# Patient Record
Sex: Female | Born: 1955 | Race: White | Hispanic: No | Smoking: Never smoker
Health system: Southern US, Community
[De-identification: ages and names within clinical notes are randomized; demographics above are authoritative.]

## PROBLEM LIST (undated history)

## (undated) DIAGNOSIS — I10 Essential (primary) hypertension: Secondary | ICD-10-CM

## (undated) DIAGNOSIS — L409 Psoriasis, unspecified: Secondary | ICD-10-CM

## (undated) HISTORY — DX: Essential (primary) hypertension: I10

## (undated) HISTORY — DX: Psoriasis, unspecified: L40.9

---

## 2007-03-28 ENCOUNTER — Ambulatory Visit: Payer: Self-pay | Admitting: Family Medicine

## 2007-03-28 DIAGNOSIS — L408 Other psoriasis: Secondary | ICD-10-CM | POA: Insufficient documentation

## 2007-03-28 DIAGNOSIS — I10 Essential (primary) hypertension: Secondary | ICD-10-CM

## 2007-03-28 HISTORY — DX: Other psoriasis: L40.8

## 2007-03-29 ENCOUNTER — Ambulatory Visit: Payer: Self-pay | Admitting: Family Medicine

## 2007-04-09 LAB — CONVERTED CEMR LAB
ALT: 45 units/L — ABNORMAL HIGH (ref 0–35)
AST: 38 units/L — ABNORMAL HIGH (ref 0–37)
Albumin: 3.9 g/dL (ref 3.5–5.2)
Bilirubin, Direct: 0.2 mg/dL (ref 0.0–0.3)
Calcium: 9.4 mg/dL (ref 8.4–10.5)
Chloride: 106 meq/L (ref 96–112)
Eosinophils Absolute: 0.3 10*3/uL (ref 0.0–0.6)
Eosinophils Relative: 5.2 % — ABNORMAL HIGH (ref 0.0–5.0)
GFR calc non Af Amer: 80 mL/min
Glucose, Bld: 99 mg/dL (ref 70–99)
MCV: 75.9 fL — ABNORMAL LOW (ref 78.0–100.0)
Platelets: 214 10*3/uL (ref 150–400)
RBC: 5.1 M/uL (ref 3.87–5.11)
Total CHOL/HDL Ratio: 4.6
Triglycerides: 83 mg/dL (ref 0–149)
VLDL: 17 mg/dL (ref 0–40)
WBC: 5.3 10*3/uL (ref 4.5–10.5)

## 2007-04-10 ENCOUNTER — Encounter: Payer: Self-pay | Admitting: Family Medicine

## 2007-04-10 ENCOUNTER — Ambulatory Visit: Payer: Self-pay

## 2007-04-13 ENCOUNTER — Ambulatory Visit: Payer: Self-pay | Admitting: Family Medicine

## 2007-09-24 ENCOUNTER — Ambulatory Visit: Payer: Self-pay | Admitting: Family Medicine

## 2007-10-03 LAB — CONVERTED CEMR LAB
Albumin: 3.7 g/dL (ref 3.5–5.2)
Bilirubin, Direct: 0.1 mg/dL (ref 0.0–0.3)
Calcium: 9 mg/dL (ref 8.4–10.5)
GFR calc Af Amer: 85 mL/min
GFR calc non Af Amer: 70 mL/min
GGT: 59 units/L — ABNORMAL HIGH (ref 7–51)
HDL: 23.7 mg/dL — ABNORMAL LOW (ref >39.0)
LDL Cholesterol: 85 mg/dL (ref 0–99)
Sodium: 137 meq/L (ref 135–145)
Total Bilirubin: 0.8 mg/dL (ref 0.3–1.2)
Total CHOL/HDL Ratio: 5.7
Total Protein: 7.6 g/dL (ref 6.0–8.3)
VLDL: 25 mg/dL (ref 0–40)

## 2007-10-08 ENCOUNTER — Encounter (INDEPENDENT_AMBULATORY_CARE_PROVIDER_SITE_OTHER): Payer: Self-pay | Admitting: *Deleted

## 2007-10-11 ENCOUNTER — Ambulatory Visit: Payer: Self-pay | Admitting: Family Medicine

## 2007-10-11 LAB — CONVERTED CEMR LAB
BUN: 16 mg/dL (ref 6–23)
Calcium: 8.8 mg/dL (ref 8.4–10.5)
Chloride: 105 meq/L (ref 96–112)
Creatinine, Ser: 0.9 mg/dL (ref 0.4–1.2)
GFR calc Af Amer: 85 mL/min
GFR calc non Af Amer: 70 mL/min

## 2007-12-24 ENCOUNTER — Telehealth (INDEPENDENT_AMBULATORY_CARE_PROVIDER_SITE_OTHER): Payer: Self-pay | Admitting: *Deleted

## 2008-01-10 ENCOUNTER — Encounter (INDEPENDENT_AMBULATORY_CARE_PROVIDER_SITE_OTHER): Payer: Self-pay | Admitting: *Deleted

## 2008-01-10 ENCOUNTER — Ambulatory Visit: Payer: Self-pay | Admitting: Family Medicine

## 2008-01-10 LAB — CONVERTED CEMR LAB
CO2: 27 meq/L (ref 19–32)
Calcium: 9.2 mg/dL (ref 8.4–10.5)
Creatinine, Ser: 0.9 mg/dL (ref 0.4–1.2)
GFR calc Af Amer: 85 mL/min

## 2008-07-10 ENCOUNTER — Ambulatory Visit: Payer: Self-pay | Admitting: Family Medicine

## 2008-07-22 ENCOUNTER — Encounter (INDEPENDENT_AMBULATORY_CARE_PROVIDER_SITE_OTHER): Payer: Self-pay | Admitting: *Deleted

## 2008-07-22 LAB — CONVERTED CEMR LAB
ALT: 26 units/L (ref 0–35)
Albumin: 3.8 g/dL (ref 3.5–5.2)
BUN: 15 mg/dL (ref 6–23)
Basophils Relative: 0.6 % (ref 0.0–3.0)
CO2: 28 meq/L (ref 19–32)
Chloride: 105 meq/L (ref 96–112)
Cholesterol: 140 mg/dL (ref 0–200)
Eosinophils Relative: 6 % — ABNORMAL HIGH (ref 0.0–5.0)
HCT: 37.4 % (ref 36.0–46.0)
LDL Cholesterol: 95 mg/dL (ref 0–99)
Lymphs Abs: 1.5 10*3/uL (ref 0.7–4.0)
MCV: 77.9 fL — ABNORMAL LOW (ref 78.0–100.0)
Monocytes Absolute: 0.3 10*3/uL (ref 0.1–1.0)
Potassium: 3.9 meq/L (ref 3.5–5.1)
RBC: 4.81 M/uL (ref 3.87–5.11)
TSH: 1.99 microintl units/mL (ref 0.35–5.50)
Total Protein: 7.6 g/dL (ref 6.0–8.3)
WBC: 5.2 10*3/uL (ref 4.5–10.5)

## 2008-11-03 ENCOUNTER — Ambulatory Visit: Payer: Self-pay | Admitting: Family Medicine

## 2008-11-03 ENCOUNTER — Telehealth: Payer: Self-pay | Admitting: Internal Medicine

## 2008-11-03 DIAGNOSIS — K5289 Other specified noninfective gastroenteritis and colitis: Secondary | ICD-10-CM | POA: Insufficient documentation

## 2008-11-10 ENCOUNTER — Telehealth: Payer: Self-pay | Admitting: Family Medicine

## 2008-11-10 LAB — CONVERTED CEMR LAB
ALT: 31 units/L (ref 0–35)
AST: 36 units/L (ref 0–37)
Bilirubin, Direct: 0.3 mg/dL (ref 0.0–0.3)
Eosinophils Relative: 1 % (ref 0.0–5.0)
Monocytes Absolute: 0.2 10*3/uL (ref 0.1–1.0)
Monocytes Relative: 9 % (ref 3.0–12.0)
Neutrophils Relative %: 72 % (ref 43.0–77.0)
Platelets: 125 10*3/uL — ABNORMAL LOW (ref 150.0–400.0)
Total Bilirubin: 1.8 mg/dL — ABNORMAL HIGH (ref 0.3–1.2)
WBC: 4.5 10*3/uL (ref 4.5–10.5)

## 2009-01-09 ENCOUNTER — Ambulatory Visit: Payer: Self-pay | Admitting: Family Medicine

## 2009-01-09 DIAGNOSIS — L509 Urticaria, unspecified: Secondary | ICD-10-CM

## 2009-01-16 LAB — CONVERTED CEMR LAB
ALT: 38 units/L — ABNORMAL HIGH (ref 0–35)
Bilirubin, Direct: 0.1 mg/dL (ref 0.0–0.3)
Chloride: 103 meq/L (ref 96–112)
Creatinine, Ser: 0.9 mg/dL (ref 0.4–1.2)
GFR calc non Af Amer: 69.39 mL/min (ref >60)
LDL Cholesterol: 106 mg/dL — ABNORMAL HIGH (ref 0–99)
Total Bilirubin: 1.1 mg/dL (ref 0.3–1.2)
Total CHOL/HDL Ratio: 5
Triglycerides: 123 mg/dL (ref 0.0–149.0)

## 2010-03-01 ENCOUNTER — Telehealth (INDEPENDENT_AMBULATORY_CARE_PROVIDER_SITE_OTHER): Payer: Self-pay | Admitting: *Deleted

## 2010-03-25 NOTE — Progress Notes (Signed)
Summary: RX  Phone Note Refill Request Call back at Home Phone [phone] Message from:  Patient  Refills Requested: Medication #1:  LISINOPRIL-HYDROCHLOROTHIAZIDE 10-12.5 MG  TABS 1 by mouth once daily   Dosage confirmed as above?Dosage Confirmed   Supply Requested: 3 months [facility] [address]  Initial call taken by: Freddy Jaksch,  March 01, 2010 3:02 PM    New/Updated Medications: LISINOPRIL-HYDROCHLOROTHIAZIDE 10-12.5 MG  TABS (LISINOPRIL-HYDROCHLOROTHIAZIDE) 1 by mouth once daily** Office Visit due Now_no nore meds until seen** Prescriptions: LISINOPRIL-HYDROCHLOROTHIAZIDE 10-12.5 MG  TABS (LISINOPRIL-HYDROCHLOROTHIAZIDE) 1 by mouth once daily** Office Visit due Now_no nore meds until seen**  #30 x 0   Entered by:   Almeta Monas CMA (AAMA)   Authorized by:   Loreen Freud DO   Signed by:   Almeta Monas CMA (AAMA) on 03/01/2010   Method used:   Faxed to ...       [facility] # [id number]* (retail)       [address]       [place], [place]  [address]       Ph: [phone]       Fax: [phone]   RxID:   [id number]

## 2010-04-02 ENCOUNTER — Encounter: Payer: Self-pay | Admitting: Family Medicine

## 2010-04-02 ENCOUNTER — Ambulatory Visit (INDEPENDENT_AMBULATORY_CARE_PROVIDER_SITE_OTHER): Payer: PRIVATE HEALTH INSURANCE | Admitting: Family Medicine

## 2010-04-02 DIAGNOSIS — I10 Essential (primary) hypertension: Secondary | ICD-10-CM

## 2010-04-05 LAB — CONVERTED CEMR LAB
Bilirubin, Direct: 0.1 mg/dL (ref 0.0–0.3)
CO2: 27 meq/L (ref 19–32)
Chloride: 101 meq/L (ref 96–112)
Glucose, Bld: 83 mg/dL (ref 70–99)
LDL Cholesterol: 85 mg/dL (ref 0–99)
Potassium: 4 meq/L (ref 3.5–5.3)
Sodium: 135 meq/L (ref 135–145)
Total Bilirubin: 0.5 mg/dL (ref 0.3–1.2)
Total CHOL/HDL Ratio: 4.9
VLDL: 28 mg/dL (ref 0–40)

## 2010-04-08 NOTE — Assessment & Plan Note (Signed)
Summary: med refill   Vital Signs:  Patient profile:   55 year old female Height:      67.75 inches Weight:      269 pounds BMI:     41.35 Pulse rate:   78 / minute Pulse rhythm:   regular BP sitting:   126 / 82  (left arm) Cuff size:   large  Vitals Entered By: Army Fossa CMA (April 02, 2010 4:03 PM) CC: Med refill on Lisionpril-HCTZ. [facility]   History of Present Illness:  Hypertension follow-up      This is a 55 year old woman who presents for Hypertension follow-up.  The patient denies lightheadedness, urinary frequency, headaches, edema, impotence, rash, and fatigue.  The patient denies the following associated symptoms: chest pain, chest pressure, exercise intolerance, dyspnea, palpitations, syncope, leg edema, and pedal edema.  Compliance with medications (by patient report) has been near 100%.  The patient reports that dietary compliance has been fair.  The patient reports no exercise.  Adjunctive measures currently used by the patient include salt restriction.    Current Medications (verified): 1)  Lisinopril-Hydrochlorothiazide 10-12.5 Mg  Tabs (Lisinopril-Hydrochlorothiazide) .Marland Kitchen.. 1 By Mouth Once Daily  Allergies (verified): No Known Drug Allergies  Past History:  Past Medical History: Last updated: 03/28/2007 psoriasis Hypertension  Past Surgical History: Last updated: 03/28/2007 Denies surgical history  Family History: Last updated: 03/28/2007 Family History Diabetes 1st degree relative Family History of Colon CA 1st degree relative <60 Family History Lung cancer Family History of Stroke F 1st degree relative in 37s---  mother F- bladder CA Family History Hypertension MGF-- MI at 74 Family History Thyroid disease-- hypothyroid   Social History: Last updated: 03/28/2007 Occupation:  lab tech-- sterile med- repairing endoscopes Married-- widowed Never Smoked Alcohol use-no Drug use-no Regular exercise-no  Risk Factors: Caffeine  Use: 2 (03/28/2007) Exercise: no (03/28/2007)  Risk Factors: Smoking Status: never (03/28/2007) Passive Smoke Exposure: no (03/28/2007)  Family History: Reviewed history from 03/28/2007 and no changes required. Family History Diabetes 1st degree relative Family History of Colon CA 1st degree relative <60 Family History Lung cancer Family History of Stroke F 1st degree relative in 78s---  mother F- bladder CA Family History Hypertension MGF-- MI at 15 Family History Thyroid disease-- hypothyroid   Social History: Reviewed history from 03/28/2007 and no changes required. Occupation:  Designer, industrial/product-- sterile med- repairing endoscopes Married-- widowed Never Smoked Alcohol use-no Drug use-no Regular exercise-no  Review of Systems      See HPI  Physical Exam  General:  Well-developed,well-nourished,in no acute distress; alert,appropriate and cooperative throughout examination Lungs:  Normal respiratory effort, chest expands symmetrically. Lungs are clear to auscultation, no crackles or wheezes. Heart:  normal rate and no murmur.   Extremities:  No clubbing, cyanosis, edema, or deformity noted with normal full range of motion of all joints.   Psych:  Cognition and judgment appear intact. Alert and cooperative with normal attention span and concentration. No apparent delusions, illusions, hallucinations   Impression & Recommendations:  Problem # 1:  HYPERTENSION (ICD-401.9)  Her updated medication list for this problem includes:    Lisinopril-hydrochlorothiazide 10-12.5 Mg Tabs (Lisinopril-hydrochlorothiazide) .Marland Kitchen... 1 by mouth once daily  Orders: Venipuncture ([address]) TLB-Lipid Panel (80061-LIPID) TLB-BMP (Basic Metabolic Panel-BMET) (80048-METABOL) TLB-Hepatic/Liver Function Pnl (80076-HEPATIC)  BP today: 126/82 Prior BP: 118/80 (01/09/2009)  Labs Reviewed: K+: 4.0 (01/09/2009) Creat: : 0.9 (01/09/2009)   Chol: 161 (01/09/2009)   HDL: 30.70 (01/09/2009)   LDL: 106  (01/09/2009)   TG:  123.0 (01/09/2009)  Complete Medication List: 1)  Lisinopril-hydrochlorothiazide 10-12.5 Mg Tabs (Lisinopril-hydrochlorothiazide) .Marland Kitchen.. 1 by mouth once daily  Patient Instructions: 1)  rto cpe  Prescriptions: LISINOPRIL-HYDROCHLOROTHIAZIDE 10-12.5 MG  TABS (LISINOPRIL-HYDROCHLOROTHIAZIDE) 1 by mouth once daily  #90 x 3   Entered and Authorized by:   Loreen Freud DO   Signed by:   Loreen Freud DO on 04/02/2010   Method used:   Electronically to        [id number] (retail)       [address]       [place], [place]  [address]       Ph: [phone]       Fax: [phone]   RxID:   [id number]    Orders Added: 1)  Venipuncture [[address]] 2)  TLB-Lipid Panel [80061-LIPID] 3)  TLB-BMP (Basic Metabolic Panel-BMET) [80048-METABOL] 4)  TLB-Hepatic/Liver Function Pnl [80076-HEPATIC] 5)  Est. Patient Level III [[address]]  Appended Document: med refill

## 2011-04-01 ENCOUNTER — Other Ambulatory Visit: Payer: Self-pay

## 2011-04-01 MED ORDER — LISINOPRIL-HYDROCHLOROTHIAZIDE 10-12.5 MG PO TABS: 1.0000 | ORAL_TABLET | Freq: Every day | ORAL | Status: DC

## 2011-04-01 NOTE — Telephone Encounter (Signed)
Apt scheduled    KP 

## 2011-05-11 ENCOUNTER — Ambulatory Visit (INDEPENDENT_AMBULATORY_CARE_PROVIDER_SITE_OTHER): Payer: PRIVATE HEALTH INSURANCE | Admitting: Family Medicine

## 2011-05-11 ENCOUNTER — Encounter: Payer: Self-pay | Admitting: Family Medicine

## 2011-05-11 VITALS — BP 120/86 | HR 79 | Temp 97.7°F | Ht 66.25 in | Wt 275.0 lb

## 2011-05-11 DIAGNOSIS — M1990 Unspecified osteoarthritis, unspecified site: Secondary | ICD-10-CM

## 2011-05-11 DIAGNOSIS — I10 Essential (primary) hypertension: Secondary | ICD-10-CM

## 2011-05-11 DIAGNOSIS — Z0000 Encounter for general adult medical examination without abnormal findings: Secondary | ICD-10-CM

## 2011-05-11 DIAGNOSIS — R319 Hematuria, unspecified: Secondary | ICD-10-CM

## 2011-05-11 LAB — BASIC METABOLIC PANEL
BUN: 19 mg/dL (ref 6–23)
CO2: 24 mEq/L (ref 19–32)
Calcium: 8.8 mg/dL (ref 8.4–10.5)
Chloride: 101 mEq/L (ref 96–112)
Creatinine, Ser: 0.8 mg/dL (ref 0.4–1.2)
Glucose, Bld: 123 mg/dL — ABNORMAL HIGH (ref 70–99)

## 2011-05-11 LAB — CBC WITH DIFFERENTIAL/PLATELET
Basophils Absolute: 0 10*3/uL (ref 0.0–0.1)
Basophils Relative: 0.5 % (ref 0.0–3.0)
Eosinophils Absolute: 0.4 10*3/uL (ref 0.0–0.7)
Lymphocytes Relative: 21.3 % (ref 12.0–46.0)
MCHC: 32.7 g/dL (ref 30.0–36.0)
MCV: 73.1 fl — ABNORMAL LOW (ref 78.0–100.0)
Monocytes Absolute: 0.3 10*3/uL (ref 0.1–1.0)
Neutrophils Relative %: 61.8 % (ref 43.0–77.0)
Platelets: 197 10*3/uL (ref 150.0–400.0)
RDW: 19.2 % — ABNORMAL HIGH (ref 11.5–14.6)

## 2011-05-11 LAB — POCT URINALYSIS DIPSTICK
Bilirubin, UA: NEGATIVE
Nitrite, UA: NEGATIVE
Protein, UA: NEGATIVE
pH, UA: 6

## 2011-05-11 LAB — LIPID PANEL
HDL: 37.8 mg/dL — ABNORMAL LOW (ref >39.00)
Total CHOL/HDL Ratio: 4
Triglycerides: 72 mg/dL (ref 0.0–149.0)
VLDL: 14.4 mg/dL (ref 0.0–40.0)

## 2011-05-11 LAB — HEPATIC FUNCTION PANEL
Alkaline Phosphatase: 102 U/L (ref 39–117)
Bilirubin, Direct: 0.1 mg/dL (ref 0.0–0.3)
Total Bilirubin: 0.5 mg/dL (ref 0.3–1.2)
Total Protein: 8 g/dL (ref 6.0–8.3)

## 2011-05-11 MED ORDER — DICLOFENAC SODIUM 1.5 % TD SOLN: 40.0000 [drp] | Freq: Four times a day (QID) | TRANSDERMAL | Status: DC | PRN

## 2011-05-11 MED ORDER — LISINOPRIL-HYDROCHLOROTHIAZIDE 10-12.5 MG PO TABS
1.0000 | ORAL_TABLET | Freq: Every day | ORAL | Status: DC
Start: 2011-05-11 — End: 2012-05-13

## 2011-05-11 NOTE — Progress Notes (Signed)
Addended by: Silvio Pate D on: 05/11/2011 12:03 PM   Modules accepted: Orders

## 2011-05-11 NOTE — Patient Instructions (Addendum)
Preventive Care for Adults, Female A healthy lifestyle and preventive care can promote health and wellness. Preventive health guidelines for women include the following key practices.  A routine yearly physical is a good way to check with your caregiver about your health and preventive screening. It is a chance to share any concerns and updates on your health, and to receive a thorough exam.   Visit your dentist for a routine exam and preventive care every 6 months. Brush your teeth twice a day and floss once a day. Good oral hygiene prevents tooth decay and gum disease.   The frequency of eye exams is based on your age, health, family medical history, use of contact lenses, and other factors. Follow your caregiver's recommendations for frequency of eye exams.   Eat a healthy diet. Foods like vegetables, fruits, whole grains, low-fat dairy products, and lean protein foods contain the nutrients you need without too many calories. Decrease your intake of foods high in solid fats, added sugars, and salt. Eat the right amount of calories for you.Get information about a proper diet from your caregiver, if necessary.   Regular physical exercise is one of the most important things you can do for your health. Most adults should get at least 150 minutes of moderate-intensity exercise (any activity that increases your heart rate and causes you to sweat) each week. In addition, most adults need muscle-strengthening exercises on 2 or more days a week.   Maintain a healthy weight. The body mass index (BMI) is a screening tool to identify possible weight problems. It provides an estimate of body fat based on height and weight. Your caregiver can help determine your BMI, and can help you achieve or maintain a healthy weight.For adults 20 years and older:   A BMI below 18.5 is considered underweight.   A BMI of 18.5 to 24.9 is normal.   A BMI of 25 to 29.9 is considered overweight.   A BMI of 30 and above is  considered obese.   Maintain normal blood lipids and cholesterol levels by exercising and minimizing your intake of saturated fat. Eat a balanced diet with plenty of fruit and vegetables. Blood tests for lipids and cholesterol should begin at age 20 and be repeated every 5 years. If your lipid or cholesterol levels are high, you are over 50, or you are at high risk for heart disease, you may need your cholesterol levels checked more frequently.Ongoing high lipid and cholesterol levels should be treated with medicines if diet and exercise are not effective.   If you smoke, find out from your caregiver how to quit. If you do not use tobacco, do not start.   If you are pregnant, do not drink alcohol. If you are breastfeeding, be very cautious about drinking alcohol. If you are not pregnant and choose to drink alcohol, do not exceed 1 drink per day. One drink is considered to be 12 ounces (355 mL) of beer, 5 ounces (148 mL) of wine, or 1.5 ounces (44 mL) of liquor.   Avoid use of street drugs. Do not share needles with anyone. Ask for help if you need support or instructions about stopping the use of drugs.   High blood pressure causes heart disease and increases the risk of stroke. Your blood pressure should be checked at least every 1 to 2 years. Ongoing high blood pressure should be treated with medicines if weight loss and exercise are not effective.   If you are 55 to 56   years old, ask your caregiver if you should take aspirin to prevent strokes.   Diabetes screening involves taking a blood sample to check your fasting blood sugar level. This should be done once every 3 years, after age 45, if you are within normal weight and without risk factors for diabetes. Testing should be considered at a younger age or be carried out more frequently if you are overweight and have at least 1 risk factor for diabetes.   Breast cancer screening is essential preventive care for women. You should practice "breast  self-awareness." This means understanding the normal appearance and feel of your breasts and may include breast self-examination. Any changes detected, no matter how small, should be reported to a caregiver. Women in their 20s and 30s should have a clinical breast exam (CBE) by a caregiver as part of a regular health exam every 1 to 3 years. After age 40, women should have a CBE every year. Starting at age 40, women should consider having a mammography (breast X-ray test) every year. Women who have a family history of breast cancer should talk to their caregiver about genetic screening. Women at a high risk of breast cancer should talk to their caregivers about having magnetic resonance imaging (MRI) and a mammography every year.   The Pap test is a screening test for cervical cancer. A Pap test can show cell changes on the cervix that might become cervical cancer if left untreated. A Pap test is a procedure in which cells are obtained and examined from the lower end of the uterus (cervix).   Women should have a Pap test starting at age 21.   Between ages 21 and 29, Pap tests should be repeated every 2 years.   Beginning at age 30, you should have a Pap test every 3 years as long as the past 3 Pap tests have been normal.   Some women have medical problems that increase the chance of getting cervical cancer. Talk to your caregiver about these problems. It is especially important to talk to your caregiver if a new problem develops soon after your last Pap test. In these cases, your caregiver may recommend more frequent screening and Pap tests.   The above recommendations are the same for women who have or have not gotten the vaccine for human papillomavirus (HPV).   If you had a hysterectomy for a problem that was not cancer or a condition that could lead to cancer, then you no longer need Pap tests. Even if you no longer need a Pap test, a regular exam is a good idea to make sure no other problems are  starting.   If you are between ages 65 and 70, and you have had normal Pap tests going back 10 years, you no longer need Pap tests. Even if you no longer need a Pap test, a regular exam is a good idea to make sure no other problems are starting.   If you have had past treatment for cervical cancer or a condition that could lead to cancer, you need Pap tests and screening for cancer for at least 20 years after your treatment.   If Pap tests have been discontinued, risk factors (such as a new sexual partner) need to be reassessed to determine if screening should be resumed.   The HPV test is an additional test that may be used for cervical cancer screening. The HPV test looks for the virus that can cause the cell changes on the cervix.   The cells collected during the Pap test can be tested for HPV. The HPV test could be used to screen women aged 30 years and older, and should be used in women of any age who have unclear Pap test results. After the age of 30, women should have HPV testing at the same frequency as a Pap test.   Colorectal cancer can be detected and often prevented. Most routine colorectal cancer screening begins at the age of 50 and continues through age 75. However, your caregiver may recommend screening at an earlier age if you have risk factors for colon cancer. On a yearly basis, your caregiver may provide home test kits to check for hidden blood in the stool. Use of a small camera at the end of a tube, to directly examine the colon (sigmoidoscopy or colonoscopy), can detect the earliest forms of colorectal cancer. Talk to your caregiver about this at age 50, when routine screening begins. Direct examination of the colon should be repeated every 5 to 10 years through age 75, unless early forms of pre-cancerous polyps or small growths are found.   Hepatitis C blood testing is recommended for all people born from 1945 through 1965 and any individual with known risks for hepatitis C.    Practice safe sex. Use condoms and avoid high-risk sexual practices to reduce the spread of sexually transmitted infections (STIs). STIs include gonorrhea, chlamydia, syphilis, trichomonas, herpes, HPV, and human immunodeficiency virus (HIV). Herpes, HIV, and HPV are viral illnesses that have no cure. They can result in disability, cancer, and death. Sexually active women aged 25 and younger should be checked for chlamydia. Older women with new or multiple partners should also be tested for chlamydia. Testing for other STIs is recommended if you are sexually active and at increased risk.   Osteoporosis is a disease in which the bones lose minerals and strength with aging. This can result in serious bone fractures. The risk of osteoporosis can be identified using a bone density scan. Women ages 65 and over and women at risk for fractures or osteoporosis should discuss screening with their caregivers. Ask your caregiver whether you should take a calcium supplement or vitamin D to reduce the rate of osteoporosis.   Menopause can be associated with physical symptoms and risks. Hormone replacement therapy is available to decrease symptoms and risks. You should talk to your caregiver about whether hormone replacement therapy is right for you.   Use sunscreen with sun protection factor (SPF) of 30 or more. Apply sunscreen liberally and repeatedly throughout the day. You should seek shade when your shadow is shorter than you. Protect yourself by wearing long sleeves, pants, a wide-brimmed hat, and sunglasses year round, whenever you are outdoors.   Once a month, do a whole body skin exam, using a mirror to look at the skin on your back. Notify your caregiver of new moles, moles that have irregular borders, moles that are larger than a pencil eraser, or moles that have changed in shape or color.   Stay current with required immunizations.   Influenza. You need a dose every fall (or winter). The composition of  the flu vaccine changes each year, so being vaccinated once is not enough.   Pneumococcal polysaccharide. You need 1 to 2 doses if you smoke cigarettes or if you have certain chronic medical conditions. You need 1 dose at age 65 (or older) if you have never been vaccinated.   Tetanus, diphtheria, pertussis (Tdap, Td). Get 1 dose of   Tdap vaccine if you are younger than age 65, are over 65 and have contact with an infant, are a healthcare worker, are pregnant, or simply want to be protected from whooping cough. After that, you need a Td booster dose every 10 years. Consult your caregiver if you have not had at least 3 tetanus and diphtheria-containing shots sometime in your life or have a deep or dirty wound.   HPV. You need this vaccine if you are a woman age 26 or younger. The vaccine is given in 3 doses over 6 months.   Measles, mumps, rubella (MMR). You need at least 1 dose of MMR if you were born in 1957 or later. You may also need a second dose.   Meningococcal. If you are age 19 to 21 and a first-year college student living in a residence hall, or have one of several medical conditions, you need to get vaccinated against meningococcal disease. You may also need additional booster doses.   Zoster (shingles). If you are age 60 or older, you should get this vaccine.   Varicella (chickenpox). If you have never had chickenpox or you were vaccinated but received only 1 dose, talk to your caregiver to find out if you need this vaccine.   Hepatitis A. You need this vaccine if you have a specific risk factor for hepatitis A virus infection or you simply wish to be protected from this disease. The vaccine is usually given as 2 doses, 6 to 18 months apart.   Hepatitis B. You need this vaccine if you have a specific risk factor for hepatitis B virus infection or you simply wish to be protected from this disease. The vaccine is given in 3 doses, usually over 6 months.  Preventive Services /  Frequency Ages 19 to 39  Blood pressure check.** / Every 1 to 2 years.   Lipid and cholesterol check.** / Every 5 years beginning at age 20.   Clinical breast exam.** / Every 3 years for women in their 20s and 30s.   Pap test.** / Every 2 years from ages 21 through 29. Every 3 years starting at age 30 through age 65 or 70 with a history of 3 consecutive normal Pap tests.   HPV screening.** / Every 3 years from ages 30 through ages 65 to 70 with a history of 3 consecutive normal Pap tests.   Hepatitis C blood test.** / For any individual with known risks for hepatitis C.   Skin self-exam. / Monthly.   Influenza immunization.** / Every year.   Pneumococcal polysaccharide immunization.** / 1 to 2 doses if you smoke cigarettes or if you have certain chronic medical conditions.   Tetanus, diphtheria, pertussis (Tdap, Td) immunization. / A one-time dose of Tdap vaccine. After that, you need a Td booster dose every 10 years.   HPV immunization. / 3 doses over 6 months, if you are 26 and younger.   Measles, mumps, rubella (MMR) immunization. / You need at least 1 dose of MMR if you were born in 1957 or later. You may also need a second dose.   Meningococcal immunization. / 1 dose if you are age 19 to 21 and a first-year college student living in a residence hall, or have one of several medical conditions, you need to get vaccinated against meningococcal disease. You may also need additional booster doses.   Varicella immunization.** / Consult your caregiver.   Hepatitis A immunization.** / Consult your caregiver. 2 doses, 6 to 18 months   apart.   Hepatitis B immunization.** / Consult your caregiver. 3 doses usually over 6 months.  Ages 40 to 64  Blood pressure check.** / Every 1 to 2 years.   Lipid and cholesterol check.** / Every 5 years beginning at age 20.   Clinical breast exam.** / Every year after age 40.   Mammogram.** / Every year beginning at age 40 and continuing for as  long as you are in good health. Consult with your caregiver.   Pap test.** / Every 3 years starting at age 30 through age 65 or 70 with a history of 3 consecutive normal Pap tests.   HPV screening.** / Every 3 years from ages 30 through ages 65 to 70 with a history of 3 consecutive normal Pap tests.   Fecal occult blood test (FOBT) of stool. / Every year beginning at age 50 and continuing until age 75. You may not need to do this test if you get a colonoscopy every 10 years.   Flexible sigmoidoscopy or colonoscopy.** / Every 5 years for a flexible sigmoidoscopy or every 10 years for a colonoscopy beginning at age 50 and continuing until age 75.   Hepatitis C blood test.** / For all people born from 1945 through 1965 and any individual with known risks for hepatitis C.   Skin self-exam. / Monthly.   Influenza immunization.** / Every year.   Pneumococcal polysaccharide immunization.** / 1 to 2 doses if you smoke cigarettes or if you have certain chronic medical conditions.   Tetanus, diphtheria, pertussis (Tdap, Td) immunization.** / A one-time dose of Tdap vaccine. After that, you need a Td booster dose every 10 years.   Measles, mumps, rubella (MMR) immunization. / You need at least 1 dose of MMR if you were born in 1957 or later. You may also need a second dose.   Varicella immunization.** / Consult your caregiver.   Meningococcal immunization.** / Consult your caregiver.   Hepatitis A immunization.** / Consult your caregiver. 2 doses, 6 to 18 months apart.   Hepatitis B immunization.** / Consult your caregiver. 3 doses, usually over 6 months.  Ages 65 and over  Blood pressure check.** / Every 1 to 2 years.   Lipid and cholesterol check.** / Every 5 years beginning at age 20.   Clinical breast exam.** / Every year after age 40.   Mammogram.** / Every year beginning at age 40 and continuing for as long as you are in good health. Consult with your caregiver.   Pap test.** /  Every 3 years starting at age 30 through age 65 or 70 with a 3 consecutive normal Pap tests. Testing can be stopped between 65 and 70 with 3 consecutive normal Pap tests and no abnormal Pap or HPV tests in the past 10 years.   HPV screening.** / Every 3 years from ages 30 through ages 65 or 70 with a history of 3 consecutive normal Pap tests. Testing can be stopped between 65 and 70 with 3 consecutive normal Pap tests and no abnormal Pap or HPV tests in the past 10 years.   Fecal occult blood test (FOBT) of stool. / Every year beginning at age 50 and continuing until age 75. You may not need to do this test if you get a colonoscopy every 10 years.   Flexible sigmoidoscopy or colonoscopy.** / Every 5 years for a flexible sigmoidoscopy or every 10 years for a colonoscopy beginning at age 50 and continuing until age 75.   Hepatitis   C blood test.** / For all people born from [birth year] through 1965 and any individual with known risks for hepatitis C.   Osteoporosis screening.** / A one-time screening for women ages 88 and over and women at risk for fractures or osteoporosis.   Skin self-exam. / Monthly.   Influenza immunization.** / Every year.   Pneumococcal polysaccharide immunization.** / 1 dose at age 45 (or older) if you have never been vaccinated.   Tetanus, diphtheria, pertussis (Tdap, Td) immunization. / A one-time dose of Tdap vaccine if you are over 65 and have contact with an infant, are a Research scientist (physical sciences), or simply want to be protected from whooping cough. After that, you need a Td booster dose every 10 years.   Varicella immunization.** / Consult your caregiver.   Meningococcal immunization.** / Consult your caregiver.   Hepatitis A immunization.** / Consult your caregiver. 2 doses, 6 to 18 months apart.   Hepatitis B immunization.** / Check with your caregiver. 3 doses, usually over 6 months.  ** Family history and personal history of risk and conditions may change your caregiver's  recommendations. Document Released: 04/05/2001 Document Revised: 01/27/2011 Document Reviewed: 07/05/2010 [facility] Patient Information 2012 [place], [place].Preventive Care for Adults, Female A healthy lifestyle and preventive care can promote health and wellness. Preventive health guidelines for women include the following key practices.  A routine yearly physical is a good way to check with your caregiver about your health and preventive screening. It is a chance to share any concerns and updates on your health, and to receive a thorough exam.   Visit your dentist for a routine exam and preventive care every 6 months. Brush your teeth twice a day and floss once a day. Good oral hygiene prevents tooth decay and gum disease.   The frequency of eye exams is based on your age, health, family medical history, use of contact lenses, and other factors. Follow your caregiver's recommendations for frequency of eye exams.   Eat a healthy diet. Foods like vegetables, fruits, whole grains, low-fat dairy products, and lean protein foods contain the nutrients you need without too many calories. Decrease your intake of foods high in solid fats, added sugars, and salt. Eat the right amount of calories for you.Get information about a proper diet from your caregiver, if necessary.   Regular physical exercise is one of the most important things you can do for your health. Most adults should get at least 150 minutes of moderate-intensity exercise (any activity that increases your heart rate and causes you to sweat) each week. In addition, most adults need muscle-strengthening exercises on 2 or more days a week.   Maintain a healthy weight. The body mass index (BMI) is a screening tool to identify possible weight problems. It provides an estimate of body fat based on height and weight. Your caregiver can help determine your BMI, and can help you achieve or maintain a healthy weight.For adults 20 years and older:   A  BMI below 18.5 is considered underweight.   A BMI of 18.5 to 24.9 is normal.   A BMI of 25 to 29.9 is considered overweight.   A BMI of 30 and above is considered obese.   Maintain normal blood lipids and cholesterol levels by exercising and minimizing your intake of saturated fat. Eat a balanced diet with plenty of fruit and vegetables. Blood tests for lipids and cholesterol should begin at age 58 and be repeated every 5 years. If your lipid or cholesterol levels are  high, you are over 50, or you are at high risk for heart disease, you may need your cholesterol levels checked more frequently.Ongoing high lipid and cholesterol levels should be treated with medicines if diet and exercise are not effective.   If you smoke, find out from your caregiver how to quit. If you do not use tobacco, do not start.   If you are pregnant, do not drink alcohol. If you are breastfeeding, be very cautious about drinking alcohol. If you are not pregnant and choose to drink alcohol, do not exceed 1 drink per day. One drink is considered to be 12 ounces (355 mL) of beer, 5 ounces (148 mL) of wine, or 1.5 ounces (44 mL) of liquor.   Avoid use of street drugs. Do not share needles with anyone. Ask for help if you need support or instructions about stopping the use of drugs.   High blood pressure causes heart disease and increases the risk of stroke. Your blood pressure should be checked at least every 1 to 2 years. Ongoing high blood pressure should be treated with medicines if weight loss and exercise are not effective.   If you are 35 to 56 years old, ask your caregiver if you should take aspirin to prevent strokes.   Diabetes screening involves taking a blood sample to check your fasting blood sugar level. This should be done once every 3 years, after age 43, if you are within normal weight and without risk factors for diabetes. Testing should be considered at a younger age or be carried out more frequently if you  are overweight and have at least 1 risk factor for diabetes.   Breast cancer screening is essential preventive care for women. You should practice "breast self-awareness." This means understanding the normal appearance and feel of your breasts and may include breast self-examination. Any changes detected, no matter how small, should be reported to a caregiver. Women in their 49s and 30s should have a clinical breast exam (CBE) by a caregiver as part of a regular health exam every 1 to 3 years. After age 3, women should have a CBE every year. Starting at age 12, women should consider having a mammography (breast X-ray test) every year. Women who have a family history of breast cancer should talk to their caregiver about genetic screening. Women at a high risk of breast cancer should talk to their caregivers about having magnetic resonance imaging (MRI) and a mammography every year.   The Pap test is a screening test for cervical cancer. A Pap test can show cell changes on the cervix that might become cervical cancer if left untreated. A Pap test is a procedure in which cells are obtained and examined from the lower end of the uterus (cervix).   Women should have a Pap test starting at age 60.   Between ages 86 and 61, Pap tests should be repeated every 2 years.   Beginning at age 36, you should have a Pap test every 3 years as long as the past 3 Pap tests have been normal.   Some women have medical problems that increase the chance of getting cervical cancer. Talk to your caregiver about these problems. It is especially important to talk to your caregiver if a new problem develops soon after your last Pap test. In these cases, your caregiver may recommend more frequent screening and Pap tests.   The above recommendations are the same for women who have or have not gotten the vaccine  for human papillomavirus (HPV).   If you had a hysterectomy for a problem that was not cancer or a condition that could  lead to cancer, then you no longer need Pap tests. Even if you no longer need a Pap test, a regular exam is a good idea to make sure no other problems are starting.   If you are between ages 29 and 48, and you have had normal Pap tests going back 10 years, you no longer need Pap tests. Even if you no longer need a Pap test, a regular exam is a good idea to make sure no other problems are starting.   If you have had past treatment for cervical cancer or a condition that could lead to cancer, you need Pap tests and screening for cancer for at least 20 years after your treatment.   If Pap tests have been discontinued, risk factors (such as a new sexual partner) need to be reassessed to determine if screening should be resumed.   The HPV test is an additional test that may be used for cervical cancer screening. The HPV test looks for the virus that can cause the cell changes on the cervix. The cells collected during the Pap test can be tested for HPV. The HPV test could be used to screen women aged 68 years and older, and should be used in women of any age who have unclear Pap test results. After the age of 32, women should have HPV testing at the same frequency as a Pap test.   Colorectal cancer can be detected and often prevented. Most routine colorectal cancer screening begins at the age of 52 and continues through age 4. However, your caregiver may recommend screening at an earlier age if you have risk factors for colon cancer. On a yearly basis, your caregiver may provide home test kits to check for hidden blood in the stool. Use of a small camera at the end of a tube, to directly examine the colon (sigmoidoscopy or colonoscopy), can detect the earliest forms of colorectal cancer. Talk to your caregiver about this at age 53, when routine screening begins. Direct examination of the colon should be repeated every 5 to 10 years through age 52, unless early forms of pre-cancerous polyps or small growths are  found.   Hepatitis C blood testing is recommended for all people born from [birth year] through 1965 and any individual with known risks for hepatitis C.   Practice safe sex. Use condoms and avoid high-risk sexual practices to reduce the spread of sexually transmitted infections (STIs). STIs include gonorrhea, chlamydia, syphilis, trichomonas, herpes, HPV, and human immunodeficiency virus (HIV). Herpes, HIV, and HPV are viral illnesses that have no cure. They can result in disability, cancer, and death. Sexually active women aged 45 and younger should be checked for chlamydia. Older women with new or multiple partners should also be tested for chlamydia. Testing for other STIs is recommended if you are sexually active and at increased risk.   Osteoporosis is a disease in which the bones lose minerals and strength with aging. This can result in serious bone fractures. The risk of osteoporosis can be identified using a bone density scan. Women ages 50 and over and women at risk for fractures or osteoporosis should discuss screening with their caregivers. Ask your caregiver whether you should take a calcium supplement or vitamin D to reduce the rate of osteoporosis.   Menopause can be associated with physical symptoms and risks. Hormone replacement therapy is  available to decrease symptoms and risks. You should talk to your caregiver about whether hormone replacement therapy is right for you.   Use sunscreen with sun protection factor (SPF) of 30 or more. Apply sunscreen liberally and repeatedly throughout the day. You should seek shade when your shadow is shorter than you. Protect yourself by wearing long sleeves, pants, a wide-brimmed hat, and sunglasses year round, whenever you are outdoors.   Once a month, do a whole body skin exam, using a mirror to look at the skin on your back. Notify your caregiver of new moles, moles that have irregular borders, moles that are larger than a pencil eraser, or moles that  have changed in shape or color.   Stay current with required immunizations.   Influenza. You need a dose every fall (or winter). The composition of the flu vaccine changes each year, so being vaccinated once is not enough.   Pneumococcal polysaccharide. You need 1 to 2 doses if you smoke cigarettes or if you have certain chronic medical conditions. You need 1 dose at age 35 (or older) if you have never been vaccinated.   Tetanus, diphtheria, pertussis (Tdap, Td). Get 1 dose of Tdap vaccine if you are younger than age 6, are over 67 and have contact with an infant, are a Research scientist (physical sciences), are pregnant, or simply want to be protected from whooping cough. After that, you need a Td booster dose every 10 years. Consult your caregiver if you have not had at least 3 tetanus and diphtheria-containing shots sometime in your life or have a deep or dirty wound.   HPV. You need this vaccine if you are a woman age 4 or younger. The vaccine is given in 3 doses over 6 months.   Measles, mumps, rubella (MMR). You need at least 1 dose of MMR if you were born in 1957 or later. You may also need a second dose.   Meningococcal. If you are age 51 to 24 and a first-year college student living in a residence hall, or have one of several medical conditions, you need to get vaccinated against meningococcal disease. You may also need additional booster doses.   Zoster (shingles). If you are age 54 or older, you should get this vaccine.   Varicella (chickenpox). If you have never had chickenpox or you were vaccinated but received only 1 dose, talk to your caregiver to find out if you need this vaccine.   Hepatitis A. You need this vaccine if you have a specific risk factor for hepatitis A virus infection or you simply wish to be protected from this disease. The vaccine is usually given as 2 doses, 6 to 18 months apart.   Hepatitis B. You need this vaccine if you have a specific risk factor for hepatitis B virus  infection or you simply wish to be protected from this disease. The vaccine is given in 3 doses, usually over 6 months.  Preventive Services / Frequency Ages 71 to 18  Blood pressure check.** / Every 1 to 2 years.   Lipid and cholesterol check.** / Every 5 years beginning at age 21.   Clinical breast exam.** / Every 3 years for women in their 45s and 30s.   Pap test.** / Every 2 years from ages 87 through 64. Every 3 years starting at age 46 through age 31 or 107 with a history of 3 consecutive normal Pap tests.   HPV screening.** / Every 3 years from ages 79 through ages 31  to 96 with a history of 3 consecutive normal Pap tests.   Hepatitis C blood test.** / For any individual with known risks for hepatitis C.   Skin self-exam. / Monthly.   Influenza immunization.** / Every year.   Pneumococcal polysaccharide immunization.** / 1 to 2 doses if you smoke cigarettes or if you have certain chronic medical conditions.   Tetanus, diphtheria, pertussis (Tdap, Td) immunization. / A one-time dose of Tdap vaccine. After that, you need a Td booster dose every 10 years.   HPV immunization. / 3 doses over 6 months, if you are 71 and younger.   Measles, mumps, rubella (MMR) immunization. / You need at least 1 dose of MMR if you were born in 1957 or later. You may also need a second dose.   Meningococcal immunization. / 1 dose if you are age 98 to 85 and a first-year college student living in a residence hall, or have one of several medical conditions, you need to get vaccinated against meningococcal disease. You may also need additional booster doses.   Varicella immunization.** / Consult your caregiver.   Hepatitis A immunization.** / Consult your caregiver. 2 doses, 6 to 18 months apart.   Hepatitis B immunization.** / Consult your caregiver. 3 doses usually over 6 months.  Ages 11 to 67  Blood pressure check.** / Every 1 to 2 years.   Lipid and cholesterol check.** / Every 5 years  beginning at age 52.   Clinical breast exam.** / Every year after age 65.   Mammogram.** / Every year beginning at age 57 and continuing for as long as you are in good health. Consult with your caregiver.   Pap test.** / Every 3 years starting at age 33 through age 54 or 44 with a history of 3 consecutive normal Pap tests.   HPV screening.** / Every 3 years from ages 58 through ages 43 to 33 with a history of 3 consecutive normal Pap tests.   Fecal occult blood test (FOBT) of stool. / Every year beginning at age 60 and continuing until age 39. You may not need to do this test if you get a colonoscopy every 10 years.   Flexible sigmoidoscopy or colonoscopy.** / Every 5 years for a flexible sigmoidoscopy or every 10 years for a colonoscopy beginning at age 83 and continuing until age 58.   Hepatitis C blood test.** / For all people born from [birth year] through 1965 and any individual with known risks for hepatitis C.   Skin self-exam. / Monthly.   Influenza immunization.** / Every year.   Pneumococcal polysaccharide immunization.** / 1 to 2 doses if you smoke cigarettes or if you have certain chronic medical conditions.   Tetanus, diphtheria, pertussis (Tdap, Td) immunization.** / A one-time dose of Tdap vaccine. After that, you need a Td booster dose every 10 years.   Measles, mumps, rubella (MMR) immunization. / You need at least 1 dose of MMR if you were born in 1957 or later. You may also need a second dose.   Varicella immunization.** / Consult your caregiver.   Meningococcal immunization.** / Consult your caregiver.   Hepatitis A immunization.** / Consult your caregiver. 2 doses, 6 to 18 months apart.   Hepatitis B immunization.** / Consult your caregiver. 3 doses, usually over 6 months.  Ages 65 and over  Blood pressure check.** / Every 1 to 2 years.   Lipid and cholesterol check.** / Every 5 years beginning at age 20.   Clinical breast  exam.** / Every year after age 63.    Mammogram.** / Every year beginning at age 37 and continuing for as long as you are in good health. Consult with your caregiver.   Pap test.** / Every 3 years starting at age 22 through age 30 or 54 with a 3 consecutive normal Pap tests. Testing can be stopped between 65 and 70 with 3 consecutive normal Pap tests and no abnormal Pap or HPV tests in the past 10 years.   HPV screening.** / Every 3 years from ages 39 through ages 89 or 7 with a history of 3 consecutive normal Pap tests. Testing can be stopped between 65 and 70 with 3 consecutive normal Pap tests and no abnormal Pap or HPV tests in the past 10 years.   Fecal occult blood test (FOBT) of stool. / Every year beginning at age 62 and continuing until age 40. You may not need to do this test if you get a colonoscopy every 10 years.   Flexible sigmoidoscopy or colonoscopy.** / Every 5 years for a flexible sigmoidoscopy or every 10 years for a colonoscopy beginning at age 77 and continuing until age 19.   Hepatitis C blood test.** / For all people born from [birth year] through 1965 and any individual with known risks for hepatitis C.   Osteoporosis screening.** / A one-time screening for women ages 34 and over and women at risk for fractures or osteoporosis.   Skin self-exam. / Monthly.   Influenza immunization.** / Every year.   Pneumococcal polysaccharide immunization.** / 1 dose at age 32 (or older) if you have never been vaccinated.   Tetanus, diphtheria, pertussis (Tdap, Td) immunization. / A one-time dose of Tdap vaccine if you are over 65 and have contact with an infant, are a Research scientist (physical sciences), or simply want to be protected from whooping cough. After that, you need a Td booster dose every 10 years.   Varicella immunization.** / Consult your caregiver.   Meningococcal immunization.** / Consult your caregiver.   Hepatitis A immunization.** / Consult your caregiver. 2 doses, 6 to 18 months apart.   Hepatitis B immunization.** /  Check with your caregiver. 3 doses, usually over 6 months.  ** Family history and personal history of risk and conditions may change your caregiver's recommendations. Document Released: 04/05/2001 Document Revised: 01/27/2011 Document Reviewed: 07/05/2010 [facility], [place] Patient Information 2012 [place], [place].

## 2011-05-11 NOTE — Progress Notes (Signed)
Subjective:     Beth Sanchez is a 56 y.o. female and is here for a comprehensive physical exam. The patient reports problems - knee and feet pain from standing so long.  History   Social History  . Marital Status: Widowed    Spouse Name: N/A    Number of Children: N/A  . Years of Education: N/A   Occupational History  . Not on file.   Social History Main Topics  . Smoking status: Never Smoker   . Smokeless tobacco: Never Used  . Alcohol Use: No  . Drug Use: No  . Sexually Active: Not on file   Other Topics Concern  . Not on file   Social History Narrative  . No narrative on file   Health Maintenance  Topic Date Due  . Influenza Vaccine  11/22/2011  . Mammogram  05/10/2013  . Pap Smear  05/11/2014  . Tetanus/tdap  03/27/2017  . Colonoscopy  05/10/2021    The following portions of the patient's history were reviewed and updated as appropriate: allergies, current medications, past family history, past medical history, past social history, past surgical history and problem list.  Review of Systems Review of Systems  Constitutional: Negative for activity change, appetite change and fatigue.  HENT: Negative for hearing loss, congestion, tinnitus and ear discharge.  dentist q48m Eyes: Negative for visual disturbance (see optho q1y -- vision corrected to 20/20 with glasses).  Respiratory: Negative for cough, chest tightness and shortness of breath.   Cardiovascular: Negative for chest pain, palpitations and leg swelling.  Gastrointestinal: Negative for abdominal pain, diarrhea, constipation and abdominal distention.  Genitourinary: Negative for urgency, frequency, decreased urine volume and difficulty urinating.  Musculoskeletal: Negative for back pain,  + feet and knee pain Skin: Negative for color change, pallor and rash.  Neurological: Negative for dizziness, light-headedness, numbness and headaches.  Hematological: Negative for adenopathy. Does not bruise/bleed easily.    Psychiatric/Behavioral: Negative for suicidal ideas, confusion, sleep disturbance, self-injury, dysphoric mood, decreased concentration and agitation.       Objective:    BP 120/86  Pulse 79  Temp(Src) 97.7 F (36.5 C) (Oral)  Ht 5' 6.25" (1.683 m)  Wt 275 lb (124.739 kg)  BMI 44.05 kg/m2  SpO2 96% General appearance: alert, cooperative, appears stated age and no distress Head: Normocephalic, without obvious abnormality, atraumatic Eyes: conjunctivae/corneas clear. PERRL, EOM's intact. Fundi benign. Ears: normal TM's and external ear canals both ears Nose: Nares normal. Septum midline. Mucosa normal. No drainage or sinus tenderness. Throat: lips, mucosa, and tongue normal; teeth and gums normal Neck: no adenopathy, no carotid bruit, no JVD, supple, symmetrical, trachea midline and thyroid not enlarged, symmetric, no tenderness/mass/nodules Back: symmetric, no curvature. ROM normal. No CVA tenderness. Lungs: clear to auscultation bilaterally Breasts: normal appearance, no masses or tenderness Heart: regular rate and rhythm, S1, S2 normal, no murmur, click, rub or gallop Abdomen: soft, non-tender; bowel sounds normal; no masses,  no organomegaly Pelvic: pt refused Extremities: extremities normal, atraumatic, no cyanosis or edema Pulses: 2+ and symmetric Skin: Skin color, texture, turgor normal. No rashes or lesions Lymph nodes: Cervical, supraclavicular, and axillary nodes normal. Neurologic: Alert and oriented X 3, normal strength and tone. Normal symmetric reflexes. Normal coordination and gait no anxiety / depression    Assessment:    Healthy female exam.      Plan:  ghm utd---for what pt will agree to.  Pt refuses pap, colon, mammogram----- she "just doesn't want to do it" ---she understands risks  check labs See After Visit Summary for Counseling Recommendations

## 2011-05-11 NOTE — Assessment & Plan Note (Signed)
con't meds stable 

## 2011-05-12 ENCOUNTER — Encounter: Payer: Self-pay | Admitting: *Deleted

## 2011-05-14 LAB — URINE CULTURE

## 2012-05-13 ENCOUNTER — Other Ambulatory Visit: Payer: Self-pay | Admitting: Family Medicine

## 2012-06-13 ENCOUNTER — Other Ambulatory Visit: Payer: Self-pay | Admitting: Family Medicine

## 2012-06-21 ENCOUNTER — Ambulatory Visit (INDEPENDENT_AMBULATORY_CARE_PROVIDER_SITE_OTHER): Payer: PRIVATE HEALTH INSURANCE | Admitting: Family Medicine

## 2012-06-21 ENCOUNTER — Encounter: Payer: Self-pay | Admitting: Family Medicine

## 2012-06-21 VITALS — BP 126/82 | HR 102 | Temp 99.0°F | Wt 281.2 lb

## 2012-06-21 DIAGNOSIS — I10 Essential (primary) hypertension: Secondary | ICD-10-CM

## 2012-06-21 MED ORDER — LISINOPRIL-HYDROCHLOROTHIAZIDE 10-12.5 MG PO TABS: ORAL_TABLET | ORAL | Status: DC

## 2012-06-21 MED ORDER — DICLOFENAC SODIUM 1 % TD GEL: 2.0000 g | Freq: Four times a day (QID) | TRANSDERMAL | Status: DC

## 2012-06-21 NOTE — Patient Instructions (Addendum)

## 2012-06-21 NOTE — Progress Notes (Signed)
  Subjective:    Patient here for follow-up of elevated blood pressure.  She is not exercising and is adherent to a low-salt diet.  Blood pressure is well controlled at home. Cardiac symptoms: none. Patient denies: chest pain, chest pressure/discomfort, claudication, dyspnea, exertional chest pressure/discomfort, fatigue, irregular heart beat, lower extremity edema, near-syncope, orthopnea, palpitations, paroxysmal nocturnal dyspnea, syncope and tachypnea. Cardiovascular risk factors: hypertension, obesity (BMI >= 30 kg/m2) and sedentary lifestyle. Use of agents associated with hypertension: none. History of target organ damage: none.  The following portions of the patient's history were reviewed and updated as appropriate: allergies, current medications, past family history, past medical history, past social history, past surgical history and problem list.  Review of Systems Pertinent items are noted in HPI.     Objective:    BP 126/82  Pulse 102  Temp(Src) 99 F (37.2 C) (Oral)  Wt 281 lb 3.2 oz (127.551 kg)  BMI 45.03 kg/m2  SpO2 96% General appearance: alert, cooperative, appears stated age and no distress Lungs: clear to auscultation bilaterally Heart: S1, S2 normal Extremities: extremities normal, atraumatic, no cyanosis or edema    Assessment:    Hypertension, normal blood pressure . Evidence of target organ damage: none.    Plan:    Medication: no change. Dietary sodium restriction. Regular aerobic exercise. Check blood pressures 2-3 times weekly and record. Follow up: 6 months and as needed.

## 2013-09-23 ENCOUNTER — Other Ambulatory Visit: Payer: Self-pay | Admitting: Family Medicine

## 2014-07-08 ENCOUNTER — Ambulatory Visit (INDEPENDENT_AMBULATORY_CARE_PROVIDER_SITE_OTHER): Payer: Self-pay | Admitting: Family Medicine

## 2014-07-08 ENCOUNTER — Encounter: Payer: Self-pay | Admitting: Family Medicine

## 2014-07-08 VITALS — BP 134/86 | HR 77 | Temp 98.3°F | Wt 281.2 lb

## 2014-07-08 DIAGNOSIS — M170 Bilateral primary osteoarthritis of knee: Secondary | ICD-10-CM

## 2014-07-08 DIAGNOSIS — I10 Essential (primary) hypertension: Secondary | ICD-10-CM

## 2014-07-08 DIAGNOSIS — R8290 Unspecified abnormal findings in urine: Secondary | ICD-10-CM

## 2014-07-08 LAB — CBC WITH DIFFERENTIAL/PLATELET
BASOS ABS: 0 10*3/uL (ref 0.0–0.1)
Basophils Relative: 0.4 % (ref 0.0–3.0)
EOS ABS: 0.1 10*3/uL (ref 0.0–0.7)
Eosinophils Relative: 2.5 % (ref 0.0–5.0)
HCT: 41.6 % (ref 36.0–46.0)
Hemoglobin: 14.1 g/dL (ref 12.0–15.0)
Lymphocytes Relative: 21.6 % (ref 12.0–46.0)
Lymphs Abs: 1.1 10*3/uL (ref 0.7–4.0)
MCHC: 33.8 g/dL (ref 30.0–36.0)
MCV: 80.5 fl (ref 78.0–100.0)
MONOS PCT: 6.1 % (ref 3.0–12.0)
Monocytes Absolute: 0.3 10*3/uL (ref 0.1–1.0)
Neutro Abs: 3.6 10*3/uL (ref 1.4–7.7)
Neutrophils Relative %: 69.4 % (ref 43.0–77.0)
PLATELETS: 198 10*3/uL (ref 150.0–400.0)
RBC: 5.17 Mil/uL — ABNORMAL HIGH (ref 3.87–5.11)
RDW: 15.5 % (ref 11.5–15.5)
WBC: 5.2 10*3/uL (ref 4.0–10.5)

## 2014-07-08 LAB — POCT URINALYSIS DIPSTICK
BILIRUBIN UA: NEGATIVE
Glucose, UA: NEGATIVE
KETONES UA: NEGATIVE
LEUKOCYTES UA: NEGATIVE
Nitrite, UA: NEGATIVE
PH UA: 6
Protein, UA: NEGATIVE
Spec Grav, UA: 1.02
Urobilinogen, UA: NEGATIVE

## 2014-07-08 LAB — HEPATIC FUNCTION PANEL
ALBUMIN: 4 g/dL (ref 3.5–5.2)
ALK PHOS: 134 U/L — AB (ref 39–117)
ALT: 27 U/L (ref 0–35)
AST: 24 U/L (ref 0–37)
BILIRUBIN TOTAL: 1 mg/dL (ref 0.2–1.2)
Bilirubin, Direct: 0.3 mg/dL (ref 0.0–0.3)
TOTAL PROTEIN: 8.2 g/dL (ref 6.0–8.3)

## 2014-07-08 LAB — LIPID PANEL
Cholesterol: 157 mg/dL (ref 0–200)
HDL: 33.5 mg/dL — AB (ref >39.00)
LDL CALC: 105 mg/dL — AB (ref 0–99)
NonHDL: 123.5
Total CHOL/HDL Ratio: 5
Triglycerides: 92 mg/dL (ref 0.0–149.0)
VLDL: 18.4 mg/dL (ref 0.0–40.0)

## 2014-07-08 LAB — BASIC METABOLIC PANEL
BUN: 15 mg/dL (ref 6–23)
CALCIUM: 9.7 mg/dL (ref 8.4–10.5)
CO2: 28 meq/L (ref 19–32)
CREATININE: 0.86 mg/dL (ref 0.40–1.20)
Chloride: 100 mEq/L (ref 96–112)
GFR: 71.7 mL/min (ref >60.00)
GLUCOSE: 179 mg/dL — AB (ref 70–99)
Potassium: 4.1 mEq/L (ref 3.5–5.1)
Sodium: 133 mEq/L — ABNORMAL LOW (ref 135–145)

## 2014-07-08 LAB — TSH: TSH: 3.42 u[IU]/mL (ref 0.35–4.50)

## 2014-07-08 LAB — MICROALBUMIN / CREATININE URINE RATIO
Creatinine,U: 45.9 mg/dL
Microalb Creat Ratio: 2.4 mg/g (ref 0.0–30.0)
Microalb, Ur: 1.1 mg/dL (ref 0.0–1.9)

## 2014-07-08 MED ORDER — LISINOPRIL-HYDROCHLOROTHIAZIDE 10-12.5 MG PO TABS: ORAL_TABLET | ORAL | Status: DC

## 2014-07-08 MED ORDER — DICLOFENAC SODIUM 1 % TD GEL: 2.0000 g | Freq: Four times a day (QID) | TRANSDERMAL | Status: DC

## 2014-07-08 NOTE — Progress Notes (Signed)
Pre visit review using our clinic review tool, if applicable. No additional management support is needed unless otherwise documented below in the visit note. 

## 2014-07-08 NOTE — Assessment & Plan Note (Signed)
Pt to restart meds and take regularly rto cpe Check labs

## 2014-07-08 NOTE — Patient Instructions (Signed)

## 2014-07-08 NOTE — Assessment & Plan Note (Signed)
   Refill voltaren gel  rto prn

## 2014-07-08 NOTE — Progress Notes (Signed)
Subjective:    Patient ID: Alpha Gula, female    DOB: 04/08/1955, 59 y.o.   MRN: [id number]  HPI  Patient here for bp f/u.  Pt has not been here in 2 years secondary to having no insurance.     Past Medical History  Diagnosis Date  . Hypertension   . Psoriasis     Review of Systems  Constitutional: Negative for diaphoresis, appetite change, fatigue and unexpected weight change.  Eyes: Negative for pain, redness and visual disturbance.  Respiratory: Negative for cough, chest tightness, shortness of breath and wheezing.   Cardiovascular: Negative for chest pain, palpitations and leg swelling.  Endocrine: Negative for cold intolerance, heat intolerance, polydipsia, polyphagia and polyuria.  Genitourinary: Negative for dysuria, frequency and difficulty urinating.  Neurological: Negative for dizziness, light-headedness, numbness and headaches.  Psychiatric/Behavioral: Negative for decreased concentration. The patient is not nervous/anxious.     No current outpatient prescriptions on file prior to visit.   No current facility-administered medications on file prior to visit.       Objective:    Physical Exam  Constitutional: She is oriented to person, place, and time. She appears well-developed and well-nourished.  HENT:  Head: Normocephalic and atraumatic.  Eyes: Conjunctivae and EOM are normal.  Neck: Normal range of motion. Neck supple. No JVD present. Carotid bruit is not present. No thyromegaly present.  Cardiovascular: Normal rate, regular rhythm and normal heart sounds.   No murmur heard. Pulmonary/Chest: Effort normal and breath sounds normal. No respiratory distress. She has no wheezes. She has no rales. She exhibits no tenderness.  Musculoskeletal: She exhibits no edema.  Neurological: She is alert and oriented to person, place, and time.  Psychiatric: She has a normal mood and affect.    BP 134/86 mmHg  Pulse 77  Temp(Src) 98.3 F (36.8 C) (Oral)  Wt 281  lb 3.2 oz (127.551 kg)  SpO2 98% Wt Readings from Last 3 Encounters:  07/08/14 281 lb 3.2 oz (127.551 kg)  06/21/12 281 lb 3.2 oz (127.551 kg)  05/11/11 275 lb (124.739 kg)     Lab Results  Component Value Date   WBC 4.2* 05/11/2011   HGB 12.1 05/11/2011   HCT 37.1 05/11/2011   PLT 197.0 05/11/2011   GLUCOSE 123* 05/11/2011   CHOL 152 05/11/2011   TRIG 72.0 05/11/2011   HDL 37.80* 05/11/2011   LDLCALC 100* 05/11/2011   ALT 24 05/11/2011   AST 23 05/11/2011   NA 133* 05/11/2011   K 4.0 05/11/2011   CL 101 05/11/2011   CREATININE 0.8 05/11/2011   BUN 19 05/11/2011   CO2 24 05/11/2011   TSH 1.58 05/11/2011   HGBA1C 5.9 01/09/2009       Assessment & Plan:   Problem List Items Addressed This Visit    Osteoarthritis of both knees     Refill voltaren gel  rto prn      Relevant Medications   diclofenac sodium (VOLTAREN) 1 % GEL   Essential hypertension - Primary    Pt to restart meds and take regularly rto cpe Check labs      Relevant Medications   lisinopril-hydrochlorothiazide (PRINZIDE,ZESTORETIC) 10-12.5 MG per tablet   Other Relevant Orders   CBC with Differential/Platelet   Hepatic function panel   Lipid panel   POCT urinalysis dipstick   Microalbumin / creatinine urine ratio   Basic metabolic panel   TSH      I have changed Ms. Fasnacht's lisinopril-hydrochlorothiazide. I am also having her  maintain her diclofenac sodium.  Meds ordered this encounter  Medications  . lisinopril-hydrochlorothiazide (PRINZIDE,ZESTORETIC) 10-12.5 MG per tablet    Sig: 1 tab by mouth once daily--    Dispense:  90 tablet    Refill:  1  . diclofenac sodium (VOLTAREN) 1 % GEL    Sig: Apply 2 g topically 4 (four) times daily.    Dispense:  100 g    Refill:  [place], DO

## 2014-07-08 NOTE — Addendum Note (Signed)
Addended by: Peggyann Shoals on: 07/08/2014 10:45 AM   Modules accepted: Orders

## 2014-07-10 LAB — URINE CULTURE: Colony Count: 25000

## 2014-07-15 DIAGNOSIS — R7309 Other abnormal glucose: Secondary | ICD-10-CM

## 2014-07-23 ENCOUNTER — Other Ambulatory Visit: Payer: Self-pay

## 2014-12-10 ENCOUNTER — Telehealth: Payer: Self-pay | Admitting: Family Medicine

## 2014-12-10 NOTE — Telephone Encounter (Signed)
Caller name: Shirell Struthers  Relationship to patient: Self   Can be reached:[phone]    Reason for call: pt called in to find out the prices for the flu shot and pneumonia vaccination, pt says that she doesn't have insurance and would be paying out of pocket.

## 2014-12-10 NOTE — Telephone Encounter (Signed)
Message routed to Beth Sanchez, TEFL teacher.  Please advise.

## 2014-12-26 ENCOUNTER — Other Ambulatory Visit: Payer: Self-pay

## 2014-12-26 ENCOUNTER — Ambulatory Visit (INDEPENDENT_AMBULATORY_CARE_PROVIDER_SITE_OTHER): Payer: Self-pay

## 2014-12-26 DIAGNOSIS — Z23 Encounter for immunization: Secondary | ICD-10-CM

## 2014-12-26 DIAGNOSIS — I10 Essential (primary) hypertension: Secondary | ICD-10-CM

## 2014-12-26 MED ORDER — LISINOPRIL-HYDROCHLOROTHIAZIDE 10-12.5 MG PO TABS: ORAL_TABLET | ORAL | Status: DC

## 2015-02-25 ENCOUNTER — Telehealth: Payer: Self-pay | Admitting: Family Medicine

## 2015-02-25 DIAGNOSIS — I10 Essential (primary) hypertension: Secondary | ICD-10-CM

## 2015-02-25 MED ORDER — LISINOPRIL-HYDROCHLOROTHIAZIDE 10-12.5 MG PO TABS: ORAL_TABLET | ORAL | Status: DC

## 2015-02-25 NOTE — Telephone Encounter (Signed)
CPE scheduled for May. Rx faxed    KP

## 2015-02-25 NOTE — Telephone Encounter (Signed)
Caller name:Patricia Relationship to patient:self Can be reached:[phone] Pharmacy:[facility] [address]   Reason for call:lisinopril 10-12.5mg  tabs   She had asked for a 90 day when she was in to see Dr Etter Sjogren in December  She only got a 30 day  She needs a refill for 90

## 2015-02-25 NOTE — Telephone Encounter (Signed)
Called pt and left message to inform that she needs to call in and schedule a physical.  Did not refill RX.

## 2015-02-25 NOTE — Addendum Note (Signed)
Addended by: Ewing Schlein on: 02/25/2015 11:55 AM   Modules accepted: Orders

## 2015-02-25 NOTE — Telephone Encounter (Signed)
Patient called back and stated she cannot come in until the end of Feb. She stated she told this to the provider and her nurse want to know what else can she do because she is out of her medication still. Please advise

## 2015-03-06 ENCOUNTER — Telehealth: Payer: Self-pay | Admitting: Behavioral Health

## 2015-03-06 NOTE — Telephone Encounter (Signed)
The following gaps in care were assessed: Mammogram Pap Colonoscopy  Patient declined all of the above. CPE is scheduled with PCP for 06/22/15.

## 2015-06-22 ENCOUNTER — Ambulatory Visit (INDEPENDENT_AMBULATORY_CARE_PROVIDER_SITE_OTHER): Payer: Self-pay | Admitting: Family Medicine

## 2015-06-22 ENCOUNTER — Encounter: Payer: Self-pay | Admitting: Family Medicine

## 2015-06-22 VITALS — BP 116/84 | HR 77 | Temp 97.8°F | Ht 66.0 in | Wt 266.8 lb

## 2015-06-22 DIAGNOSIS — Z114 Encounter for screening for human immunodeficiency virus [HIV]: Secondary | ICD-10-CM

## 2015-06-22 DIAGNOSIS — Z0000 Encounter for general adult medical examination without abnormal findings: Secondary | ICD-10-CM

## 2015-06-22 DIAGNOSIS — I10 Essential (primary) hypertension: Secondary | ICD-10-CM

## 2015-06-22 DIAGNOSIS — Z1159 Encounter for screening for other viral diseases: Secondary | ICD-10-CM

## 2015-06-22 LAB — HEPATITIS C ANTIBODY: HCV AB: NEGATIVE

## 2015-06-22 LAB — COMPREHENSIVE METABOLIC PANEL
ALBUMIN: 4 g/dL (ref 3.5–5.2)
ALT: 24 U/L (ref 0–35)
AST: 21 U/L (ref 0–37)
Alkaline Phosphatase: 111 U/L (ref 39–117)
BUN: 14 mg/dL (ref 6–23)
CHLORIDE: 98 meq/L (ref 96–112)
CO2: 25 mEq/L (ref 19–32)
Calcium: 9.6 mg/dL (ref 8.4–10.5)
Creatinine, Ser: 0.75 mg/dL (ref 0.40–1.20)
GFR: 83.7 mL/min (ref >60.00)
Glucose, Bld: 315 mg/dL — ABNORMAL HIGH (ref 70–99)
POTASSIUM: 4.1 meq/L (ref 3.5–5.1)
SODIUM: 131 meq/L — AB (ref 135–145)
Total Bilirubin: 1.2 mg/dL (ref 0.2–1.2)
Total Protein: 7.7 g/dL (ref 6.0–8.3)

## 2015-06-22 LAB — CBC WITH DIFFERENTIAL/PLATELET
BASOS PCT: 0.2 % (ref 0.0–3.0)
Basophils Absolute: 0 10*3/uL (ref 0.0–0.1)
EOS PCT: 2.2 % (ref 0.0–5.0)
Eosinophils Absolute: 0.1 10*3/uL (ref 0.0–0.7)
HEMATOCRIT: 43.5 % (ref 36.0–46.0)
HEMOGLOBIN: 14.7 g/dL (ref 12.0–15.0)
Lymphocytes Relative: 24.4 % (ref 12.0–46.0)
Lymphs Abs: 1.2 10*3/uL (ref 0.7–4.0)
MCHC: 33.8 g/dL (ref 30.0–36.0)
MCV: 81.4 fl (ref 78.0–100.0)
MONO ABS: 0.2 10*3/uL (ref 0.1–1.0)
Monocytes Relative: 4.8 % (ref 3.0–12.0)
NEUTROS ABS: 3.5 10*3/uL (ref 1.4–7.7)
Neutrophils Relative %: 68.4 % (ref 43.0–77.0)
PLATELETS: 173 10*3/uL (ref 150.0–400.0)
RBC: 5.35 Mil/uL — ABNORMAL HIGH (ref 3.87–5.11)
RDW: 14.7 % (ref 11.5–15.5)
WBC: 5.1 10*3/uL (ref 4.0–10.5)

## 2015-06-22 LAB — LIPID PANEL
CHOL/HDL RATIO: 5
Cholesterol: 148 mg/dL (ref 0–200)
HDL: 32.1 mg/dL — ABNORMAL LOW (ref >39.00)
LDL Cholesterol: 90 mg/dL (ref 0–99)
NonHDL: 116.08
Triglycerides: 130 mg/dL (ref 0.0–149.0)
VLDL: 26 mg/dL (ref 0.0–40.0)

## 2015-06-22 LAB — POCT URINALYSIS DIPSTICK
BILIRUBIN UA: NEGATIVE
Blood, UA: NEGATIVE
GLUCOSE UA: 1000
Ketones, UA: NEGATIVE
LEUKOCYTES UA: NEGATIVE
Nitrite, UA: NEGATIVE
Protein, UA: NEGATIVE
Spec Grav, UA: 1.025
UROBILINOGEN UA: 1
pH, UA: 6

## 2015-06-22 LAB — HIV ANTIBODY (ROUTINE TESTING W REFLEX): HIV: NONREACTIVE

## 2015-06-22 LAB — TSH: TSH: 1.9 u[IU]/mL (ref 0.35–4.50)

## 2015-06-22 MED ORDER — LISINOPRIL-HYDROCHLOROTHIAZIDE 10-12.5 MG PO TABS: ORAL_TABLET | ORAL | Status: DC

## 2015-06-22 NOTE — Progress Notes (Signed)
Subjective:     Beth Sanchez is a 60 y.o. female and is here for a comprehensive physical exam. The patient reports no problems.  Social History   Social History  . Marital Status: Widowed    Spouse Name: N/A  . Number of Children: N/A  . Years of Education: N/A   Occupational History  . Not on file.   Social History Main Topics  . Smoking status: Never Smoker   . Smokeless tobacco: Never Used  . Alcohol Use: No  . Drug Use: No  . Sexual Activity: Not on file   Other Topics Concern  . Not on file   Social History Narrative   Health Maintenance  Topic Date Due  . Hepatitis C Screening  Mar 10, 1955  . ZOSTAVAX  03/11/2015  . HIV Screening  07/08/2015 (Originally 03/10/1970)  . MAMMOGRAM  03/04/2016 (Originally 05/10/2013)  . PAP SMEAR  03/04/2016 (Originally 05/11/2014)  . INFLUENZA VACCINE  09/22/2015  . TETANUS/TDAP  03/27/2017  . COLONOSCOPY  05/10/2021    The following portions of the patient's history were reviewed and updated as appropriate:  She  has a past medical history of Hypertension and Psoriasis. She  does not have any pertinent problems on file. She  has no past surgical history on file. Her family history includes Heart attack (age of onset: 44) in her maternal grandfather; Stroke in her mother. She  reports that she has never smoked. She has never used smokeless tobacco. She reports that she does not drink alcohol or use illicit drugs. She has a current medication list which includes the following prescription(s): diclofenac sodium and lisinopril-hydrochlorothiazide. Current Outpatient Prescriptions on File Prior to Visit  Medication Sig Dispense Refill  . diclofenac sodium (VOLTAREN) 1 % GEL Apply 2 g topically 4 (four) times daily. 100 g 5  . lisinopril-hydrochlorothiazide (PRINZIDE,ZESTORETIC) 10-12.5 MG tablet 1 tab by mouth once daily 90 tablet 1   No current facility-administered medications on file prior to visit.   She has No Known  Allergies..  Review of Systems Review of Systems  Constitutional: Negative for activity change, appetite change and fatigue.  HENT: Negative for hearing loss, congestion, tinnitus and ear discharge.  dentist q55m Eyes: Negative for visual disturbance (see optho q1y -- vision corrected to 20/20 with glasses).  Respiratory: Negative for cough, chest tightness and shortness of breath.   Cardiovascular: Negative for chest pain, palpitations and leg swelling.  Gastrointestinal: Negative for abdominal pain, diarrhea, constipation and abdominal distention.  Genitourinary: Negative for urgency, frequency, decreased urine volume and difficulty urinating.  Musculoskeletal: Negative for back pain, arthralgias and gait problem.  Skin: Negative for color change, pallor and rash.  Neurological: Negative for dizziness, light-headedness, numbness and headaches.  Hematological: Negative for adenopathy. Does not bruise/bleed easily.  Psychiatric/Behavioral: Negative for suicidal ideas, confusion, sleep disturbance, self-injury, dysphoric mood, decreased concentration and agitation.       Objective:    BP 143/89 mmHg  Pulse 79  Temp(Src) 97.8 F (36.6 C) (Oral)  Ht 5\' 6"  (1.676 m)  Wt 266 lb 12.8 oz (121.02 kg)  BMI 43.08 kg/m2  SpO2 97% General appearance: alert, cooperative, appears stated age and morbidly obese Head: Normocephalic, without obvious abnormality, atraumatic Eyes: negative findings: lids and lashes normal, conjunctivae and sclerae normal and pupils equal, round, reactive to light and accomodation Ears: normal TM's and external ear canals both ears Nose: Nares normal. Septum midline. Mucosa normal. No drainage or sinus tenderness. Throat: lips, mucosa, and tongue normal; teeth and  gums normal Neck: no adenopathy, no carotid bruit, no JVD, supple, symmetrical, trachea midline and thyroid not enlarged, symmetric, no tenderness/mass/nodules Back: symmetric, no curvature. ROM normal. No  CVA tenderness. Lungs: clear to auscultation bilaterally Breasts: Inspection negative, No nipple retraction or dimpling, No nipple discharge or bleeding, No axillary or supraclavicular adenopathy, positive findings: irregular and non-tender nodule located bilaterally lower outer quadrant Heart: regular rate and rhythm, S1, S2 normal, no murmur, click, rub or gallop Abdomen: soft, non-tender; bowel sounds normal; no masses,  no organomegaly Pelvic: deferred Extremities: extremities normal, atraumatic, no cyanosis or edema Pulses: 2+ and symmetric Skin: Skin color, texture, turgor normal. No rashes or lesions Lymph nodes: Cervical, supraclavicular, and axillary nodes normal. Neurologic: Alert and oriented X 3, normal strength and tone. Normal symmetric reflexes. Normal coordination and gait Psych- no depression, no anxiety      Assessment:    Healthy female exam.  Plan:    ghm utd Check labs See After Visit Summary for Counseling Recommendations    1. Essential hypertension  - lisinopril-hydrochlorothiazide (PRINZIDE,ZESTORETIC) 10-12.5 MG tablet; 1 tab by mouth once daily  Dispense: 90 tablet; Refill: 1 - Comprehensive metabolic panel - Lipid panel - HIV antibody - POCT urinalysis dipstick - TSH  2. Preventative health care   - Comprehensive metabolic panel - CBC with Differential/Platelet - Lipid panel - HIV antibody - POCT urinalysis dipstick - TSH  3. Need for hepatitis C screening test   - Hepatitis C antibody  4. Encounter for screening for HIV   - HIV antibody

## 2015-06-22 NOTE — Progress Notes (Signed)
Pre visit review using our clinic review tool, if applicable. No additional management support is needed unless otherwise documented below in the visit note. 

## 2015-06-22 NOTE — Patient Instructions (Signed)
Preventive Care for Adults, Female A healthy lifestyle and preventive care can promote health and wellness. Preventive health guidelines for women include the following key practices.  A routine yearly physical is a good way to check with your health care provider about your health and preventive screening. It is a chance to share any concerns and updates on your health and to receive a thorough exam.  Visit your dentist for a routine exam and preventive care every 6 months. Brush your teeth twice a day and floss once a day. Good oral hygiene prevents tooth decay and gum disease.  The frequency of eye exams is based on your age, health, family medical history, use of contact lenses, and other factors. Follow your health care provider's recommendations for frequency of eye exams.  Eat a healthy diet. Foods like vegetables, fruits, whole grains, low-fat dairy products, and lean protein foods contain the nutrients you need without too many calories. Decrease your intake of foods high in solid fats, added sugars, and salt. Eat the right amount of calories for you.Get information about a proper diet from your health care provider, if necessary.  Regular physical exercise is one of the most important things you can do for your health. Most adults should get at least 150 minutes of moderate-intensity exercise (any activity that increases your heart rate and causes you to sweat) each week. In addition, most adults need muscle-strengthening exercises on 2 or more days a week.  Maintain a healthy weight. The body mass index (BMI) is a screening tool to identify possible weight problems. It provides an estimate of body fat based on height and weight. Your health care provider can find your BMI and can help you achieve or maintain a healthy weight.For adults 20 years and older:  A BMI below 18.5 is considered underweight.  A BMI of 18.5 to 24.9 is normal.  A BMI of 25 to 29.9 is considered overweight.  A  BMI of 30 and above is considered obese.  Maintain normal blood lipids and cholesterol levels by exercising and minimizing your intake of saturated fat. Eat a balanced diet with plenty of fruit and vegetables. Blood tests for lipids and cholesterol should begin at age 45 and be repeated every 5 years. If your lipid or cholesterol levels are high, you are over 50, or you are at high risk for heart disease, you may need your cholesterol levels checked more frequently.Ongoing high lipid and cholesterol levels should be treated with medicines if diet and exercise are not working.  If you smoke, find out from your health care provider how to quit. If you do not use tobacco, do not start.  Lung cancer screening is recommended for adults aged 45-80 years who are at high risk for developing lung cancer because of a history of smoking. A yearly low-dose CT scan of the lungs is recommended for people who have at least a 30-pack-year history of smoking and are a current smoker or have quit within the past 15 years. A pack year of smoking is smoking an average of 1 pack of cigarettes a day for 1 year (for example: 1 pack a day for 30 years or 2 packs a day for 15 years). Yearly screening should continue until the smoker has stopped smoking for at least 15 years. Yearly screening should be stopped for people who develop a health problem that would prevent them from having lung cancer treatment.  If you are pregnant, do not drink alcohol. If you are  breastfeeding, be very cautious about drinking alcohol. If you are not pregnant and choose to drink alcohol, do not have more than 1 drink per day. One drink is considered to be 12 ounces (355 mL) of beer, 5 ounces (148 mL) of wine, or 1.5 ounces (44 mL) of liquor.  Avoid use of street drugs. Do not share needles with anyone. Ask for help if you need support or instructions about stopping the use of drugs.  High blood pressure causes heart disease and increases the risk  of stroke. Your blood pressure should be checked at least every 1 to 2 years. Ongoing high blood pressure should be treated with medicines if weight loss and exercise do not work.  If you are 55-79 years old, ask your health care provider if you should take aspirin to prevent strokes.  Diabetes screening is done by taking a blood sample to check your blood glucose level after you have not eaten for a certain period of time (fasting). If you are not overweight and you do not have risk factors for diabetes, you should be screened once every 3 years starting at age 45. If you are overweight or obese and you are 40-70 years of age, you should be screened for diabetes every year as part of your cardiovascular risk assessment.  Breast cancer screening is essential preventive care for women. You should practice "breast self-awareness." This means understanding the normal appearance and feel of your breasts and may include breast self-examination. Any changes detected, no matter how small, should be reported to a health care provider. Women in their 20s and 30s should have a clinical breast exam (CBE) by a health care provider as part of a regular health exam every 1 to 3 years. After age 40, women should have a CBE every year. Starting at age 40, women should consider having a mammogram (breast X-ray test) every year. Women who have a family history of breast cancer should talk to their health care provider about genetic screening. Women at a high risk of breast cancer should talk to their health care providers about having an MRI and a mammogram every year.  Breast cancer gene (BRCA)-related cancer risk assessment is recommended for women who have family members with BRCA-related cancers. BRCA-related cancers include breast, ovarian, tubal, and peritoneal cancers. Having family members with these cancers may be associated with an increased risk for harmful changes (mutations) in the breast cancer genes BRCA1 and  BRCA2. Results of the assessment will determine the need for genetic counseling and BRCA1 and BRCA2 testing.  Your health care provider may recommend that you be screened regularly for cancer of the pelvic organs (ovaries, uterus, and vagina). This screening involves a pelvic examination, including checking for microscopic changes to the surface of your cervix (Pap test). You may be encouraged to have this screening done every 3 years, beginning at age 21.  For women ages 30-65, health care providers may recommend pelvic exams and Pap testing every 3 years, or they may recommend the Pap and pelvic exam, combined with testing for human papilloma virus (HPV), every 5 years. Some types of HPV increase your risk of cervical cancer. Testing for HPV may also be done on women of any age with unclear Pap test results.  Other health care providers may not recommend any screening for nonpregnant women who are considered low risk for pelvic cancer and who do not have symptoms. Ask your health care provider if a screening pelvic exam is right for   you.  If you have had past treatment for cervical cancer or a condition that could lead to cancer, you need Pap tests and screening for cancer for at least 20 years after your treatment. If Pap tests have been discontinued, your risk factors (such as having a new sexual partner) need to be reassessed to determine if screening should resume. Some women have medical problems that increase the chance of getting cervical cancer. In these cases, your health care provider may recommend more frequent screening and Pap tests.  Colorectal cancer can be detected and often prevented. Most routine colorectal cancer screening begins at the age of 50 years and continues through age 75 years. However, your health care provider may recommend screening at an earlier age if you have risk factors for colon cancer. On a yearly basis, your health care provider may provide home test kits to check  for hidden blood in the stool. Use of a small camera at the end of a tube, to directly examine the colon (sigmoidoscopy or colonoscopy), can detect the earliest forms of colorectal cancer. Talk to your health care provider about this at age 50, when routine screening begins. Direct exam of the colon should be repeated every 5-10 years through age 75 years, unless early forms of precancerous polyps or small growths are found.  People who are at an increased risk for hepatitis B should be screened for this virus. You are considered at high risk for hepatitis B if:  You were born in a country where hepatitis B occurs often. Talk with your health care provider about which countries are considered high risk.  Your parents were born in a high-risk country and you have not received a shot to protect against hepatitis B (hepatitis B vaccine).  You have HIV or AIDS.  You use needles to inject street drugs.  You live with, or have sex with, someone who has hepatitis B.  You get hemodialysis treatment.  You take certain medicines for conditions like cancer, organ transplantation, and autoimmune conditions.  Hepatitis C blood testing is recommended for all people born from 1945 through 1965 and any individual with known risks for hepatitis C.  Practice safe sex. Use condoms and avoid high-risk sexual practices to reduce the spread of sexually transmitted infections (STIs). STIs include gonorrhea, chlamydia, syphilis, trichomonas, herpes, HPV, and human immunodeficiency virus (HIV). Herpes, HIV, and HPV are viral illnesses that have no cure. They can result in disability, cancer, and death.  You should be screened for sexually transmitted illnesses (STIs) including gonorrhea and chlamydia if:  You are sexually active and are younger than 24 years.  You are older than 24 years and your health care provider tells you that you are at risk for this type of infection.  Your sexual activity has changed  since you were last screened and you are at an increased risk for chlamydia or gonorrhea. Ask your health care provider if you are at risk.  If you are at risk of being infected with HIV, it is recommended that you take a prescription medicine daily to prevent HIV infection. This is called preexposure prophylaxis (PrEP). You are considered at risk if:  You are sexually active and do not regularly use condoms or know the HIV status of your partner(s).  You take drugs by injection.  You are sexually active with a partner who has HIV.  Talk with your health care provider about whether you are at high risk of being infected with HIV. If   you choose to begin PrEP, you should first be tested for HIV. You should then be tested every 3 months for as long as you are taking PrEP.  Osteoporosis is a disease in which the bones lose minerals and strength with aging. This can result in serious bone fractures or breaks. The risk of osteoporosis can be identified using a bone density scan. Women ages 67 years and over and women at risk for fractures or osteoporosis should discuss screening with their health care providers. Ask your health care provider whether you should take a calcium supplement or vitamin D to reduce the rate of osteoporosis.  Menopause can be associated with physical symptoms and risks. Hormone replacement therapy is available to decrease symptoms and risks. You should talk to your health care provider about whether hormone replacement therapy is right for you.  Use sunscreen. Apply sunscreen liberally and repeatedly throughout the day. You should seek shade when your shadow is shorter than you. Protect yourself by wearing long sleeves, pants, a wide-brimmed hat, and sunglasses year round, whenever you are outdoors.  Once a month, do a whole body skin exam, using a mirror to look at the skin on your back. Tell your health care provider of new moles, moles that have irregular borders, moles that  are larger than a pencil eraser, or moles that have changed in shape or color.  Stay current with required vaccines (immunizations).  Influenza vaccine. All adults should be immunized every year.  Tetanus, diphtheria, and acellular pertussis (Td, Tdap) vaccine. Pregnant women should receive 1 dose of Tdap vaccine during each pregnancy. The dose should be obtained regardless of the length of time since the last dose. Immunization is preferred during the 27th-36th week of gestation. An adult who has not previously received Tdap or who does not know her vaccine status should receive 1 dose of Tdap. This initial dose should be followed by tetanus and diphtheria toxoids (Td) booster doses every 10 years. Adults with an unknown or incomplete history of completing a 3-dose immunization series with Td-containing vaccines should begin or complete a primary immunization series including a Tdap dose. Adults should receive a Td booster every 10 years.  Varicella vaccine. An adult without evidence of immunity to varicella should receive 2 doses or a second dose if she has previously received 1 dose. Pregnant females who do not have evidence of immunity should receive the first dose after pregnancy. This first dose should be obtained before leaving the health care facility. The second dose should be obtained 4-8 weeks after the first dose.  Human papillomavirus (HPV) vaccine. Females aged 13-26 years who have not received the vaccine previously should obtain the 3-dose series. The vaccine is not recommended for use in pregnant females. However, pregnancy testing is not needed before receiving a dose. If a female is found to be pregnant after receiving a dose, no treatment is needed. In that case, the remaining doses should be delayed until after the pregnancy. Immunization is recommended for any person with an immunocompromised condition through the age of 61 years if she did not get any or all doses earlier. During the  3-dose series, the second dose should be obtained 4-8 weeks after the first dose. The third dose should be obtained 24 weeks after the first dose and 16 weeks after the second dose.  Zoster vaccine. One dose is recommended for adults aged 30 years or older unless certain conditions are present.  Measles, mumps, and rubella (MMR) vaccine. Adults born  before 1957 generally are considered immune to measles and mumps. Adults born in 1957 or later should have 1 or more doses of MMR vaccine unless there is a contraindication to the vaccine or there is laboratory evidence of immunity to each of the three diseases. A routine second dose of MMR vaccine should be obtained at least 28 days after the first dose for students attending postsecondary schools, health care workers, or international travelers. People who received inactivated measles vaccine or an unknown type of measles vaccine during 1963-1967 should receive 2 doses of MMR vaccine. People who received inactivated mumps vaccine or an unknown type of mumps vaccine before 1979 and are at high risk for mumps infection should consider immunization with 2 doses of MMR vaccine. For females of childbearing age, rubella immunity should be determined. If there is no evidence of immunity, females who are not pregnant should be vaccinated. If there is no evidence of immunity, females who are pregnant should delay immunization until after pregnancy. Unvaccinated health care workers born before 1957 who lack laboratory evidence of measles, mumps, or rubella immunity or laboratory confirmation of disease should consider measles and mumps immunization with 2 doses of MMR vaccine or rubella immunization with 1 dose of MMR vaccine.  Pneumococcal 13-valent conjugate (PCV13) vaccine. When indicated, a person who is uncertain of his immunization history and has no record of immunization should receive the PCV13 vaccine. All adults 65 years of age and older should receive this  vaccine. An adult aged 19 years or older who has certain medical conditions and has not been previously immunized should receive 1 dose of PCV13 vaccine. This PCV13 should be followed with a dose of pneumococcal polysaccharide (PPSV23) vaccine. Adults who are at high risk for pneumococcal disease should obtain the PPSV23 vaccine at least 8 weeks after the dose of PCV13 vaccine. Adults older than 60 years of age who have normal immune system function should obtain the PPSV23 vaccine dose at least 1 year after the dose of PCV13 vaccine.  Pneumococcal polysaccharide (PPSV23) vaccine. When PCV13 is also indicated, PCV13 should be obtained first. All adults aged 65 years and older should be immunized. An adult younger than age 65 years who has certain medical conditions should be immunized. Any person who resides in a nursing home or long-term care facility should be immunized. An adult smoker should be immunized. People with an immunocompromised condition and certain other conditions should receive both PCV13 and PPSV23 vaccines. People with human immunodeficiency virus (HIV) infection should be immunized as soon as possible after diagnosis. Immunization during chemotherapy or radiation therapy should be avoided. Routine use of PPSV23 vaccine is not recommended for American Indians, [place] Natives, or people younger than 65 years unless there are medical conditions that require PPSV23 vaccine. When indicated, people who have unknown immunization and have no record of immunization should receive PPSV23 vaccine. One-time revaccination 5 years after the first dose of PPSV23 is recommended for people aged 19-64 years who have chronic kidney failure, nephrotic syndrome, asplenia, or immunocompromised conditions. People who received 1-2 doses of PPSV23 before age 65 years should receive another dose of PPSV23 vaccine at age 65 years or later if at least 5 years have passed since the previous dose. Doses of PPSV23 are not  needed for people immunized with PPSV23 at or after age 65 years.  Meningococcal vaccine. Adults with asplenia or persistent complement component deficiencies should receive 2 doses of quadrivalent meningococcal conjugate (MenACWY-D) vaccine. The doses should be obtained   at least 2 months apart. Microbiologists working with certain meningococcal bacteria, [place] recruits, people at risk during an outbreak, and people who travel to or live in countries with a high rate of meningitis should be immunized. A first-year college student up through age 34 years who is living in a residence hall should receive a dose if she did not receive a dose on or after her 16th birthday. Adults who have certain high-risk conditions should receive one or more doses of vaccine.  Hepatitis A vaccine. Adults who wish to be protected from this disease, have certain high-risk conditions, work with hepatitis A-infected animals, work in hepatitis A research labs, or travel to or work in countries with a high rate of hepatitis A should be immunized. Adults who were previously unvaccinated and who anticipate close contact with an international adoptee during the first 60 days after arrival in the [place] from a country with a high rate of hepatitis A should be immunized.  Hepatitis B vaccine. Adults who wish to be protected from this disease, have certain high-risk conditions, may be exposed to blood or other infectious body fluids, are household contacts or sex partners of hepatitis B positive people, are clients or workers in certain care facilities, or travel to or work in countries with a high rate of hepatitis B should be immunized.  Haemophilus influenzae type b (Hib) vaccine. A previously unvaccinated person with asplenia or sickle cell disease or having a scheduled splenectomy should receive 1 dose of Hib vaccine. Regardless of previous immunization, a recipient of a hematopoietic stem cell transplant should receive a  3-dose series 6-12 months after her successful transplant. Hib vaccine is not recommended for adults with HIV infection. Preventive Services / Frequency Ages 35 to 4 years  Blood pressure check.** / Every 3-5 years.  Lipid and cholesterol check.** / Every 5 years beginning at age 60.  Clinical breast exam.** / Every 3 years for women in their 71s and 10s.  BRCA-related cancer risk assessment.** / For women who have family members with a BRCA-related cancer (breast, ovarian, tubal, or peritoneal cancers).  Pap test.** / Every 2 years from ages 76 through 26. Every 3 years starting at age 61 through age 76 or 93 with a history of 3 consecutive normal Pap tests.  HPV screening.** / Every 3 years from ages 37 through ages 60 to 51 with a history of 3 consecutive normal Pap tests.  Hepatitis C blood test.** / For any individual with known risks for hepatitis C.  Skin self-exam. / Monthly.  Influenza vaccine. / Every year.  Tetanus, diphtheria, and acellular pertussis (Tdap, Td) vaccine.** / Consult your health care provider. Pregnant women should receive 1 dose of Tdap vaccine during each pregnancy. 1 dose of Td every 10 years.  Varicella vaccine.** / Consult your health care provider. Pregnant females who do not have evidence of immunity should receive the first dose after pregnancy.  HPV vaccine. / 3 doses over 6 months, if 93 and younger. The vaccine is not recommended for use in pregnant females. However, pregnancy testing is not needed before receiving a dose.  Measles, mumps, rubella (MMR) vaccine.** / You need at least 1 dose of MMR if you were born in 1957 or later. You may also need a 2nd dose. For females of childbearing age, rubella immunity should be determined. If there is no evidence of immunity, females who are not pregnant should be vaccinated. If there is no evidence of immunity, females who are  pregnant should delay immunization until after pregnancy.  Pneumococcal  13-valent conjugate (PCV13) vaccine.** / Consult your health care provider.  Pneumococcal polysaccharide (PPSV23) vaccine.** / 1 to 2 doses if you smoke cigarettes or if you have certain conditions.  Meningococcal vaccine.** / 1 dose if you are age 68 to 8 years and a Market researcher living in a residence hall, or have one of several medical conditions, you need to get vaccinated against meningococcal disease. You may also need additional booster doses.  Hepatitis A vaccine.** / Consult your health care provider.  Hepatitis B vaccine.** / Consult your health care provider.  Haemophilus influenzae type b (Hib) vaccine.** / Consult your health care provider. Ages 7 to 53 years  Blood pressure check.** / Every year.  Lipid and cholesterol check.** / Every 5 years beginning at age 25 years.  Lung cancer screening. / Every year if you are aged 11-80 years and have a 30-pack-year history of smoking and currently smoke or have quit within the past 15 years. Yearly screening is stopped once you have quit smoking for at least 15 years or develop a health problem that would prevent you from having lung cancer treatment.  Clinical breast exam.** / Every year after age 48 years.  BRCA-related cancer risk assessment.** / For women who have family members with a BRCA-related cancer (breast, ovarian, tubal, or peritoneal cancers).  Mammogram.** / Every year beginning at age 41 years and continuing for as long as you are in good health. Consult with your health care provider.  Pap test.** / Every 3 years starting at age 65 years through age 37 or 70 years with a history of 3 consecutive normal Pap tests.  HPV screening.** / Every 3 years from ages 72 years through ages 60 to 40 years with a history of 3 consecutive normal Pap tests.  Fecal occult blood test (FOBT) of stool. / Every year beginning at age 21 years and continuing until age 5 years. You may not need to do this test if you get  a colonoscopy every 10 years.  Flexible sigmoidoscopy or colonoscopy.** / Every 5 years for a flexible sigmoidoscopy or every 10 years for a colonoscopy beginning at age 35 years and continuing until age 48 years.  Hepatitis C blood test.** / For all people born from [birth year] through 1965 and any individual with known risks for hepatitis C.  Skin self-exam. / Monthly.  Influenza vaccine. / Every year.  Tetanus, diphtheria, and acellular pertussis (Tdap/Td) vaccine.** / Consult your health care provider. Pregnant women should receive 1 dose of Tdap vaccine during each pregnancy. 1 dose of Td every 10 years.  Varicella vaccine.** / Consult your health care provider. Pregnant females who do not have evidence of immunity should receive the first dose after pregnancy.  Zoster vaccine.** / 1 dose for adults aged 30 years or older.  Measles, mumps, rubella (MMR) vaccine.** / You need at least 1 dose of MMR if you were born in 1957 or later. You may also need a second dose. For females of childbearing age, rubella immunity should be determined. If there is no evidence of immunity, females who are not pregnant should be vaccinated. If there is no evidence of immunity, females who are pregnant should delay immunization until after pregnancy.  Pneumococcal 13-valent conjugate (PCV13) vaccine.** / Consult your health care provider.  Pneumococcal polysaccharide (PPSV23) vaccine.** / 1 to 2 doses if you smoke cigarettes or if you have certain conditions.  Meningococcal vaccine.** /  Consult your health care provider.  Hepatitis A vaccine.** / Consult your health care provider.  Hepatitis B vaccine.** / Consult your health care provider.  Haemophilus influenzae type b (Hib) vaccine.** / Consult your health care provider. Ages 64 years and over  Blood pressure check.** / Every year.  Lipid and cholesterol check.** / Every 5 years beginning at age 23 years.  Lung cancer screening. / Every year if you  are aged 16-80 years and have a 30-pack-year history of smoking and currently smoke or have quit within the past 15 years. Yearly screening is stopped once you have quit smoking for at least 15 years or develop a health problem that would prevent you from having lung cancer treatment.  Clinical breast exam.** / Every year after age 74 years.  BRCA-related cancer risk assessment.** / For women who have family members with a BRCA-related cancer (breast, ovarian, tubal, or peritoneal cancers).  Mammogram.** / Every year beginning at age 44 years and continuing for as long as you are in good health. Consult with your health care provider.  Pap test.** / Every 3 years starting at age 58 years through age 22 or 39 years with 3 consecutive normal Pap tests. Testing can be stopped between 65 and 70 years with 3 consecutive normal Pap tests and no abnormal Pap or HPV tests in the past 10 years.  HPV screening.** / Every 3 years from ages 64 years through ages 70 or 61 years with a history of 3 consecutive normal Pap tests. Testing can be stopped between 65 and 70 years with 3 consecutive normal Pap tests and no abnormal Pap or HPV tests in the past 10 years.  Fecal occult blood test (FOBT) of stool. / Every year beginning at age 40 years and continuing until age 27 years. You may not need to do this test if you get a colonoscopy every 10 years.  Flexible sigmoidoscopy or colonoscopy.** / Every 5 years for a flexible sigmoidoscopy or every 10 years for a colonoscopy beginning at age 7 years and continuing until age 32 years.  Hepatitis C blood test.** / For all people born from [birth year] through 1965 and any individual with known risks for hepatitis C.  Osteoporosis screening.** / A one-time screening for women ages 30 years and over and women at risk for fractures or osteoporosis.  Skin self-exam. / Monthly.  Influenza vaccine. / Every year.  Tetanus, diphtheria, and acellular pertussis (Tdap/Td)  vaccine.** / 1 dose of Td every 10 years.  Varicella vaccine.** / Consult your health care provider.  Zoster vaccine.** / 1 dose for adults aged 35 years or older.  Pneumococcal 13-valent conjugate (PCV13) vaccine.** / Consult your health care provider.  Pneumococcal polysaccharide (PPSV23) vaccine.** / 1 dose for all adults aged 46 years and older.  Meningococcal vaccine.** / Consult your health care provider.  Hepatitis A vaccine.** / Consult your health care provider.  Hepatitis B vaccine.** / Consult your health care provider.  Haemophilus influenzae type b (Hib) vaccine.** / Consult your health care provider. ** Family history and personal history of risk and conditions may change your health care provider's recommendations.   This information is not intended to replace advice given to you by your health care provider. Make sure you discuss any questions you have with your health care provider.   Document Released: 04/05/2001 Document Revised: 02/28/2014 Document Reviewed: 07/05/2010 Elsevier Interactive Patient Education [employer].

## 2015-12-29 ENCOUNTER — Telehealth: Payer: Self-pay | Admitting: Family Medicine

## 2015-12-29 NOTE — Telephone Encounter (Signed)
Patient is requesting the self pay price of the shingles and flu shot. Please advise   Patient phone: [phone] (okay to LVM)

## 2015-12-30 NOTE — Telephone Encounter (Signed)
Left message on patient voicemail with requested information about say pay prices for vaccines. LB

## 2016-03-05 ENCOUNTER — Other Ambulatory Visit: Payer: Self-pay | Admitting: Family Medicine

## 2016-03-05 DIAGNOSIS — I10 Essential (primary) hypertension: Secondary | ICD-10-CM

## 2016-03-14 ENCOUNTER — Other Ambulatory Visit: Payer: Self-pay | Admitting: Family Medicine

## 2016-03-14 DIAGNOSIS — I10 Essential (primary) hypertension: Secondary | ICD-10-CM

## 2016-06-21 ENCOUNTER — Other Ambulatory Visit: Payer: Self-pay | Admitting: Family Medicine

## 2016-06-21 DIAGNOSIS — I10 Essential (primary) hypertension: Secondary | ICD-10-CM

## 2016-06-21 MED ORDER — LISINOPRIL-HYDROCHLOROTHIAZIDE 10-12.5 MG PO TABS: 1.0000 | ORAL_TABLET | Freq: Every day | ORAL | 0 refills | Status: DC

## 2016-09-13 ENCOUNTER — Telehealth: Payer: Self-pay | Admitting: Family Medicine

## 2016-09-13 DIAGNOSIS — I10 Essential (primary) hypertension: Secondary | ICD-10-CM

## 2016-09-13 NOTE — Telephone Encounter (Signed)
°  Relation to [facility] Call back [phone]  Reason for call:  Patient requesting a refill lisinopril-hydrochlorothiazide (PRINZIDE,ZESTORETIC) 10-12.5 MG tablet to hold her over until 10/20/16 follow up appointment, please advise

## 2016-09-14 MED ORDER — LISINOPRIL-HYDROCHLOROTHIAZIDE 10-12.5 MG PO TABS: 1.0000 | ORAL_TABLET | Freq: Every day | ORAL | 0 refills | Status: DC

## 2016-09-14 NOTE — Telephone Encounter (Signed)
Left message on machine that rx was sent in and she must keep appt.

## 2016-10-20 ENCOUNTER — Ambulatory Visit (INDEPENDENT_AMBULATORY_CARE_PROVIDER_SITE_OTHER): Payer: Self-pay | Admitting: Family Medicine

## 2016-10-20 ENCOUNTER — Encounter: Payer: Self-pay | Admitting: Family Medicine

## 2016-10-20 DIAGNOSIS — I10 Essential (primary) hypertension: Secondary | ICD-10-CM

## 2016-10-20 LAB — COMPREHENSIVE METABOLIC PANEL
ALT: 23 U/L (ref 0–35)
AST: 19 U/L (ref 0–37)
Albumin: 4.1 g/dL (ref 3.5–5.2)
Alkaline Phosphatase: 139 U/L — ABNORMAL HIGH (ref 39–117)
BILIRUBIN TOTAL: 1.2 mg/dL (ref 0.2–1.2)
BUN: 11 mg/dL (ref 6–23)
CHLORIDE: 100 meq/L (ref 96–112)
CO2: 28 meq/L (ref 19–32)
Calcium: 9.5 mg/dL (ref 8.4–10.5)
Creatinine, Ser: 0.76 mg/dL (ref 0.40–1.20)
GFR: 82.06 mL/min (ref >60.00)
GLUCOSE: 206 mg/dL — AB (ref 70–99)
POTASSIUM: 3.9 meq/L (ref 3.5–5.1)
Sodium: 133 mEq/L — ABNORMAL LOW (ref 135–145)
Total Protein: 7.5 g/dL (ref 6.0–8.3)

## 2016-10-20 LAB — LIPID PANEL
CHOL/HDL RATIO: 5
Cholesterol: 150 mg/dL (ref 0–200)
HDL: 32.2 mg/dL — AB (ref >39.00)
LDL Cholesterol: 97 mg/dL (ref 0–99)
NONHDL: 117.62
Triglycerides: 105 mg/dL (ref 0.0–149.0)
VLDL: 21 mg/dL (ref 0.0–40.0)

## 2016-10-20 MED ORDER — LISINOPRIL-HYDROCHLOROTHIAZIDE 10-12.5 MG PO TABS: 1.0000 | ORAL_TABLET | Freq: Every day | ORAL | 1 refills | Status: DC

## 2016-10-20 NOTE — Patient Instructions (Signed)

## 2016-10-20 NOTE — Progress Notes (Signed)
Patient ID: Beth Sanchez, female    DOB: February 10, 1956  Age: 61 y.o. MRN: [id number]    Subjective:  Subjective  HPI Erline Siddoway presents for f/u bp.  She is under a lot of stress   No cp, no sob.  No other complaints.    Review of Systems  Constitutional: Negative for activity change, appetite change, diaphoresis, fatigue and unexpected weight change.  Eyes: Negative for pain, redness and visual disturbance.  Respiratory: Negative for cough, chest tightness, shortness of breath and wheezing.   Cardiovascular: Negative for chest pain, palpitations and leg swelling.  Endocrine: Negative for cold intolerance, heat intolerance, polydipsia, polyphagia and polyuria.  Genitourinary: Negative for difficulty urinating, dysuria and frequency.  Neurological: Negative for dizziness, light-headedness, numbness and headaches.  Psychiatric/Behavioral: Negative for behavioral problems and dysphoric mood. The patient is not nervous/anxious.     History Past Medical History:  Diagnosis Date  . Hypertension   . Psoriasis     She has no past surgical history on file.   Her family history includes Cancer in her unknown relative; Colon cancer in her unknown relative; Diabetes in her unknown relative; Heart attack (age of onset: 18) in her maternal grandfather; Hypertension in her unknown relative; Lung cancer in her unknown relative; Stroke in her mother; Thyroid disease in her unknown relative.She reports that she has never smoked. She has never used smokeless tobacco. She reports that she does not drink alcohol or use drugs.  No current outpatient prescriptions on file prior to visit.   No current facility-administered medications on file prior to visit.      Objective:  Objective  Physical Exam  Constitutional: She is oriented to person, place, and time. She appears well-developed and well-nourished.  HENT:  Head: Normocephalic and atraumatic.  Eyes: Conjunctivae and EOM are normal.  Neck:  Normal range of motion. Neck supple. No JVD present. Carotid bruit is not present. No thyromegaly present.  Cardiovascular: Normal rate, regular rhythm and normal heart sounds.   No murmur heard. Pulmonary/Chest: Effort normal and breath sounds normal. No respiratory distress. She has no wheezes. She has no rales. She exhibits no tenderness.  Musculoskeletal: She exhibits no edema.  Neurological: She is alert and oriented to person, place, and time.  Psychiatric: She has a normal mood and affect. Her behavior is normal. Judgment and thought content normal.  Nursing note and vitals reviewed.  BP 132/82 (BP Location: Right Arm, Patient Position: Sitting, Cuff Size: Normal)   Pulse 80   Ht 5\' 6"  (1.676 m)   Wt 259 lb 12.8 oz (117.8 kg)   SpO2 97%   BMI 41.93 kg/m  Wt Readings from Last 3 Encounters:  10/20/16 259 lb 12.8 oz (117.8 kg)  06/22/15 266 lb 12.8 oz (121 kg)  07/08/14 281 lb 3.2 oz (127.6 kg)     Lab Results  Component Value Date   WBC 5.1 06/22/2015   HGB 14.7 06/22/2015   HCT 43.5 06/22/2015   PLT 173.0 06/22/2015   GLUCOSE 315 (H) 06/22/2015   CHOL 148 06/22/2015   TRIG 130.0 06/22/2015   HDL 32.10 (L) 06/22/2015   LDLCALC 90 06/22/2015   ALT 24 06/22/2015   AST 21 06/22/2015   NA 131 (L) 06/22/2015   K 4.1 06/22/2015   CL 98 06/22/2015   CREATININE 0.75 06/22/2015   BUN 14 06/22/2015   CO2 25 06/22/2015   TSH 1.90 06/22/2015   HGBA1C 5.9 01/09/2009   MICROALBUR 1.1 07/08/2014    No  results found.   Assessment & Plan:  Plan  I have discontinued Ms. Norberto's diclofenac sodium. I am also having her maintain her lisinopril-hydrochlorothiazide.  Meds ordered this encounter  Medications  . DISCONTD: lisinopril-hydrochlorothiazide (PRINZIDE,ZESTORETIC) 10-12.5 MG tablet    Sig: Take 1 tablet by mouth daily.    Dispense:  90 tablet    Refill:  1  . lisinopril-hydrochlorothiazide (PRINZIDE,ZESTORETIC) 10-12.5 MG tablet    Sig: Take 1 tablet by mouth  daily.    Dispense:  90 tablet    Refill:  1    Problem List Items Addressed This Visit      Unprioritized   Essential hypertension   Relevant Medications   lisinopril-hydrochlorothiazide (PRINZIDE,ZESTORETIC) 10-12.5 MG tablet   Other Relevant Orders   Lipid panel   Comprehensive metabolic panel           Stable Cont meds   Follow-up: Return in about 6 months (around 04/20/2017) for annual exam, fasting.  Ann Held, DO

## 2016-10-23 ENCOUNTER — Encounter: Payer: Self-pay | Admitting: Family Medicine

## 2016-10-23 DIAGNOSIS — E1169 Type 2 diabetes mellitus with other specified complication: Secondary | ICD-10-CM

## 2016-10-23 DIAGNOSIS — E785 Hyperlipidemia, unspecified: Secondary | ICD-10-CM | POA: Insufficient documentation

## 2016-10-23 HISTORY — DX: Type 2 diabetes mellitus with other specified complication: E11.69

## 2016-10-25 ENCOUNTER — Other Ambulatory Visit: Payer: Self-pay | Admitting: Family Medicine

## 2016-10-25 DIAGNOSIS — E1169 Type 2 diabetes mellitus with other specified complication: Secondary | ICD-10-CM

## 2016-10-25 DIAGNOSIS — E785 Hyperlipidemia, unspecified: Principal | ICD-10-CM

## 2016-10-25 DIAGNOSIS — R7309 Other abnormal glucose: Secondary | ICD-10-CM

## 2016-10-28 ENCOUNTER — Ambulatory Visit: Payer: Self-pay

## 2016-11-07 MED ORDER — LISINOPRIL-HYDROCHLOROTHIAZIDE 10-12.5 MG PO TABS: 1.0000 | ORAL_TABLET | Freq: Every day | ORAL | 1 refills | Status: DC

## 2016-11-07 NOTE — Addendum Note (Signed)
Addended by: Marrion Coy on: 11/07/2016 03:45 PM   Modules accepted: Orders

## 2018-01-09 ENCOUNTER — Other Ambulatory Visit: Payer: Self-pay | Admitting: Family Medicine

## 2018-01-09 DIAGNOSIS — I10 Essential (primary) hypertension: Secondary | ICD-10-CM

## 2018-01-09 NOTE — Telephone Encounter (Signed)
Copied from [place] [id number]. Topic: Quick Communication - See Telephone Encounter >> Jan 09, 2018  9:57 AM Conception Chancy, NT wrote: CRM for notification. See Telephone encounter for: 01/09/18.  Patient is calling and requesting a refill on lisinopril-hydrochlorothiazide (PRINZIDE,ZESTORETIC) 10-12.5 MG tablet. She has a medication follow up appointment on 01/16/18 but states she will run out on 01/14/18. Please advise.  [id number] [place], [place] - [address] [place] [address] Phone: [phone] Fax: [phone]

## 2018-01-10 MED ORDER — LISINOPRIL-HYDROCHLOROTHIAZIDE 10-12.5 MG PO TABS: 1.0000 | ORAL_TABLET | Freq: Every day | ORAL | 0 refills | Status: DC

## 2018-01-16 ENCOUNTER — Ambulatory Visit (INDEPENDENT_AMBULATORY_CARE_PROVIDER_SITE_OTHER): Payer: Self-pay | Admitting: Family Medicine

## 2018-01-16 ENCOUNTER — Encounter: Payer: Self-pay | Admitting: Family Medicine

## 2018-01-16 VITALS — BP 124/69 | HR 82 | Temp 97.7°F | Resp 16 | Ht 66.0 in | Wt 250.0 lb

## 2018-01-16 DIAGNOSIS — I10 Essential (primary) hypertension: Secondary | ICD-10-CM

## 2018-01-16 DIAGNOSIS — E1169 Type 2 diabetes mellitus with other specified complication: Secondary | ICD-10-CM

## 2018-01-16 LAB — COMPREHENSIVE METABOLIC PANEL
ALK PHOS: 126 U/L — AB (ref 39–117)
ALT: 19 U/L (ref 0–35)
AST: 16 U/L (ref 0–37)
Albumin: 4.2 g/dL (ref 3.5–5.2)
BUN: 13 mg/dL (ref 6–23)
CHLORIDE: 98 meq/L (ref 96–112)
CO2: 27 meq/L (ref 19–32)
Calcium: 9.4 mg/dL (ref 8.4–10.5)
Creatinine, Ser: 0.75 mg/dL (ref 0.40–1.20)
GFR: 82.99 mL/min (ref >60.00)
GLUCOSE: 263 mg/dL — AB (ref 70–99)
POTASSIUM: 4.2 meq/L (ref 3.5–5.1)
SODIUM: 132 meq/L — AB (ref 135–145)
TOTAL PROTEIN: 7.6 g/dL (ref 6.0–8.3)
Total Bilirubin: 1.2 mg/dL (ref 0.2–1.2)

## 2018-01-16 LAB — LIPID PANEL
CHOL/HDL RATIO: 5
Cholesterol: 153 mg/dL (ref 0–200)
HDL: 32.5 mg/dL — ABNORMAL LOW (ref >39.00)
LDL Cholesterol: 93 mg/dL (ref 0–99)
NONHDL: 120.5
Triglycerides: 137 mg/dL (ref 0.0–149.0)
VLDL: 27.4 mg/dL (ref 0.0–40.0)

## 2018-01-16 LAB — HEMOGLOBIN A1C: HEMOGLOBIN A1C: 10.7 % — AB (ref 4.6–6.5)

## 2018-01-16 MED ORDER — LISINOPRIL-HYDROCHLOROTHIAZIDE 10-12.5 MG PO TABS: 1.0000 | ORAL_TABLET | Freq: Every day | ORAL | 1 refills | Status: DC

## 2018-01-16 NOTE — Progress Notes (Signed)
Patient ID: Beth Sanchez, female    DOB: 07/03/1955  Age: 62 y.o. MRN: [id number]    Subjective:  Subjective  HPI Mahdiya Mossberg presents for bp f/u.  No complaints.  She is active with swimming and a 49 yo cousin moved here from [place].    Review of Systems  Constitutional: Negative for appetite change, diaphoresis, fatigue and unexpected weight change.  Eyes: Negative for pain, redness and visual disturbance.  Respiratory: Negative for cough, chest tightness, shortness of breath and wheezing.   Cardiovascular: Negative for chest pain, palpitations and leg swelling.  Endocrine: Negative for cold intolerance, heat intolerance, polydipsia, polyphagia and polyuria.  Genitourinary: Negative for difficulty urinating, dysuria and frequency.  Neurological: Negative for dizziness, light-headedness, numbness and headaches.    History Past Medical History:  Diagnosis Date  . Hypertension   . Psoriasis     She has no past surgical history on file.   Her family history includes Cancer in her unknown relative; Colon cancer in her unknown relative; Diabetes in her unknown relative; Heart attack (age of onset: 49) in her maternal grandfather; Hypertension in her unknown relative; Lung cancer in her unknown relative; Stroke in her mother; Thyroid disease in her unknown relative.She reports that she has never smoked. She has never used smokeless tobacco. She reports that she does not drink alcohol or use drugs.  No current outpatient medications on file prior to visit.   No current facility-administered medications on file prior to visit.      Objective:  Objective  Physical Exam  Constitutional: She is oriented to person, place, and time. She appears well-developed and well-nourished.  HENT:  Head: Normocephalic and atraumatic.  Eyes: Conjunctivae and EOM are normal.  Neck: Normal range of motion. Neck supple. No JVD present. Carotid bruit is not present. No thyromegaly present.    Cardiovascular: Normal rate, regular rhythm and normal heart sounds.  No murmur heard. Pulmonary/Chest: Effort normal and breath sounds normal. No respiratory distress. She has no wheezes. She has no rales. She exhibits no tenderness.  Musculoskeletal: She exhibits no edema.  Neurological: She is alert and oriented to person, place, and time.  Psychiatric: She has a normal mood and affect.  Nursing note and vitals reviewed.  Diabetic Foot Exam - Simple   Simple Foot Form Diabetic Foot exam was performed with the following findings:  Yes 01/16/2018 11:13 AM  Visual Inspection No deformities, no ulcerations, no other skin breakdown bilaterally:  Yes Sensation Testing Intact to touch and monofilament testing bilaterally:  Yes Pulse Check Posterior Tibialis and Dorsalis pulse intact bilaterally:  Yes Comments     BP 124/69 (BP Location: Right Arm, Patient Position: Sitting, Cuff Size: Large)   Pulse 82   Temp 97.7 F (36.5 C) (Oral)   Resp 16   Ht 5\' 6"  (1.676 m)   Wt 250 lb (113.4 kg)   SpO2 100%   BMI 40.35 kg/m  Wt Readings from Last 3 Encounters:  01/16/18 250 lb (113.4 kg)  10/20/16 259 lb 12.8 oz (117.8 kg)  06/22/15 266 lb 12.8 oz (121 kg)     Lab Results  Component Value Date   WBC 5.1 06/22/2015   HGB 14.7 06/22/2015   HCT 43.5 06/22/2015   PLT 173.0 06/22/2015   GLUCOSE 206 (H) 10/20/2016   CHOL 150 10/20/2016   TRIG 105.0 10/20/2016   HDL 32.20 (L) 10/20/2016   LDLCALC 97 10/20/2016   ALT 23 10/20/2016   AST 19 10/20/2016   NA  133 (L) 10/20/2016   K 3.9 10/20/2016   CL 100 10/20/2016   CREATININE 0.76 10/20/2016   BUN 11 10/20/2016   CO2 28 10/20/2016   TSH 1.90 06/22/2015   HGBA1C 5.9 01/09/2009   MICROALBUR 1.1 07/08/2014    No results found.   Assessment & Plan:  Plan  I am having Paelyn Smick maintain her lisinopril-hydrochlorothiazide.  Meds ordered this encounter  Medications  . lisinopril-hydrochlorothiazide (PRINZIDE,ZESTORETIC)  10-12.5 MG tablet    Sig: Take 1 tablet by mouth daily.    Dispense:  90 tablet    Refill:  1    Problem List Items Addressed This Visit      Unprioritized   Essential hypertension - Primary    Well controlled, no changes to meds. Encouraged heart healthy diet such as the DASH diet and exercise as tolerated.        Relevant Medications   lisinopril-hydrochlorothiazide (PRINZIDE,ZESTORETIC) 10-12.5 MG tablet   Other Relevant Orders   Hemoglobin A1c   Lipid panel   Comprehensive metabolic panel   Type 2 diabetes mellitus with other specified complication (HCC)    FYTW4M to be checked , minimize simple carbs. Increase exercise as tolerated. Continue current meds      Relevant Medications   lisinopril-hydrochlorothiazide (PRINZIDE,ZESTORETIC) 10-12.5 MG tablet   Other Relevant Orders   Hemoglobin A1c   Lipid panel   Comprehensive metabolic panel      Follow-up: Return in about 6 months (around 07/17/2018) for annual exam, fasting.  Ann Held, DO

## 2018-01-16 NOTE — Assessment & Plan Note (Signed)
hgba1c to be checked, minimize simple carbs. Increase exercise as tolerated. Continue current meds  

## 2018-01-16 NOTE — Assessment & Plan Note (Signed)
Well controlled, no changes to meds. Encouraged heart healthy diet such as the DASH diet and exercise as tolerated.  °

## 2018-01-16 NOTE — Patient Instructions (Signed)
DASH Eating Plan DASH stands for "Dietary Approaches to Stop Hypertension." The DASH eating plan is a healthy eating plan that has been shown to reduce high blood pressure (hypertension). It may also reduce your risk for type 2 diabetes, heart disease, and stroke. The DASH eating plan may also help with weight loss. What are tips for following this plan? General guidelines  Avoid eating more than 2,300 mg (milligrams) of salt (sodium) a day. If you have hypertension, you may need to reduce your sodium intake to 1,500 mg a day.  Limit alcohol intake to no more than 1 drink a day for nonpregnant women and 2 drinks a day for men. One drink equals 12 oz of beer, 5 oz of wine, or 1 oz of hard liquor.  Work with your health care provider to maintain a healthy body weight or to lose weight. Ask what an ideal weight is for you.  Get at least 30 minutes of exercise that causes your heart to beat faster (aerobic exercise) most days of the week. Activities may include walking, swimming, or biking.  Work with your health care provider or diet and nutrition specialist (dietitian) to adjust your eating plan to your individual calorie needs. Reading food labels  Check food labels for the amount of sodium per serving. Choose foods with less than 5 percent of the Daily Value of sodium. Generally, foods with less than 300 mg of sodium per serving fit into this eating plan.  To find whole grains, look for the word "whole" as the first word in the ingredient list. Shopping  Buy products labeled as "low-sodium" or "no salt added."  Buy fresh foods. Avoid canned foods and premade or frozen meals. Cooking  Avoid adding salt when cooking. Use salt-free seasonings or herbs instead of table salt or sea salt. Check with your health care provider or pharmacist before using salt substitutes.  Do not fry foods. Cook foods using healthy methods such as baking, boiling, grilling, and broiling instead.  Cook with  heart-healthy oils, such as olive, canola, soybean, or sunflower oil. Meal planning   Eat a balanced diet that includes: ? 5 or more servings of fruits and vegetables each day. At each meal, try to fill half of your plate with fruits and vegetables. ? Up to 6-8 servings of whole grains each day. ? Less than 6 oz of lean meat, poultry, or fish each day. A 3-oz serving of meat is about the same size as a deck of cards. One egg equals 1 oz. ? 2 servings of low-fat dairy each day. ? A serving of nuts, seeds, or beans 5 times each week. ? Heart-healthy fats. Healthy fats called Omega-3 fatty acids are found in foods such as flaxseeds and coldwater fish, like sardines, salmon, and mackerel.  Limit how much you eat of the following: ? Canned or prepackaged foods. ? Food that is high in trans fat, such as fried foods. ? Food that is high in saturated fat, such as fatty meat. ? Sweets, desserts, sugary drinks, and other foods with added sugar. ? Full-fat dairy products.  Do not salt foods before eating.  Try to eat at least 2 vegetarian meals each week.  Eat more home-cooked food and less restaurant, buffet, and fast food.  When eating at a restaurant, ask that your food be prepared with less salt or no salt, if possible. What foods are recommended? The items listed may not be a complete list. Talk with your dietitian about what   dietary choices are best for you. Grains Whole-grain or whole-wheat bread. Whole-grain or whole-wheat pasta. Brown rice. Oatmeal. Quinoa. Bulgur. Whole-grain and low-sodium cereals. Pita bread. Low-fat, low-sodium crackers. Whole-wheat flour tortillas. Vegetables Fresh or frozen vegetables (raw, steamed, roasted, or grilled). Low-sodium or reduced-sodium tomato and vegetable juice. Low-sodium or reduced-sodium tomato sauce and tomato paste. Low-sodium or reduced-sodium canned vegetables. Fruits All fresh, dried, or frozen fruit. Canned fruit in natural juice (without  added sugar). Meat and other protein foods Skinless chicken or turkey. Ground chicken or turkey. Pork with fat trimmed off. Fish and seafood. Egg whites. Dried beans, peas, or lentils. Unsalted nuts, nut butters, and seeds. Unsalted canned beans. Lean cuts of beef with fat trimmed off. Low-sodium, lean deli meat. Dairy Low-fat (1%) or fat-free (skim) milk. Fat-free, low-fat, or reduced-fat cheeses. Nonfat, low-sodium ricotta or cottage cheese. Low-fat or nonfat yogurt. Low-fat, low-sodium cheese. Fats and oils Soft margarine without trans fats. Vegetable oil. Low-fat, reduced-fat, or light mayonnaise and salad dressings (reduced-sodium). Canola, safflower, olive, soybean, and sunflower oils. Avocado. Seasoning and other foods Herbs. Spices. Seasoning mixes without salt. Unsalted popcorn and pretzels. Fat-free sweets. What foods are not recommended? The items listed may not be a complete list. Talk with your dietitian about what dietary choices are best for you. Grains Baked goods made with fat, such as croissants, muffins, or some breads. Dry pasta or rice meal packs. Vegetables Creamed or fried vegetables. Vegetables in a cheese sauce. Regular canned vegetables (not low-sodium or reduced-sodium). Regular canned tomato sauce and paste (not low-sodium or reduced-sodium). Regular tomato and vegetable juice (not low-sodium or reduced-sodium). Pickles. Olives. Fruits Canned fruit in a light or heavy syrup. Fried fruit. Fruit in cream or butter sauce. Meat and other protein foods Fatty cuts of meat. Ribs. Fried meat. Bacon. Sausage. Bologna and other processed lunch meats. Salami. Fatback. Hotdogs. Bratwurst. Salted nuts and seeds. Canned beans with added salt. Canned or smoked fish. Whole eggs or egg yolks. Chicken or turkey with skin. Dairy Whole or 2% milk, cream, and half-and-half. Whole or full-fat cream cheese. Whole-fat or sweetened yogurt. Full-fat cheese. Nondairy creamers. Whipped toppings.  Processed cheese and cheese spreads. Fats and oils Butter. Stick margarine. Lard. Shortening. Ghee. Bacon fat. Tropical oils, such as coconut, palm kernel, or palm oil. Seasoning and other foods Salted popcorn and pretzels. Onion salt, garlic salt, seasoned salt, table salt, and sea salt. Worcestershire sauce. Tartar sauce. Barbecue sauce. Teriyaki sauce. Soy sauce, including reduced-sodium. Steak sauce. Canned and packaged gravies. Fish sauce. Oyster sauce. Cocktail sauce. Horseradish that you find on the shelf. Ketchup. Mustard. Meat flavorings and tenderizers. Bouillon cubes. Hot sauce and Tabasco sauce. Premade or packaged marinades. Premade or packaged taco seasonings. Relishes. Regular salad dressings. Where to find more information:  National Heart, Lung, and Blood Institute: www.nhlbi.nih.gov  American Heart Association: www.heart.org Summary  The DASH eating plan is a healthy eating plan that has been shown to reduce high blood pressure (hypertension). It may also reduce your risk for type 2 diabetes, heart disease, and stroke.  With the DASH eating plan, you should limit salt (sodium) intake to 2,300 mg a day. If you have hypertension, you may need to reduce your sodium intake to 1,500 mg a day.  When on the DASH eating plan, aim to eat more fresh fruits and vegetables, whole grains, lean proteins, low-fat dairy, and heart-healthy fats.  Work with your health care provider or diet and nutrition specialist (dietitian) to adjust your eating plan to your individual   calorie needs. This information is not intended to replace advice given to you by your health care provider. Make sure you discuss any questions you have with your health care provider. Document Released: 01/27/2011 Document Revised: 02/01/2016 Document Reviewed: 02/01/2016 Elsevier Interactive Patient Education  2018 [employer]  

## 2018-02-08 MED ORDER — METFORMIN HCL 500 MG PO TABS: 500.0000 mg | ORAL_TABLET | Freq: Every day | ORAL | 3 refills | Status: DC

## 2018-02-08 NOTE — Addendum Note (Signed)
Addended by: Magdalene Molly A on: 02/08/2018 10:16 AM   Modules accepted: Orders

## 2018-05-30 ENCOUNTER — Other Ambulatory Visit: Payer: Self-pay | Admitting: *Deleted

## 2018-05-30 MED ORDER — METFORMIN HCL 500 MG PO TABS: 500.0000 mg | ORAL_TABLET | Freq: Every day | ORAL | 1 refills | Status: DC

## 2018-08-01 ENCOUNTER — Other Ambulatory Visit: Payer: Self-pay | Admitting: *Deleted

## 2018-08-01 DIAGNOSIS — I10 Essential (primary) hypertension: Secondary | ICD-10-CM

## 2018-08-01 MED ORDER — LISINOPRIL-HYDROCHLOROTHIAZIDE 10-12.5 MG PO TABS: 1.0000 | ORAL_TABLET | Freq: Every day | ORAL | 0 refills | Status: DC

## 2018-10-31 ENCOUNTER — Other Ambulatory Visit: Payer: Self-pay | Admitting: Family Medicine

## 2018-10-31 DIAGNOSIS — I10 Essential (primary) hypertension: Secondary | ICD-10-CM

## 2018-11-20 ENCOUNTER — Other Ambulatory Visit: Payer: Self-pay | Admitting: Family Medicine

## 2018-11-20 DIAGNOSIS — I10 Essential (primary) hypertension: Secondary | ICD-10-CM

## 2018-11-20 MED ORDER — LISINOPRIL-HYDROCHLOROTHIAZIDE 10-12.5 MG PO TABS: 1.0000 | ORAL_TABLET | Freq: Every day | ORAL | 0 refills | Status: DC

## 2018-11-26 ENCOUNTER — Other Ambulatory Visit: Payer: Self-pay

## 2018-11-26 ENCOUNTER — Ambulatory Visit (INDEPENDENT_AMBULATORY_CARE_PROVIDER_SITE_OTHER): Payer: Self-pay | Admitting: Family Medicine

## 2018-11-26 ENCOUNTER — Encounter: Payer: Self-pay | Admitting: Family Medicine

## 2018-11-26 VITALS — BP 140/68 | HR 70 | Temp 97.2°F | Resp 18 | Ht 66.0 in | Wt 234.0 lb

## 2018-11-26 DIAGNOSIS — E1165 Type 2 diabetes mellitus with hyperglycemia: Secondary | ICD-10-CM

## 2018-11-26 DIAGNOSIS — E1169 Type 2 diabetes mellitus with other specified complication: Secondary | ICD-10-CM

## 2018-11-26 DIAGNOSIS — I10 Essential (primary) hypertension: Secondary | ICD-10-CM

## 2018-11-26 LAB — COMPREHENSIVE METABOLIC PANEL
ALT: 23 U/L (ref 0–35)
AST: 18 U/L (ref 0–37)
Albumin: 3.9 g/dL (ref 3.5–5.2)
Alkaline Phosphatase: 107 U/L (ref 39–117)
BUN: 15 mg/dL (ref 6–23)
CO2: 28 mEq/L (ref 19–32)
Calcium: 9.5 mg/dL (ref 8.4–10.5)
Chloride: 102 mEq/L (ref 96–112)
Creatinine, Ser: 0.77 mg/dL (ref 0.40–1.20)
GFR: 75.54 mL/min (ref >60.00)
Glucose, Bld: 144 mg/dL — ABNORMAL HIGH (ref 70–99)
Potassium: 4.2 mEq/L (ref 3.5–5.1)
Sodium: 138 mEq/L (ref 135–145)
Total Bilirubin: 0.9 mg/dL (ref 0.2–1.2)
Total Protein: 7.1 g/dL (ref 6.0–8.3)

## 2018-11-26 LAB — LIPID PANEL
Cholesterol: 164 mg/dL (ref 0–200)
HDL: 37 mg/dL — ABNORMAL LOW (ref >39.00)
LDL Cholesterol: 105 mg/dL — ABNORMAL HIGH (ref 0–99)
NonHDL: 127.02
Total CHOL/HDL Ratio: 4
Triglycerides: 111 mg/dL (ref 0.0–149.0)
VLDL: 22.2 mg/dL (ref 0.0–40.0)

## 2018-11-26 LAB — MICROALBUMIN / CREATININE URINE RATIO
Creatinine,U: 53.9 mg/dL
Microalb Creat Ratio: 2.7 mg/g (ref 0.0–30.0)
Microalb, Ur: 1.5 mg/dL (ref 0.0–1.9)

## 2018-11-26 LAB — HEMOGLOBIN A1C: Hgb A1c MFr Bld: 6.7 % — ABNORMAL HIGH (ref 4.6–6.5)

## 2018-11-26 MED ORDER — LISINOPRIL-HYDROCHLOROTHIAZIDE 10-12.5 MG PO TABS: 2.0000 | ORAL_TABLET | Freq: Every day | ORAL | 1 refills | Status: DC

## 2018-11-26 MED ORDER — LISINOPRIL-HYDROCHLOROTHIAZIDE 10-12.5 MG PO TABS: 1.0000 | ORAL_TABLET | Freq: Every day | ORAL | 1 refills | Status: DC

## 2018-11-26 MED ORDER — METFORMIN HCL 500 MG PO TABS: 500.0000 mg | ORAL_TABLET | Freq: Every day | ORAL | 1 refills | Status: DC

## 2018-11-26 NOTE — Patient Instructions (Signed)

## 2018-11-26 NOTE — Assessment & Plan Note (Signed)
Poorly controlled will alter medications, encouraged DASH diet, minimize caffeine and obtain adequate sleep. Report concerning symptoms and follow up as directed and as needed 

## 2018-11-26 NOTE — Progress Notes (Signed)
Patient ID: Beth Sanchez, female    DOB: 1955/02/25  Age: 63 y.o. MRN: [id number]    Subjective:  Subjective  HPI Beth Sanchez presents for f/u dm and bp.  No complaints   HYPERTENSION   Blood pressure range-not checking   Chest pain- no      Dyspnea- no Lightheadedness- no   Edema- no  Other side effects - no   Medication compliance: good Low salt diet- yes    DIABETES    Blood Sugar ranges-not checking   Polyuria- no New Visual problems- no  Hypoglycemic symptoms- no  Other side effects-no Medication compliance - good Last eye exam- jan/ feb 2020 Foot exam- today        Review of Systems  Constitutional: Negative for appetite change, diaphoresis, fatigue and unexpected weight change.  Eyes: Negative for pain, redness and visual disturbance.  Respiratory: Negative for cough, chest tightness, shortness of breath and wheezing.   Cardiovascular: Negative for chest pain, palpitations and leg swelling.  Endocrine: Negative for cold intolerance, heat intolerance, polydipsia, polyphagia and polyuria.  Genitourinary: Negative for difficulty urinating, dysuria and frequency.  Neurological: Negative for dizziness, light-headedness, numbness and headaches.    History Past Medical History:  Diagnosis Date  . Hypertension   . Psoriasis     She has no past surgical history on file.   Her family history includes Cancer in her unknown relative; Colon cancer in her unknown relative; Diabetes in her unknown relative; Heart attack (age of onset: 80) in her maternal grandfather; Hypertension in her unknown relative; Lung cancer in her unknown relative; Stroke in her mother; Thyroid disease in her unknown relative.She reports that she has never smoked. She has never used smokeless tobacco. She reports that she does not drink alcohol or use drugs.  No current outpatient medications on file prior to visit.   No current facility-administered medications on file prior to visit.       Objective:  Objective  Physical Exam Vitals signs and nursing note reviewed.  Constitutional:      Appearance: She is well-developed.  HENT:     Head: Normocephalic and atraumatic.  Eyes:     Conjunctiva/sclera: Conjunctivae normal.  Neck:     Musculoskeletal: Normal range of motion and neck supple.     Thyroid: No thyromegaly.     Vascular: No carotid bruit or JVD.  Cardiovascular:     Rate and Rhythm: Normal rate and regular rhythm.     Heart sounds: Normal heart sounds. No murmur.  Pulmonary:     Effort: Pulmonary effort is normal. No respiratory distress.     Breath sounds: Normal breath sounds. No wheezing or rales.  Chest:     Chest wall: No tenderness.  Neurological:     Mental Status: She is alert and oriented to person, place, and time.    Diabetic Foot Exam - Simple   Simple Foot Form Diabetic Foot exam was performed with the following findings: Yes 11/26/2018 10:03 AM  Visual Inspection No deformities, no ulcerations, no other skin breakdown bilaterally: Yes Sensation Testing Intact to touch and monofilament testing bilaterally: Yes Pulse Check Posterior Tibialis and Dorsalis pulse intact bilaterally: Yes Comments      BP 140/68 (BP Location: Right Arm, Patient Position: Sitting, Cuff Size: Large)   Pulse 70   Temp (!) 97.2 F (36.2 C) (Temporal)   Resp 18   Ht 5\' 6"  (1.676 m)   Wt 234 lb (106.1 kg)   SpO2 98%  BMI 37.77 kg/m  Wt Readings from Last 3 Encounters:  11/26/18 234 lb (106.1 kg)  01/16/18 250 lb (113.4 kg)  10/20/16 259 lb 12.8 oz (117.8 kg)     Lab Results  Component Value Date   WBC 5.1 06/22/2015   HGB 14.7 06/22/2015   HCT 43.5 06/22/2015   PLT 173.0 06/22/2015   GLUCOSE 263 (H) 01/16/2018   CHOL 153 01/16/2018   TRIG 137.0 01/16/2018   HDL 32.50 (L) 01/16/2018   LDLCALC 93 01/16/2018   ALT 19 01/16/2018   AST 16 01/16/2018   NA 132 (L) 01/16/2018   K 4.2 01/16/2018   CL 98 01/16/2018   CREATININE 0.75 01/16/2018    BUN 13 01/16/2018   CO2 27 01/16/2018   TSH 1.90 06/22/2015   HGBA1C 10.7 (H) 01/16/2018   MICROALBUR 1.1 07/08/2014    No results found.   Assessment & Plan:  Plan  I have discontinued Shewanda Soliday's lisinopril-hydrochlorothiazide. I have also changed her lisinopril-hydrochlorothiazide. Additionally, I am having her maintain her metFORMIN.  Meds ordered this encounter  Medications  . DISCONTD: lisinopril-hydrochlorothiazide (ZESTORETIC) 10-12.5 MG tablet    Sig: Take 1 tablet by mouth daily. Needs ov before any more refills    Dispense:  90 tablet    Refill:  1  . metFORMIN (GLUCOPHAGE) 500 MG tablet    Sig: Take 1 tablet (500 mg total) by mouth daily.    Dispense:  90 tablet    Refill:  1  . lisinopril-hydrochlorothiazide (ZESTORETIC) 10-12.5 MG tablet    Sig: Take 2 tablets by mouth daily. Needs ov before any more refills    Dispense:  180 tablet    Refill:  1    Problem List Items Addressed This Visit      Unprioritized   Essential hypertension    Poorly controlled will alter medications, encouraged DASH diet, minimize caffeine and obtain adequate sleep. Report concerning symptoms and follow up as directed and as needed      Relevant Medications   lisinopril-hydrochlorothiazide (ZESTORETIC) 10-12.5 MG tablet   Other Relevant Orders   Lipid panel   Comprehensive metabolic panel   Microalbumin / creatinine urine ratio   Type 2 diabetes mellitus with other specified complication (HCC)    [id number] to be checked , minimize simple carbs. Increase exercise as tolerated. Continue current meds       Relevant Medications   metFORMIN (GLUCOPHAGE) 500 MG tablet   lisinopril-hydrochlorothiazide (ZESTORETIC) 10-12.5 MG tablet    Other Visit Diagnoses    Uncontrolled type 2 diabetes mellitus with hyperglycemia (HCC)    -  Primary   Relevant Medications   metFORMIN (GLUCOPHAGE) 500 MG tablet   lisinopril-hydrochlorothiazide (ZESTORETIC) 10-12.5 MG tablet   Other Relevant  Orders   Lipid panel   Hemoglobin A1c   Comprehensive metabolic panel   Microalbumin / creatinine urine ratio      Follow-up: Return in about 6 months (around 05/27/2019), or if symptoms worsen or fail to improve, for hypertension, diabetes II.  Beth Held, DO

## 2018-11-26 NOTE — Assessment & Plan Note (Signed)
hgba1c to be checked, minimize simple carbs. Increase exercise as tolerated. Continue current meds  

## 2018-11-28 ENCOUNTER — Other Ambulatory Visit: Payer: Self-pay | Admitting: Family Medicine

## 2018-11-28 DIAGNOSIS — E785 Hyperlipidemia, unspecified: Secondary | ICD-10-CM

## 2018-11-28 DIAGNOSIS — E1165 Type 2 diabetes mellitus with hyperglycemia: Secondary | ICD-10-CM

## 2018-11-28 DIAGNOSIS — E1169 Type 2 diabetes mellitus with other specified complication: Secondary | ICD-10-CM

## 2018-11-28 DIAGNOSIS — I10 Essential (primary) hypertension: Secondary | ICD-10-CM

## 2018-12-04 ENCOUNTER — Other Ambulatory Visit: Payer: Self-pay

## 2018-12-04 MED ORDER — SIMVASTATIN 20 MG PO TABS: 20.0000 mg | ORAL_TABLET | Freq: Every day | ORAL | 2 refills | Status: DC

## 2019-02-28 ENCOUNTER — Encounter: Payer: Self-pay | Admitting: Family Medicine

## 2019-06-07 ENCOUNTER — Other Ambulatory Visit: Payer: Self-pay | Admitting: Family Medicine

## 2019-06-07 DIAGNOSIS — E1165 Type 2 diabetes mellitus with hyperglycemia: Secondary | ICD-10-CM

## 2019-06-07 DIAGNOSIS — I10 Essential (primary) hypertension: Secondary | ICD-10-CM

## 2019-12-13 ENCOUNTER — Other Ambulatory Visit: Payer: Self-pay | Admitting: Family Medicine

## 2019-12-13 DIAGNOSIS — E1165 Type 2 diabetes mellitus with hyperglycemia: Secondary | ICD-10-CM

## 2019-12-13 DIAGNOSIS — I10 Essential (primary) hypertension: Secondary | ICD-10-CM

## 2020-01-11 ENCOUNTER — Other Ambulatory Visit: Payer: Self-pay | Admitting: Family Medicine

## 2020-01-11 DIAGNOSIS — E1165 Type 2 diabetes mellitus with hyperglycemia: Secondary | ICD-10-CM

## 2020-01-11 DIAGNOSIS — I10 Essential (primary) hypertension: Secondary | ICD-10-CM

## 2020-01-22 ENCOUNTER — Other Ambulatory Visit: Payer: Self-pay | Admitting: Family Medicine

## 2020-01-29 ENCOUNTER — Other Ambulatory Visit: Payer: Self-pay | Admitting: Family Medicine

## 2020-01-29 DIAGNOSIS — E1165 Type 2 diabetes mellitus with hyperglycemia: Secondary | ICD-10-CM

## 2020-01-29 DIAGNOSIS — I10 Essential (primary) hypertension: Secondary | ICD-10-CM

## 2020-02-06 ENCOUNTER — Encounter: Payer: Self-pay | Admitting: Family Medicine

## 2020-02-06 ENCOUNTER — Other Ambulatory Visit: Payer: Self-pay

## 2020-02-06 ENCOUNTER — Ambulatory Visit (INDEPENDENT_AMBULATORY_CARE_PROVIDER_SITE_OTHER): Payer: Self-pay | Admitting: Family Medicine

## 2020-02-06 VITALS — BP 118/80 | HR 81 | Temp 98.0°F | Resp 18 | Ht 66.0 in | Wt 234.6 lb

## 2020-02-06 DIAGNOSIS — E1169 Type 2 diabetes mellitus with other specified complication: Secondary | ICD-10-CM

## 2020-02-06 DIAGNOSIS — E1165 Type 2 diabetes mellitus with hyperglycemia: Secondary | ICD-10-CM

## 2020-02-06 DIAGNOSIS — I10 Essential (primary) hypertension: Secondary | ICD-10-CM

## 2020-02-06 DIAGNOSIS — E785 Hyperlipidemia, unspecified: Secondary | ICD-10-CM

## 2020-02-06 LAB — LIPID PANEL
Cholesterol: 140 mg/dL (ref 0–200)
HDL: 42.7 mg/dL (ref >39.00)
LDL Cholesterol: 82 mg/dL (ref 0–99)
NonHDL: 97.17
Total CHOL/HDL Ratio: 3
Triglycerides: 77 mg/dL (ref 0.0–149.0)
VLDL: 15.4 mg/dL (ref 0.0–40.0)

## 2020-02-06 LAB — COMPREHENSIVE METABOLIC PANEL
ALT: 14 U/L (ref 0–35)
AST: 10 U/L (ref 0–37)
Albumin: 3.9 g/dL (ref 3.5–5.2)
Alkaline Phosphatase: 102 U/L (ref 39–117)
BUN: 19 mg/dL (ref 6–23)
CO2: 29 mEq/L (ref 19–32)
Calcium: 9.1 mg/dL (ref 8.4–10.5)
Chloride: 100 mEq/L (ref 96–112)
Creatinine, Ser: 0.84 mg/dL (ref 0.40–1.20)
GFR: 73.19 mL/min (ref >60.00)
Glucose, Bld: 148 mg/dL — ABNORMAL HIGH (ref 70–99)
Potassium: 3.9 mEq/L (ref 3.5–5.1)
Sodium: 134 mEq/L — ABNORMAL LOW (ref 135–145)
Total Bilirubin: 0.9 mg/dL (ref 0.2–1.2)
Total Protein: 7.3 g/dL (ref 6.0–8.3)

## 2020-02-06 LAB — HEMOGLOBIN A1C: Hgb A1c MFr Bld: 7 % — ABNORMAL HIGH (ref 4.6–6.5)

## 2020-02-06 MED ORDER — LISINOPRIL-HYDROCHLOROTHIAZIDE 10-12.5 MG PO TABS: 2.0000 | ORAL_TABLET | Freq: Every day | ORAL | 1 refills | Status: DC

## 2020-02-06 MED ORDER — METFORMIN HCL 500 MG PO TABS: 500.0000 mg | ORAL_TABLET | Freq: Every day | ORAL | 1 refills | Status: DC

## 2020-02-06 MED ORDER — SIMVASTATIN 20 MG PO TABS: 20.0000 mg | ORAL_TABLET | Freq: Every day | ORAL | 2 refills | Status: DC

## 2020-02-06 NOTE — Progress Notes (Signed)
Patient ID: Beth Sanchez, female    DOB: April 29, 1955  Age: 64 y.o. MRN: [id number]    Subjective:  Subjective  HPI Beth Sanchez presents for f/u dm, chol and bp.  HYPERTENSION   Blood pressure range-not checking   Chest pain- no      Dyspnea- no Lightheadedness- no   Edema- no  Other side effects - no   Medication compliance: good Low salt diet- yes     DIABETES    Blood Sugar ranges-not checking   Polyuria- no New Visual problems- no  Hypoglycemic symptoms- no  Other side effects-no Medication compliance - good  Last eye exam- due     HYPERLIPIDEMIA  Medication compliance- good RUQ pain- no  Muscle aches- no Other side effects-no        Review of Systems  Constitutional: Negative for appetite change, diaphoresis, fatigue and unexpected weight change.  Eyes: Negative for pain, redness and visual disturbance.  Respiratory: Negative for cough, chest tightness, shortness of breath and wheezing.   Cardiovascular: Negative for chest pain, palpitations and leg swelling.  Endocrine: Negative for cold intolerance, heat intolerance, polydipsia, polyphagia and polyuria.  Genitourinary: Negative for difficulty urinating, dysuria and frequency.  Neurological: Negative for dizziness, light-headedness, numbness and headaches.    History Past Medical History:  Diagnosis Date  . Hypertension   . Psoriasis     She has no past surgical history on file.   Her family history includes Cancer in an other family member; Colon cancer in an other family member; Diabetes in an other family member; Heart attack (age of onset: 95) in her maternal grandfather; Hypertension in an other family member; Lung cancer in an other family member; Stroke in her mother; Thyroid disease in an other family member.She reports that she has never smoked. She has never used smokeless tobacco. She reports that she does not drink alcohol and does not use drugs.  No current outpatient medications on file  prior to visit.   No current facility-administered medications on file prior to visit.     Objective:  Objective  Physical Exam Vitals and nursing note reviewed.  Constitutional:      Appearance: She is well-developed and well-nourished.  HENT:     Head: Normocephalic and atraumatic.  Eyes:     Extraocular Movements: EOM normal.     Conjunctiva/sclera: Conjunctivae normal.  Neck:     Thyroid: No thyromegaly.     Vascular: No carotid bruit or JVD.  Cardiovascular:     Rate and Rhythm: Normal rate and regular rhythm.     Heart sounds: Normal heart sounds. No murmur heard.   Pulmonary:     Effort: Pulmonary effort is normal. No respiratory distress.     Breath sounds: Normal breath sounds. No wheezing or rales.  Chest:     Chest wall: No tenderness.  Musculoskeletal:        General: No edema.     Cervical back: Normal range of motion and neck supple.  Neurological:     Mental Status: She is alert and oriented to person, place, and time.  Psychiatric:        Mood and Affect: Mood and affect normal.    Diabetic Foot Exam - Simple   Simple Foot Form Diabetic Foot exam was performed with the following findings: Yes 02/06/2020  8:55 AM  Visual Inspection No deformities, no ulcerations, no other skin breakdown bilaterally: Yes Sensation Testing Intact to touch and monofilament testing bilaterally: Yes Pulse Check Posterior Tibialis  and Dorsalis pulse intact bilaterally: Yes Comments     BP 118/80 (BP Location: Left Arm, Patient Position: Sitting, Cuff Size: Large)   Pulse 81   Temp 98 F (36.7 C) (Oral)   Resp 18   Ht 5\' 6"  (1.676 m)   Wt 234 lb 9.6 oz (106.4 kg)   SpO2 98%   BMI 37.87 kg/m  Wt Readings from Last 3 Encounters:  02/06/20 234 lb 9.6 oz (106.4 kg)  11/26/18 234 lb (106.1 kg)  01/16/18 250 lb (113.4 kg)     Lab Results  Component Value Date   WBC 5.1 06/22/2015   HGB 14.7 06/22/2015   HCT 43.5 06/22/2015   PLT 173.0 06/22/2015   GLUCOSE  144 (H) 11/26/2018   CHOL 164 11/26/2018   TRIG 111.0 11/26/2018   HDL 37.00 (L) 11/26/2018   LDLCALC 105 (H) 11/26/2018   ALT 23 11/26/2018   AST 18 11/26/2018   NA 138 11/26/2018   K 4.2 11/26/2018   CL 102 11/26/2018   CREATININE 0.77 11/26/2018   BUN 15 11/26/2018   CO2 28 11/26/2018   TSH 1.90 06/22/2015   HGBA1C 6.7 (H) 11/26/2018   MICROALBUR 1.5 11/26/2018    No results found.   Assessment & Plan:  Plan  I have changed Beth Sanchez's lisinopril-hydrochlorothiazide and metFORMIN. I am also having her maintain her simvastatin.  Meds ordered this encounter  Medications  . lisinopril-hydrochlorothiazide (ZESTORETIC) 10-12.5 MG tablet    Sig: Take 2 tablets by mouth daily.    Dispense:  90 tablet    Refill:  1    DX Code Needed  .  . metFORMIN (GLUCOPHAGE) 500 MG tablet    Sig: Take 1 tablet (500 mg total) by mouth daily.    Dispense:  90 tablet    Refill:  1    DX Code Needed  .  . simvastatin (ZOCOR) 20 MG tablet    Sig: Take 1 tablet (20 mg total) by mouth at bedtime.    Dispense:  30 tablet    Refill:  2    Problem List Items Addressed This Visit      Unprioritized   Essential hypertension - Primary   Relevant Medications   lisinopril-hydrochlorothiazide (ZESTORETIC) 10-12.5 MG tablet   simvastatin (ZOCOR) 20 MG tablet   Other Relevant Orders   Lipid panel   Hemoglobin A1c   Comprehensive metabolic panel   Microalbumin / creatinine urine ratio    Other Visit Diagnoses    Uncontrolled type 2 diabetes mellitus with hyperglycemia (HCC)       Relevant Medications   lisinopril-hydrochlorothiazide (ZESTORETIC) 10-12.5 MG tablet   metFORMIN (GLUCOPHAGE) 500 MG tablet   simvastatin (ZOCOR) 20 MG tablet   Other Relevant Orders   Lipid panel   Hemoglobin A1c   Comprehensive metabolic panel   Microalbumin / creatinine urine ratio   Hyperlipidemia associated with type 2 diabetes mellitus (HCC)       Relevant Medications   lisinopril-hydrochlorothiazide  (ZESTORETIC) 10-12.5 MG tablet   metFORMIN (GLUCOPHAGE) 500 MG tablet   simvastatin (ZOCOR) 20 MG tablet   Other Relevant Orders   Lipid panel   Comprehensive metabolic panel      Follow-up: No follow-ups on file.  Ann Held, DO

## 2020-02-06 NOTE — Patient Instructions (Signed)
Carbohydrate Counting for Diabetes Mellitus, Adult  Carbohydrate counting is a method of keeping track of how many carbohydrates you eat. Eating carbohydrates naturally increases the amount of sugar (glucose) in the blood. Counting how many carbohydrates you eat helps keep your blood glucose within normal limits, which helps you manage your diabetes (diabetes mellitus). It is important to know how many carbohydrates you can safely have in each meal. This is different for every person. A diet and nutrition specialist (registered dietitian) can help you make a meal plan and calculate how many carbohydrates you should have at each meal and snack. Carbohydrates are found in the following foods:  Grains, such as breads and cereals.  Dried beans and soy products.  Starchy vegetables, such as potatoes, peas, and corn.  Fruit and fruit juices.  Milk and yogurt.  Sweets and snack foods, such as cake, cookies, candy, chips, and soft drinks. How do I count carbohydrates? There are two ways to count carbohydrates in food. You can use either of the methods or a combination of both. Reading "Nutrition Facts" on packaged food The "Nutrition Facts" list is included on the labels of almost all packaged foods and beverages in the [place] It includes:  The serving size.  Information about nutrients in each serving, including the grams (g) of carbohydrate per serving. To use the "Nutrition Facts":  Decide how many servings you will have.  Multiply the number of servings by the number of carbohydrates per serving.  The resulting number is the total amount of carbohydrates that you will be having. Learning standard serving sizes of other foods When you eat carbohydrate foods that are not packaged or do not include "Nutrition Facts" on the label, you need to measure the servings in order to count the amount of carbohydrates:  Measure the foods that you will eat with a food scale or measuring cup, if  needed.  Decide how many standard-size servings you will eat.  Multiply the number of servings by 15. Most carbohydrate-rich foods have about 15 g of carbohydrates per serving. ? For example, if you eat 8 oz (170 g) of strawberries, you will have eaten 2 servings and 30 g of carbohydrates (2 servings x 15 g = 30 g).  For foods that have more than one food mixed, such as soups and casseroles, you must count the carbohydrates in each food that is included. The following list contains standard serving sizes of common carbohydrate-rich foods. Each of these servings has about 15 g of carbohydrates:   hamburger bun or  English muffin.   oz (15 mL) syrup.   oz (14 g) jelly.  1 slice of bread.  1 six-inch tortilla.  3 oz (85 g) cooked rice or pasta.  4 oz (113 g) cooked dried beans.  4 oz (113 g) starchy vegetable, such as peas, corn, or potatoes.  4 oz (113 g) hot cereal.  4 oz (113 g) mashed potatoes or  of a large baked potato.  4 oz (113 g) canned or frozen fruit.  4 oz (120 mL) fruit juice.  4-6 crackers.  6 chicken nuggets.  6 oz (170 g) unsweetened dry cereal.  6 oz (170 g) plain fat-free yogurt or yogurt sweetened with artificial sweeteners.  8 oz (240 mL) milk.  8 oz (170 g) fresh fruit or one small piece of fruit.  24 oz (680 g) popped popcorn. Example of carbohydrate counting Sample meal  3 oz (85 g) chicken breast.  6 oz (170 g)   brown rice.  4 oz (113 g) corn.  8 oz (240 mL) milk.  8 oz (170 g) strawberries with sugar-free whipped topping. Carbohydrate calculation 1. Identify the foods that contain carbohydrates: ? Rice. ? Corn. ? Milk. ? Strawberries. 2. Calculate how many servings you have of each food: ? 2 servings rice. ? 1 serving corn. ? 1 serving milk. ? 1 serving strawberries. 3. Multiply each number of servings by 15 g: ? 2 servings rice x 15 g = 30 g. ? 1 serving corn x 15 g = 15 g. ? 1 serving milk x 15 g = 15 g. ? 1  serving strawberries x 15 g = 15 g. 4. Add together all of the amounts to find the total grams of carbohydrates eaten: ? 30 g + 15 g + 15 g + 15 g = 75 g of carbohydrates total. Summary  Carbohydrate counting is a method of keeping track of how many carbohydrates you eat.  Eating carbohydrates naturally increases the amount of sugar (glucose) in the blood.  Counting how many carbohydrates you eat helps keep your blood glucose within normal limits, which helps you manage your diabetes.  A diet and nutrition specialist (registered dietitian) can help you make a meal plan and calculate how many carbohydrates you should have at each meal and snack. This information is not intended to replace advice given to you by your health care provider. Make sure you discuss any questions you have with your health care provider. Document Revised: 09/01/2016 Document Reviewed: 07/22/2015 Elsevier Patient Education  2020 [employer]  

## 2020-02-10 ENCOUNTER — Other Ambulatory Visit: Payer: Self-pay | Admitting: Family Medicine

## 2020-02-10 DIAGNOSIS — E1165 Type 2 diabetes mellitus with hyperglycemia: Secondary | ICD-10-CM

## 2020-02-11 MED ORDER — METFORMIN HCL 500 MG PO TABS
500.0000 mg | ORAL_TABLET | Freq: Two times a day (BID) | ORAL | 2 refills | Status: DC
Start: 2020-02-11 — End: 2020-05-07

## 2020-02-11 NOTE — Addendum Note (Signed)
Addended by: Wynonia Musty A on: 02/11/2020 10:49 AM   Modules accepted: Orders

## 2020-03-25 DIAGNOSIS — E119 Type 2 diabetes mellitus without complications: Secondary | ICD-10-CM | POA: Diagnosis not present

## 2020-03-25 LAB — HM DIABETES EYE EXAM

## 2020-04-28 ENCOUNTER — Other Ambulatory Visit: Payer: Self-pay | Admitting: Family Medicine

## 2020-04-28 DIAGNOSIS — E1165 Type 2 diabetes mellitus with hyperglycemia: Secondary | ICD-10-CM

## 2020-04-28 DIAGNOSIS — I10 Essential (primary) hypertension: Secondary | ICD-10-CM

## 2020-05-06 ENCOUNTER — Other Ambulatory Visit: Payer: Self-pay | Admitting: Family Medicine

## 2020-05-08 ENCOUNTER — Other Ambulatory Visit: Payer: Self-pay | Admitting: Family Medicine

## 2020-05-08 DIAGNOSIS — I10 Essential (primary) hypertension: Secondary | ICD-10-CM

## 2020-05-22 ENCOUNTER — Other Ambulatory Visit: Payer: Self-pay | Admitting: Family Medicine

## 2020-05-22 DIAGNOSIS — I10 Essential (primary) hypertension: Secondary | ICD-10-CM

## 2020-05-22 DIAGNOSIS — E1165 Type 2 diabetes mellitus with hyperglycemia: Secondary | ICD-10-CM

## 2020-06-03 ENCOUNTER — Other Ambulatory Visit: Payer: Self-pay | Admitting: Family Medicine

## 2020-06-03 DIAGNOSIS — I10 Essential (primary) hypertension: Secondary | ICD-10-CM

## 2020-07-05 ENCOUNTER — Other Ambulatory Visit: Payer: Self-pay | Admitting: Family Medicine

## 2020-07-05 DIAGNOSIS — I10 Essential (primary) hypertension: Secondary | ICD-10-CM

## 2020-07-30 ENCOUNTER — Other Ambulatory Visit: Payer: Self-pay | Admitting: Family Medicine

## 2020-07-30 DIAGNOSIS — E1165 Type 2 diabetes mellitus with hyperglycemia: Secondary | ICD-10-CM

## 2020-07-30 DIAGNOSIS — I10 Essential (primary) hypertension: Secondary | ICD-10-CM

## 2020-08-22 ENCOUNTER — Other Ambulatory Visit: Payer: Self-pay | Admitting: Family Medicine

## 2020-08-22 DIAGNOSIS — I10 Essential (primary) hypertension: Secondary | ICD-10-CM

## 2020-08-22 DIAGNOSIS — E1165 Type 2 diabetes mellitus with hyperglycemia: Secondary | ICD-10-CM

## 2020-08-27 ENCOUNTER — Other Ambulatory Visit: Payer: Self-pay

## 2020-08-27 DIAGNOSIS — I10 Essential (primary) hypertension: Secondary | ICD-10-CM

## 2020-08-27 MED ORDER — LISINOPRIL-HYDROCHLOROTHIAZIDE 10-12.5 MG PO TABS: 2.0000 | ORAL_TABLET | Freq: Every day | ORAL | 0 refills | Status: DC

## 2020-09-10 ENCOUNTER — Telehealth: Payer: Self-pay | Admitting: Family Medicine

## 2020-09-10 ENCOUNTER — Encounter: Payer: Self-pay | Admitting: Family Medicine

## 2020-09-10 ENCOUNTER — Ambulatory Visit (INDEPENDENT_AMBULATORY_CARE_PROVIDER_SITE_OTHER): Payer: Medicare Other | Admitting: Family Medicine

## 2020-09-10 ENCOUNTER — Other Ambulatory Visit: Payer: Self-pay

## 2020-09-10 VITALS — BP 81/59 | HR 103 | Temp 98.9°F | Resp 18 | Ht 66.0 in | Wt 201.0 lb

## 2020-09-10 DIAGNOSIS — I10 Essential (primary) hypertension: Secondary | ICD-10-CM | POA: Diagnosis not present

## 2020-09-10 DIAGNOSIS — E785 Hyperlipidemia, unspecified: Secondary | ICD-10-CM

## 2020-09-10 DIAGNOSIS — E1165 Type 2 diabetes mellitus with hyperglycemia: Secondary | ICD-10-CM

## 2020-09-10 DIAGNOSIS — E1169 Type 2 diabetes mellitus with other specified complication: Secondary | ICD-10-CM | POA: Diagnosis not present

## 2020-09-10 DIAGNOSIS — R1013 Epigastric pain: Secondary | ICD-10-CM

## 2020-09-10 DIAGNOSIS — R0602 Shortness of breath: Secondary | ICD-10-CM | POA: Insufficient documentation

## 2020-09-10 HISTORY — DX: Type 2 diabetes mellitus with hyperglycemia: E11.65

## 2020-09-10 LAB — CBC WITH DIFFERENTIAL/PLATELET
Basophils Absolute: 0 10*3/uL (ref 0.0–0.1)
Basophils Relative: 0.6 % (ref 0.0–3.0)
Eosinophils Absolute: 0.1 10*3/uL (ref 0.0–0.7)
Eosinophils Relative: 1.4 % (ref 0.0–5.0)
HCT: 32.4 % — ABNORMAL LOW (ref 36.0–46.0)
Hemoglobin: 10.7 g/dL — ABNORMAL LOW (ref 12.0–15.0)
Lymphocytes Relative: 7.7 % — ABNORMAL LOW (ref 12.0–46.0)
Lymphs Abs: 0.5 10*3/uL — ABNORMAL LOW (ref 0.7–4.0)
MCHC: 33 g/dL (ref 30.0–36.0)
MCV: 80.4 fl (ref 78.0–100.0)
Monocytes Absolute: 0.5 10*3/uL (ref 0.1–1.0)
Monocytes Relative: 7.9 % (ref 3.0–12.0)
Neutro Abs: 5 10*3/uL (ref 1.4–7.7)
Neutrophils Relative %: 82.4 % — ABNORMAL HIGH (ref 43.0–77.0)
Platelets: 308 10*3/uL (ref 150.0–400.0)
RBC: 4.03 Mil/uL (ref 3.87–5.11)
RDW: 16.3 % — ABNORMAL HIGH (ref 11.5–15.5)
WBC: 6 10*3/uL (ref 4.0–10.5)

## 2020-09-10 LAB — LIPID PANEL
Cholesterol: 129 mg/dL (ref 0–200)
HDL: 35.2 mg/dL — ABNORMAL LOW (ref >39.00)
LDL Cholesterol: 79 mg/dL (ref 0–99)
NonHDL: 94.01
Total CHOL/HDL Ratio: 4
Triglycerides: 74 mg/dL (ref 0.0–149.0)
VLDL: 14.8 mg/dL (ref 0.0–40.0)

## 2020-09-10 LAB — COMPREHENSIVE METABOLIC PANEL
ALT: 12 U/L (ref 0–35)
AST: 22 U/L (ref 0–37)
Albumin: 3.4 g/dL — ABNORMAL LOW (ref 3.5–5.2)
Alkaline Phosphatase: 131 U/L — ABNORMAL HIGH (ref 39–117)
BUN: 38 mg/dL — ABNORMAL HIGH (ref 6–23)
CO2: 22 mEq/L (ref 19–32)
Calcium: 9.1 mg/dL (ref 8.4–10.5)
Chloride: 99 mEq/L (ref 96–112)
Creatinine, Ser: 1.32 mg/dL — ABNORMAL HIGH (ref 0.40–1.20)
GFR: 42.37 mL/min — ABNORMAL LOW (ref >60.00)
Glucose, Bld: 96 mg/dL (ref 70–99)
Potassium: 4.9 mEq/L (ref 3.5–5.1)
Sodium: 131 mEq/L — ABNORMAL LOW (ref 135–145)
Total Bilirubin: 0.9 mg/dL (ref 0.2–1.2)
Total Protein: 6.7 g/dL (ref 6.0–8.3)

## 2020-09-10 LAB — TSH: TSH: 3.18 u[IU]/mL (ref 0.35–5.50)

## 2020-09-10 LAB — HEMOGLOBIN A1C: Hgb A1c MFr Bld: 6.3 % (ref 4.6–6.5)

## 2020-09-10 MED ORDER — LISINOPRIL-HYDROCHLOROTHIAZIDE 10-12.5 MG PO TABS: 1.0000 | ORAL_TABLET | Freq: Every day | ORAL | 1 refills | Status: DC

## 2020-09-10 MED ORDER — OMEPRAZOLE 20 MG PO CPDR: 20.0000 mg | DELAYED_RELEASE_CAPSULE | Freq: Every day | ORAL | 1 refills | Status: AC

## 2020-09-10 MED ORDER — METFORMIN HCL 500 MG PO TABS: 500.0000 mg | ORAL_TABLET | Freq: Every day | ORAL | 1 refills | Status: AC

## 2020-09-10 NOTE — Assessment & Plan Note (Addendum)
Tolerating statin, encouraged heart healthy diet, avoid trans fats, minimize simple carbs and saturated fats. Increase exercise as tolerated 

## 2020-09-10 NOTE — Telephone Encounter (Signed)
When checking out patient today, AVS stated Beth Sanchez wanted a CPE in 6 months.  Patient refused appt for CPE therefore no appointment was made.  Patient was not rude about it by any means! Just an [place]

## 2020-09-10 NOTE — Progress Notes (Addendum)
Subjective:   By signing my name below, I, Shehryar Baig, attest that this documentation has been prepared under the direction and in the presence of Dr. Roma Schanz, DO. 09/10/2020   Patient ID: Beth Sanchez, female    DOB: Oct 22, 1955, 65 y.o.   MRN: [id number]  Chief Complaint  Patient presents with   Follow-up    Concerns/ questions: She would like to discuss alone with Dr Etter Sjogren   Hyperlipidemia    Crestor 20 mg   Diabetes    Metformin 500 BID   Hypertension    Lisinopril HCTZ 10-25 mg BID    HPI Patient is in today for an office visit. She complains losing her appetite due to digestion issues and stress and since then lost weight. After eating she feels a burning sensation in her mid-stomach. She has tried taking 20 mg omeprazole daily PO yesterday and found great relief.   Wt Readings from Last 3 Encounters:  09/10/20 201 lb (91.2 kg)  02/06/20 234 lb 9.6 oz (106.4 kg)  11/26/18 234 lb (106.1 kg)   Since losing her appetite, she started taking only 500 mg metformin daily instead of 2x daily and has completely stopped taking 20 mg simvastatin daily PO. She report having no new issues after she stopped taking simvastatin and reducing her metformin. She denies having any chest pain at this time. She mentions having shortness of breath when having episodes burning in her mid-stomach. She is interested in talking to a gastrointestinal specialist. She reports having extra stress in her life due to deaths and other events in her family.  She has 4 Covid-19 vaccines at this time and has brought her Covid-19 vaccination card during this visit.  Past Medical History:  Diagnosis Date   Hypertension    Psoriasis     History reviewed. No pertinent surgical history.  Family History  Problem Relation Age of Onset   Stroke Mother    Heart disease Sister    Heart attack Maternal Grandfather 71   Diabetes Other    Colon cancer Other    Lung cancer Other    Cancer Other         Bladder cancer   Hypertension Other    Thyroid disease Other        Hypothyroid    Social History   Socioeconomic History   Marital status: Widowed    Spouse name: Not on file   Number of children: Not on file   Years of education: Not on file   Highest education level: Not on file  Occupational History   Not on file  Tobacco Use   Smoking status: Never   Smokeless tobacco: Never  Substance and Sexual Activity   Alcohol use: No   Drug use: No   Sexual activity: Not on file  Other Topics Concern   Not on file  Social History Narrative   Not on file   Social Determinants of Health   Financial Resource Strain: Not on file  Food Insecurity: Not on file  Transportation Needs: Not on file  Physical Activity: Not on file  Stress: Not on file  Social Connections: Not on file  Intimate Partner Violence: Not on file    Outpatient Medications Prior to Visit  Medication Sig Dispense Refill   lisinopril-hydrochlorothiazide (ZESTORETIC) 10-12.5 MG tablet Take 2 tablets by mouth daily. 60 tablet 0   metFORMIN (GLUCOPHAGE) 500 MG tablet Take 1 tablet (500 mg total) by mouth 2 (two) times daily with a  meal. 180 tablet 0   simvastatin (ZOCOR) 20 MG tablet TAKE 1 TABLET BY MOUTH EVERYDAY AT BEDTIME 90 tablet 0   No facility-administered medications prior to visit.    No Known Allergies  Review of Systems  Constitutional:        (+)loss of appetite   Respiratory:  Positive for shortness of breath (only with heartburn).   Gastrointestinal:  Positive for heartburn.      Objective:    Physical Exam Constitutional:      General: She is not in acute distress.    Appearance: Normal appearance. She is not ill-appearing.  HENT:     Head: Normocephalic and atraumatic.     Right Ear: External ear normal.     Left Ear: External ear normal.  Eyes:     Extraocular Movements: Extraocular movements intact.     Pupils: Pupils are equal, round, and reactive to light.   Cardiovascular:     Rate and Rhythm: Normal rate and regular rhythm.     Pulses: Normal pulses.     Heart sounds: Normal heart sounds. No murmur heard. Pulmonary:     Effort: Pulmonary effort is normal. No respiratory distress.     Breath sounds: Normal breath sounds. No wheezing or rales.  Abdominal:     General: There is no distension.     Palpations: Abdomen is soft.     Tenderness: There is no abdominal tenderness. There is no guarding or rebound.  Skin:    General: Skin is warm and dry.  Neurological:     Mental Status: She is alert and oriented to person, place, and time.  Psychiatric:        Behavior: Behavior normal.        Judgment: Judgment normal.    BP (!) 81/59 (BP Location: Left Arm, Patient Position: Sitting, Cuff Size: Large)   Pulse (!) 103   Temp 98.9 F (37.2 C) (Oral)   Resp 18   Ht 5\' 6"  (1.676 m)   Wt 201 lb (91.2 kg)   SpO2 96%   BMI 32.44 kg/m  Wt Readings from Last 3 Encounters:  09/10/20 201 lb (91.2 kg)  02/06/20 234 lb 9.6 oz (106.4 kg)  11/26/18 234 lb (106.1 kg)    Diabetic Foot Exam - Simple   No data filed    Lab Results  Component Value Date   WBC 6.0 09/10/2020   HGB 10.7 (L) 09/10/2020   HCT 32.4 (L) 09/10/2020   PLT 308.0 09/10/2020   GLUCOSE 96 09/10/2020   CHOL 129 09/10/2020   TRIG 74.0 09/10/2020   HDL 35.20 (L) 09/10/2020   LDLCALC 79 09/10/2020   ALT 12 09/10/2020   AST 22 09/10/2020   NA 131 (L) 09/10/2020   K 4.9 09/10/2020   CL 99 09/10/2020   CREATININE 1.32 (H) 09/10/2020   BUN 38 (H) 09/10/2020   CO2 22 09/10/2020   TSH 3.18 09/10/2020   HGBA1C 6.3 09/10/2020   MICROALBUR 1.5 11/26/2018    Lab Results  Component Value Date   TSH 3.18 09/10/2020   Lab Results  Component Value Date   WBC 6.0 09/10/2020   HGB 10.7 (L) 09/10/2020   HCT 32.4 (L) 09/10/2020   MCV 80.4 09/10/2020   PLT 308.0 09/10/2020   Lab Results  Component Value Date   NA 131 (L) 09/10/2020   K 4.9 09/10/2020   CO2 22  09/10/2020   GLUCOSE 96 09/10/2020   BUN 38 (H) 09/10/2020  CREATININE 1.32 (H) 09/10/2020   BILITOT 0.9 09/10/2020   ALKPHOS 131 (H) 09/10/2020   AST 22 09/10/2020   ALT 12 09/10/2020   PROT 6.7 09/10/2020   ALBUMIN 3.4 (L) 09/10/2020   CALCIUM 9.1 09/10/2020   GFR 42.37 (L) 09/10/2020   Lab Results  Component Value Date   CHOL 129 09/10/2020   Lab Results  Component Value Date   HDL 35.20 (L) 09/10/2020   Lab Results  Component Value Date   LDLCALC 79 09/10/2020   Lab Results  Component Value Date   TRIG 74.0 09/10/2020   Lab Results  Component Value Date   CHOLHDL 4 09/10/2020   Lab Results  Component Value Date   HGBA1C 6.3 09/10/2020       Assessment & Plan:   Problem List Items Addressed This Visit       Unprioritized   Dyspepsia - Primary    Omeprazole 20 mg qd  Refer to gi  Check labs        Relevant Medications   omeprazole (PRILOSEC) 20 MG capsule   Other Relevant Orders   Lipid panel (Completed)   TSH (Completed)   CBC with Differential/Platelet (Completed)   Comprehensive metabolic panel (Completed)   Ambulatory referral to Gastroenterology   Essential hypertension    Running low Dec lisinopril and f/u 2-3 weeks        Relevant Medications   lisinopril-hydrochlorothiazide (ZESTORETIC) 10-12.5 MG tablet   Other Relevant Orders   Lipid panel (Completed)   TSH (Completed)   CBC with Differential/Platelet (Completed)   Comprehensive metabolic panel (Completed)   Hyperlipidemia associated with type 2 diabetes mellitus ([place])    Tolerating statin, encouraged heart healthy diet, avoid trans fats, minimize simple carbs and saturated fats. Increase exercise as tolerated       Relevant Medications   metFORMIN (GLUCOPHAGE) 500 MG tablet   lisinopril-hydrochlorothiazide (ZESTORETIC) 10-12.5 MG tablet   Type 2 diabetes mellitus with hyperglycemia (H[facility])   Relevant Medications   metFORMIN (GLUCOPHAGE) 500 MG tablet    lisinopril-hydrochlorothiazide (ZESTORETIC) 10-12.5 MG tablet   Other Relevant Orders   Lipid panel (Completed)   TSH (Completed)   CBC with Differential/Platelet (Completed)   Comprehensive metabolic panel (Completed)   Hemoglobin A1c (Completed)      Meds ordered this encounter  Medications   metFORMIN (GLUCOPHAGE) 500 MG tablet    Sig: Take 1 tablet (500 mg total) by mouth daily with breakfast.    Dispense:  90 tablet    Refill:  1    DX Code Needed  .   lisinopril-hydrochlorothiazide (ZESTORETIC) 10-12.5 MG tablet    Sig: Take 1 tablet by mouth daily.    Dispense:  90 tablet    Refill:  1   omeprazole (PRILOSEC) 20 MG capsule    Sig: Take 1 capsule (20 mg total) by mouth daily.    Dispense:  90 capsule    Refill:  1    I, Dr. Roma Schanz, DO., personally preformed the services described in this documentation.  All medical record entries made by the scribe were at my direction and in my presence.  I have reviewed the chart and discharge instructions (if applicable) and agree that the record reflects my personal performance and is accurate and complete. 09/10/2020   I,Shehryar Baig,acting as a Education administrator for [employer], DO.,have documented all relevant documentation on the behalf of Ann Held, DO,as directed by  Ann Held, DO while in the  presence of Ann Held, DO.    Ann Held, DO

## 2020-09-10 NOTE — Assessment & Plan Note (Signed)
Omeprazole 20 mg qd  Refer to gi  Check labs

## 2020-09-10 NOTE — Assessment & Plan Note (Signed)
Running low Dec lisinopril and f/u 2-3 weeks

## 2020-09-10 NOTE — Patient Instructions (Signed)
Food Choices for Gastroesophageal Reflux Disease, Adult °When you have gastroesophageal reflux disease (GERD), the foods you eat and your eating habits are very important. Choosing the right foods can help ease the discomfort of GERD. Consider working with a dietitian to help you make healthy food choices. °What are tips for following this plan? °Reading food labels °Look for foods that are low in saturated fat. Foods that have less than 5% of daily value (DV) of fat and 0 g of trans fats may help with your symptoms. °Cooking °Cook foods using methods other than frying. This may include baking, steaming, grilling, or broiling. These are all methods that do not need a lot of fat for cooking. °To add flavor, try to use herbs that are low in spice and acidity. °Meal planning ° °Choose healthy foods that are low in fat, such as fruits, vegetables, whole grains, low-fat dairy products, lean meats, fish, and poultry. °Eat frequent, small meals instead of three large meals each day. Eat your meals slowly, in a relaxed setting. Avoid bending over or lying down until 2-3 hours after eating. °Limit high-fat foods such as fatty meats or fried foods. °Limit your intake of fatty foods, such as oils, butter, and shortening. °Avoid the following as told by your health care provider: °Foods that cause symptoms. These may be different for different people. Keep a food diary to keep track of foods that cause symptoms. °Alcohol. °Drinking large amounts of liquid with meals. °Eating meals during the 2-3 hours before bed. °Lifestyle °Maintain a healthy weight. Ask your health care provider what weight is healthy for you. If you need to lose weight, work with your health care provider to do so safely. °Exercise for at least 30 minutes on 5 or more days each week, or as told by your health care provider. °Avoid wearing clothes that fit tightly around your waist and chest. °Do not use any products that contain nicotine or tobacco. These  products include cigarettes, chewing tobacco, and vaping devices, such as e-cigarettes. If you need help quitting, ask your health care provider. °Sleep with the head of your bed raised. Use a wedge under the mattress or blocks under the bed frame to raise the head of the bed. °Chew sugar-free gum after mealtimes. °What foods should I eat? °Eat a healthy, well-balanced diet of fruits, vegetables, whole grains, low-fat dairy products, lean meats, fish, and poultry. Each person is different. Foods that may trigger symptoms in one person may not trigger any symptoms in another person. Work with your health care provider to identify foods that are safe for you. °The items listed above may not be a complete list of recommended foods and beverages. Contact a dietitian for more information. °What foods should I avoid? °Limiting some of these foods may help manage the symptoms of GERD. Everyone is different. Consult a dietitian or your health care provider to help you identify the exact foods to avoid, if any. °Fruits °Any fruits prepared with added fat. Any fruits that cause symptoms. For some people this may include citrus fruits, such as oranges, grapefruit, pineapple, and lemons. °Vegetables °Deep-fried vegetables. French fries. Any vegetables prepared with added fat. Any vegetables that cause symptoms. For some people, this may include tomatoes and tomato products, chili peppers, onions and garlic, and horseradish. °Grains °Pastries or quick breads with added fat. °Meats and other proteins °High-fat meats, such as fatty beef or pork, hot dogs, ribs, ham, sausage, salami, and bacon. Fried meat or protein, including fried   fish and fried chicken. Nuts and nut butters, in large amounts. °Dairy °Whole milk and chocolate milk. Sour cream. Cream. Ice cream. Cream cheese. Milkshakes. °Fats and oils °Butter. Margarine. Shortening. Ghee. °Beverages °Coffee and tea, with or without caffeine. Carbonated beverages. Sodas. Energy  drinks. Fruit juice made with acidic fruits, such as orange or grapefruit. Tomato juice. Alcoholic drinks. °Sweets and desserts °Chocolate and cocoa. Donuts. °Seasonings and condiments °Pepper. Peppermint and spearmint. Added salt. Any condiments, herbs, or seasonings that cause symptoms. For some people, this may include curry, hot sauce, or vinegar-based salad dressings. °The items listed above may not be a complete list of foods and beverages to avoid. Contact a dietitian for more information. °Questions to ask your health care provider °Diet and lifestyle changes are usually the first steps that are taken to manage symptoms of GERD. If diet and lifestyle changes do not improve your symptoms, talk with your health care provider about taking medicines. °Where to find more information °[employer]: aboutgerd.org °Summary °When you have gastroesophageal reflux disease (GERD), food and lifestyle choices may be very helpful in easing the discomfort of GERD. °Eat frequent, small meals instead of three large meals each day. Eat your meals slowly, in a relaxed setting. Avoid bending over or lying down until 2-3 hours after eating. °Limit high-fat foods such as fatty meats or fried foods. °This information is not intended to replace advice given to you by your health care provider. Make sure you discuss any questions you have with your health care provider. °Document Revised: 08/19/2019 Document Reviewed: 08/19/2019 °Elsevier Patient Education © 2022 [employer] ° °

## 2020-09-14 ENCOUNTER — Other Ambulatory Visit: Payer: Self-pay | Admitting: Family Medicine

## 2020-09-14 DIAGNOSIS — E785 Hyperlipidemia, unspecified: Secondary | ICD-10-CM

## 2020-09-14 DIAGNOSIS — E1165 Type 2 diabetes mellitus with hyperglycemia: Secondary | ICD-10-CM

## 2020-09-15 ENCOUNTER — Other Ambulatory Visit: Payer: Self-pay

## 2020-09-15 MED ORDER — EMPAGLIFLOZIN 10 MG PO TABS: 10.0000 mg | ORAL_TABLET | Freq: Every day | ORAL | 2 refills | Status: AC

## 2020-09-16 ENCOUNTER — Encounter: Payer: Self-pay | Admitting: Gastroenterology

## 2020-09-16 ENCOUNTER — Telehealth: Payer: Self-pay | Admitting: Family Medicine

## 2020-09-16 NOTE — Telephone Encounter (Signed)
Pt called. LVM to return call 

## 2020-09-16 NOTE — Telephone Encounter (Signed)
Patient would like to speak to Dr. Etter Sjogren or her assistant. Patient refuse to give me any further information. "It is personal information, they have my chart"

## 2020-09-17 NOTE — Telephone Encounter (Signed)
Patient sister called stating patient is abdominal pain and is wondering if she could prescribe pain medication

## 2020-09-18 NOTE — Telephone Encounter (Signed)
PT called in on  04/28. Gave info below. However pt stated that she felt like [facility] didn't have time to see her. She will not go to ER she will just handle things herself.

## 2020-09-19 ENCOUNTER — Other Ambulatory Visit: Payer: Self-pay | Admitting: Family Medicine

## 2020-09-19 DIAGNOSIS — I10 Essential (primary) hypertension: Secondary | ICD-10-CM

## 2020-10-05 ENCOUNTER — Encounter (HOSPITAL_COMMUNITY): Payer: Self-pay

## 2020-10-05 ENCOUNTER — Inpatient Hospital Stay (HOSPITAL_COMMUNITY)
Admission: EM | Admit: 2020-10-05 | Discharge: 2020-11-09 | DRG: 356 | Disposition: A | Discharge status: Skilled Nursing Facility | Payer: Medicare Other | Attending: Internal Medicine | Admitting: Internal Medicine

## 2020-10-05 ENCOUNTER — Other Ambulatory Visit: Payer: Self-pay | Admitting: Radiology

## 2020-10-05 ENCOUNTER — Emergency Department (HOSPITAL_COMMUNITY): Payer: Medicare Other

## 2020-10-05 ENCOUNTER — Inpatient Hospital Stay (HOSPITAL_COMMUNITY): Payer: Medicare Other

## 2020-10-05 DIAGNOSIS — E669 Obesity, unspecified: Secondary | ICD-10-CM | POA: Diagnosis present

## 2020-10-05 DIAGNOSIS — R579 Shock, unspecified: Secondary | ICD-10-CM | POA: Diagnosis not present

## 2020-10-05 DIAGNOSIS — K2970 Gastritis, unspecified, without bleeding: Secondary | ICD-10-CM | POA: Diagnosis present

## 2020-10-05 DIAGNOSIS — E1165 Type 2 diabetes mellitus with hyperglycemia: Secondary | ICD-10-CM | POA: Diagnosis present

## 2020-10-05 DIAGNOSIS — Z7189 Other specified counseling: Secondary | ICD-10-CM | POA: Clinically undetermined

## 2020-10-05 DIAGNOSIS — E785 Hyperlipidemia, unspecified: Secondary | ICD-10-CM | POA: Diagnosis present

## 2020-10-05 DIAGNOSIS — Z8616 Personal history of COVID-19: Secondary | ICD-10-CM | POA: Diagnosis not present

## 2020-10-05 DIAGNOSIS — R112 Nausea with vomiting, unspecified: Secondary | ICD-10-CM | POA: Diagnosis not present

## 2020-10-05 DIAGNOSIS — R0689 Other abnormalities of breathing: Secondary | ICD-10-CM | POA: Diagnosis not present

## 2020-10-05 DIAGNOSIS — R1084 Generalized abdominal pain: Secondary | ICD-10-CM | POA: Diagnosis not present

## 2020-10-05 DIAGNOSIS — M7989 Other specified soft tissue disorders: Secondary | ICD-10-CM | POA: Diagnosis not present

## 2020-10-05 DIAGNOSIS — R571 Hypovolemic shock: Secondary | ICD-10-CM | POA: Diagnosis not present

## 2020-10-05 DIAGNOSIS — I82442 Acute embolism and thrombosis of left tibial vein: Secondary | ICD-10-CM | POA: Diagnosis not present

## 2020-10-05 DIAGNOSIS — R1011 Right upper quadrant pain: Secondary | ICD-10-CM

## 2020-10-05 DIAGNOSIS — D509 Iron deficiency anemia, unspecified: Secondary | ICD-10-CM | POA: Diagnosis not present

## 2020-10-05 DIAGNOSIS — Z6827 Body mass index (BMI) 27.0-27.9, adult: Secondary | ICD-10-CM

## 2020-10-05 DIAGNOSIS — I959 Hypotension, unspecified: Secondary | ICD-10-CM | POA: Diagnosis not present

## 2020-10-05 DIAGNOSIS — E875 Hyperkalemia: Secondary | ICD-10-CM | POA: Diagnosis not present

## 2020-10-05 DIAGNOSIS — Z23 Encounter for immunization: Secondary | ICD-10-CM

## 2020-10-05 DIAGNOSIS — C786 Secondary malignant neoplasm of retroperitoneum and peritoneum: Secondary | ICD-10-CM | POA: Diagnosis not present

## 2020-10-05 DIAGNOSIS — R180 Malignant ascites: Secondary | ICD-10-CM | POA: Diagnosis present

## 2020-10-05 DIAGNOSIS — I82432 Acute embolism and thrombosis of left popliteal vein: Secondary | ICD-10-CM | POA: Diagnosis not present

## 2020-10-05 DIAGNOSIS — C569 Malignant neoplasm of unspecified ovary: Secondary | ICD-10-CM

## 2020-10-05 DIAGNOSIS — R7989 Other specified abnormal findings of blood chemistry: Secondary | ICD-10-CM | POA: Diagnosis present

## 2020-10-05 DIAGNOSIS — M549 Dorsalgia, unspecified: Secondary | ICD-10-CM | POA: Diagnosis not present

## 2020-10-05 DIAGNOSIS — D61810 Antineoplastic chemotherapy induced pancytopenia: Secondary | ICD-10-CM | POA: Diagnosis not present

## 2020-10-05 DIAGNOSIS — K567 Ileus, unspecified: Secondary | ICD-10-CM | POA: Diagnosis not present

## 2020-10-05 DIAGNOSIS — C561 Malignant neoplasm of right ovary: Secondary | ICD-10-CM | POA: Diagnosis present

## 2020-10-05 DIAGNOSIS — Z7984 Long term (current) use of oral hypoglycemic drugs: Secondary | ICD-10-CM

## 2020-10-05 DIAGNOSIS — R6521 Severe sepsis with septic shock: Secondary | ICD-10-CM | POA: Diagnosis not present

## 2020-10-05 DIAGNOSIS — E8770 Fluid overload, unspecified: Secondary | ICD-10-CM | POA: Diagnosis present

## 2020-10-05 DIAGNOSIS — E11649 Type 2 diabetes mellitus with hypoglycemia without coma: Secondary | ICD-10-CM | POA: Diagnosis not present

## 2020-10-05 DIAGNOSIS — K8000 Calculus of gallbladder with acute cholecystitis without obstruction: Secondary | ICD-10-CM | POA: Diagnosis not present

## 2020-10-05 DIAGNOSIS — I9589 Other hypotension: Secondary | ICD-10-CM | POA: Diagnosis not present

## 2020-10-05 DIAGNOSIS — U071 COVID-19: Secondary | ICD-10-CM | POA: Diagnosis present

## 2020-10-05 DIAGNOSIS — E872 Acidosis: Secondary | ICD-10-CM | POA: Diagnosis not present

## 2020-10-05 DIAGNOSIS — A419 Sepsis, unspecified organism: Secondary | ICD-10-CM | POA: Diagnosis not present

## 2020-10-05 DIAGNOSIS — N1330 Unspecified hydronephrosis: Secondary | ICD-10-CM | POA: Diagnosis not present

## 2020-10-05 DIAGNOSIS — R197 Diarrhea, unspecified: Secondary | ICD-10-CM | POA: Diagnosis not present

## 2020-10-05 DIAGNOSIS — R109 Unspecified abdominal pain: Secondary | ICD-10-CM | POA: Diagnosis not present

## 2020-10-05 DIAGNOSIS — C482 Malignant neoplasm of peritoneum, unspecified: Secondary | ICD-10-CM | POA: Diagnosis present

## 2020-10-05 DIAGNOSIS — Z66 Do not resuscitate: Secondary | ICD-10-CM | POA: Diagnosis present

## 2020-10-05 DIAGNOSIS — I82452 Acute embolism and thrombosis of left peroneal vein: Secondary | ICD-10-CM | POA: Diagnosis not present

## 2020-10-05 DIAGNOSIS — E1169 Type 2 diabetes mellitus with other specified complication: Secondary | ICD-10-CM | POA: Diagnosis present

## 2020-10-05 DIAGNOSIS — R609 Edema, unspecified: Secondary | ICD-10-CM | POA: Diagnosis not present

## 2020-10-05 DIAGNOSIS — E440 Moderate protein-calorie malnutrition: Secondary | ICD-10-CM | POA: Diagnosis present

## 2020-10-05 DIAGNOSIS — G893 Neoplasm related pain (acute) (chronic): Secondary | ICD-10-CM | POA: Diagnosis present

## 2020-10-05 DIAGNOSIS — I452 Bifascicular block: Secondary | ICD-10-CM | POA: Diagnosis present

## 2020-10-05 DIAGNOSIS — D649 Anemia, unspecified: Secondary | ICD-10-CM | POA: Diagnosis not present

## 2020-10-05 DIAGNOSIS — N179 Acute kidney failure, unspecified: Secondary | ICD-10-CM | POA: Diagnosis not present

## 2020-10-05 DIAGNOSIS — N858 Other specified noninflammatory disorders of uterus: Secondary | ICD-10-CM | POA: Diagnosis not present

## 2020-10-05 DIAGNOSIS — Z79899 Other long term (current) drug therapy: Secondary | ICD-10-CM

## 2020-10-05 DIAGNOSIS — R110 Nausea: Secondary | ICD-10-CM | POA: Diagnosis not present

## 2020-10-05 DIAGNOSIS — K449 Diaphragmatic hernia without obstruction or gangrene: Secondary | ICD-10-CM | POA: Diagnosis not present

## 2020-10-05 DIAGNOSIS — J440 Chronic obstructive pulmonary disease with acute lower respiratory infection: Secondary | ICD-10-CM | POA: Diagnosis not present

## 2020-10-05 DIAGNOSIS — R918 Other nonspecific abnormal finding of lung field: Secondary | ICD-10-CM | POA: Diagnosis not present

## 2020-10-05 DIAGNOSIS — K2100 Gastro-esophageal reflux disease with esophagitis, without bleeding: Secondary | ICD-10-CM | POA: Diagnosis present

## 2020-10-05 DIAGNOSIS — I10 Essential (primary) hypertension: Secondary | ICD-10-CM | POA: Diagnosis present

## 2020-10-05 DIAGNOSIS — K7460 Unspecified cirrhosis of liver: Secondary | ICD-10-CM | POA: Diagnosis not present

## 2020-10-05 DIAGNOSIS — R140 Abdominal distension (gaseous): Secondary | ICD-10-CM | POA: Diagnosis not present

## 2020-10-05 DIAGNOSIS — C801 Malignant (primary) neoplasm, unspecified: Secondary | ICD-10-CM | POA: Diagnosis not present

## 2020-10-05 DIAGNOSIS — R1110 Vomiting, unspecified: Secondary | ICD-10-CM | POA: Diagnosis not present

## 2020-10-05 DIAGNOSIS — J9601 Acute respiratory failure with hypoxia: Secondary | ICD-10-CM | POA: Diagnosis not present

## 2020-10-05 DIAGNOSIS — N19 Unspecified kidney failure: Secondary | ICD-10-CM

## 2020-10-05 DIAGNOSIS — E871 Hypo-osmolality and hyponatremia: Secondary | ICD-10-CM | POA: Diagnosis not present

## 2020-10-05 DIAGNOSIS — I361 Nonrheumatic tricuspid (valve) insufficiency: Secondary | ICD-10-CM | POA: Diagnosis not present

## 2020-10-05 DIAGNOSIS — R188 Other ascites: Secondary | ICD-10-CM | POA: Diagnosis not present

## 2020-10-05 DIAGNOSIS — Z538 Procedure and treatment not carried out for other reasons: Secondary | ICD-10-CM | POA: Diagnosis not present

## 2020-10-05 DIAGNOSIS — Z452 Encounter for adjustment and management of vascular access device: Secondary | ICD-10-CM | POA: Diagnosis not present

## 2020-10-05 DIAGNOSIS — L8990 Pressure ulcer of unspecified site, unspecified stage: Secondary | ICD-10-CM | POA: Insufficient documentation

## 2020-10-05 DIAGNOSIS — Z20822 Contact with and (suspected) exposure to covid-19: Secondary | ICD-10-CM | POA: Diagnosis present

## 2020-10-05 DIAGNOSIS — Z515 Encounter for palliative care: Secondary | ICD-10-CM

## 2020-10-05 DIAGNOSIS — K5900 Constipation, unspecified: Secondary | ICD-10-CM | POA: Diagnosis not present

## 2020-10-05 DIAGNOSIS — J189 Pneumonia, unspecified organism: Secondary | ICD-10-CM | POA: Diagnosis not present

## 2020-10-05 DIAGNOSIS — K8020 Calculus of gallbladder without cholecystitis without obstruction: Secondary | ICD-10-CM | POA: Diagnosis not present

## 2020-10-05 DIAGNOSIS — I951 Orthostatic hypotension: Secondary | ICD-10-CM

## 2020-10-05 DIAGNOSIS — Z800 Family history of malignant neoplasm of digestive organs: Secondary | ICD-10-CM

## 2020-10-05 DIAGNOSIS — J1282 Pneumonia due to coronavirus disease 2019: Secondary | ICD-10-CM | POA: Diagnosis present

## 2020-10-05 DIAGNOSIS — T451X5A Adverse effect of antineoplastic and immunosuppressive drugs, initial encounter: Secondary | ICD-10-CM | POA: Diagnosis present

## 2020-10-05 DIAGNOSIS — Z4659 Encounter for fitting and adjustment of other gastrointestinal appliance and device: Secondary | ICD-10-CM

## 2020-10-05 DIAGNOSIS — L409 Psoriasis, unspecified: Secondary | ICD-10-CM | POA: Diagnosis present

## 2020-10-05 DIAGNOSIS — K5730 Diverticulosis of large intestine without perforation or abscess without bleeding: Secondary | ICD-10-CM | POA: Diagnosis not present

## 2020-10-05 DIAGNOSIS — Z833 Family history of diabetes mellitus: Secondary | ICD-10-CM

## 2020-10-05 DIAGNOSIS — E119 Type 2 diabetes mellitus without complications: Secondary | ICD-10-CM | POA: Diagnosis not present

## 2020-10-05 DIAGNOSIS — E860 Dehydration: Secondary | ICD-10-CM | POA: Diagnosis present

## 2020-10-05 DIAGNOSIS — D630 Anemia in neoplastic disease: Secondary | ICD-10-CM | POA: Diagnosis present

## 2020-10-05 DIAGNOSIS — Z8249 Family history of ischemic heart disease and other diseases of the circulatory system: Secondary | ICD-10-CM

## 2020-10-05 DIAGNOSIS — N83209 Unspecified ovarian cyst, unspecified side: Secondary | ICD-10-CM | POA: Diagnosis not present

## 2020-10-05 DIAGNOSIS — R627 Adult failure to thrive: Secondary | ICD-10-CM | POA: Diagnosis present

## 2020-10-05 HISTORY — DX: Moderate protein-calorie malnutrition: E44.0

## 2020-10-05 HISTORY — DX: Nausea with vomiting, unspecified: R11.2

## 2020-10-05 HISTORY — DX: COVID-19: U07.1

## 2020-10-05 HISTORY — DX: Unspecified cirrhosis of liver: K74.60

## 2020-10-05 HISTORY — DX: Neoplasm related pain (acute) (chronic): G89.3

## 2020-10-05 HISTORY — DX: Iron deficiency anemia, unspecified: D50.9

## 2020-10-05 HISTORY — DX: Adult failure to thrive: R62.7

## 2020-10-05 HISTORY — DX: Malignant neoplasm of right ovary: C56.1

## 2020-10-05 HISTORY — DX: Malignant ascites: R18.0

## 2020-10-05 HISTORY — DX: Acute kidney failure, unspecified: N17.9

## 2020-10-05 LAB — CBC WITH DIFFERENTIAL/PLATELET
Abs Immature Granulocytes: 0.04 10*3/uL (ref 0.00–0.07)
Basophils Absolute: 0 10*3/uL (ref 0.0–0.1)
Basophils Relative: 0 %
Eosinophils Absolute: 0 10*3/uL (ref 0.0–0.5)
Eosinophils Relative: 0 %
HCT: 36.4 % (ref 36.0–46.0)
Hemoglobin: 11.6 g/dL — ABNORMAL LOW (ref 12.0–15.0)
Immature Granulocytes: 1 %
Lymphocytes Relative: 6 %
Lymphs Abs: 0.4 10*3/uL — ABNORMAL LOW (ref 0.7–4.0)
MCH: 26.4 pg (ref 26.0–34.0)
MCHC: 31.9 g/dL (ref 30.0–36.0)
MCV: 82.9 fL (ref 80.0–100.0)
Monocytes Absolute: 0.3 10*3/uL (ref 0.1–1.0)
Monocytes Relative: 5 %
Neutro Abs: 5.9 10*3/uL (ref 1.7–7.7)
Neutrophils Relative %: 88 %
Platelets: 375 10*3/uL (ref 150–400)
RBC: 4.39 MIL/uL (ref 3.87–5.11)
RDW: 16.2 % — ABNORMAL HIGH (ref 11.5–15.5)
WBC: 6.6 10*3/uL (ref 4.0–10.5)
nRBC: 0 % (ref 0.0–0.2)

## 2020-10-05 LAB — D-DIMER, QUANTITATIVE: D-Dimer, Quant: 3.04 ug/mL-FEU — ABNORMAL HIGH (ref 0.00–0.50)

## 2020-10-05 LAB — PROTEIN, PLEURAL OR PERITONEAL FLUID
Total protein, fluid: 5.3 g/dL
Total protein, fluid: 5.2 g/dL

## 2020-10-05 LAB — COMPREHENSIVE METABOLIC PANEL
ALT: 12 U/L (ref 0–44)
AST: 16 U/L (ref 15–41)
Albumin: 3 g/dL — ABNORMAL LOW (ref 3.5–5.0)
Alkaline Phosphatase: 122 U/L (ref 38–126)
Anion gap: 12 (ref 5–15)
BUN: 93 mg/dL — ABNORMAL HIGH (ref 8–23)
CO2: 18 mmol/L — ABNORMAL LOW (ref 22–32)
Calcium: 9 mg/dL (ref 8.9–10.3)
Chloride: 101 mmol/L (ref 98–111)
Creatinine, Ser: 3.34 mg/dL — ABNORMAL HIGH (ref 0.44–1.00)
GFR, Estimated: 15 mL/min — ABNORMAL LOW (ref >60)
Glucose, Bld: 96 mg/dL (ref 70–99)
Potassium: 5.2 mmol/L — ABNORMAL HIGH (ref 3.5–5.1)
Sodium: 131 mmol/L — ABNORMAL LOW (ref 135–145)
Total Bilirubin: 0.7 mg/dL (ref 0.3–1.2)
Total Protein: 7.4 g/dL (ref 6.5–8.1)

## 2020-10-05 LAB — LACTATE DEHYDROGENASE, PLEURAL OR PERITONEAL FLUID
LD, Fluid: 487 U/L — ABNORMAL HIGH (ref 3–23)
LD, Fluid: 497 U/L — ABNORMAL HIGH (ref 3–23)

## 2020-10-05 LAB — URINALYSIS, ROUTINE W REFLEX MICROSCOPIC
Bilirubin Urine: NEGATIVE
Glucose, UA: NEGATIVE mg/dL
Ketones, ur: 5 mg/dL — AB
Nitrite: NEGATIVE
Protein, ur: NEGATIVE mg/dL
Specific Gravity, Urine: 1.016 (ref 1.005–1.030)
pH: 5 (ref 5.0–8.0)

## 2020-10-05 LAB — BODY FLUID CELL COUNT WITH DIFFERENTIAL
Eos, Fluid: 0 %
Lymphs, Fluid: 56 %
Monocyte-Macrophage-Serous Fluid: 25 % — ABNORMAL LOW (ref 50–90)
Neutrophil Count, Fluid: 19 % (ref 0–25)
Total Nucleated Cell Count, Fluid: 351 cu mm (ref 0–1000)

## 2020-10-05 LAB — CREATININE, URINE, RANDOM: Creatinine, Urine: 153.91 mg/dL

## 2020-10-05 LAB — TROPONIN I (HIGH SENSITIVITY)
Troponin I (High Sensitivity): 11 ng/L (ref <18)
Troponin I (High Sensitivity): 11 ng/L (ref <18)

## 2020-10-05 LAB — PROTIME-INR
INR: 1.1 (ref 0.8–1.2)
Prothrombin Time: 13.9 seconds (ref 11.4–15.2)

## 2020-10-05 LAB — FERRITIN: Ferritin: 607 ng/mL — ABNORMAL HIGH (ref 11–307)

## 2020-10-05 LAB — AMMONIA: Ammonia: 19 umol/L (ref 9–35)

## 2020-10-05 LAB — GLUCOSE, PLEURAL OR PERITONEAL FLUID: Glucose, Fluid: 50 mg/dL

## 2020-10-05 LAB — RESP PANEL BY RT-PCR (FLU A&B, COVID) ARPGX2
Influenza A by PCR: NEGATIVE
Influenza B by PCR: NEGATIVE
SARS Coronavirus 2 by RT PCR: POSITIVE — AB

## 2020-10-05 LAB — ALBUMIN, PLEURAL OR PERITONEAL FLUID
Albumin, Fluid: 2.6 g/dL
Albumin, Fluid: 2.6 g/dL

## 2020-10-05 LAB — PROCALCITONIN: Procalcitonin: 0.35 ng/mL

## 2020-10-05 LAB — FIBRINOGEN: Fibrinogen: 674 mg/dL — ABNORMAL HIGH (ref 210–475)

## 2020-10-05 LAB — SODIUM, URINE, RANDOM: Sodium, Ur: 63 mmol/L

## 2020-10-05 LAB — LACTIC ACID, PLASMA
Lactic Acid, Venous: 1.4 mmol/L (ref 0.5–1.9)
Lactic Acid, Venous: 1.2 mmol/L (ref 0.5–1.9)

## 2020-10-05 LAB — APTT: aPTT: 30 seconds (ref 24–36)

## 2020-10-05 LAB — CBG MONITORING, ED: Glucose-Capillary: 70 mg/dL (ref 70–99)

## 2020-10-05 MED ORDER — SIMVASTATIN 20 MG PO TABS
20.0000 mg | ORAL_TABLET | Freq: Every day | ORAL | Status: DC
  Administered 2020-10-05 – 2020-10-12 (×8): 20 mg via ORAL
  Filled 2020-10-05 (×8): qty 1

## 2020-10-05 MED ORDER — ONDANSETRON HCL 4 MG/2ML IJ SOLN
4.0000 mg | Freq: Four times a day (QID) | INTRAMUSCULAR | Status: DC | PRN
  Administered 2020-10-09 – 2020-10-23 (×33): 4 mg via INTRAVENOUS
  Filled 2020-10-05 (×36): qty 2

## 2020-10-05 MED ORDER — FENTANYL CITRATE (PF) 100 MCG/2ML IJ SOLN
50.0000 ug | Freq: Once | INTRAMUSCULAR | Status: AC
  Administered 2020-10-05: 50 ug via INTRAVENOUS
  Filled 2020-10-05: qty 2

## 2020-10-05 MED ORDER — SODIUM CHLORIDE 0.9 % IV SOLN
2.0000 g | INTRAVENOUS | Status: DC
  Administered 2020-10-06 – 2020-10-08 (×3): 2 g via INTRAVENOUS
  Filled 2020-10-05 (×2): qty 20
  Filled 2020-10-05: qty 2

## 2020-10-05 MED ORDER — METRONIDAZOLE 500 MG/100ML IV SOLN
500.0000 mg | Freq: Once | INTRAVENOUS | Status: AC
  Administered 2020-10-05: 500 mg via INTRAVENOUS
  Filled 2020-10-05: qty 100

## 2020-10-05 MED ORDER — SODIUM ZIRCONIUM CYCLOSILICATE 10 G PO PACK
10.0000 g | PACK | Freq: Once | ORAL | Status: AC
  Administered 2020-10-05: 10 g via ORAL
  Filled 2020-10-05: qty 1

## 2020-10-05 MED ORDER — ONDANSETRON HCL 4 MG PO TABS
4.0000 mg | ORAL_TABLET | Freq: Four times a day (QID) | ORAL | Status: DC | PRN
  Administered 2020-10-07 – 2020-10-15 (×2): 4 mg via ORAL
  Filled 2020-10-05 (×2): qty 1

## 2020-10-05 MED ORDER — LACTATED RINGERS IV SOLN: INTRAVENOUS | Status: DC

## 2020-10-05 MED ORDER — LACTATED RINGERS IV BOLUS (SEPSIS)
500.0000 mL | Freq: Once | INTRAVENOUS | Status: AC
  Administered 2020-10-05: 500 mL via INTRAVENOUS

## 2020-10-05 MED ORDER — LIDOCAINE HCL 1 % IJ SOLN
INTRAMUSCULAR | Status: AC
  Filled 2020-10-05: qty 20

## 2020-10-05 MED ORDER — ALBUMIN HUMAN 25 % IV SOLN
25.0000 g | Freq: Four times a day (QID) | INTRAVENOUS | Status: AC
  Administered 2020-10-05 – 2020-10-07 (×8): 25 g via INTRAVENOUS
  Filled 2020-10-05 (×8): qty 100

## 2020-10-05 MED ORDER — INSULIN ASPART 100 UNIT/ML IJ SOLN
0.0000 [IU] | Freq: Three times a day (TID) | INTRAMUSCULAR | Status: DC
  Filled 2020-10-05: qty 0.09

## 2020-10-05 MED ORDER — ONDANSETRON HCL 4 MG/2ML IJ SOLN
4.0000 mg | Freq: Once | INTRAMUSCULAR | Status: AC
  Administered 2020-10-05: 4 mg via INTRAVENOUS
  Filled 2020-10-05: qty 2

## 2020-10-05 MED ORDER — NOREPINEPHRINE 4 MG/250ML-% IV SOLN: 0.0000 ug/min | INTRAVENOUS | Status: DC

## 2020-10-05 MED ORDER — OXYCODONE HCL 5 MG PO TABS
5.0000 mg | ORAL_TABLET | ORAL | Status: DC | PRN
  Administered 2020-10-07 – 2020-10-13 (×14): 5 mg via ORAL
  Filled 2020-10-05 (×14): qty 1

## 2020-10-05 MED ORDER — PANTOPRAZOLE SODIUM 40 MG PO TBEC
40.0000 mg | DELAYED_RELEASE_TABLET | Freq: Every day | ORAL | Status: DC
Start: 2020-10-05 — End: 2020-10-08
  Administered 2020-10-05 – 2020-10-07 (×3): 40 mg via ORAL
  Filled 2020-10-05 (×3): qty 1

## 2020-10-05 MED ORDER — ACETAMINOPHEN 650 MG RE SUPP: 650.0000 mg | Freq: Four times a day (QID) | RECTAL | Status: DC | PRN

## 2020-10-05 MED ORDER — SODIUM CHLORIDE 0.9 % IV SOLN
2.0000 g | Freq: Once | INTRAVENOUS | Status: AC
  Administered 2020-10-05: 2 g via INTRAVENOUS
  Filled 2020-10-05: qty 20

## 2020-10-05 MED ORDER — MORPHINE SULFATE (PF) 4 MG/ML IV SOLN
6.0000 mg | Freq: Once | INTRAVENOUS | Status: DC
  Filled 2020-10-05: qty 2

## 2020-10-05 MED ORDER — FENTANYL CITRATE (PF) 100 MCG/2ML IJ SOLN: 100.0000 ug | Freq: Once | INTRAMUSCULAR | Status: DC

## 2020-10-05 MED ORDER — HEPARIN SODIUM (PORCINE) 5000 UNIT/ML IJ SOLN
5000.0000 [IU] | Freq: Two times a day (BID) | INTRAMUSCULAR | Status: DC
  Administered 2020-10-05 – 2020-10-21 (×28): 5000 [IU] via SUBCUTANEOUS
  Filled 2020-10-05 (×29): qty 1

## 2020-10-05 MED ORDER — ACETAMINOPHEN 325 MG PO TABS
650.0000 mg | ORAL_TABLET | Freq: Four times a day (QID) | ORAL | Status: DC | PRN
  Administered 2020-10-12: 650 mg via ORAL
  Filled 2020-10-05: qty 2

## 2020-10-05 MED ORDER — LIDOCAINE HCL (PF) 1 % IJ SOLN
5.0000 mL | Freq: Once | INTRAMUSCULAR | Status: AC
  Administered 2020-10-05: 5 mL via INTRADERMAL
  Filled 2020-10-05: qty 30

## 2020-10-05 NOTE — ED Notes (Signed)
Pt to CT at this time.

## 2020-10-05 NOTE — ED Notes (Signed)
Provider back at bedside to re-assess. 

## 2020-10-05 NOTE — ED Provider Notes (Addendum)
[facility] DEPT Provider Note   CSN: [id number] Arrival date & time: 10/05/20  1058     History Chief Complaint  Patient presents with   Abdominal Pain    Vomiting, diarrhea    Marlou Castrellon is a 65 y.o. female with PMH HTN, T2DM, psoriasis who presents to the emergency department for evaluation of distention, abdominal pain, diarrhea and vomiting.  Patient states that she has been feeling poorly for approximately 1 week and presents because she is having difficulty tolerating p.o.  She endorses generalized abdominal pain, back pain then general myalgias.  She arrives hypotensive with systolics in the mid [id number] tachypneic, but is alert and oriented answering all questions appropriately.  Denies chest pain, headache, fever or other systemic symptoms.   Abdominal Pain Associated symptoms: shortness of breath   Associated symptoms: no chest pain, no chills, no cough, no dysuria, no fever, no hematuria, no sore throat and no vomiting       Past Medical History:  Diagnosis Date   Hypertension    Psoriasis     Patient Active Problem List   Diagnosis Date Noted   Dyspepsia 09/10/2020   Type 2 diabetes mellitus with hyperglycemia ([place]) 09/10/2020   SOB (shortness of breath) 09/10/2020   Hyperlipidemia associated with type 2 diabetes mellitus ([place]) 10/23/2016   Osteoarthritis of both knees 07/08/2014   URTICARIA 01/09/2009   GASTROENTERITIS 11/03/2008   Essential hypertension 03/28/2007   PSORIASIS 03/28/2007    History reviewed. No pertinent surgical history.   OB History   No obstetric history on file.     Family History  Problem Relation Age of Onset   Stroke Mother    Heart disease Sister    Heart attack Maternal Grandfather 71   Diabetes Other    Colon cancer Other    Lung cancer Other    Cancer Other        Bladder cancer   Hypertension Other    Thyroid disease Other        Hypothyroid    Social History   Tobacco Use    Smoking status: Never   Smokeless tobacco: Never  Substance Use Topics   Alcohol use: No   Drug use: No    Home Medications Prior to Admission medications   Medication Sig Start Date End Date Taking? Authorizing Provider  simvastatin (ZOCOR) 20 MG tablet Take 20 mg by mouth daily.   Yes [provider]  empagliflozin (JARDIANCE) 10 MG TABS tablet Take 1 tablet (10 mg total) by mouth daily. 09/15/20   Ann Held, DO  lisinopril-hydrochlorothiazide (ZESTORETIC) 10-12.5 MG tablet Take 1 tablet by mouth daily. 09/10/20   Ann Held, DO  metFORMIN (GLUCOPHAGE) 500 MG tablet Take 1 tablet (500 mg total) by mouth daily with breakfast. 09/10/20   Carollee Herter, Alferd Apa, DO  omeprazole (PRILOSEC) 20 MG capsule Take 1 capsule (20 mg total) by mouth daily. 09/10/20   Ann Held, DO    Allergies    Patient has no known allergies.  Review of Systems   Review of Systems  Constitutional:  Negative for chills and fever.  HENT:  Negative for ear pain and sore throat.   Eyes:  Negative for pain and visual disturbance.  Respiratory:  Positive for shortness of breath. Negative for cough.   Cardiovascular:  Positive for leg swelling. Negative for chest pain and palpitations.  Gastrointestinal:  Positive for abdominal pain. Negative for vomiting.  Genitourinary:  Negative for dysuria and hematuria.  Musculoskeletal:  Positive for back pain. Negative for arthralgias.  Skin:  Negative for color change and rash.  Neurological:  Negative for seizures and syncope.  All other systems reviewed and are negative.  Physical Exam Updated Vital Signs BP 94/65   Pulse (!) 110   Temp 97.6 F (36.4 C) (Oral)   Resp (!) 28   SpO2 98%   Physical Exam Vitals and nursing note reviewed.  Constitutional:      General: She is not in acute distress.    Appearance: She is well-developed.  HENT:     Head: Normocephalic and atraumatic.  Eyes:     Conjunctiva/sclera:  Conjunctivae normal.  Cardiovascular:     Rate and Rhythm: Normal rate and regular rhythm.     Heart sounds: No murmur heard. Pulmonary:     Effort: Pulmonary effort is normal. No respiratory distress.     Breath sounds: Normal breath sounds.  Abdominal:     General: There is distension.     Palpations: Abdomen is soft.     Tenderness: There is generalized abdominal tenderness.  Musculoskeletal:     Cervical back: Neck supple.     Right lower leg: Edema present.     Left lower leg: Edema present.  Skin:    General: Skin is warm and dry.  Neurological:     Mental Status: She is alert.    ED Results / Procedures / Treatments   Labs (all labs ordered are listed, but only abnormal results are displayed) Labs Reviewed  RESP PANEL BY RT-PCR (FLU A&B, COVID) ARPGX2 - Abnormal; Notable for the following components:      Result Value   SARS Coronavirus 2 by RT PCR POSITIVE (*)    All other components within normal limits  COMPREHENSIVE METABOLIC PANEL - Abnormal; Notable for the following components:   Sodium 131 (*)    Potassium 5.2 (*)    CO2 18 (*)    BUN 93 (*)    Creatinine, Ser 3.34 (*)    Albumin 3.0 (*)    GFR, Estimated 15 (*)    All other components within normal limits  CBC WITH DIFFERENTIAL/PLATELET - Abnormal; Notable for the following components:   Hemoglobin 11.6 (*)    RDW 16.2 (*)    Lymphs Abs 0.4 (*)    All other components within normal limits  CULTURE, BLOOD (ROUTINE X 2)  CULTURE, BLOOD (ROUTINE X 2)  URINE CULTURE  BODY FLUID CULTURE W GRAM STAIN  LACTIC ACID, PLASMA  PROTIME-INR  APTT  LACTIC ACID, PLASMA  URINALYSIS, ROUTINE W REFLEX MICROSCOPIC  LACTATE DEHYDROGENASE, PLEURAL OR PERITONEAL FLUID  GLUCOSE, PLEURAL OR PERITONEAL FLUID  PROTEIN, PLEURAL OR PERITONEAL FLUID  ALBUMIN, PLEURAL OR PERITONEAL FLUID   AMMONIA    EKG None  Radiology CT ABDOMEN PELVIS WO CONTRAST  Result Date: 10/05/2020 CLINICAL DATA:  Abdominal distension and  pain. Vomiting and diarrhea. Sepsis. EXAM: CT ABDOMEN AND PELVIS WITHOUT CONTRAST TECHNIQUE: Multidetector CT imaging of the abdomen and pelvis was performed following the standard protocol without IV contrast. COMPARISON:  None. FINDINGS: Lower chest: No acute findings. Hepatobiliary: No mass visualized on this unenhanced exam. 2 cm gallstone is noted, however there are no signs of acute cholecystitis or biliary ductal dilatation. Pancreas: No mass or inflammatory process visualized on this unenhanced exam. Spleen:  Within normal limits in size. Adrenals/Urinary tract: No evidence of urolithiasis or hydronephrosis. Unremarkable unopacified urinary bladder. Stomach/Bowel: No evidence of  obstruction, inflammatory process, or abnormal fluid collections. Diverticulosis is seen mainly involving the sigmoid colon, however there is no evidence of diverticulitis. Vascular/Lymphatic: No pathologically enlarged lymph nodes identified. No evidence of abdominal aortic aneurysm. Aortic atherosclerotic calcification noted. Reproductive:  No mass or other significant abnormality. Other: Large amount of ascites is seen throughout the abdomen and pelvis. Musculoskeletal:  No suspicious bone lesions identified. IMPRESSION: Large amount of ascites. Cholelithiasis. No radiographic evidence of acute cholecystitis. Mild sigmoid diverticulosis, without radiographic evidence of diverticulitis. Aortic Atherosclerosis (ICD10-I70.0). Electronically Signed   By: Marlaine Hind M.D.   On: 10/05/2020 13:29   DG Chest Port 1 View  Result Date: 10/05/2020 CLINICAL DATA:  Questionable sepsis EXAM: PORTABLE CHEST 1 VIEW COMPARISON:  None. FINDINGS: The patient is rotated to the right. The cardiomediastinal silhouette is within normal limits, allowing for low lung volumes and AP technique. Lung volumes are low. There is no focal consolidation or pulmonary edema. There is no pleural effusion or pneumothorax. There is no acute osseous abnormality.  IMPRESSION: Low lung volumes. Otherwise, no radiographic evidence of acute cardiopulmonary process. Electronically Signed   By: Valetta Mole M.D.   On: 10/05/2020 12:26   US Abdomen Limited RUQ (LIVER/GB)  Result Date: 10/05/2020 CLINICAL DATA:  Right upper quadrant abdominal pain EXAM: ULTRASOUND ABDOMEN LIMITED RIGHT UPPER QUADRANT COMPARISON:  10/05/2020 CT abdomen/pelvis FINDINGS: Gallbladder: Nondistended gallbladder is completely filled with a densely shadowing 3.6 cm gallstone. No gallbladder wall thickening. No sonographic Murphy's sign. Common bile duct: Diameter: 6 mm on limited views, top-normal Liver: Coarsened liver parenchyma with questionable liver surface irregularity, which may indicate cirrhosis. No liver mass demonstrated on these limited views. Portal vein is patent on color Doppler imaging with normal direction of blood flow towards the liver. Other: Large volume ascites in the upper abdomen. IMPRESSION: 1. Cholelithiasis.  No evidence of acute cholecystitis. 2. Top normal caliber common bile duct, 6 mm diameter. 3. Coarsened liver parenchyma with questionable liver surface irregularity, which may indicate cirrhosis. No liver mass detected on these limited views. Suggest correlation with liver function tests. 4. Large volume upper abdominal ascites. Electronically Signed   By: Ilona Sorrel M.D.   On: 10/05/2020 14:37    Procedures .Paracentesis  Date/Time: 10/05/2020 4:03 PM Performed by: Teressa Lower, MD Authorized by: Teressa Lower, MD   Consent:    Consent obtained:  Written   Consent given by:  Patient   Risks, benefits, and alternatives were discussed: yes     Risks discussed:  Bleeding, bowel perforation, infection and pain Pre-procedure details:    Procedure purpose:  Diagnostic Anesthesia:    Anesthesia method:  Local infiltration   Local anesthetic:  Lidocaine 1% w/o epi Procedure details:    Needle gauge:  18   Ultrasound guidance: yes     Puncture site:   R lower quadrant   Fluid removed amount:  50 cc   Fluid appearance:  Amber   Dressing:  Adhesive bandage Post-procedure details:    Procedure completion:  Tolerated well, no immediate complications   Medications Ordered in ED Medications  albumin human 25 % solution 25 g (has no administration in time range)  morphine 4 MG/ML injection 6 mg (has no administration in time range)  lidocaine (XYLOCAINE) 1 % (with pres) injection (has no administration in time range)  lactated ringers bolus 500 mL (500 mLs Intravenous New Bag/Given 10/05/20 1152)  cefTRIAXone (ROCEPHIN) 2 g in sodium chloride 0.9 % 100 mL IVPB (2 g Intravenous New Bag/Given  10/05/20 1154)  metroNIDAZOLE (FLAGYL) IVPB 500 mg (500 mg Intravenous New Bag/Given 10/05/20 1239)  fentaNYL (SUBLIMAZE) injection 50 mcg (50 mcg Intravenous Given 10/05/20 1325)  ondansetron (ZOFRAN) injection 4 mg (4 mg Intravenous Given 10/05/20 1325)  lidocaine (PF) (XYLOCAINE) 1 % injection 5 mL (5 mLs Intradermal Given 10/05/20 1505)    ED Course  I have reviewed the triage vital signs and the nursing notes.  Pertinent labs & imaging results that were available during my care of the patient were reviewed by me and considered in my medical decision making (see chart for details).    MDM Rules/Calculators/A&P                           For evaluation of abdominal pain, shortness of breath and hypotension.  Physical exam reveals abdominal distention with generalized abdominal tenderness, lower extremity bilateral 2+ pitting edema.  Cardiac exam reveals regular tachycardia.  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  Initial laboratory evaluation with mild hyponatremia 131, potassium 5.2, significant creatinine elevation to 3.34 and a BUN of 93 which is an elevation for this patient.  COVID test positive, CBC unremarkable, lactate 1.4, INR unremarkable.  A sepsis alert was called during my  initial examination due to hypotension and tachycardia but the patient was only given 500 cc of fluid and not 30 cc/kg as the patient is clinically fluid overloaded and has a normal lactate.  Pressures improved with mild IVF.  CT abdomen pelvis with significant ascites and cholelithiasis but no cholecystitis.  Right upper quadrant ultrasound with evidence of cirrhosis and confirmed CT findings.  Chest x-ray unremarkable.  Patient empirically treated with ceftriaxone and Flagyl.  A diagnostic paracentesis was performed here in the emergency department will require admission for management of AKI, ascites of unknown origin and COVID-19.  CRITICAL CARE Performed by: Debe Coder Hondo Nanda  ?  Total critical care time: 90 minutes  Critical care time was exclusive of separately billable procedures and treating other patients.  Critical care was necessary to treat or prevent imminent or life-threatening deterioration.  Critical care was time spent personally by me on the following activities: development of treatment plan with patient and/or surrogate as well as nursing, discussions with consultants, evaluation of patient's response to treatment, examination of patient, obtaining history from patient or surrogate, ordering and performing treatments and interventions, ordering and review of laboratory studies, ordering and review of radiographic studies, pulse oximetry and re-evaluation of patient's condition.  Final Clinical Impression(s) / ED Diagnoses Final diagnoses:  RUQ pain    Rx / DC Orders ED Discharge Orders     None        Arleigh Odowd, MD 10/05/20 1600    Teressa Lower, MD 10/05/20 [phone]

## 2020-10-05 NOTE — ED Notes (Signed)
Abd paracentesis, performed by ED provider, complete at this time.

## 2020-10-05 NOTE — ED Notes (Signed)
ED provider at the bedside for abd paracentesis.

## 2020-10-05 NOTE — ED Notes (Signed)
Pt back from CT at this time 

## 2020-10-05 NOTE — ED Notes (Signed)
Hospitalist at the bedside to re-assess.

## 2020-10-05 NOTE — ED Triage Notes (Signed)
Pt presents from home for abd distention and pain with accompanying sx of vomiting and diarrhea which began one week ago, per EMS. Pt denies a hx of small bowel obstruction. Pt has not received her BP medications in one week.

## 2020-10-05 NOTE — Procedures (Signed)
Ultrasound-guided diagnostic and therapeutic paracentesis performed yielding 3.5 liters of hazy, amber/blood-tinged fluid. No immediate complications. A portion of the fluid was sent to the lab for preordered studies. EBL none.

## 2020-10-05 NOTE — ED Notes (Signed)
Provider declines repeat lactic at this time.

## 2020-10-05 NOTE — ED Notes (Signed)
Hospitalist messaged regarding pt's Albumin dosage. Awaiting further orders at this time.

## 2020-10-05 NOTE — ED Notes (Signed)
Hospitalist at the bedside 

## 2020-10-05 NOTE — ED Notes (Signed)
IR at the bedside for paracentesis

## 2020-10-05 NOTE — ED Notes (Signed)
Lonia Blood (pt's sister)  [phone]

## 2020-10-05 NOTE — ED Notes (Signed)
Provider at the bedside.  

## 2020-10-05 NOTE — ED Notes (Signed)
ED provider back at the bedside to re-assess.

## 2020-10-05 NOTE — ED Notes (Signed)
ED TO INPATIENT HANDOFF REPORT  Name/Age/Gender Alpha Gula 65 y.o. female  Code Status    Code Status Orders  (From admission, onward)         Start     Ordered   10/05/20 1703  Full code  Continuous        10/05/20 1703        Code Status History    This patient has a current code status but no historical code status.      Home/SNF/Other Home  Chief Complaint Cirrhosis ([place]) [K74.60]  Level of Care/Admitting Diagnosis ED Disposition    ED Disposition  Admit   Condition  --   Comment  Hospital Area: [place] [100102]  Level of Care: Telemetry [5]  Admit to tele based on following criteria: Other see comments  Comments: AKI  May admit patient to [facility] or [facility] if equivalent level of care is available:: Yes  Covid Evaluation: Confirmed COVID Positive  Diagnosis: Cirrhosis [facility]) [id number]  Admitting Physician: Lequita Halt [id number]  Attending Physician: Lequita Halt [id number]  Estimated length of stay: past midnight tomorrow  Certification:: I certify this patient will need inpatient services for at least 2 midnights         Medical History Past Medical History:  Diagnosis Date  . Hypertension   . Psoriasis     Allergies No Known Allergies  IV Location/Drains/Wounds Patient Lines/Drains/Airways Status    Active Line/Drains/Airways    Name Placement date Placement time Site Days   Peripheral IV 10/05/20 18 G Left Antecubital 10/05/20  1116  Antecubital  less than 1   Peripheral IV 10/05/20 18 G Right Antecubital 10/05/20  1145  Antecubital  less than 1          Labs/Imaging Results for orders placed or performed during the hospital encounter of 10/05/20 (from the past 48 hour(s))  Lactic acid, plasma     Status: None   Collection Time: 10/05/20 11:36 AM  Result Value Ref Range   Lactic Acid, Venous 1.4 0.5 - 1.9 mmol/L    Comment: Performed at [facility], [place] [address]  [place], [place] [address]  Comprehensive metabolic panel     Status: Abnormal   Collection Time: 10/05/20 11:36 AM  Result Value Ref Range   Sodium 131 (L) 135 - 145 mmol/L   Potassium 5.2 (H) 3.5 - 5.1 mmol/L   Chloride 101 98 - 111 mmol/L   CO2 18 (L) 22 - 32 mmol/L   Glucose, Bld 96 70 - 99 mg/dL    Comment: Glucose reference range applies only to samples taken after fasting for at least 8 hours.   BUN 93 (H) 8 - 23 mg/dL   Creatinine, Ser 3.34 (H) 0.44 - 1.00 mg/dL   Calcium 9.0 8.9 - 10.3 mg/dL   Total Protein 7.4 6.5 - 8.1 g/dL   Albumin 3.0 (L) 3.5 - 5.0 g/dL   AST 16 15 - 41 U/L   ALT 12 0 - 44 U/L   Alkaline Phosphatase 122 38 - 126 U/L   Total Bilirubin 0.7 0.3 - 1.2 mg/dL   GFR, Estimated 15 (L) >60 mL/min    Comment: (NOTE) Calculated using the CKD-EPI Creatinine Equation (2021)    Anion gap 12 5 - 15    Comment: Performed at [facility], [place] [address], [place], [place] [address]  CBC WITH DIFFERENTIAL     Status: Abnormal   Collection Time:  10/05/20 11:36 AM  Result Value Ref Range   WBC 6.6 4.0 - 10.5 K/uL   RBC 4.39 3.87 - 5.11 MIL/uL   Hemoglobin 11.6 (L) 12.0 - 15.0 g/dL   HCT 36.4 36.0 - 46.0 %   MCV 82.9 80.0 - 100.0 fL   MCH 26.4 26.0 - 34.0 pg   MCHC 31.9 30.0 - 36.0 g/dL   RDW 16.2 (H) 11.5 - 15.5 %   Platelets 375 150 - 400 K/uL   nRBC 0.0 0.0 - 0.2 %   Neutrophils Relative % 88 %   Neutro Abs 5.9 1.7 - 7.7 K/uL   Lymphocytes Relative 6 %   Lymphs Abs 0.4 (L) 0.7 - 4.0 K/uL   Monocytes Relative 5 %   Monocytes Absolute 0.3 0.1 - 1.0 K/uL   Eosinophils Relative 0 %   Eosinophils Absolute 0.0 0.0 - 0.5 K/uL   Basophils Relative 0 %   Basophils Absolute 0.0 0.0 - 0.1 K/uL   Immature Granulocytes 1 %   Abs Immature Granulocytes 0.04 0.00 - 0.07 K/uL    Comment: Performed at [facility], [place] [address], [place], [place] [address]  Protime-INR     Status: None   Collection Time: 10/05/20 11:36 AM  Result Value  Ref Range   Prothrombin Time 13.9 11.4 - 15.2 seconds   INR 1.1 0.8 - 1.2    Comment: (NOTE) INR goal varies based on device and disease states. Performed at [facility], [place] [address], [place], [place] [address]   APTT     Status: None   Collection Time: 10/05/20 11:36 AM  Result Value Ref Range   aPTT 30 24 - 36 seconds    Comment: Performed at [facility], [place] [address], [place], [place] [address]  Resp Panel by RT-PCR (Flu A&B, Covid) Nasopharyngeal Swab     Status: Abnormal   Collection Time: 10/05/20 12:00 PM   Specimen: Nasopharyngeal Swab; Nasopharyngeal(NP) swabs in vial transport medium  Result Value Ref Range   SARS Coronavirus 2 by RT PCR POSITIVE (A) NEGATIVE    Comment: RESULT CALLED TO, READ BACK BY AND VERIFIED WITH: Halina Maidens, RN 10/05/20 1446 KDS (NOTE) SARS-CoV-2 target nucleic acids are DETECTED.  The SARS-CoV-2 RNA is generally detectable in upper respiratory specimens during the acute phase of infection. Positive results are indicative of the presence of the identified virus, but do not rule out bacterial infection or co-infection with other pathogens not detected by the test. Clinical correlation with patient history and other diagnostic information is necessary to determine patient infection status. The expected result is Negative.  Fact Sheet for Patients: EntrepreneurPulse.com.au  Fact Sheet for Healthcare Providers: IncredibleEmployment.be  This test is not yet approved or cleared by the [place] FDA and  has been authorized for detection and/or diagnosis of SARS-CoV-2 by FDA under an Emergency Use Authorization (EUA).  This EUA will remain in effect (meaning this test can be u sed) for the duration of  the COVID-19 declaration under Section 564(b)(1) of the Act, 21 U.S.C. section 360bbb-3(b)(1), unless the authorization is terminated or revoked sooner.      Influenza A by PCR NEGATIVE NEGATIVE   Influenza B by PCR NEGATIVE NEGATIVE    Comment: (NOTE) The Xpert Xpress SARS-CoV-2/FLU/RSV plus assay is intended as an aid in the diagnosis of influenza from Nasopharyngeal swab specimens and should not be used as a sole basis for treatment. Nasal washings and aspirates are unacceptable for  Xpert Xpress SARS-CoV-2/FLU/RSV testing.  Fact Sheet for Patients: EntrepreneurPulse.com.au  Fact Sheet for Healthcare Providers: IncredibleEmployment.be  This test is not yet approved or cleared by the [place] FDA and has been authorized for detection and/or diagnosis of SARS-CoV-2 by FDA under an Emergency Use Authorization (EUA). This EUA will remain in effect (meaning this test can be used) for the duration of the COVID-19 declaration under Section 564(b)(1) of the Act, 21 U.S.C. section 360bbb-3(b)(1), unless the authorization is terminated or revoked.  Performed at [facility], [place] [address] [place], [place] [address]   Lactate dehydrogenase (pleural or peritoneal fluid)     Status: Abnormal   Collection Time: 10/05/20  3:07 PM  Result Value Ref Range   LD, Fluid 487 (H) 3 - 23 U/L    Comment: (NOTE) Results should be evaluated in conjunction with serum values    Fluid Type-FLDH PERITONEAL CAVITY     Comment: Performed at [facility], [place] [address] [place], [place] [address]  Glucose, pleural or peritoneal fluid     Status: None   Collection Time: 10/05/20  3:07 PM  Result Value Ref Range   Glucose, Fluid 50 mg/dL    Comment: (NOTE) No normal range established for this test Results should be evaluated in conjunction with serum values    Fluid Type-FGLU PERITONEAL CAVITY     Comment: Performed at [facility], [place] [address], [place], [place] [address]  Protein, pleural or peritoneal fluid     Status: None   Collection Time: 10/05/20   3:07 PM  Result Value Ref Range   Total protein, fluid 5.3 g/dL    Comment: (NOTE) No normal range established for this test Results should be evaluated in conjunction with serum values    Fluid Type-FTP PERITONEAL CAVITY     Comment: Performed at [facility], [place] [address], [place],  [address]  Albumin, pleural or peritoneal fluid     Status: None   Collection Time: 10/05/20  3:07 PM  Result Value Ref Range   Albumin, Fluid 2.6 g/dL    Comment: (NOTE) No normal range established for this test Results should be evaluated in conjunction with serum values    Fluid Type-FALB PERITONEAL CAVITY     Comment: Performed at [facility], [place] [address] [place],  [address]  Lactate dehydrogenase (pleural or peritoneal fluid)     Status: Abnormal   Collection Time: 10/05/20  5:02 PM  Result Value Ref Range   LD, Fluid 497 (H) 3 - 23 U/L    Comment: (NOTE) Results should be evaluated in conjunction with serum values    Fluid Type-FLDH PERITONEAL     Comment: RIGHT Performed at [place] [address], [place],  [address] CORRECTED ON 08/15 AT 1744: PREVIOUSLY REPORTED AS CYTO PERI   Body fluid cell count with differential     Status: Abnormal   Collection Time: 10/05/20  5:02 PM  Result Value Ref Range   Fluid Type-FCT PERITONEAL     Comment: RIGHT CORRECTED ON 08/15 AT 1744: PREVIOUSLY REPORTED AS CYTO PERI    Color, Fluid AMBER (A) YELLOW   Appearance, Fluid CLOUDY (A) CLEAR   Total Nucleated Cell Count, Fluid 351 0 - 1,000 cu mm   Neutrophil Count, Fluid 19 0 - 25 %   Lymphs, Fluid 56 %   Monocyte-Macrophage-Serous Fluid 25 (L) 50 - 90 %   Eos, Fluid 0 %  Comment: Performed at [facility], [place] [address] [place], [place] [address]  Albumin, pleural or peritoneal fluid     Status: None   Collection Time: 10/05/20  5:02 PM  Result Value Ref Range   Albumin, Fluid 2.6  g/dL    Comment: (NOTE) No normal range established for this test Results should be evaluated in conjunction with serum values    Fluid Type-FALB PERITONEAL     Comment: RIGHT Performed at [place] [address] [place], [place] [address] CORRECTED ON 08/15 AT 1744: PREVIOUSLY REPORTED AS CYTO PERI   Protein, pleural or peritoneal fluid     Status: None   Collection Time: 10/05/20  5:02 PM  Result Value Ref Range   Total protein, fluid 5.2 g/dL    Comment: (NOTE) No normal range established for this test Results should be evaluated in conjunction with serum values    Fluid Type-FTP PERITONEAL     Comment: RIGHT Performed at [place] [address] [place], [place] [address] CORRECTED ON 08/15 AT 1744: PREVIOUSLY REPORTED AS CYTO PERI   Lactic acid, plasma     Status: None   Collection Time: 10/05/20  6:34 PM  Result Value Ref Range   Lactic Acid, Venous 1.2 0.5 - 1.9 mmol/L    Comment: Performed at [facility], [place] [address], [place], [place] [address]  Ammonia     Status: None   Collection Time: 10/05/20  6:34 PM  Result Value Ref Range   Ammonia 19 9 - 35 umol/L    Comment: Performed at [facility], [place] [address] [place], [place] [address]  D-dimer, quantitative     Status: Abnormal   Collection Time: 10/05/20  6:34 PM  Result Value Ref Range   D-Dimer, Quant 3.04 (H) 0.00 - 0.50 ug/mL-FEU    Comment: (NOTE) At the manufacturer cut-off value of 0.5 g/mL FEU, this assay has a negative predictive value of 95-100%.This assay is intended for use in conjunction with a clinical pretest probability (PTP) assessment model to exclude pulmonary embolism (PE) and deep venous thrombosis (DVT) in outpatients suspected of PE or DVT. Results should be correlated with clinical presentation. Performed at [facility], [place] [address] [place], [place] [address]   Ferritin      Status: Abnormal   Collection Time: 10/05/20  6:34 PM  Result Value Ref Range   Ferritin 607 (H) 11 - 307 ng/mL    Comment: Performed at [facility], [place] [address], [place], [place] [address]  Fibrinogen     Status: Abnormal   Collection Time: 10/05/20  6:34 PM  Result Value Ref Range   Fibrinogen 674 (H) 210 - 475 mg/dL    Comment: (NOTE) Fibrinogen results may be underestimated in patients receiving thrombolytic therapy. Performed at [facility], [place] [address], [place],  [address]   Procalcitonin     Status: None   Collection Time: 10/05/20  6:34 PM  Result Value Ref Range   Procalcitonin 0.35 ng/mL    Comment:        Interpretation: PCT (Procalcitonin) <= 0.5 ng/mL: Systemic infection (sepsis) is not likely. Local bacterial infection is possible. (NOTE)       Sepsis PCT Algorithm           Lower Respiratory Tract  Infection PCT Algorithm    ----------------------------     ----------------------------         PCT < 0.25 ng/mL                PCT < 0.10 ng/mL          Strongly encourage             Strongly discourage   discontinuation of antibiotics    initiation of antibiotics    ----------------------------     -----------------------------       PCT 0.25 - 0.50 ng/mL            PCT 0.10 - 0.25 ng/mL               OR       >80% decrease in PCT            Discourage initiation of                                            antibiotics      Encourage discontinuation           of antibiotics    ----------------------------     -----------------------------         PCT >= 0.50 ng/mL              PCT 0.26 - 0.50 ng/mL               AND        <80% decrease in PCT             Encourage initiation of                                             antibiotics       Encourage continuation           of antibiotics    ----------------------------     -----------------------------        PCT >=  0.50 ng/mL                  PCT > 0.50 ng/mL               AND         increase in PCT                  Strongly encourage                                      initiation of antibiotics    Strongly encourage escalation           of antibiotics                                     -----------------------------                                           PCT <= 0.25 ng/mL  OR                                        > 80% decrease in PCT                                      Discontinue / Do not initiate                                             antibiotics  Performed at  [address] [place], [place] [address]   Troponin I (High Sensitivity)     Status: None   Collection Time: 10/05/20  6:34 PM  Result Value Ref Range   Troponin I (High Sensitivity) 11 <18 ng/L    Comment: (NOTE) Elevated high sensitivity troponin I (hsTnI) values and significant  changes across serial measurements may suggest ACS but many other  chronic and acute conditions are known to elevate hsTnI results.  Refer to the "Links" section for chest pain algorithms and additional  guidance. Performed at [facility], [place] [address], [place], [place] [address]   Urinalysis, Routine w reflex microscopic Urine, Clean Catch     Status: Abnormal   Collection Time: 10/05/20  9:19 PM  Result Value Ref Range   Color, Urine YELLOW YELLOW   APPearance CLEAR CLEAR   Specific Gravity, Urine 1.016 1.005 - 1.030   pH 5.0 5.0 - 8.0   Glucose, UA NEGATIVE NEGATIVE mg/dL   Hgb urine dipstick SMALL (A) NEGATIVE   Bilirubin Urine NEGATIVE NEGATIVE   Ketones, ur 5 (A) NEGATIVE mg/dL   Protein, ur NEGATIVE NEGATIVE mg/dL   Nitrite NEGATIVE NEGATIVE   Leukocytes,Ua TRACE (A) NEGATIVE   RBC / HPF 0-5 0 - 5 RBC/hpf   WBC, UA 0-5 0 - 5 WBC/hpf   Bacteria, UA RARE (A) NONE SEEN   Squamous Epithelial / LPF 0-5 0 - 5    Comment:  Performed at [facility], [place] [address] [place], [place] [address]  Sodium, urine, random     Status: None   Collection Time: 10/05/20  9:19 PM  Result Value Ref Range   Sodium, Ur 63 mmol/L    Comment: Performed at [facility], [place] [address], [place], [place] [address]  Creatinine, urine, random     Status: None   Collection Time: 10/05/20  9:19 PM  Result Value Ref Range   Creatinine, Urine 153.91 mg/dL    Comment: Performed at [facility], [place] [address] [place], [place] [address]  Troponin I (High Sensitivity)     Status: None   Collection Time: 10/05/20  9:19 PM  Result Value Ref Range   Troponin I (High Sensitivity) 11 <18 ng/L    Comment: (NOTE) Elevated high sensitivity troponin I (hsTnI) values and significant  changes across serial measurements may suggest ACS but many other  chronic and acute conditions are known to elevate hsTnI results.  Refer to the "Links" section for chest pain algorithms and additional  guidance. Performed at [facility], [place] [address], [place], [place] [address]   CBG monitoring, ED     Status: None  Collection Time: 10/05/20  9:43 PM  Result Value Ref Range   Glucose-Capillary 70 70 - 99 mg/dL    Comment: Glucose reference range applies only to samples taken after fasting for at least 8 hours.   CT ABDOMEN PELVIS WO CONTRAST  Result Date: 10/05/2020 CLINICAL DATA:  Abdominal distension and pain. Vomiting and diarrhea. Sepsis. EXAM: CT ABDOMEN AND PELVIS WITHOUT CONTRAST TECHNIQUE: Multidetector CT imaging of the abdomen and pelvis was performed following the standard protocol without IV contrast. COMPARISON:  None. FINDINGS: Lower chest: No acute findings. Hepatobiliary: No mass visualized on this unenhanced exam. 2 cm gallstone is noted, however there are no signs of acute cholecystitis or biliary ductal dilatation. Pancreas: No mass or inflammatory process  visualized on this unenhanced exam. Spleen:  Within normal limits in size. Adrenals/Urinary tract: No evidence of urolithiasis or hydronephrosis. Unremarkable unopacified urinary bladder. Stomach/Bowel: No evidence of obstruction, inflammatory process, or abnormal fluid collections. Diverticulosis is seen mainly involving the sigmoid colon, however there is no evidence of diverticulitis. Vascular/Lymphatic: No pathologically enlarged lymph nodes identified. No evidence of abdominal aortic aneurysm. Aortic atherosclerotic calcification noted. Reproductive:  No mass or other significant abnormality. Other: Large amount of ascites is seen throughout the abdomen and pelvis. Musculoskeletal:  No suspicious bone lesions identified. IMPRESSION: Large amount of ascites. Cholelithiasis. No radiographic evidence of acute cholecystitis. Mild sigmoid diverticulosis, without radiographic evidence of diverticulitis. Aortic Atherosclerosis (ICD10-I70.0). Electronically Signed   By: Marlaine Hind M.D.   On: 10/05/2020 13:29   US RENAL  Result Date: 10/05/2020 CLINICAL DATA:  Acute renal injury EXAM: RENAL / URINARY TRACT ULTRASOUND COMPLETE COMPARISON:  CT from earlier in the same day. FINDINGS: Right Kidney: Renal measurements: 9.4 x 4.3 x 6.5 cm. = volume: 135 mL. Echogenicity within normal limits. No mass or hydronephrosis visualized. Left Kidney: Renal measurements: 10.5 x 4.6 x 5.3 cm. = volume: 136 mL. Echogenicity within normal limits. No mass or hydronephrosis visualized. Bladder: Appears normal for degree of bladder distention. Other: Considerable ascites is noted despite recent paracentesis. IMPRESSION: Normal-appearing kidneys. Large volume ascites despite recent paracentesis. Electronically Signed   By: Inez Catalina M.D.   On: 10/05/2020 19:06   DG Chest Port 1 View  Result Date: 10/05/2020 CLINICAL DATA:  Questionable sepsis EXAM: PORTABLE CHEST 1 VIEW COMPARISON:  None. FINDINGS: The patient is rotated to the  right. The cardiomediastinal silhouette is within normal limits, allowing for low lung volumes and AP technique. Lung volumes are low. There is no focal consolidation or pulmonary edema. There is no pleural effusion or pneumothorax. There is no acute osseous abnormality. IMPRESSION: Low lung volumes. Otherwise, no radiographic evidence of acute cardiopulmonary process. Electronically Signed   By: Valetta Mole M.D.   On: 10/05/2020 12:26   US Abdomen Limited RUQ (LIVER/GB)  Result Date: 10/05/2020 CLINICAL DATA:  Right upper quadrant abdominal pain EXAM: ULTRASOUND ABDOMEN LIMITED RIGHT UPPER QUADRANT COMPARISON:  10/05/2020 CT abdomen/pelvis FINDINGS: Gallbladder: Nondistended gallbladder is completely filled with a densely shadowing 3.6 cm gallstone. No gallbladder wall thickening. No sonographic Murphy's sign. Common bile duct: Diameter: 6 mm on limited views, top-normal Liver: Coarsened liver parenchyma with questionable liver surface irregularity, which may indicate cirrhosis. No liver mass demonstrated on these limited views. Portal vein is patent on color Doppler imaging with normal direction of blood flow towards the liver. Other: Large volume ascites in the upper abdomen. IMPRESSION: 1. Cholelithiasis.  No evidence of acute cholecystitis. 2. Top normal caliber common bile duct, 6 mm diameter.  3. Coarsened liver parenchyma with questionable liver surface irregularity, which may indicate cirrhosis. No liver mass detected on these limited views. Suggest correlation with liver function tests. 4. Large volume upper abdominal ascites. Electronically Signed   By: Ilona Sorrel M.D.   On: 10/05/2020 14:37    Pending Labs Unresulted Labs (From admission, onward)    Start     Ordered   10/06/20 0500  CBC  Tomorrow morning,   R        10/05/20 1703   10/06/20 0500  Comprehensive metabolic panel  Tomorrow morning,   R        10/05/20 1703   10/06/20 0500  AFP tumor marker  Tomorrow morning,   R         10/05/20 1716   10/06/20 0500  C-reactive protein  Daily,   R      10/05/20 1724   10/06/20 0500  D-dimer, quantitative  Daily,   R      10/05/20 1724   10/06/20 0500  Ferritin  Daily,   R      10/05/20 1724   10/06/20 0500  Magnesium  Daily,   R      10/05/20 1724   10/06/20 0500  Phosphorus  Daily,   R      10/05/20 1724   10/05/20 1730  Osmolality, urine  Once,   STAT        10/05/20 1729   10/05/20 1730  Osmolality  Once,   STAT        10/05/20 1729   10/05/20 1716  Hepatitis B surface antigen  Add-on,   AD        10/05/20 1715   10/05/20 1716  Hepatitis C antibody  Add-on,   AD        10/05/20 1715   10/05/20 1716  Hepatitis B e antigen  Add-on,   AD        10/05/20 1715   10/05/20 1716  Hepatitis B core antibody, total  Add-on,   AD        10/05/20 1715   10/05/20 1705  PH, Body Fluid  RELEASE UPON ORDERING,   STAT        10/05/20 1705   10/05/20 1702  Pathologist smear review  Once,   R        10/05/20 1702   10/05/20 1702  Culture, body fluid w Gram Stain-bottle  Once,   R        10/05/20 1702   10/05/20 1702  Gram stain  Once,   R        10/05/20 1702   10/05/20 1701  HIV Antibody (routine testing w rflx)  (HIV Antibody (Routine testing w reflex) panel)  Once,   STAT        10/05/20 1703   10/05/20 1319  Body fluid culture w Gram Stain  (Peritoneal fluid analysis panel (pnl))  ONCE - STAT,   STAT       Question:  Are there also cytology or pathology orders on this specimen?  Answer:  No   10/05/20 1318   10/05/20 1139  Blood Culture (routine x 2)  (Septic presentation on arrival (screening labs, nursing and treatment orders for obvious sepsis))  BLOOD CULTURE X 2,   STAT      10/05/20 1140   10/05/20 1139  Urine Culture  (Septic presentation on arrival (screening labs, nursing and treatment orders for obvious sepsis))  ONCE - STAT,  STAT       Question:  Indication  Answer:  Sepsis   10/05/20 1140          Vitals/Pain Today's Vitals   10/05/20 1930 10/05/20  1945 10/05/20 2118 10/05/20 2120  BP: 106/71 106/74  118/82  Pulse: (!) 102 (!) 103  (!) 111  Resp: 20 17  (!) 25  Temp:      TempSrc:      SpO2: 99% 98%  98%  PainSc:   4      Isolation Precautions Airborne and Contact precautions  Medications Medications  albumin human 25 % solution 25 g (25 g Intravenous New Bag/Given 10/05/20 1731)  lidocaine (XYLOCAINE) 1 % (with pres) injection (  Not Given 10/05/20 1706)  fentaNYL (SUBLIMAZE) injection 100 mcg (0 mcg Intravenous Hold 10/05/20 1705)  norepinephrine (LEVOPHED) '4mg'$  in 2106m premix infusion (0 mcg/min Intravenous Hold 10/05/20 1707)  simvastatin (ZOCOR) tablet 20 mg (20 mg Oral Given 10/05/20 2123)  pantoprazole (PROTONIX) EC tablet 40 mg (40 mg Oral Given 10/05/20 2123)  insulin aspart (novoLOG) injection 0-9 Units (has no administration in time range)  heparin injection 5,000 Units (5,000 Units Subcutaneous Given 10/05/20 2123)  acetaminophen (TYLENOL) tablet 650 mg (has no administration in time range)    Or  acetaminophen (TYLENOL) suppository 650 mg (has no administration in time range)  oxyCODONE (Oxy IR/ROXICODONE) immediate release tablet 5 mg (has no administration in time range)  ondansetron (ZOFRAN) tablet 4 mg (has no administration in time range)    Or  ondansetron (ZOFRAN) injection 4 mg (has no administration in time range)  cefTRIAXone (ROCEPHIN) 2 g in sodium chloride 0.9 % 100 mL IVPB (has no administration in time range)  lactated ringers bolus 500 mL (0 mLs Intravenous Stopped 10/05/20 1252)  cefTRIAXone (ROCEPHIN) 2 g in sodium chloride 0.9 % 100 mL IVPB (0 g Intravenous Stopped 10/05/20 1224)  metroNIDAZOLE (FLAGYL) IVPB 500 mg (0 mg Intravenous Stopped 10/05/20 1339)  fentaNYL (SUBLIMAZE) injection 50 mcg (50 mcg Intravenous Given 10/05/20 1325)  ondansetron (ZOFRAN) injection 4 mg (4 mg Intravenous Given 10/05/20 1325)  lidocaine (PF) (XYLOCAINE) 1 % injection 5 mL (5 mLs Intradermal Given 10/05/20 1505)  sodium  zirconium cyclosilicate (LOKELMA) packet 10 g (10 g Oral Given 10/05/20 2123)    Mobility walks

## 2020-10-05 NOTE — ED Provider Notes (Signed)
   3:12 PM Patient signed out to me by previous ED physician.   Patient is a 65 yo female with no previous liver pathology dx presenting for sob, abdominal distension, and hypotension. Patient hypotensive at 83/52, given small cc fluid bolus, with diagnostic peritoneal tap by previous provider, awaiting therapeutic tap by IR.     4:34 PM I spoke with IR who has started therapeutic abdominal paracentesis.   Patient admitted.    Campbell Stall P, DO [id number] [phone]

## 2020-10-05 NOTE — H&P (Signed)
History and Physical    [name] [id number] DOB: 1955-11-22 DOA: 10/05/2020  PCP: Pcp, No (Confirm with patient/family/NH records and if not entered, this has to be entered at [facility] point of entry) Patient coming from: Home  I have personally briefly reviewed patient's old medical records in [place]  Chief Complaint: Belly hurts and distended  HPI: Beth Sanchez is a 65 y.o. female with medical history significant of HTN, IIDM, presented with worsening of abdominal pain and distention.  Patient started with develop abdominal distention and leg swelling about 1 week ago, gradually getting worse.  Before that, about 1 month ago, patient went to see her PCP, who cut down her blood pressure medication ACE inhibitor to one half regular doses due to " kidney function elevation".  Over the weekend, she also developed " soreness like abdominal pain" for her abdomen has so distended, she also feels lower back pain and fatigue.  She denied any fever chills, and yesterday she vomited 2 times of stomach content and had loose bowel movement x1.  She used to drink alcohol but quit 30 years ago.  ED Course: Blood pressure on the lower side, however denied any lightheadedness or chest pains or shortness of breath.  Abdomen distended, CT abdomen showed large amount of ascites, no signs of bowel obstruction.  RUQ ultrasound showed large amount of ascites and changes compatible with cirrhosis.  Blood work, WBC 15.2, creatinine 0.8, potassium 4.4, sodium 131.  Review of Systems: As per HPI otherwise 14 point review of systems negative.    Past Medical History:  Diagnosis Date   Hypertension    Psoriasis     History reviewed. No pertinent surgical history.   reports that she has never smoked. She has never used smokeless tobacco. She reports that she does not drink alcohol and does not use drugs.  No Known Allergies  Family History  Problem Relation Age of Onset   Stroke Mother     Heart disease Sister    Heart attack Maternal Grandfather 71   Diabetes Other    Colon cancer Other    Lung cancer Other    Cancer Other        Bladder cancer   Hypertension Other    Thyroid disease Other        Hypothyroid     Prior to Admission medications   Medication Sig Start Date End Date Taking? Authorizing Provider  simvastatin (ZOCOR) 20 MG tablet Take 20 mg by mouth daily.   Yes [provider]  empagliflozin (JARDIANCE) 10 MG TABS tablet Take 1 tablet (10 mg total) by mouth daily. 09/15/20   Ann Held, DO  lisinopril-hydrochlorothiazide (ZESTORETIC) 10-12.5 MG tablet Take 1 tablet by mouth daily. 09/10/20   Ann Held, DO  metFORMIN (GLUCOPHAGE) 500 MG tablet Take 1 tablet (500 mg total) by mouth daily with breakfast. 09/10/20   Carollee Herter, Alferd Apa, DO  omeprazole (PRILOSEC) 20 MG capsule Take 1 capsule (20 mg total) by mouth daily. 09/10/20   Ann Held, DO    Physical Exam: Vitals:   10/05/20 1500 10/05/20 1510 10/05/20 1520 10/05/20 1600  BP: 1'02/82 93/67 94/65 '$ 92/67  Pulse: (!) 108 (!) 109 (!) 110 (!) 111  Resp: 13 (!) 23 (!) 28 (!) 34  Temp:      TempSrc:      SpO2: 99% 100% 98% 97%    Constitutional: NAD, calm, comfortable Vitals:   10/05/20 1500 10/05/20 1510  10/05/20 1520 10/05/20 1600  BP: 1'02/82 93/67 94/65 '$ 92/67  Pulse: (!) 108 (!) 109 (!) 110 (!) 111  Resp: 13 (!) 23 (!) 28 (!) 34  Temp:      TempSrc:      SpO2: 99% 100% 98% 97%   Eyes: PERRL, lids and conjunctivae normal ENMT: Mucous membranes are moist. Posterior pharynx clear of any exudate or lesions.Normal dentition.  Neck: normal, supple, no masses, no thyromegaly.JVD to the 7 cm above clavicle Respiratory: clear to auscultation bilaterally, no wheezing, no crackles. Normal respiratory effort. No accessory muscle use.  Cardiovascular: Regular rate and rhythm, no murmurs / rubs / gallops.  2+ extremity edema. 2+ pedal pulses. No carotid bruits.   Abdomen: Distended, large amount of ascites, mild tenderness on periumbilical area no rebound no guarding, no masses palpated. No hepatosplenomegaly. Bowel sounds positive.  Musculoskeletal: no clubbing / cyanosis. No joint deformity upper and lower extremities. Good ROM, no contractures. Normal muscle tone.  Skin: no rashes, lesions, ulcers. No induration Neurologic: CN 2-12 grossly intact. Sensation intact, DTR normal. Strength 5/5 in all 4.  Psychiatric: Normal judgment and insight. Alert and oriented x 3. Normal mood.    Labs on Admission: I have personally reviewed following labs and imaging studies  CBC: Recent Labs  Lab 10/05/20 1136  WBC 6.6  NEUTROABS 5.9  HGB 11.6*  HCT 36.4  MCV 82.9  PLT 123456   Basic Metabolic Panel: Recent Labs  Lab 10/05/20 1136  NA 131*  K 5.2*  CL 101  CO2 18*  GLUCOSE 96  BUN 93*  CREATININE 3.34*  CALCIUM 9.0   GFR: CrCl cannot be calculated (Unknown ideal weight.). Liver Function Tests: Recent Labs  Lab 10/05/20 1136  AST 16  ALT 12  ALKPHOS 122  BILITOT 0.7  PROT 7.4  ALBUMIN 3.0*   No results for input(s): LIPASE, AMYLASE in the last 168 hours. No results for input(s): AMMONIA in the last 168 hours. Coagulation Profile: Recent Labs  Lab 10/05/20 1136  INR 1.1   Cardiac Enzymes: No results for input(s): CKTOTAL, CKMB, CKMBINDEX, TROPONINI in the last 168 hours. BNP (last 3 results) No results for input(s): PROBNP in the last 8760 hours. HbA1C: No results for input(s): HGBA1C in the last 72 hours. CBG: No results for input(s): GLUCAP in the last 168 hours. Lipid Profile: No results for input(s): CHOL, HDL, LDLCALC, TRIG, CHOLHDL, LDLDIRECT in the last 72 hours. Thyroid Function Tests: No results for input(s): TSH, T4TOTAL, FREET4, T3FREE, THYROIDAB in the last 72 hours. Anemia Panel: No results for input(s): VITAMINB12, FOLATE, FERRITIN, TIBC, IRON, RETICCTPCT in the last 72 hours. Urine analysis:    Component  Value Date/Time   BILIRUBINUR neg 06/22/2015 1147   PROTEINUR neg 06/22/2015 1147   UROBILINOGEN 1.0 06/22/2015 1147   NITRITE neg 06/22/2015 1147   LEUKOCYTESUR Negative 06/22/2015 1147    Radiological Exams on Admission: CT ABDOMEN PELVIS WO CONTRAST  Result Date: 10/05/2020 CLINICAL DATA:  Abdominal distension and pain. Vomiting and diarrhea. Sepsis. EXAM: CT ABDOMEN AND PELVIS WITHOUT CONTRAST TECHNIQUE: Multidetector CT imaging of the abdomen and pelvis was performed following the standard protocol without IV contrast. COMPARISON:  None. FINDINGS: Lower chest: No acute findings. Hepatobiliary: No mass visualized on this unenhanced exam. 2 cm gallstone is noted, however there are no signs of acute cholecystitis or biliary ductal dilatation. Pancreas: No mass or inflammatory process visualized on this unenhanced exam. Spleen:  Within normal limits in size. Adrenals/Urinary tract: No evidence of  urolithiasis or hydronephrosis. Unremarkable unopacified urinary bladder. Stomach/Bowel: No evidence of obstruction, inflammatory process, or abnormal fluid collections. Diverticulosis is seen mainly involving the sigmoid colon, however there is no evidence of diverticulitis. Vascular/Lymphatic: No pathologically enlarged lymph nodes identified. No evidence of abdominal aortic aneurysm. Aortic atherosclerotic calcification noted. Reproductive:  No mass or other significant abnormality. Other: Large amount of ascites is seen throughout the abdomen and pelvis. Musculoskeletal:  No suspicious bone lesions identified. IMPRESSION: Large amount of ascites. Cholelithiasis. No radiographic evidence of acute cholecystitis. Mild sigmoid diverticulosis, without radiographic evidence of diverticulitis. Aortic Atherosclerosis (ICD10-I70.0). Electronically Signed   By: Marlaine Hind M.D.   On: 10/05/2020 13:29   DG Chest Port 1 View  Result Date: 10/05/2020 CLINICAL DATA:  Questionable sepsis EXAM: PORTABLE CHEST 1 VIEW  COMPARISON:  None. FINDINGS: The patient is rotated to the right. The cardiomediastinal silhouette is within normal limits, allowing for low lung volumes and AP technique. Lung volumes are low. There is no focal consolidation or pulmonary edema. There is no pleural effusion or pneumothorax. There is no acute osseous abnormality. IMPRESSION: Low lung volumes. Otherwise, no radiographic evidence of acute cardiopulmonary process. Electronically Signed   By: Valetta Mole M.D.   On: 10/05/2020 12:26   US Abdomen Limited RUQ (LIVER/GB)  Result Date: 10/05/2020 CLINICAL DATA:  Right upper quadrant abdominal pain EXAM: ULTRASOUND ABDOMEN LIMITED RIGHT UPPER QUADRANT COMPARISON:  10/05/2020 CT abdomen/pelvis FINDINGS: Gallbladder: Nondistended gallbladder is completely filled with a densely shadowing 3.6 cm gallstone. No gallbladder wall thickening. No sonographic Murphy's sign. Common bile duct: Diameter: 6 mm on limited views, top-normal Liver: Coarsened liver parenchyma with questionable liver surface irregularity, which may indicate cirrhosis. No liver mass demonstrated on these limited views. Portal vein is patent on color Doppler imaging with normal direction of blood flow towards the liver. Other: Large volume ascites in the upper abdomen. IMPRESSION: 1. Cholelithiasis.  No evidence of acute cholecystitis. 2. Top normal caliber common bile duct, 6 mm diameter. 3. Coarsened liver parenchyma with questionable liver surface irregularity, which may indicate cirrhosis. No liver mass detected on these limited views. Suggest correlation with liver function tests. 4. Large volume upper abdominal ascites. Electronically Signed   By: Ilona Sorrel M.D.   On: 10/05/2020 14:37    EKG: Independently reviewed.  Chronic RBBB and nonspecific ST-T changes on multiple leads  Assessment/Plan Active Problems:   Cirrhosis of liver (HCC)   Cirrhosis ([place])  (please populate well all problems here in Problem List. (For example, if  patient is on BP meds at home and you resume or decide to hold them, it is a problem that needs to be her. Same for CAD, COPD, HLD and so on)  New onset of cirrhosis with large quantity of ascites -With signs of decompensation of cirrhosis -Unknown etiology, likely NASH versus autoimmune, consult GI. -Diagnostic paracentesis done in the ED, ordered IR guided paracentesis to relieve her symptoms this afternoon.  Able to pull out 3.5 L dark brown color cloudy ascites, sent for cytology culture.  Discussed with IR, plan for monitor kidney function and blood pressure, plan to give IV albumin x6 hours tomorrow to boost her blood pressure and optimize volume status, can have another therapeutic paracentesis on Wednesday. -Given the new onset of abdominal pain, will need to rule out SBP, continue ceftriaxone for now, until SBP ruled out by cytology and culture. -Send chronic hepatitis panel and AFP.  AKI -Significant fluid overload with ascites and peripheral edema -Suspect hepatorenal syndrome,  blood pressure too low for diuresis -FeNa and Renal U/S  Hypotension -Secondary to liver cirrhosis, lactic acid within normal limits.  Low suspicious for sepsis. -IV albumin for volume expansion, empiric ceftriaxone for SBP until ruled out. -Hold home BP meds.  Borderline hyperkalemia -Secondary to AKI, hold ACEI, 1 dose of Lokelma given.  Hyponatremia -Volume overloaded/hypervolemic -Send urine sodium level  Incidental COVID infection -Denied any breathing symptoms such as cough shortness of breath fever chills. -Patient was vaccinated x2 and posterior x2, send COVID labs, will monitor off antibiotic treatment.   IIDM -Discontinue Jardiance and metformin due to AKI, start sliding scale.   DVT prophylaxis: Heparin subcu Code Status: Full code Family Communication: None at bedside Disposition Plan: Expect more than 2 midnight hospital stay to treat new onset cirrhosis and large  ascites. Consults called: Paged Dr. Collene Mares Admission status: Tele admit   Lequita Halt MD [facility] Pager [phone]    10/05/2020, 5:08 PM

## 2020-10-05 NOTE — ED Notes (Signed)
Provider notified regarding pt's VS.

## 2020-10-05 NOTE — ED Notes (Addendum)
This nurse spoke with Hospitalist, Dr. Roosevelt Locks, as well as Campbell Stall, DO., taking over for Dr. Matilde Sprang. Paracentesis procedure ended at this time after 3.5L drained from pt's abdomen. Pt maintaining a MAP of 83, with Hospitalist at the bedside witnessing. Pt not complaining of pain at this time. Ordered Levophed held, per hospitalist's verbal order at the bedside. 140mg Fentanyl held at this time as well as the pt is not c/o pain. Will start Albumin as ordered, per Hospitalist's order.

## 2020-10-05 NOTE — Progress Notes (Signed)
Elink following Code Sepsis bundle. ?

## 2020-10-05 NOTE — ED Notes (Signed)
Verified with hospitalist pt's Albumin dosage of '25mg'$  in 152m's.

## 2020-10-06 ENCOUNTER — Other Ambulatory Visit: Payer: Self-pay

## 2020-10-06 ENCOUNTER — Telehealth: Payer: Self-pay

## 2020-10-06 ENCOUNTER — Inpatient Hospital Stay (HOSPITAL_COMMUNITY): Payer: Medicare Other

## 2020-10-06 DIAGNOSIS — R188 Other ascites: Secondary | ICD-10-CM

## 2020-10-06 DIAGNOSIS — E871 Hypo-osmolality and hyponatremia: Secondary | ICD-10-CM

## 2020-10-06 DIAGNOSIS — R7989 Other specified abnormal findings of blood chemistry: Secondary | ICD-10-CM | POA: Diagnosis not present

## 2020-10-06 DIAGNOSIS — K7460 Unspecified cirrhosis of liver: Secondary | ICD-10-CM

## 2020-10-06 DIAGNOSIS — I361 Nonrheumatic tricuspid (valve) insufficiency: Secondary | ICD-10-CM

## 2020-10-06 DIAGNOSIS — E119 Type 2 diabetes mellitus without complications: Secondary | ICD-10-CM

## 2020-10-06 DIAGNOSIS — U071 COVID-19: Secondary | ICD-10-CM | POA: Diagnosis not present

## 2020-10-06 DIAGNOSIS — K8000 Calculus of gallbladder with acute cholecystitis without obstruction: Secondary | ICD-10-CM

## 2020-10-06 DIAGNOSIS — E875 Hyperkalemia: Secondary | ICD-10-CM

## 2020-10-06 DIAGNOSIS — N179 Acute kidney failure, unspecified: Secondary | ICD-10-CM

## 2020-10-06 DIAGNOSIS — E872 Acidosis: Secondary | ICD-10-CM

## 2020-10-06 DIAGNOSIS — I959 Hypotension, unspecified: Secondary | ICD-10-CM

## 2020-10-06 LAB — FERRITIN: Ferritin: 511 ng/mL — ABNORMAL HIGH (ref 11–307)

## 2020-10-06 LAB — HEPATITIS C ANTIBODY: HCV Ab: NONREACTIVE

## 2020-10-06 LAB — PHOSPHORUS: Phosphorus: 4.6 mg/dL (ref 2.5–4.6)

## 2020-10-06 LAB — COMPREHENSIVE METABOLIC PANEL
ALT: 10 U/L (ref 0–44)
AST: 13 U/L — ABNORMAL LOW (ref 15–41)
Albumin: 3.2 g/dL — ABNORMAL LOW (ref 3.5–5.0)
Alkaline Phosphatase: 93 U/L (ref 38–126)
Anion gap: 12 (ref 5–15)
BUN: 77 mg/dL — ABNORMAL HIGH (ref 8–23)
CO2: 18 mmol/L — ABNORMAL LOW (ref 22–32)
Calcium: 9 mg/dL (ref 8.9–10.3)
Chloride: 103 mmol/L (ref 98–111)
Creatinine, Ser: 2.28 mg/dL — ABNORMAL HIGH (ref 0.44–1.00)
GFR, Estimated: 23 mL/min — ABNORMAL LOW (ref >60)
Glucose, Bld: 71 mg/dL (ref 70–99)
Potassium: 5.4 mmol/L — ABNORMAL HIGH (ref 3.5–5.1)
Sodium: 133 mmol/L — ABNORMAL LOW (ref 135–145)
Total Bilirubin: 0.8 mg/dL (ref 0.3–1.2)
Total Protein: 6.5 g/dL (ref 6.5–8.1)

## 2020-10-06 LAB — ECHOCARDIOGRAM COMPLETE
Area-P 1/2: 4.31 cm2
Height: 67 in
S' Lateral: 2.3 cm
Weight: 3093.49 oz

## 2020-10-06 LAB — HIV ANTIBODY (ROUTINE TESTING W REFLEX): HIV Screen 4th Generation wRfx: NONREACTIVE

## 2020-10-06 LAB — PH, BODY FLUID: pH, Body Fluid: 7.3

## 2020-10-06 LAB — CBC
HCT: 32.2 % — ABNORMAL LOW (ref 36.0–46.0)
Hemoglobin: 10.1 g/dL — ABNORMAL LOW (ref 12.0–15.0)
MCH: 26.4 pg (ref 26.0–34.0)
MCHC: 31.4 g/dL (ref 30.0–36.0)
MCV: 84.3 fL (ref 80.0–100.0)
Platelets: 281 10*3/uL (ref 150–400)
RBC: 3.82 MIL/uL — ABNORMAL LOW (ref 3.87–5.11)
RDW: 16.1 % — ABNORMAL HIGH (ref 11.5–15.5)
WBC: 6.6 10*3/uL (ref 4.0–10.5)
nRBC: 0 % (ref 0.0–0.2)

## 2020-10-06 LAB — GLUCOSE, CAPILLARY
Glucose-Capillary: 87 mg/dL (ref 70–99)
Glucose-Capillary: 80 mg/dL (ref 70–99)
Glucose-Capillary: 83 mg/dL (ref 70–99)
Glucose-Capillary: 83 mg/dL (ref 70–99)

## 2020-10-06 LAB — C-REACTIVE PROTEIN: CRP: 12.3 mg/dL — ABNORMAL HIGH (ref <1.0)

## 2020-10-06 LAB — GRAM STAIN

## 2020-10-06 LAB — OSMOLALITY: Osmolality: 311 mOsm/kg — ABNORMAL HIGH (ref 275–295)

## 2020-10-06 LAB — HEPATITIS B SURFACE ANTIGEN: Hepatitis B Surface Ag: NONREACTIVE

## 2020-10-06 LAB — PROTEIN / CREATININE RATIO, URINE
Creatinine, Urine: 111.11 mg/dL
Protein Creatinine Ratio: 0.39 mg/mg{Cre} — ABNORMAL HIGH (ref 0.00–0.15)
Total Protein, Urine: 43 mg/dL

## 2020-10-06 LAB — HEPATITIS B CORE ANTIBODY, TOTAL: Hep B Core Total Ab: NONREACTIVE

## 2020-10-06 LAB — D-DIMER, QUANTITATIVE: D-Dimer, Quant: 2.86 ug/mL-FEU — ABNORMAL HIGH (ref 0.00–0.50)

## 2020-10-06 LAB — LIPASE, BLOOD: Lipase: 31 U/L (ref 11–51)

## 2020-10-06 LAB — MAGNESIUM: Magnesium: 2.1 mg/dL (ref 1.7–2.4)

## 2020-10-06 LAB — OSMOLALITY, URINE: Osmolality, Ur: 512 mOsm/kg (ref 300–900)

## 2020-10-06 MED ORDER — SODIUM ZIRCONIUM CYCLOSILICATE 10 G PO PACK
10.0000 g | PACK | Freq: Once | ORAL | Status: AC
  Administered 2020-10-06: 10 g via ORAL
  Filled 2020-10-06: qty 1

## 2020-10-06 NOTE — Progress Notes (Signed)
  Echocardiogram 2D Echocardiogram has been performed.  Shawana Knoch G Cheryl Stabenow 10/06/2020, 1:29 PM

## 2020-10-06 NOTE — Telephone Encounter (Signed)
Sister calling to schedule an new patient appt.

## 2020-10-06 NOTE — Consult Note (Addendum)
Referring Provider: Dr. Wendee Beavers  Primary Care Physician:  Dr. [facility]  Primary Gastroenterologist:  Althia Forts  Reason for Consultation:  Cirrhosis   HPI: Beth Sanchez is a 65 y.o. female with a past medical history of psoriasis, hypertension and diabetes mellitus type 2.  She developed N/V/D with abdominal distention and generalized abdominal pain with back pain and myalgias which worsened over the past week so she presented to [name] ED to for further evaluation.  She was tachycardic and hypotensive in the ED.  A twelve-lead EKG showed a right bundle branch block and left anterior fascicular block unchanged from previous with new tachycardia.  A chest x-ray was negative for infiltrates or pneumonia.  Labs in the ED showed a sodium level 131.  Potassium 5.2.  Glucose 96.  BUN 93.  Creatinine 3.34  (Cr  1.32 three weeks ago).  Calcium 9.0.  Anion gap 12.  Alk phos 122.  Total bili 0.7.  AST 16.  ALT 12.  Albumin 3.0.  Lactic acid 1.4.  WBC 6.6.  Hemoglobin 11.6.  Hematocrit 32.9.  Platelet 375.  INR 1.1.  SARS coronavirus 2 positive.  Abdominal/pelvic CT without contrast showed a 2 cm gallstone without signs of acute cholecystitis or biliary ductal dilatation and no evidence of cirrhosis or hepatoma.  A large amount of ascites was present with evidence of mild sigmoid diverticulosis without evidence of diverticulitis.  RUQ showed a coarsened liver parenchyma with questionable liver surface irregularity which could indicate cirrhosis without evidence of a liver mass, large volume of ascites and cholelithiasis without evidence of cholecystitis.  No evidence of CBD dilatation. A renal ultrasound showed normal-appearing kidneys with a large volume ascites.  A diagnostic/therapeutic paracentesis was completed on 8/15 and of 3.5L of hazy/amber/blood tinged peritoneal fluid was removed without evidence of SBP.  SAAG level 0.4 which is consistent with  infection/inflammatory process or malignancy and less suggestive of cirrhosis/portal HTN. Peritoneal protein level elevated at 5.2 which suggests possible nephrotic syndrome or heart failure. She will need a repeat paracentesis in a few days with peritoneal fluid labs to include cytology.   She complains of having a decreased appetite with associated 40 pound weight loss since March 2022.  No fever, sweats or chills.  She denies having any nausea or vomiting.  No dysphagia or heartburn.  She developed abdominal distention approximately 1 month ago which has progressively worsened with the onset of generalized abdominal pain and lower back pain for the past few weeks.  She developed swelling to her legs about 1 week ago.  She stated for the past few months she just could not eat, felt full easily.  She started taking Omeprazole 74m QD about 3 weeks ago. She was taking Aleve 1 tab daily 2 to 3 days weekly then switch to extra strength Tylenol 2-3 tabs daily for the past 2 weeks.  No history of alcohol use disorder.  No alcohol for at least the past 30 years.  No history of drug use.  She denies ever having an EGD or screening colonoscopy.  She was seen by Dr. LLawson Radarabout a month ago and her blood pressure was running lower, her creatinine level was slightly elevated at 1.32 and  she reported having looser stools so her Lisinopril-Hydrochlorothiazide and Metformin doses were reduced by half and she was started on Jardiance 11mQD on 09/14/2020.  She denies ever taking any antibiotics within the past 6 months.  No prior  history of kidney disease.  CTAP without contrast 10/05/2020: Large amount of ascites.  Cholelithiasis. No radiographic evidence of acute cholecystitis.  Mild sigmoid diverticulosis, without radiographic evidence of diverticulitis.  Aortic Atherosclerosis   RUQ sonogram.  50 cc today./2022: 1. Cholelithiasis.  No evidence of acute cholecystitis. 2. Top normal caliber common bile duct,  6 mm diameter. 3. Coarsened liver parenchyma with questionable liver surface irregularity, which may indicate cirrhosis. No liver mass detected on these limited views. Suggest correlation with liver function tests. 4. Large volume upper abdominal ascites.  Past Medical History:  Diagnosis Date   Hypertension    Psoriasis     History reviewed. No pertinent surgical history.  Prior to Admission medications   Medication Sig Start Date End Date Taking? Authorizing Provider  acetaminophen (TYLENOL) 500 MG tablet Take 500 mg by mouth every 6 (six) hours as needed for moderate pain.   Yes [provider]  empagliflozin (JARDIANCE) 10 MG TABS tablet Take 1 tablet (10 mg total) by mouth daily. 09/15/20  Yes Roma Schanz R, DO  lisinopril-hydrochlorothiazide (ZESTORETIC) 10-12.5 MG tablet Take 1 tablet by mouth daily. 09/10/20  Yes Roma Schanz R, DO  metFORMIN (GLUCOPHAGE) 500 MG tablet Take 1 tablet (500 mg total) by mouth daily with breakfast. 09/10/20  Yes Carollee Herter, Alferd Apa, DO  omeprazole (PRILOSEC) 20 MG capsule Take 1 capsule (20 mg total) by mouth daily. 09/10/20  Yes Roma Schanz R, DO  simvastatin (ZOCOR) 20 MG tablet Take 20 mg by mouth daily.   Yes [provider]    Current Facility-Administered Medications  Medication Dose Route Frequency Provider Last Rate Last Admin   acetaminophen (TYLENOL) tablet 650 mg  650 mg Oral Q6H PRN Wynetta Fines T, MD       Or   acetaminophen (TYLENOL) suppository 650 mg  650 mg Rectal Q6H PRN Wynetta Fines T, MD       albumin human 25 % solution 25 g  25 g Intravenous Q6H Wynetta Fines T, MD 60 mL/hr at 10/06/20 0621 25 g at 10/06/20 0621   cefTRIAXone (ROCEPHIN) 2 g in sodium chloride 0.9 % 100 mL IVPB  2 g Intravenous Q24H Wynetta Fines T, MD       heparin injection 5,000 Units  5,000 Units Subcutaneous Q12H Wynetta Fines T, MD   5,000 Units at 10/06/20 2993   insulin aspart (novoLOG) injection 0-9 Units  0-9 Units  Subcutaneous TID WC Lequita Halt, MD       ondansetron [facility]) tablet 4 mg  4 mg Oral Q6H PRN Wynetta Fines T, MD       Or   ondansetron [facility]) injection 4 mg  4 mg Intravenous Q6H PRN Wynetta Fines T, MD       oxyCODONE (Oxy IR/ROXICODONE) immediate release tablet 5 mg  5 mg Oral Q4H PRN Wynetta Fines T, MD       pantoprazole (PROTONIX) EC tablet 40 mg  40 mg Oral Daily Wynetta Fines T, MD   40 mg at 10/06/20 7169   simvastatin (ZOCOR) tablet 20 mg  20 mg Oral QHS Wynetta Fines T, MD   20 mg at 10/05/20 2123    Allergies as of 10/05/2020   (No Known Allergies)    Family History  Problem Relation Age of Onset   Stroke Mother    Heart disease Sister    Heart attack Maternal Grandfather 71   Diabetes Other    Colon cancer Other  Lung cancer Other    Cancer Other        Bladder cancer   Hypertension Other    Thyroid disease Other        Hypothyroid    Social History   Socioeconomic History   Marital status: Widowed    Spouse name: Not on file   Number of children: Not on file   Years of education: Not on file   Highest education level: Not on file  Occupational History   Not on file  Tobacco Use   Smoking status: Never   Smokeless tobacco: Never  Substance and Sexual Activity   Alcohol use: No   Drug use: No   Sexual activity: Not on file  Other Topics Concern   Not on file  Social History Narrative   Not on file   Social Determinants of Health   Financial Resource Strain: Not on file  Food Insecurity: Not on file  Transportation Needs: Not on file  Physical Activity: Not on file  Stress: Not on file  Social Connections: Not on file  Intimate Partner Violence: Not on file    Review of Systems: See HPI, all other systems reviewed and are negative  Physical Exam: Vital signs in last 24 hours: Temp:  [97.6 F (36.4 C)-98 F (36.7 C)] 97.7 F (36.5 C) (08/16 0407) Pulse Rate:  [94-111] 98 (08/16 1034) Resp:  [10-45] 18 (08/16 1034) BP: (78-118)/(47-82)  94/71 (08/16 1034) SpO2:  [97 %-100 %] 99 % (08/16 0407) Weight:  [87.7 kg] 87.7 kg (08/16 0000) Last BM Date: 10/05/20 General:  Alert 65 year old female in no acute distress. Head:  Normocephalic and atraumatic. Eyes:  No scleral icterus. Conjunctiva pink. Ears:  Normal auditory acuity. Nose:  No deformity, discharge or lesions. Mouth: Absent dentition.  No ulcers or lesions.  Neck:  Supple. No lymphadenopathy or thyromegaly.  Lungs: Breath sounds diminished in the bases otherwise clear. Heart: Regular rate and rhythm, no murmurs. Abdomen: Upper abdomen distended with ascites, firm but not completely tense.  Nontender.  No hepatosplenomegaly appreciated in distended abdomen with a large amount of ascites.  No palpable mass. Rectal: Deferred. Musculoskeletal:  Symmetrical without gross deformities.  Pulses:  Normal pulses noted. Extremities: Bilateral lower extremities with mild edema. Neurologic:  Alert and  oriented x4. No focal deficits.  Skin:  Intact without significant lesions or rashes.  No jaundice. Psych:  Alert and cooperative. Normal mood and affect.  Intake/Output from previous day: 08/15 0701 - 08/16 0700 In: 913.3 [IV Piggyback:913.3] Out: -  Intake/Output this shift: No intake/output data recorded.  Lab Results: Recent Labs    10/05/20 1136 10/06/20 0339  WBC 6.6 6.6  HGB 11.6* 10.1*  HCT 36.4 32.2*  PLT 375 281   BMET Recent Labs    10/05/20 1136 10/06/20 0339  NA 131* 133*  K 5.2* 5.4*  CL 101 103  CO2 18* 18*  GLUCOSE 96 71  BUN 93* 77*  CREATININE 3.34* 2.28*  CALCIUM 9.0 9.0   LFT Recent Labs    10/06/20 0339  PROT 6.5  ALBUMIN 3.2*  AST 13*  ALT 10  ALKPHOS 93  BILITOT 0.8   PT/INR Recent Labs    10/05/20 1136  LABPROT 13.9  INR 1.1   Hepatitis Panel No results for input(s): HEPBSAG, HCVAB, HEPAIGM, HEPBIGM in the last 72 hours.    Studies/Results: CT ABDOMEN PELVIS WO CONTRAST  Result Date: 10/05/2020 CLINICAL DATA:   Abdominal distension and pain. Vomiting and diarrhea.  Sepsis. EXAM: CT ABDOMEN AND PELVIS WITHOUT CONTRAST TECHNIQUE: Multidetector CT imaging of the abdomen and pelvis was performed following the standard protocol without IV contrast. COMPARISON:  None. FINDINGS: Lower chest: No acute findings. Hepatobiliary: No mass visualized on this unenhanced exam. 2 cm gallstone is noted, however there are no signs of acute cholecystitis or biliary ductal dilatation. Pancreas: No mass or inflammatory process visualized on this unenhanced exam. Spleen:  Within normal limits in size. Adrenals/Urinary tract: No evidence of urolithiasis or hydronephrosis. Unremarkable unopacified urinary bladder. Stomach/Bowel: No evidence of obstruction, inflammatory process, or abnormal fluid collections. Diverticulosis is seen mainly involving the sigmoid colon, however there is no evidence of diverticulitis. Vascular/Lymphatic: No pathologically enlarged lymph nodes identified. No evidence of abdominal aortic aneurysm. Aortic atherosclerotic calcification noted. Reproductive:  No mass or other significant abnormality. Other: Large amount of ascites is seen throughout the abdomen and pelvis. Musculoskeletal:  No suspicious bone lesions identified. IMPRESSION: Large amount of ascites. Cholelithiasis. No radiographic evidence of acute cholecystitis. Mild sigmoid diverticulosis, without radiographic evidence of diverticulitis. Aortic Atherosclerosis (ICD10-I70.0). Electronically Signed   By: Marlaine Hind M.D.   On: 10/05/2020 13:29   US RENAL  Result Date: 10/06/2020 CLINICAL DATA:  Acute renal injury EXAM: RENAL / URINARY TRACT ULTRASOUND COMPLETE COMPARISON:  CT from earlier in the same day. FINDINGS: Right Kidney: Renal measurements: 9.4 x 4.3 x 6.5 cm. = volume: 135 mL. Echogenicity within normal limits. No mass or hydronephrosis visualized. Left Kidney: Renal measurements: 10.5 x 4.6 x 5.3 cm. = volume: 136 mL. Echogenicity within normal  limits. No mass or hydronephrosis visualized. Bladder: Appears normal for degree of bladder distention. Other: Considerable ascites is noted despite recent paracentesis. IMPRESSION: Normal-appearing kidneys. Large volume ascites despite recent paracentesis. Electronically Signed   By: Inez Catalina M.D.   On: 10/05/2020 19:06   US Paracentesis  Result Date: 10/06/2020 CLINICAL DATA:  Acute renal injury EXAM: RENAL / URINARY TRACT ULTRASOUND COMPLETE COMPARISON:  CT from earlier in the same day. FINDINGS: Right Kidney: Renal measurements: 9.4 x 4.3 x 6.5 cm. = volume: 135 mL. Echogenicity within normal limits. No mass or hydronephrosis visualized. Left Kidney: Renal measurements: 10.5 x 4.6 x 5.3 cm. = volume: 136 mL. Echogenicity within normal limits. No mass or hydronephrosis visualized. Bladder: Appears normal for degree of bladder distention. Other: Considerable ascites is noted despite recent paracentesis. IMPRESSION: Normal-appearing kidneys. Large volume ascites despite recent paracentesis. Electronically Signed   By: Inez Catalina M.D.   On: 10/05/2020 19:06   DG Chest Port 1 View  Result Date: 10/05/2020 CLINICAL DATA:  Questionable sepsis EXAM: PORTABLE CHEST 1 VIEW COMPARISON:  None. FINDINGS: The patient is rotated to the right. The cardiomediastinal silhouette is within normal limits, allowing for low lung volumes and AP technique. Lung volumes are low. There is no focal consolidation or pulmonary edema. There is no pleural effusion or pneumothorax. There is no acute osseous abnormality. IMPRESSION: Low lung volumes. Otherwise, no radiographic evidence of acute cardiopulmonary process. Electronically Signed   By: Valetta Mole M.D.   On: 10/05/2020 12:26   US Abdomen Limited RUQ (LIVER/GB)  Result Date: 10/05/2020 CLINICAL DATA:  Right upper quadrant abdominal pain EXAM: ULTRASOUND ABDOMEN LIMITED RIGHT UPPER QUADRANT COMPARISON:  10/05/2020 CT abdomen/pelvis FINDINGS: Gallbladder: Nondistended  gallbladder is completely filled with a densely shadowing 3.6 cm gallstone. No gallbladder wall thickening. No sonographic Murphy's sign. Common bile duct: Diameter: 6 mm on limited views, top-normal Liver: Coarsened liver parenchyma with questionable liver surface  irregularity, which may indicate cirrhosis. No liver mass demonstrated on these limited views. Portal vein is patent on color Doppler imaging with normal direction of blood flow towards the liver. Other: Large volume ascites in the upper abdomen. IMPRESSION: 1. Cholelithiasis.  No evidence of acute cholecystitis. 2. Top normal caliber common bile duct, 6 mm diameter. 3. Coarsened liver parenchyma with questionable liver surface irregularity, which may indicate cirrhosis. No liver mass detected on these limited views. Suggest correlation with liver function tests. 4. Large volume upper abdominal ascites. Electronically Signed   By: Ilona Sorrel M.D.   On: 10/05/2020 14:37    IMPRESSION/PLAN:  54) 65 year old female with new onset abdominal distention CT and RUQ imaging identified a large amount of ascites.  RUQ showed coarsened liver parenchyma with questionable liver service irregularity which could indicate cirrhosis.  Normal LFTs.  Normal platelet count.  A diagnostic paracentesis done yesterday removed 3.5 L peritoneal fluid, no evidence of SBP. SAAG was 0.4 which is not consistent with cirrhosis/portal hypertension and peritoneal protein level was elevated at 5.2 which is more consistent with nephrotic syndrome or CHF.  Alk phos 93.  AST 13.  ALT 10.  Total bili 0.8.  Albumin 3.2.  Hepatitis B surface antigen negative.  Hep C antibody negative.  AFP pending. -Agree with IV Albumin 25 Gm IV Q 6 hrs -Eventual repeat paracentesis when renal status stable. Peritoneal fluid to include cytology with next paracentesis. -ECHO to rule out right sided heart failure  -Hepatic panel, BMP and CBC in am -? Discontinue Ceftriaxone, paracentesis yesterday  without evidence of SBP but peritoneal LDH elevated 497. -? Liver doppler  -Check IgG, ANA, SMA, AMA, ceruloplasmin, A1AT, iron and ferritin level in am -Await further recommendations per Dr. Lyndel Safe  2) AKI. Renal sodium level 63, unlikely HRS. Unlikely nephrotic syndrome, urine protein negative. Possible due to hypotension, dehydration +/- Jardiance.  -Renal consult if not already requested   3) Covid 19 +, asymptomatic  4) DM II  5) Malnutrition, weight loss       Noralyn Pick  10/06/2020, 10:47 AM    Attending physician's note   I have taken an interval history, reviewed the chart and examined the patient. I agree with the Advanced Practitioner's note, impression and recommendations.   New onset massive ascites. ?etiology. SAAG<1.1 (hence not d/t portal hypertension). TP 5.2, no SBP,  -elevated LDH with Nl pH and low neutrophils (so no bowel perf). Highly s/o malignant ascites. R/O unusual causes (like TB).  The ascitic fluid was reported as cloudy. -Nl 2DE (No R heart failure) -Although US showing "mild liver irregularity", I doubt if she has cirrhosis.  Nl spleen on CT, Nl platelets, normal PT INR  ARF ?Etiology. Nl UNa  Asymptomatic COVID-19  Unexplained weight loss  Plan: -Send ascitic fluid for cytology, TG, CEA, glucose, amylase -Would also send ascitic fluid for adenosine deaminase, mycobacteria culture -Check CA 19-9, CEA, CA125, BNP in AM -Recommend nephrology consultation. -I have reviewed CT Abdo/pelvis myself.  Will review with radiology in AM.  If radiology feels strongly that she has underlying liver cirrhosis, then would consider hepatic venous pressure gradient (HVPG) determination and transjugular liver Bx at the same time.    Carmell Austria, MD [facility] [phone]

## 2020-10-06 NOTE — Progress Notes (Signed)
PROGRESS NOTE  Beth Sanchez [id number] DOB: July 27, 1955   PCP: Pcp, No  Patient is from: Home.  DOA: 10/05/2020 LOS: 1  Chief complaints:  Chief Complaint  Patient presents with   Abdominal Pain    Vomiting, diarrhea     Brief Narrative / Interim history: 65 year old F with PMH of HTN, IDDM-2 and obesity presenting with increasing abdominal pain, distention, lower back pain, emesis and fatigue, and found to have liver cirrhosis with ascites, AKI, hypotension, mild hyponatremia, and COVID-19 infection.  She had IR paracentesis with removal of 3.5 L.  Had leukocytosis to 15.2.  Started on IV ceftriaxone until SBP is excluded.  Cr 3.34.  She had no respiratory symptoms in regards to a COVID-19 infection.  GI consulted.  Patient never had colonoscopy.  No history of GI bleed.  Quit drinking about 30 years ago.  Subjective: Seen and examined earlier this morning.  No major events overnight of this morning.  Feels much better.  Denies chest pain, dyspnea, nausea, vomiting or diarrhea.  Some abdominal pain but bearable.  Denies UTI symptoms.  Family history of lung cancer in her mother and unknown type of cancer in her father.  Objective: Vitals:   10/06/20 0000 10/06/20 0407 10/06/20 1033 10/06/20 1034  BP:  96/69 (!) 78/47 94/71  Pulse:  100  98  Resp:  16  18  Temp:  97.7 F (36.5 C)    TempSrc:  Oral    SpO2:  99%    Weight: 87.7 kg     Height: '5\' 7"'$  (1.702 m)       Intake/Output Summary (Last 24 hours) at 10/06/2020 1122 Last data filed at 10/06/2020 0800 Gross per 24 hour  Intake 913.33 ml  Output --  Net 913.33 ml   Filed Weights   10/06/20 0000  Weight: 87.7 kg    Examination:  GENERAL: No apparent distress.  Nontoxic. HEENT: MMM.  Vision and hearing grossly intact.  NECK: Supple.  No apparent JVD.  RESP:  No IWOB.  Fair aeration bilaterally. CVS:  RRR. Heart sounds normal.  ABD/GI/GU: BS+.  Abdomen slightly distended and full.  Nontender. MSK/EXT:  Moves  extremities. No apparent deformity.  Trace edema bilaterally. SKIN: no apparent skin lesion or wound NEURO: Awake, alert and oriented appropriately.  No apparent focal neuro deficit. PSYCH: Calm. Normal affect.   Procedures:  8/15-paracentesis with removal of 3.5 L  Microbiology summarized: 8/15-COVID-19 PCR positive. 8/15-blood cultures pending. 8/15-urine culture pending. 8/15-peritoneal fluid culture NGTD.  Assessment & Plan: New onset liver cirrhosis with large ascites s/p paracentesis with removal of 3.5 L.  SAAG <1.1 arguing against portal hypertension.  PMN low arguing against SBP.  Peritoneal fluid culture NGTD.  Leukocytosis resolved.  She has no significant tenderness either.  Peritoneal fluid LDH elevated.  Patient never had colonoscopy.  Quit drinking about 30 years ago.  History of lung cancer in her mother and unknown type of cancer in her father.  -[facility] consulted on admission but suggested consulting [facility] -[facility] consulted  and seen patient -Continue ceftriaxone for now -May need diuretics once blood pressure and renal function improves. -Repeat paracentesis once renal function improves -Follow further recommendation by GI   AKI/azotemia-likely prerenal from hypotension, Zestoretic, Jardiance, possible abdominal compartment syndrome, GI loss. FENa 0.9% suggesting prerenal etiology.  UA without protein making nephrotic syndrome less likely.  Renal US without significant finding.  Creatinine improved. Recent Labs    02/06/20 0906 09/10/20 0941 10/05/20 1136 10/06/20  0339  BUN 19 38* 93* 77*  CREATININE 0.84 1.32* 3.34* 2.28*  -Monitor intake and output -Avoid nephrotoxic meds -Check UPC -Nephrology consult if worse or no further improvement   Hypotension: Likely in the setting of cirrhosis with ascites and dehydration from GI loss.  Low suspicion for SBP based on current data. -IV albumin as needed for volume expansion -Hold home antihypertensive  meds. -Continue IV ceftriaxone for now  Mild hyperkalemia: In the setting of AKI. -P.o. Lokelma 10 g x 1 -Recheck in the morning   Hyponatremia-hypervolemic.  Could also be due to Zestoretic.  Improving. -Hold home Zestoretic -Continue monitoring  Non-anion gap metabolic acidosis-likely from AKI. -Continue monitoring   Incidental COVID infection: Tested positive on 8/15.  Vaccinated.  No cardiopulmonary symptoms. -No indication for medications. -Isolation precaution for a total of 10 days   NIDDM-2: On Jardiance and metformin at home. Recent Labs  Lab 10/05/20 2143 10/06/20 0800 10/06/20 1151  GLUCAP 70 87 80  -Continue holding Jardiance and metformin in the setting of AKI -Continue SSI.  May discontinue this if CBG remains within fair range -Check hemoglobin A1c  Elevated D-dimer: Likely due to COVID infection versus VTE.  Has no cardiopulmonary symptoms. -Continue VTE prophylaxis   Class I obesity Body mass index is 30.28 kg/m.  -Encourage lifestyle change to lose weight.       DVT prophylaxis:  heparin injection 5,000 Units Start: 10/05/20 2200  Code Status: Full code Family Communication: Patient and/or RN. Available if any question.  Level of care: Telemetry Status is: Inpatient  Remains inpatient appropriate because:Hemodynamically unstable, Persistent severe electrolyte disturbances, Ongoing diagnostic testing needed not appropriate for outpatient work up, IV treatments appropriate due to intensity of illness or inability to take PO, and Inpatient level of care appropriate due to severity of illness  Dispo: The patient is from: Home              Anticipated d/c is to: Home              Patient currently is not medically stable to d/c.   Difficult to place patient No       Consultants:  Gastroenterology Interventional radiology   Sch Meds:  Scheduled Meds:  heparin  5,000 Units Subcutaneous Q12H   insulin aspart  0-9 Units Subcutaneous TID WC    pantoprazole  40 mg Oral Daily   simvastatin  20 mg Oral QHS   Continuous Infusions:  albumin human 25 g (10/06/20 0621)   cefTRIAXone (ROCEPHIN)  IV     PRN Meds:.acetaminophen **OR** acetaminophen, ondansetron **OR** ondansetron (ZOFRAN) IV, oxyCODONE  Antimicrobials: Anti-infectives (From admission, onward)    Start     Dose/Rate Route Frequency Ordered Stop   10/06/20 1200  cefTRIAXone (ROCEPHIN) 2 g in sodium chloride 0.9 % 100 mL IVPB        2 g 200 mL/hr over 30 Minutes Intravenous Every 24 hours 10/05/20 1718     10/05/20 1145  cefTRIAXone (ROCEPHIN) 2 g in sodium chloride 0.9 % 100 mL IVPB        2 g 200 mL/hr over 30 Minutes Intravenous  Once 10/05/20 1140 10/05/20 1224   10/05/20 1145  metroNIDAZOLE (FLAGYL) IVPB 500 mg        500 mg 100 mL/hr over 60 Minutes Intravenous  Once 10/05/20 1140 10/05/20 1339        I have personally reviewed the following labs and images: CBC: Recent Labs  Lab 10/05/20 1136 10/06/20 0339  WBC 6.6 6.6  NEUTROABS 5.9  --   HGB 11.6* 10.1*  HCT 36.4 32.2*  MCV 82.9 84.3  PLT 375 281   BMP &GFR Recent Labs  Lab 10/05/20 1136 10/06/20 0339  NA 131* 133*  K 5.2* 5.4*  CL 101 103  CO2 18* 18*  GLUCOSE 96 71  BUN 93* 77*  CREATININE 3.34* 2.28*  CALCIUM 9.0 9.0  MG  --  2.1  PHOS  --  4.6   Estimated Creatinine Clearance: 28 mL/min (A) (by C-G formula based on SCr of 2.28 mg/dL (H)). Liver & Pancreas: Recent Labs  Lab 10/05/20 1136 10/06/20 0339  AST 16 13*  ALT 12 10  ALKPHOS 122 93  BILITOT 0.7 0.8  PROT 7.4 6.5  ALBUMIN 3.0* 3.2*   No results for input(s): LIPASE, AMYLASE in the last 168 hours. Recent Labs  Lab 10/05/20 1834  AMMONIA 19   Diabetic: No results for input(s): HGBA1C in the last 72 hours. Recent Labs  Lab 10/05/20 2143 10/06/20 0800  GLUCAP 70 87   Cardiac Enzymes: No results for input(s): CKTOTAL, CKMB, CKMBINDEX, TROPONINI in the last 168 hours. No results for input(s): PROBNP in  the last 8760 hours. Coagulation Profile: Recent Labs  Lab 10/05/20 1136  INR 1.1   Thyroid Function Tests: No results for input(s): TSH, T4TOTAL, FREET4, T3FREE, THYROIDAB in the last 72 hours. Lipid Profile: No results for input(s): CHOL, HDL, LDLCALC, TRIG, CHOLHDL, LDLDIRECT in the last 72 hours. Anemia Panel: Recent Labs    10/05/20 1834 10/06/20 0339  FERRITIN 607* 511*   Urine analysis:    Component Value Date/Time   COLORURINE YELLOW 10/05/2020 2119   APPEARANCEUR CLEAR 10/05/2020 2119   LABSPEC 1.016 10/05/2020 2119   PHURINE 5.0 10/05/2020 2119   GLUCOSEU NEGATIVE 10/05/2020 2119   HGBUR SMALL (A) 10/05/2020 2119   BILIRUBINUR NEGATIVE 10/05/2020 2119   BILIRUBINUR neg 06/22/2015 1147   KETONESUR 5 (A) 10/05/2020 2119   PROTEINUR NEGATIVE 10/05/2020 2119   UROBILINOGEN 1.0 06/22/2015 1147   NITRITE NEGATIVE 10/05/2020 2119   LEUKOCYTESUR TRACE (A) 10/05/2020 2119   Sepsis Labs: Invalid input(s): PROCALCITONIN, [place]  Microbiology: Recent Results (from the past 240 hour(s))  Resp Panel by RT-PCR (Flu A&B, Covid) Nasopharyngeal Swab     Status: Abnormal   Collection Time: 10/05/20 12:00 PM   Specimen: Nasopharyngeal Swab; Nasopharyngeal(NP) swabs in vial transport medium  Result Value Ref Range Status   SARS Coronavirus 2 by RT PCR POSITIVE (A) NEGATIVE Final    Comment: RESULT CALLED TO, READ BACK BY AND VERIFIED WITH: Halina Maidens, RN 10/05/20 1446 KDS (NOTE) SARS-CoV-2 target nucleic acids are DETECTED.  The SARS-CoV-2 RNA is generally detectable in upper respiratory specimens during the acute phase of infection. Positive results are indicative of the presence of the identified virus, but do not rule out bacterial infection or co-infection with other pathogens not detected by the test. Clinical correlation with patient history and other diagnostic information is necessary to determine patient infection status. The expected result is  Negative.  Fact Sheet for Patients: EntrepreneurPulse.com.au  Fact Sheet for Healthcare Providers: IncredibleEmployment.be  This test is not yet approved or cleared by the [place] FDA and  has been authorized for detection and/or diagnosis of SARS-CoV-2 by FDA under an Emergency Use Authorization (EUA).  This EUA will remain in effect (meaning this test can be u sed) for the duration of  the COVID-19 declaration under Section 564(b)(1) of the Act, 21 U.S.C.  section 360bbb-3(b)(1), unless the authorization is terminated or revoked sooner.     Influenza A by PCR NEGATIVE NEGATIVE Final   Influenza B by PCR NEGATIVE NEGATIVE Final    Comment: (NOTE) The Xpert Xpress SARS-CoV-2/FLU/RSV plus assay is intended as an aid in the diagnosis of influenza from Nasopharyngeal swab specimens and should not be used as a sole basis for treatment. Nasal washings and aspirates are unacceptable for Xpert Xpress SARS-CoV-2/FLU/RSV testing.  Fact Sheet for Patients: EntrepreneurPulse.com.au  Fact Sheet for Healthcare Providers: IncredibleEmployment.be  This test is not yet approved or cleared by the [place] FDA and has been authorized for detection and/or diagnosis of SARS-CoV-2 by FDA under an Emergency Use Authorization (EUA). This EUA will remain in effect (meaning this test can be used) for the duration of the COVID-19 declaration under Section 564(b)(1) of the Act, 21 U.S.C. section 360bbb-3(b)(1), unless the authorization is terminated or revoked.  Performed at [facility], [place] [address] [place], [place] [address]   Body fluid culture w Gram Stain     Status: None (Preliminary result)   Collection Time: 10/05/20  3:07 PM   Specimen: Peritoneal Cavity; Peritoneal Fluid  Result Value Ref Range Status   Specimen Description   Final    PERITONEAL CAVITY Performed at [place] [address], [place], [place] [address]    Special Requests   Final    NONE Performed at [facility], [place] [address], [place], [place] [address]    Gram Stain   Final    NO SQUAMOUS EPITHELIAL CELLS SEEN FEW WBC SEEN NO ORGANISMS SEEN    Culture   Final    NO GROWTH < 24 HOURS Performed at [facility], [place] [address], [place], [place] [address]    Report Status PENDING  Incomplete  Gram stain     Status: None (Preliminary result)   Collection Time: 10/05/20  5:02 PM   Specimen: Fluid  Result Value Ref Range Status   Specimen Description FLUID RIGHT PERITONEAL  Final   Special Requests NONE  Final   Gram Stain   Final    RARE SQUAMOUS EPITHELIAL CELLS PRESENT RARE WBC SEEN NO ORGANISMS SEEN Performed at [facility], [place] [address], [place], [place] [address]    Report Status PENDING  Incomplete    Radiology Studies: CT ABDOMEN PELVIS WO CONTRAST  Result Date: 10/05/2020 CLINICAL DATA:  Abdominal distension and pain. Vomiting and diarrhea. Sepsis. EXAM: CT ABDOMEN AND PELVIS WITHOUT CONTRAST TECHNIQUE: Multidetector CT imaging of the abdomen and pelvis was performed following the standard protocol without IV contrast. COMPARISON:  None. FINDINGS: Lower chest: No acute findings. Hepatobiliary: No mass visualized on this unenhanced exam. 2 cm gallstone is noted, however there are no signs of acute cholecystitis or biliary ductal dilatation. Pancreas: No mass or inflammatory process visualized on this unenhanced exam. Spleen:  Within normal limits in size. Adrenals/Urinary tract: No evidence of urolithiasis or hydronephrosis. Unremarkable unopacified urinary bladder. Stomach/Bowel: No evidence of obstruction, inflammatory process, or abnormal fluid collections. Diverticulosis is seen mainly involving the sigmoid colon, however there is no evidence of diverticulitis. Vascular/Lymphatic: No pathologically enlarged lymph nodes  identified. No evidence of abdominal aortic aneurysm. Aortic atherosclerotic calcification noted. Reproductive:  No mass or other significant abnormality. Other: Large amount of ascites is seen throughout the abdomen and pelvis. Musculoskeletal:  No suspicious bone lesions identified. IMPRESSION: Large amount of ascites. Cholelithiasis. No radiographic evidence of  acute cholecystitis. Mild sigmoid diverticulosis, without radiographic evidence of diverticulitis. Aortic Atherosclerosis (ICD10-I70.0). Electronically Signed   By: Marlaine Hind M.D.   On: 10/05/2020 13:29   US RENAL  Result Date: 10/06/2020 CLINICAL DATA:  Acute renal injury EXAM: RENAL / URINARY TRACT ULTRASOUND COMPLETE COMPARISON:  CT from earlier in the same day. FINDINGS: Right Kidney: Renal measurements: 9.4 x 4.3 x 6.5 cm. = volume: 135 mL. Echogenicity within normal limits. No mass or hydronephrosis visualized. Left Kidney: Renal measurements: 10.5 x 4.6 x 5.3 cm. = volume: 136 mL. Echogenicity within normal limits. No mass or hydronephrosis visualized. Bladder: Appears normal for degree of bladder distention. Other: Considerable ascites is noted despite recent paracentesis. IMPRESSION: Normal-appearing kidneys. Large volume ascites despite recent paracentesis. Electronically Signed   By: Inez Catalina M.D.   On: 10/05/2020 19:06   US Paracentesis  Result Date: 10/06/2020 CLINICAL DATA:  Acute renal injury EXAM: RENAL / URINARY TRACT ULTRASOUND COMPLETE COMPARISON:  CT from earlier in the same day. FINDINGS: Right Kidney: Renal measurements: 9.4 x 4.3 x 6.5 cm. = volume: 135 mL. Echogenicity within normal limits. No mass or hydronephrosis visualized. Left Kidney: Renal measurements: 10.5 x 4.6 x 5.3 cm. = volume: 136 mL. Echogenicity within normal limits. No mass or hydronephrosis visualized. Bladder: Appears normal for degree of bladder distention. Other: Considerable ascites is noted despite recent paracentesis. IMPRESSION:  Normal-appearing kidneys. Large volume ascites despite recent paracentesis. Electronically Signed   By: Inez Catalina M.D.   On: 10/05/2020 19:06   DG Chest Port 1 View  Result Date: 10/05/2020 CLINICAL DATA:  Questionable sepsis EXAM: PORTABLE CHEST 1 VIEW COMPARISON:  None. FINDINGS: The patient is rotated to the right. The cardiomediastinal silhouette is within normal limits, allowing for low lung volumes and AP technique. Lung volumes are low. There is no focal consolidation or pulmonary edema. There is no pleural effusion or pneumothorax. There is no acute osseous abnormality. IMPRESSION: Low lung volumes. Otherwise, no radiographic evidence of acute cardiopulmonary process. Electronically Signed   By: Valetta Mole M.D.   On: 10/05/2020 12:26   US Abdomen Limited RUQ (LIVER/GB)  Result Date: 10/05/2020 CLINICAL DATA:  Right upper quadrant abdominal pain EXAM: ULTRASOUND ABDOMEN LIMITED RIGHT UPPER QUADRANT COMPARISON:  10/05/2020 CT abdomen/pelvis FINDINGS: Gallbladder: Nondistended gallbladder is completely filled with a densely shadowing 3.6 cm gallstone. No gallbladder wall thickening. No sonographic Murphy's sign. Common bile duct: Diameter: 6 mm on limited views, top-normal Liver: Coarsened liver parenchyma with questionable liver surface irregularity, which may indicate cirrhosis. No liver mass demonstrated on these limited views. Portal vein is patent on color Doppler imaging with normal direction of blood flow towards the liver. Other: Large volume ascites in the upper abdomen. IMPRESSION: 1. Cholelithiasis.  No evidence of acute cholecystitis. 2. Top normal caliber common bile duct, 6 mm diameter. 3. Coarsened liver parenchyma with questionable liver surface irregularity, which may indicate cirrhosis. No liver mass detected on these limited views. Suggest correlation with liver function tests. 4. Large volume upper abdominal ascites. Electronically Signed   By: Ilona Sorrel M.D.   On: 10/05/2020  14:37      Irisha Grandmaison T. [place]  If 7PM-7AM, please contact night-coverage [email or web address] 10/06/2020, 11:22 AM

## 2020-10-06 NOTE — TOC Progression Note (Signed)
Transition of Care [facility]) - Progression Note    Patient Details  Name: Beth Sanchez MRN: [id number] Date of Birth: [birth year]  Transition of Care [facility]) CM/SW Contact  Purcell Mouton, RN Phone Number: 10/06/2020, 4:24 PM  Clinical Narrative:     TOC reviewed chart. Will continue to follow for discharge needs.   Expected Discharge Plan: Home/Self Care Barriers to Discharge: No Barriers Identified  Expected Discharge Plan and Services Expected Discharge Plan: Home/Self Care       Living arrangements for the past 2 months: Single Family Home                                       Social Determinants of Health (SDOH) Interventions    Readmission Risk Interventions No flowsheet data found.

## 2020-10-07 ENCOUNTER — Inpatient Hospital Stay (HOSPITAL_COMMUNITY): Payer: Medicare Other

## 2020-10-07 DIAGNOSIS — K7460 Unspecified cirrhosis of liver: Secondary | ICD-10-CM | POA: Diagnosis not present

## 2020-10-07 DIAGNOSIS — E875 Hyperkalemia: Secondary | ICD-10-CM

## 2020-10-07 DIAGNOSIS — I10 Essential (primary) hypertension: Secondary | ICD-10-CM

## 2020-10-07 DIAGNOSIS — D509 Iron deficiency anemia, unspecified: Secondary | ICD-10-CM

## 2020-10-07 DIAGNOSIS — E871 Hypo-osmolality and hyponatremia: Secondary | ICD-10-CM

## 2020-10-07 DIAGNOSIS — R188 Other ascites: Secondary | ICD-10-CM | POA: Diagnosis present

## 2020-10-07 DIAGNOSIS — E785 Hyperlipidemia, unspecified: Secondary | ICD-10-CM

## 2020-10-07 DIAGNOSIS — E1169 Type 2 diabetes mellitus with other specified complication: Secondary | ICD-10-CM

## 2020-10-07 DIAGNOSIS — N179 Acute kidney failure, unspecified: Secondary | ICD-10-CM

## 2020-10-07 DIAGNOSIS — E1165 Type 2 diabetes mellitus with hyperglycemia: Secondary | ICD-10-CM

## 2020-10-07 LAB — COMPREHENSIVE METABOLIC PANEL
ALT: 9 U/L (ref 0–44)
AST: 13 U/L — ABNORMAL LOW (ref 15–41)
Albumin: 3.8 g/dL (ref 3.5–5.0)
Alkaline Phosphatase: 75 U/L (ref 38–126)
Anion gap: 10 (ref 5–15)
BUN: 65 mg/dL — ABNORMAL HIGH (ref 8–23)
CO2: 20 mmol/L — ABNORMAL LOW (ref 22–32)
Calcium: 9.4 mg/dL (ref 8.9–10.3)
Chloride: 106 mmol/L (ref 98–111)
Creatinine, Ser: 1.61 mg/dL — ABNORMAL HIGH (ref 0.44–1.00)
GFR, Estimated: 35 mL/min — ABNORMAL LOW (ref >60)
Glucose, Bld: 94 mg/dL (ref 70–99)
Potassium: 4.6 mmol/L (ref 3.5–5.1)
Sodium: 136 mmol/L (ref 135–145)
Total Bilirubin: 0.8 mg/dL (ref 0.3–1.2)
Total Protein: 6.6 g/dL (ref 6.5–8.1)

## 2020-10-07 LAB — RENAL FUNCTION PANEL
Albumin: 3.8 g/dL (ref 3.5–5.0)
Anion gap: 10 (ref 5–15)
BUN: 65 mg/dL — ABNORMAL HIGH (ref 8–23)
CO2: 21 mmol/L — ABNORMAL LOW (ref 22–32)
Calcium: 9.3 mg/dL (ref 8.9–10.3)
Chloride: 105 mmol/L (ref 98–111)
Creatinine, Ser: 1.55 mg/dL — ABNORMAL HIGH (ref 0.44–1.00)
GFR, Estimated: 37 mL/min — ABNORMAL LOW (ref >60)
Glucose, Bld: 92 mg/dL (ref 70–99)
Phosphorus: 3.1 mg/dL (ref 2.5–4.6)
Potassium: 4.6 mmol/L (ref 3.5–5.1)
Sodium: 136 mmol/L (ref 135–145)

## 2020-10-07 LAB — CBC
HCT: 30.6 % — ABNORMAL LOW (ref 36.0–46.0)
Hemoglobin: 9.7 g/dL — ABNORMAL LOW (ref 12.0–15.0)
MCH: 26.5 pg (ref 26.0–34.0)
MCHC: 31.7 g/dL (ref 30.0–36.0)
MCV: 83.6 fL (ref 80.0–100.0)
Platelets: 275 10*3/uL (ref 150–400)
RBC: 3.66 MIL/uL — ABNORMAL LOW (ref 3.87–5.11)
RDW: 16.1 % — ABNORMAL HIGH (ref 11.5–15.5)
WBC: 5 10*3/uL (ref 4.0–10.5)
nRBC: 0 % (ref 0.0–0.2)

## 2020-10-07 LAB — BODY FLUID CELL COUNT WITH DIFFERENTIAL
Eos, Fluid: 1 %
Lymphs, Fluid: 60 %
Monocyte-Macrophage-Serous Fluid: 23 % — ABNORMAL LOW (ref 50–90)
Neutrophil Count, Fluid: 16 % (ref 0–25)
Total Nucleated Cell Count, Fluid: 327 cu mm (ref 0–1000)

## 2020-10-07 LAB — ALBUMIN, PLEURAL OR PERITONEAL FLUID: Albumin, Fluid: 2.6 g/dL

## 2020-10-07 LAB — CK: Total CK: 9 U/L — ABNORMAL LOW (ref 38–234)

## 2020-10-07 LAB — AMYLASE, PLEURAL OR PERITONEAL FLUID: Amylase, Fluid: 55 U/L

## 2020-10-07 LAB — HEMOGLOBIN A1C
Hgb A1c MFr Bld: 5.9 % — ABNORMAL HIGH (ref 4.8–5.6)
Mean Plasma Glucose: 122.63 mg/dL

## 2020-10-07 LAB — AFP TUMOR MARKER: AFP, Serum, Tumor Marker: 1.8 ng/mL (ref 0.0–9.2)

## 2020-10-07 LAB — IRON: Iron: 19 ug/dL — ABNORMAL LOW (ref 28–170)

## 2020-10-07 LAB — MAGNESIUM: Magnesium: 2.1 mg/dL (ref 1.7–2.4)

## 2020-10-07 LAB — GLUCOSE, PLEURAL OR PERITONEAL FLUID: Glucose, Fluid: 51 mg/dL

## 2020-10-07 LAB — PROTEIN, PLEURAL OR PERITONEAL FLUID: Total protein, fluid: 4.9 g/dL

## 2020-10-07 LAB — BRAIN NATRIURETIC PEPTIDE: B Natriuretic Peptide: 89.7 pg/mL (ref 0.0–100.0)

## 2020-10-07 LAB — HEPATITIS B E ANTIGEN: Hep B E Ag: NEGATIVE

## 2020-10-07 LAB — GLUCOSE, CAPILLARY
Glucose-Capillary: 81 mg/dL (ref 70–99)
Glucose-Capillary: 83 mg/dL (ref 70–99)

## 2020-10-07 MED ORDER — BOOST / RESOURCE BREEZE PO LIQD CUSTOM: 1.0000 | ORAL | Status: DC

## 2020-10-07 MED ORDER — SODIUM CHLORIDE 0.9 % IV SOLN
250.0000 mg | Freq: Every day | INTRAVENOUS | Status: AC
  Administered 2020-10-07 – 2020-10-08 (×2): 250 mg via INTRAVENOUS
  Filled 2020-10-07 (×2): qty 20

## 2020-10-07 MED ORDER — PEG-KCL-NACL-NASULF-NA ASC-C 100 G PO SOLR
0.5000 | Freq: Once | ORAL | Status: AC
  Administered 2020-10-07: 100 g via ORAL
  Filled 2020-10-07: qty 1

## 2020-10-07 MED ORDER — PEG-KCL-NACL-NASULF-NA ASC-C 100 G PO SOLR: 0.5000 | Freq: Once | ORAL | Status: DC

## 2020-10-07 MED ORDER — PEG-KCL-NACL-NASULF-NA ASC-C 100 G PO SOLR: 1.0000 | Freq: Once | ORAL | Status: DC

## 2020-10-07 MED ORDER — ADULT MULTIVITAMIN W/MINERALS CH
1.0000 | ORAL_TABLET | Freq: Every day | ORAL | Status: DC
  Administered 2020-10-09 – 2020-10-21 (×11): 1 via ORAL
  Filled 2020-10-07 (×12): qty 1

## 2020-10-07 MED ORDER — LIDOCAINE HCL 1 % IJ SOLN
INTRAMUSCULAR | Status: AC
  Filled 2020-10-07: qty 20

## 2020-10-07 MED ORDER — PROSOURCE PLUS PO LIQD
30.0000 mL | Freq: Three times a day (TID) | ORAL | Status: DC
  Administered 2020-10-07 – 2020-10-10 (×3): 30 mL via ORAL
  Filled 2020-10-07 (×7): qty 30

## 2020-10-07 NOTE — Progress Notes (Addendum)
[facility] Progress Note  CC:  New onset ascites, cirrhosis suspected   Subjective:  She had RLQ pain early am,  reduced after she received Oxycodone. No N/V. No CP or SOB. No BM since admission. No family at the bedside.    Objective:  Vital signs in last 24 hours: Temp:  [97.6 F (36.4 C)-98 F (36.7 C)] 98 F (36.7 C) (08/16 2016) Pulse Rate:  [98-105] 102 (08/16 2016) Resp:  [14-20] 14 (08/16 2016) BP: (78-112)/(47-82) 112/82 (08/16 2016) SpO2:  [99 %-100 %] 100 % (08/16 2016) Last BM Date: 10/05/20  General:   Alert 65 year old female in NAD.  Heart: RRR, no murmur Pulm:  Breath sounds decreased RLL otherwise clear.  Abdomen: Protuberant with ascites, upper abdomen with distension which drops off without lower abdominal distension. No obvious HSM. + BS x 4 quads.  Extremities:  Bilateral lower extremities with mild edema and erythema.  Neurologic:  Alert and  oriented x 4 Grossly normal neurologically. Psych:  Alert and cooperative. Normal mood and affect. Skin: No jaundice.   Intake/Output from previous day: 08/16 0701 - 08/17 0700 In: 390 [P.O.:240; IV Piggyback:150] Out: -  Intake/Output this shift: No intake/output data recorded.  Lab Results: Recent Labs    10/05/20 1136 10/06/20 0339 10/07/20 0407  WBC 6.6 6.6 5.0  HGB 11.6* 10.1* 9.7*  HCT 36.4 32.2* 30.6*  PLT 375 281 275   BMET Recent Labs    10/05/20 1136 10/06/20 0339 10/07/20 0407  NA 131* 133* 136  136  K 5.2* 5.4* 4.6  4.6  CL 101 103 105  106  CO2 18* 18* 21*  20*  GLUCOSE 96 71 92  94  BUN 93* 77* 65*  65*  CREATININE 3.34* 2.28* 1.55*  1.61*  CALCIUM 9.0 9.0 9.3  9.4   LFT Recent Labs    10/07/20 0407  PROT 6.6  ALBUMIN 3.8  3.8  AST 13*  ALT 9  ALKPHOS 75  BILITOT 0.8   PT/INR Recent Labs    10/05/20 1136  LABPROT 13.9  INR 1.1   Hepatitis Panel Recent Labs    10/05/20 1834  HEPBSAG NON REACTIVE  HCVAB NON REACTIVE    CT  ABDOMEN PELVIS WO CONTRAST  Result Date: 10/05/2020 CLINICAL DATA:  Abdominal distension and pain. Vomiting and diarrhea. Sepsis. EXAM: CT ABDOMEN AND PELVIS WITHOUT CONTRAST TECHNIQUE: Multidetector CT imaging of the abdomen and pelvis was performed following the standard protocol without IV contrast. COMPARISON:  None. FINDINGS: Lower chest: No acute findings. Hepatobiliary: No mass visualized on this unenhanced exam. 2 cm gallstone is noted, however there are no signs of acute cholecystitis or biliary ductal dilatation. Pancreas: No mass or inflammatory process visualized on this unenhanced exam. Spleen:  Within normal limits in size. Adrenals/Urinary tract: No evidence of urolithiasis or hydronephrosis. Unremarkable unopacified urinary bladder. Stomach/Bowel: No evidence of obstruction, inflammatory process, or abnormal fluid collections. Diverticulosis is seen mainly involving the sigmoid colon, however there is no evidence of diverticulitis. Vascular/Lymphatic: No pathologically enlarged lymph nodes identified. No evidence of abdominal aortic aneurysm. Aortic atherosclerotic calcification noted. Reproductive:  No mass or other significant abnormality. Other: Large amount of ascites is seen throughout the abdomen and pelvis. Musculoskeletal:  No suspicious bone lesions identified. IMPRESSION: Large amount of ascites. Cholelithiasis. No radiographic evidence of acute cholecystitis. Mild sigmoid diverticulosis, without radiographic evidence of diverticulitis. Aortic Atherosclerosis (ICD10-I70.0). Electronically Signed   By: Marlaine Hind M.D.   On: 10/05/2020  13:29   US RENAL  Result Date: 10/06/2020 CLINICAL DATA:  Acute renal injury EXAM: RENAL / URINARY TRACT ULTRASOUND COMPLETE COMPARISON:  CT from earlier in the same day. FINDINGS: Right Kidney: Renal measurements: 9.4 x 4.3 x 6.5 cm. = volume: 135 mL. Echogenicity within normal limits. No mass or hydronephrosis visualized. Left Kidney: Renal  measurements: 10.5 x 4.6 x 5.3 cm. = volume: 136 mL. Echogenicity within normal limits. No mass or hydronephrosis visualized. Bladder: Appears normal for degree of bladder distention. Other: Considerable ascites is noted despite recent paracentesis. IMPRESSION: Normal-appearing kidneys. Large volume ascites despite recent paracentesis. Electronically Signed   By: Inez Catalina M.D.   On: 10/05/2020 19:06   US Paracentesis  Result Date: 10/06/2020 CLINICAL DATA:  Acute renal injury EXAM: RENAL / URINARY TRACT ULTRASOUND COMPLETE COMPARISON:  CT from earlier in the same day. FINDINGS: Right Kidney: Renal measurements: 9.4 x 4.3 x 6.5 cm. = volume: 135 mL. Echogenicity within normal limits. No mass or hydronephrosis visualized. Left Kidney: Renal measurements: 10.5 x 4.6 x 5.3 cm. = volume: 136 mL. Echogenicity within normal limits. No mass or hydronephrosis visualized. Bladder: Appears normal for degree of bladder distention. Other: Considerable ascites is noted despite recent paracentesis. IMPRESSION: Normal-appearing kidneys. Large volume ascites despite recent paracentesis. Electronically Signed   By: Inez Catalina M.D.   On: 10/05/2020 19:06   DG Chest Port 1 View  Result Date: 10/05/2020 CLINICAL DATA:  Questionable sepsis EXAM: PORTABLE CHEST 1 VIEW COMPARISON:  None. FINDINGS: The patient is rotated to the right. The cardiomediastinal silhouette is within normal limits, allowing for low lung volumes and AP technique. Lung volumes are low. There is no focal consolidation or pulmonary edema. There is no pleural effusion or pneumothorax. There is no acute osseous abnormality. IMPRESSION: Low lung volumes. Otherwise, no radiographic evidence of acute cardiopulmonary process. Electronically Signed   By: Valetta Mole M.D.   On: 10/05/2020 12:26   ECHOCARDIOGRAM COMPLETE  Result Date: 10/06/2020    ECHOCARDIOGRAM REPORT   Patient Name:   SHAMARIE CALL Date of Exam: 10/06/2020 Medical Rec #:  [id number]       Height:       67.0 in Accession #:    [id number]     Weight:       193.3 lb Date of Birth:  [birth year]      BSA:          1.993 m Patient Age:    89 years       BP:           94/71 mmHg Patient Gender: F              HR:           100 bpm. Exam Location:  Inpatient Procedure: 2D Echo, Cardiac Doppler and Color Doppler                       STAT ECHO Reported to: Dr Jenkins Rouge on 10/06/2020 1:26:00 PM. Indications:    Ascites [id number]  History:        Patient has no prior history of Echocardiogram examinations.                 Risk Factors:Hypertension. COVID-19 Positive.  Sonographer:    Tiffany Dance RVT Referring Phys: [id number] [place]  1. Ascites noted on sub costal images Would not appear to be cardiac related.  2. Left ventricular ejection fraction, by estimation, is  60 to 65%. The left ventricle has normal function. The left ventricle has no regional wall motion abnormalities. Left ventricular diastolic parameters are consistent with Grade I diastolic dysfunction (impaired relaxation).  3. Right ventricular systolic function is normal. The right ventricular size is normal. There is normal pulmonary artery systolic pressure.  4. Left atrial size was mildly dilated.  5. The mitral valve is abnormal. No evidence of mitral valve regurgitation. No evidence of mitral stenosis.  6. The aortic valve is tricuspid. There is mild calcification of the aortic valve. Aortic valve regurgitation is not visualized. Mild aortic valve sclerosis is present, with no evidence of aortic valve stenosis.  7. The inferior vena cava is normal in size with greater than 50% respiratory variability, suggesting right atrial pressure of 3 mmHg. FINDINGS  Left Ventricle: Left ventricular ejection fraction, by estimation, is 60 to 65%. The left ventricle has normal function. The left ventricle has no regional wall motion abnormalities. The left ventricular internal cavity size was normal in size. There is  no left  ventricular hypertrophy. Left ventricular diastolic parameters are consistent with Grade I diastolic dysfunction (impaired relaxation). Right Ventricle: The right ventricular size is normal. No increase in right ventricular wall thickness. Right ventricular systolic function is normal. There is normal pulmonary artery systolic pressure. The tricuspid regurgitant velocity is 2.02 m/s, and  with an assumed right atrial pressure of 3 mmHg, the estimated right ventricular systolic pressure is 66.4 mmHg. Left Atrium: Left atrial size was mildly dilated. Right Atrium: Right atrial size was normal in size. Pericardium: There is no evidence of pericardial effusion. Mitral Valve: The mitral valve is abnormal. There is mild thickening of the mitral valve leaflet(s). There is mild calcification of the mitral valve leaflet(s). Mild mitral annular calcification. No evidence of mitral valve regurgitation. No evidence of mitral valve stenosis. Tricuspid Valve: The tricuspid valve is normal in structure. Tricuspid valve regurgitation is mild . No evidence of tricuspid stenosis. Aortic Valve: The aortic valve is tricuspid. There is mild calcification of the aortic valve. Aortic valve regurgitation is not visualized. Mild aortic valve sclerosis is present, with no evidence of aortic valve stenosis. Pulmonic Valve: The pulmonic valve was normal in structure. Pulmonic valve regurgitation is not visualized. No evidence of pulmonic stenosis. Aorta: The aortic root is normal in size and structure. Venous: The inferior vena cava is normal in size with greater than 50% respiratory variability, suggesting right atrial pressure of 3 mmHg. IAS/Shunts: No atrial level shunt detected by color flow Doppler. Additional Comments: Ascites noted on sub costal images Would not appear to be cardiac related.  LEFT VENTRICLE PLAX 2D LVIDd:         3.70 cm  Diastology LVIDs:         2.30 cm  LV e' medial:    7.40 cm/s LV PW:         1.10 cm  LV E/e'  medial:  9.8 LV IVS:        1.00 cm  LV e' lateral:   10.40 cm/s LVOT diam:     2.10 cm  LV E/e' lateral: 6.9 LV SV:         63 LV SV Index:   31 LVOT Area:     3.46 cm  RIGHT VENTRICLE             IVC RV Basal diam:  2.30 cm     IVC diam: 1.80 cm RV S prime:     12.10 cm/s  TAPSE (M-mode): 2.0 cm LEFT ATRIUM             Index       RIGHT ATRIUM          Index LA diam:        3.80 cm 1.91 cm/m  RA Area:     8.79 cm LA Vol (A2C):   27.3 ml 13.70 ml/m RA Volume:   15.70 ml 7.88 ml/m LA Vol (A4C):   39.1 ml 19.62 ml/m LA Biplane Vol: 33.2 ml 16.66 ml/m  AORTIC VALVE LVOT Vmax:   130.00 cm/s LVOT Vmean:  81.300 cm/s LVOT VTI:    0.180 m  AORTA Ao Root diam: 3.20 cm Ao Asc diam:  3.30 cm MITRAL VALVE                TRICUSPID VALVE MV Area (PHT): 4.31 cm     TR Peak grad:   16.3 mmHg MV Decel Time: 176 msec     TR Vmax:        202.00 cm/s MV E velocity: 72.20 cm/s MV A velocity: 122.00 cm/s  SHUNTS MV E/A ratio:  0.59         Systemic VTI:  0.18 m                             Systemic Diam: 2.10 cm Jenkins Rouge MD Electronically signed by Jenkins Rouge MD Signature Date/Time: 10/06/2020/1:35:19 PM    Final    US Abdomen Limited RUQ (LIVER/GB)  Result Date: 10/05/2020 CLINICAL DATA:  Right upper quadrant abdominal pain EXAM: ULTRASOUND ABDOMEN LIMITED RIGHT UPPER QUADRANT COMPARISON:  10/05/2020 CT abdomen/pelvis FINDINGS: Gallbladder: Nondistended gallbladder is completely filled with a densely shadowing 3.6 cm gallstone. No gallbladder wall thickening. No sonographic Murphy's sign. Common bile duct: Diameter: 6 mm on limited views, top-normal Liver: Coarsened liver parenchyma with questionable liver surface irregularity, which may indicate cirrhosis. No liver mass demonstrated on these limited views. Portal vein is patent on color Doppler imaging with normal direction of blood flow towards the liver. Other: Large volume ascites in the upper abdomen. IMPRESSION: 1. Cholelithiasis.  No evidence of acute  cholecystitis. 2. Top normal caliber common bile duct, 6 mm diameter. 3. Coarsened liver parenchyma with questionable liver surface irregularity, which may indicate cirrhosis. No liver mass detected on these limited views. Suggest correlation with liver function tests. 4. Large volume upper abdominal ascites. Electronically Signed   By: Ilona Sorrel M.D.   On: 10/05/2020 14:37    Assessment / Plan:  29)  65 year old female with new onset abdominal distention with generalized abdominal pain. CTAP and RUQ imaging identified a large amount of ascites.  RUQ showed coarsened liver parenchyma with questionable liver surface irregularity which could indicate cirrhosis.  Less likely cirrhosis with normal LFTs, platelet count and INR. S/P paracentesis 8/15 removed 3.5 L peritoneal fluid, no evidence of SBP. SAAG was 0.4 which is not consistent with cirrhosis/portal hypertension and is concerning for inflammatory/infectious or malignant process. Peritoneal LDH elevated at 497 which also supports infectious vs malignant ascites. Peritoneal protein level elevated at 5.2.  ECHO 8/16 with normal RV function and LV EF 60 - 65%. Alk phos 93.  AST 13.  ALT 10. Total bili 0.8.  Albumin 3.2 -> 3.8.  Hepatitis B surface antigen negative.  Hep C antibody negative.  AFP pending. AMA, SMA, ANA and IgG levels pending.  -Continue IV Albumin 25GM IV Q 6  for now -Repeat paracentesis today, 6L max peritoneal fluid to be removed. Peritoneal fluid to include cell count and diff, gram stain, aerobic and anaerobic cultures, albumin level, protein level, cytology, TG, CEA, glucose, amylase, adenosine deaminase and mycobacteria culture -CA 19-9, CEA, CA 125 BNP -Dr. Lyndel Safe to review CTAP with radiologist, if the radiologist feels strongly that she has underlying liver cirrhosis, then would consider hepatic venous pressure gradient (HVPG) determination and transjugular liver Bx at the same time. -Chest CT without contrast to rule out lung  mass/malignancy  -Pain management  and IV fluids per the hospitalist  2) Iron deficiency anemia. CTAP without evidence of upper or lower GI mass and no splenomegaly.  Hg 11.6 -> 10.1 -> 9.7. MCV 83.6. Iron 19. Ferritin 511.  -IV iron -Clear liquid diet -NPO after midnight -EGD and colonoscopy to rule out upper/lower GI malignancy on Thurs 10/08/2020 benefits and risks discussed including risk with sedation, risk of bleeding, perforation and infection  -CBC in am  3) AKI. Renal sodium level 63, unlikely HRS. Urine protein level negative, unlikely nephrotic syndrome. Possibly due to hypotension, dehydration +/- Jardiance. Cr 3.34 -> 2.28 ->1.61 -> 1.55.   4)  Covid 19 +, asymptomatic  5)  Malnutrition, weight loss  -EGD and colonoscopy to rule out GI malignancy as noted above -Nutritional consult   6) DM II       Active Problems:   Cirrhosis of liver ([place])   Cirrhosis ([place])     LOS: 2 days   Noralyn Pick  10/07/2020, 08:39 AM   Attending physician's note   I have taken an interval history, reviewed the chart and examined the patient. I agree with the Advanced Practitioner's note, impression and recommendations.   S/P another paracentesis EGD/colon on AM Studies pending  Carmell Austria, MD [facility] [phone]

## 2020-10-07 NOTE — Progress Notes (Signed)
Initial Nutrition Assessment RD working remotely.   DOCUMENTATION CODES:   Not applicable  INTERVENTION:  - will order Boost Breeze once/day, each supplement provides 250 kcal and 9 grams of protein. - will order 30 ml Prosource Plus TID, each supplement provides 100 kcal and 15 grams protein.  - will order 1 tablet multivitamin with minerals/day. - diet advancement as medically feasible.    NUTRITION DIAGNOSIS:   Increased nutrient needs related to acute illness, catabolic illness ([id number] infection) as evidenced by estimated needs.  GOAL:   Patient will meet greater than or equal to 90% of their needs  MONITOR:   PO intake, Supplement acceptance, Diet advancement, Labs, Weight trends, I & O's  REASON FOR ASSESSMENT:   Malnutrition Screening Tool  ASSESSMENT:   65 year old female with medical history of HTN, type 2 DM, and obesity. She presentd to the ED due to increasing abdominal pain, distention, lower back pain, emesis, and fatigue. She was found to have liver cirrhosis with ascites, AKI, hypotension, mild hyponatremia, and COVID-19 infection. She had IR paracentesis with removal of 3.5 L on 8/15 and repeat paracentesis with removal of 3.3 L on 8/17.  Diet advanced from NPO to Heart Healthy/Carb Modified on 8/15 at 1703 and downgraded to CLD today at 0747. The only documented meal intake was 100% of lunch yesterday.   She has not been seen by a  RD at any time in the past.   Weight yesterday was 193 lb and weight on 09/10/20 was 201 lb. This indicates 8 lb weight loss (4% body weight) in 1 month; not significant for time frame.   Prior to that, most recently documented weight was on 02/06/20 when she weighed 234 lb. This indicates 41 lb weight loss (17.5% body weight) in the past 8 months; significant for time frame.   No information documented in the edema section of flow sheet.   Per notes: - pending EGD and colonoscopy to rule out GI malignancy -  AKI - new onset liver cirrhosis with large volume ascites s/p paracentesis x2 - incidentally found to be COVID-19 positive   Labs reviewed; CBG: 81 mg/dl, BUN: 65 mg/dl, creatinine: 1.55 mg/dl,  Medications reviewed; sliding scale novolog, 40 mg oral protonix/day.    NUTRITION - FOCUSED PHYSICAL EXAM:  Unable to complete at this time.  Diet Order:   Diet Order             Diet NPO time specified  Diet effective midnight           Diet clear liquid Room service appropriate? Yes; Fluid consistency: Thin  Diet effective now                   EDUCATION NEEDS:   Not appropriate for education at this time  Skin:  Skin Assessment: Reviewed RN Assessment  Last BM:  8/15  Height:   Ht Readings from Last 1 Encounters:  10/06/20 '5\' 7"'$  (1.702 m)    Weight:   Wt Readings from Last 1 Encounters:  10/06/20 87.7 kg     Estimated Nutritional Needs:  Kcal:  2100-2300 kcal Protein:  105-120 grams Fluid:  >/= 1.6 L/day      Jarome Matin, MS, RD, LDN, CNSC Inpatient Clinical Dietitian RD pager # available in [facility]  After hours/weekend pager # available in [facility]

## 2020-10-07 NOTE — Procedures (Signed)
Ultrasound-guided diagnostic and therapeutic paracentesis performed yielding 3.3 liters of hazy,dark amber fluid. No immediate complications. A portion of the fluid was sent to the lab for preordered studies. EBL none.

## 2020-10-07 NOTE — Progress Notes (Addendum)
MEDICATION RELATED CONSULT NOTE - INITIAL   Pharmacy Consult for IV iron Indication: iron deficiency anemia  No Known Allergies  Patient Measurements: Height: '5\' 7"'$  (170.2 cm) Weight: 87.7 kg (193 lb 5.5 oz) IBW/kg (Calculated) : 61.6  Vital Signs: BP: 108/79 (08/17 0500) Pulse Rate: 96 (08/17 0500) Intake/Output from previous day: 08/16 0701 - 08/17 0700 In: 390 [P.O.:240; IV Piggyback:150] Out: -  Intake/Output from this shift: No intake/output data recorded.  Labs: Recent Labs    10/05/20 1136 10/05/20 2119 10/06/20 0339 10/06/20 1659 10/07/20 0407  WBC 6.6  --  6.6  --  5.0  HGB 11.6*  --  10.1*  --  9.7*  HCT 36.4  --  32.2*  --  30.6*  PLT 375  --  281  --  275  APTT 30  --   --   --   --   CREATININE 3.34*  --  2.28*  --  1.55*  1.61*  LABCREA  --  153.91  --  111.11  --   MG  --   --  2.1  --  2.1  PHOS  --   --  4.6  --  3.1  ALBUMIN 3.0*  --  3.2*  --  3.8  3.8  PROT 7.4  --  6.5  --  6.6  AST 16  --  13*  --  13*  ALT 12  --  10  --  9  ALKPHOS 122  --  93  --  75  BILITOT 0.7  --  0.8  --  0.8   Estimated Creatinine Clearance: 41.1 mL/min (A) (by C-G formula based on SCr of 1.55 mg/dL (H)).   Microbiology: Recent Results (from the past 720 hour(s))  Blood Culture (routine x 2)     Status: None (Preliminary result)   Collection Time: 10/05/20 11:36 AM   Specimen: BLOOD  Result Value Ref Range Status   Specimen Description   Final    BLOOD LEFT ANTECUBITAL Performed at [facility] [address], [place], [place] [address]    Special Requests   Final    BOTTLES DRAWN AEROBIC AND ANAEROBIC Blood Culture results may not be optimal due to an inadequate volume of blood received in culture bottles Performed at [facility] [address] [place], [place] [address]    Culture   Final    NO GROWTH 1 DAY Performed at [facility], [place] [address], [place], [place] [address]    Report Status PENDING   Incomplete  Resp Panel by RT-PCR (Flu A&B, Covid) Nasopharyngeal Swab     Status: Abnormal   Collection Time: 10/05/20 12:00 PM   Specimen: Nasopharyngeal Swab; Nasopharyngeal(NP) swabs in vial transport medium  Result Value Ref Range Status   SARS Coronavirus 2 by RT PCR POSITIVE (A) NEGATIVE Final    Comment: RESULT CALLED TO, READ BACK BY AND VERIFIED WITH: Halina Maidens, RN 10/05/20 1446 KDS (NOTE) SARS-CoV-2 target nucleic acids are DETECTED.  The SARS-CoV-2 RNA is generally detectable in upper respiratory specimens during the acute phase of infection. Positive results are indicative of the presence of the identified virus, but do not rule out bacterial infection or co-infection with other pathogens not detected by the test. Clinical correlation with patient history and other diagnostic information is necessary to determine patient infection status. The expected result is Negative.  Fact Sheet for Patients: EntrepreneurPulse.com.au  Fact Sheet for Healthcare Providers: IncredibleEmployment.be  This  test is not yet approved or cleared by the [place] and  has been authorized for detection and/or diagnosis of SARS-CoV-2 by FDA under an Emergency Use Authorization (EUA).  This EUA will remain in effect (meaning this test can be u sed) for the duration of  the COVID-19 declaration under Section 564(b)(1) of the Act, 21 U.S.C. section 360bbb-3(b)(1), unless the authorization is terminated or revoked sooner.     Influenza A by PCR NEGATIVE NEGATIVE Final   Influenza B by PCR NEGATIVE NEGATIVE Final    Comment: (NOTE) The Xpert Xpress SARS-CoV-2/FLU/RSV plus assay is intended as an aid in the diagnosis of influenza from Nasopharyngeal swab specimens and should not be used as a sole basis for treatment. Nasal washings and aspirates are unacceptable for Xpert Xpress SARS-CoV-2/FLU/RSV testing.  Fact Sheet for  Patients: EntrepreneurPulse.com.au  Fact Sheet for Healthcare Providers: IncredibleEmployment.be  This test is not yet approved or cleared by the [place] FDA and has been authorized for detection and/or diagnosis of SARS-CoV-2 by FDA under an Emergency Use Authorization (EUA). This EUA will remain in effect (meaning this test can be used) for the duration of the COVID-19 declaration under Section 564(b)(1) of the Act, 21 U.S.C. section 360bbb-3(b)(1), unless the authorization is terminated or revoked.  Performed at [facility], [place] [address] [place], [place] [address]   Blood Culture (routine x 2)     Status: None (Preliminary result)   Collection Time: 10/05/20 12:00 PM   Specimen: BLOOD  Result Value Ref Range Status   Specimen Description   Final    BLOOD RIGHT ANTECUBITAL Performed at [facility] [address], [place], [place] [address]    Special Requests   Final    BOTTLES DRAWN AEROBIC AND ANAEROBIC Blood Culture results may not be optimal due to an inadequate volume of blood received in culture bottles Performed at [facility] [address], [place], [place] [address]    Culture   Final    NO GROWTH < 24 HOURS Performed at [facility] [address], [place], [place] [address]    Report Status PENDING  Incomplete  Body fluid culture w Gram Stain     Status: None (Preliminary result)   Collection Time: 10/05/20  3:07 PM   Specimen: Peritoneal Cavity; Peritoneal Fluid  Result Value Ref Range Status   Specimen Description   Final    PERITONEAL CAVITY Performed at [facility] [address] [place], [place] [address]    Special Requests   Final    NONE Performed at [facility], [place] [address] [place], [place] [address]    Gram Stain   Final    NO SQUAMOUS EPITHELIAL CELLS SEEN FEW WBC SEEN NO ORGANISMS  SEEN    Culture   Final    NO GROWTH < 24 HOURS Performed at [facility], [place] [address] [place], [place] [address]    Report Status PENDING  Incomplete  Culture, body fluid w Gram Stain-bottle     Status: None (Preliminary result)   Collection Time: 10/05/20  5:02 PM   Specimen: Fluid  Result Value Ref Range Status   Specimen Description FLUID RIGHT PERITONEAL  Final   Special Requests   Final    BOTTLES DRAWN AEROBIC AND ANAEROBIC Blood Culture adequate volume   Culture   Final    NO GROWTH < 24 HOURS Performed at [facility], [address], [place], [place] [address]    Report Status PENDING  Incomplete  Gram stain     Status: None   Collection Time: 10/05/20  5:02 PM   Specimen: Fluid  Result Value Ref Range Status   Specimen Description FLUID RIGHT PERITONEAL  Final   Special Requests NONE  Final   Gram Stain   Final    RARE SQUAMOUS EPITHELIAL CELLS PRESENT RARE WBC SEEN NO ORGANISMS SEEN Performed at [facility], [place] [address], [place], [place] [address]    Report Status 10/06/2020 FINAL  Final  Urine Culture     Status: None (Preliminary result)   Collection Time: 10/05/20  9:19 PM   Specimen: In/Out Cath Urine  Result Value Ref Range Status   Specimen Description   Final    IN/OUT CATH URINE Performed at [facility] [address], [place], [place] [address]    Special Requests   Final    NONE Performed at [facility], [place] [address] [place], [place] [address]    Culture   Final    CULTURE REINCUBATED FOR BETTER GROWTH Performed at [facility], [place] [address], [place], [place] [address]    Report Status PENDING  Incomplete    Medical History: Past Medical History:  Diagnosis Date   Hypertension    Psoriasis     Medications:  Scheduled:   heparin  5,000 Units Subcutaneous Q12H   insulin aspart  0-9 Units Subcutaneous TID WC   pantoprazole  40 mg Oral Daily   simvastatin  20  mg Oral QHS   Infusions:   albumin human 25 g (10/07/20 0446)   cefTRIAXone (ROCEPHIN)  IV 2 g (10/06/20 1325)   ferric gluconate (FERRLECIT) IVPB      Assessment: 65 yo female presenting with increasing abdominal pain, distension, lower back pain, emesis and fatigue.  Found to have liver cirrhosis w/ ascites.  Hemoglobin with steady decline from 11.6 >> 9.7.  MCV 83.6, Iron 19, Ferritin 511.  Pharmacy consulted to dose IV iron secondary to iron deficiency anemia.  Plan:  Ferrlecit '250mg'$  IV daily x 2 doses Would recommend rechecking iron panel in ~2 weeks Pharmacy to sign off at this time and will follow peripherally  Thank you for involving pharmacy in this patient's care  Dimple Nanas, PharmD 10/07/2020 8:29 AM

## 2020-10-07 NOTE — H&P (View-Only) (Signed)
[facility] Progress Note  CC:  New onset ascites, cirrhosis suspected   Subjective:  She had RLQ pain early am,  reduced after she received Oxycodone. No N/V. No CP or SOB. No BM since admission. No family at the bedside.    Objective:  Vital signs in last 24 hours: Temp:  [97.6 F (36.4 C)-98 F (36.7 C)] 98 F (36.7 C) (08/16 2016) Pulse Rate:  [98-105] 102 (08/16 2016) Resp:  [14-20] 14 (08/16 2016) BP: (78-112)/(47-82) 112/82 (08/16 2016) SpO2:  [99 %-100 %] 100 % (08/16 2016) Last BM Date: 10/05/20  General:   Alert 65 year old female in NAD.  Heart: RRR, no murmur Pulm:  Breath sounds decreased RLL otherwise clear.  Abdomen: Protuberant with ascites, upper abdomen with distension which drops off without lower abdominal distension. No obvious HSM. + BS x 4 quads.  Extremities:  Bilateral lower extremities with mild edema and erythema.  Neurologic:  Alert and  oriented x 4 Grossly normal neurologically. Psych:  Alert and cooperative. Normal mood and affect. Skin: No jaundice.   Intake/Output from previous day: 08/16 0701 - 08/17 0700 In: 390 [P.O.:240; IV Piggyback:150] Out: -  Intake/Output this shift: No intake/output data recorded.  Lab Results: Recent Labs    10/05/20 1136 10/06/20 0339 10/07/20 0407  WBC 6.6 6.6 5.0  HGB 11.6* 10.1* 9.7*  HCT 36.4 32.2* 30.6*  PLT 375 281 275   BMET Recent Labs    10/05/20 1136 10/06/20 0339 10/07/20 0407  NA 131* 133* 136  136  K 5.2* 5.4* 4.6  4.6  CL 101 103 105  106  CO2 18* 18* 21*  20*  GLUCOSE 96 71 92  94  BUN 93* 77* 65*  65*  CREATININE 3.34* 2.28* 1.55*  1.61*  CALCIUM 9.0 9.0 9.3  9.4   LFT Recent Labs    10/07/20 0407  PROT 6.6  ALBUMIN 3.8  3.8  AST 13*  ALT 9  ALKPHOS 75  BILITOT 0.8   PT/INR Recent Labs    10/05/20 1136  LABPROT 13.9  INR 1.1   Hepatitis Panel Recent Labs    10/05/20 1834  HEPBSAG NON REACTIVE  HCVAB NON REACTIVE    CT  ABDOMEN PELVIS WO CONTRAST  Result Date: 10/05/2020 CLINICAL DATA:  Abdominal distension and pain. Vomiting and diarrhea. Sepsis. EXAM: CT ABDOMEN AND PELVIS WITHOUT CONTRAST TECHNIQUE: Multidetector CT imaging of the abdomen and pelvis was performed following the standard protocol without IV contrast. COMPARISON:  None. FINDINGS: Lower chest: No acute findings. Hepatobiliary: No mass visualized on this unenhanced exam. 2 cm gallstone is noted, however there are no signs of acute cholecystitis or biliary ductal dilatation. Pancreas: No mass or inflammatory process visualized on this unenhanced exam. Spleen:  Within normal limits in size. Adrenals/Urinary tract: No evidence of urolithiasis or hydronephrosis. Unremarkable unopacified urinary bladder. Stomach/Bowel: No evidence of obstruction, inflammatory process, or abnormal fluid collections. Diverticulosis is seen mainly involving the sigmoid colon, however there is no evidence of diverticulitis. Vascular/Lymphatic: No pathologically enlarged lymph nodes identified. No evidence of abdominal aortic aneurysm. Aortic atherosclerotic calcification noted. Reproductive:  No mass or other significant abnormality. Other: Large amount of ascites is seen throughout the abdomen and pelvis. Musculoskeletal:  No suspicious bone lesions identified. IMPRESSION: Large amount of ascites. Cholelithiasis. No radiographic evidence of acute cholecystitis. Mild sigmoid diverticulosis, without radiographic evidence of diverticulitis. Aortic Atherosclerosis (ICD10-I70.0). Electronically Signed   By: Marlaine Hind M.D.   On: 10/05/2020  13:29   US RENAL  Result Date: 10/06/2020 CLINICAL DATA:  Acute renal injury EXAM: RENAL / URINARY TRACT ULTRASOUND COMPLETE COMPARISON:  CT from earlier in the same day. FINDINGS: Right Kidney: Renal measurements: 9.4 x 4.3 x 6.5 cm. = volume: 135 mL. Echogenicity within normal limits. No mass or hydronephrosis visualized. Left Kidney: Renal  measurements: 10.5 x 4.6 x 5.3 cm. = volume: 136 mL. Echogenicity within normal limits. No mass or hydronephrosis visualized. Bladder: Appears normal for degree of bladder distention. Other: Considerable ascites is noted despite recent paracentesis. IMPRESSION: Normal-appearing kidneys. Large volume ascites despite recent paracentesis. Electronically Signed   By: Inez Catalina M.D.   On: 10/05/2020 19:06   US Paracentesis  Result Date: 10/06/2020 CLINICAL DATA:  Acute renal injury EXAM: RENAL / URINARY TRACT ULTRASOUND COMPLETE COMPARISON:  CT from earlier in the same day. FINDINGS: Right Kidney: Renal measurements: 9.4 x 4.3 x 6.5 cm. = volume: 135 mL. Echogenicity within normal limits. No mass or hydronephrosis visualized. Left Kidney: Renal measurements: 10.5 x 4.6 x 5.3 cm. = volume: 136 mL. Echogenicity within normal limits. No mass or hydronephrosis visualized. Bladder: Appears normal for degree of bladder distention. Other: Considerable ascites is noted despite recent paracentesis. IMPRESSION: Normal-appearing kidneys. Large volume ascites despite recent paracentesis. Electronically Signed   By: Inez Catalina M.D.   On: 10/05/2020 19:06   DG Chest Port 1 View  Result Date: 10/05/2020 CLINICAL DATA:  Questionable sepsis EXAM: PORTABLE CHEST 1 VIEW COMPARISON:  None. FINDINGS: The patient is rotated to the right. The cardiomediastinal silhouette is within normal limits, allowing for low lung volumes and AP technique. Lung volumes are low. There is no focal consolidation or pulmonary edema. There is no pleural effusion or pneumothorax. There is no acute osseous abnormality. IMPRESSION: Low lung volumes. Otherwise, no radiographic evidence of acute cardiopulmonary process. Electronically Signed   By: Valetta Mole M.D.   On: 10/05/2020 12:26   ECHOCARDIOGRAM COMPLETE  Result Date: 10/06/2020    ECHOCARDIOGRAM REPORT   Patient Name:   SHAMARIE CALL Date of Exam: 10/06/2020 Medical Rec #:  [id number]       Height:       67.0 in Accession #:    [id number]     Weight:       193.3 lb Date of Birth:  [birth year]      BSA:          1.993 m Patient Age:    89 years       BP:           94/71 mmHg Patient Gender: F              HR:           100 bpm. Exam Location:  Inpatient Procedure: 2D Echo, Cardiac Doppler and Color Doppler                       STAT ECHO Reported to: Dr Jenkins Rouge on 10/06/2020 1:26:00 PM. Indications:    Ascites [id number]  History:        Patient has no prior history of Echocardiogram examinations.                 Risk Factors:Hypertension. COVID-19 Positive.  Sonographer:    Tiffany Dance RVT Referring Phys: [id number] [place]  1. Ascites noted on sub costal images Would not appear to be cardiac related.  2. Left ventricular ejection fraction, by estimation, is  60 to 65%. The left ventricle has normal function. The left ventricle has no regional wall motion abnormalities. Left ventricular diastolic parameters are consistent with Grade I diastolic dysfunction (impaired relaxation).  3. Right ventricular systolic function is normal. The right ventricular size is normal. There is normal pulmonary artery systolic pressure.  4. Left atrial size was mildly dilated.  5. The mitral valve is abnormal. No evidence of mitral valve regurgitation. No evidence of mitral stenosis.  6. The aortic valve is tricuspid. There is mild calcification of the aortic valve. Aortic valve regurgitation is not visualized. Mild aortic valve sclerosis is present, with no evidence of aortic valve stenosis.  7. The inferior vena cava is normal in size with greater than 50% respiratory variability, suggesting right atrial pressure of 3 mmHg. FINDINGS  Left Ventricle: Left ventricular ejection fraction, by estimation, is 60 to 65%. The left ventricle has normal function. The left ventricle has no regional wall motion abnormalities. The left ventricular internal cavity size was normal in size. There is  no left  ventricular hypertrophy. Left ventricular diastolic parameters are consistent with Grade I diastolic dysfunction (impaired relaxation). Right Ventricle: The right ventricular size is normal. No increase in right ventricular wall thickness. Right ventricular systolic function is normal. There is normal pulmonary artery systolic pressure. The tricuspid regurgitant velocity is 2.02 m/s, and  with an assumed right atrial pressure of 3 mmHg, the estimated right ventricular systolic pressure is 66.4 mmHg. Left Atrium: Left atrial size was mildly dilated. Right Atrium: Right atrial size was normal in size. Pericardium: There is no evidence of pericardial effusion. Mitral Valve: The mitral valve is abnormal. There is mild thickening of the mitral valve leaflet(s). There is mild calcification of the mitral valve leaflet(s). Mild mitral annular calcification. No evidence of mitral valve regurgitation. No evidence of mitral valve stenosis. Tricuspid Valve: The tricuspid valve is normal in structure. Tricuspid valve regurgitation is mild . No evidence of tricuspid stenosis. Aortic Valve: The aortic valve is tricuspid. There is mild calcification of the aortic valve. Aortic valve regurgitation is not visualized. Mild aortic valve sclerosis is present, with no evidence of aortic valve stenosis. Pulmonic Valve: The pulmonic valve was normal in structure. Pulmonic valve regurgitation is not visualized. No evidence of pulmonic stenosis. Aorta: The aortic root is normal in size and structure. Venous: The inferior vena cava is normal in size with greater than 50% respiratory variability, suggesting right atrial pressure of 3 mmHg. IAS/Shunts: No atrial level shunt detected by color flow Doppler. Additional Comments: Ascites noted on sub costal images Would not appear to be cardiac related.  LEFT VENTRICLE PLAX 2D LVIDd:         3.70 cm  Diastology LVIDs:         2.30 cm  LV e' medial:    7.40 cm/s LV PW:         1.10 cm  LV E/e'  medial:  9.8 LV IVS:        1.00 cm  LV e' lateral:   10.40 cm/s LVOT diam:     2.10 cm  LV E/e' lateral: 6.9 LV SV:         63 LV SV Index:   31 LVOT Area:     3.46 cm  RIGHT VENTRICLE             IVC RV Basal diam:  2.30 cm     IVC diam: 1.80 cm RV S prime:     12.10 cm/s  TAPSE (M-mode): 2.0 cm LEFT ATRIUM             Index       RIGHT ATRIUM          Index LA diam:        3.80 cm 1.91 cm/m  RA Area:     8.79 cm LA Vol (A2C):   27.3 ml 13.70 ml/m RA Volume:   15.70 ml 7.88 ml/m LA Vol (A4C):   39.1 ml 19.62 ml/m LA Biplane Vol: 33.2 ml 16.66 ml/m  AORTIC VALVE LVOT Vmax:   130.00 cm/s LVOT Vmean:  81.300 cm/s LVOT VTI:    0.180 m  AORTA Ao Root diam: 3.20 cm Ao Asc diam:  3.30 cm MITRAL VALVE                TRICUSPID VALVE MV Area (PHT): 4.31 cm     TR Peak grad:   16.3 mmHg MV Decel Time: 176 msec     TR Vmax:        202.00 cm/s MV E velocity: 72.20 cm/s MV A velocity: 122.00 cm/s  SHUNTS MV E/A ratio:  0.59         Systemic VTI:  0.18 m                             Systemic Diam: 2.10 cm Jenkins Rouge MD Electronically signed by Jenkins Rouge MD Signature Date/Time: 10/06/2020/1:35:19 PM    Final    US Abdomen Limited RUQ (LIVER/GB)  Result Date: 10/05/2020 CLINICAL DATA:  Right upper quadrant abdominal pain EXAM: ULTRASOUND ABDOMEN LIMITED RIGHT UPPER QUADRANT COMPARISON:  10/05/2020 CT abdomen/pelvis FINDINGS: Gallbladder: Nondistended gallbladder is completely filled with a densely shadowing 3.6 cm gallstone. No gallbladder wall thickening. No sonographic Murphy's sign. Common bile duct: Diameter: 6 mm on limited views, top-normal Liver: Coarsened liver parenchyma with questionable liver surface irregularity, which may indicate cirrhosis. No liver mass demonstrated on these limited views. Portal vein is patent on color Doppler imaging with normal direction of blood flow towards the liver. Other: Large volume ascites in the upper abdomen. IMPRESSION: 1. Cholelithiasis.  No evidence of acute  cholecystitis. 2. Top normal caliber common bile duct, 6 mm diameter. 3. Coarsened liver parenchyma with questionable liver surface irregularity, which may indicate cirrhosis. No liver mass detected on these limited views. Suggest correlation with liver function tests. 4. Large volume upper abdominal ascites. Electronically Signed   By: Ilona Sorrel M.D.   On: 10/05/2020 14:37    Assessment / Plan:  29)  65 year old female with new onset abdominal distention with generalized abdominal pain. CTAP and RUQ imaging identified a large amount of ascites.  RUQ showed coarsened liver parenchyma with questionable liver surface irregularity which could indicate cirrhosis.  Less likely cirrhosis with normal LFTs, platelet count and INR. S/P paracentesis 8/15 removed 3.5 L peritoneal fluid, no evidence of SBP. SAAG was 0.4 which is not consistent with cirrhosis/portal hypertension and is concerning for inflammatory/infectious or malignant process. Peritoneal LDH elevated at 497 which also supports infectious vs malignant ascites. Peritoneal protein level elevated at 5.2.  ECHO 8/16 with normal RV function and LV EF 60 - 65%. Alk phos 93.  AST 13.  ALT 10. Total bili 0.8.  Albumin 3.2 -> 3.8.  Hepatitis B surface antigen negative.  Hep C antibody negative.  AFP pending. AMA, SMA, ANA and IgG levels pending.  -Continue IV Albumin 25GM IV Q 6  for now -Repeat paracentesis today, 6L max peritoneal fluid to be removed. Peritoneal fluid to include cell count and diff, gram stain, aerobic and anaerobic cultures, albumin level, protein level, cytology, TG, CEA, glucose, amylase, adenosine deaminase and mycobacteria culture -CA 19-9, CEA, CA 125 BNP -Dr. Lyndel Safe to review CTAP with radiologist, if the radiologist feels strongly that she has underlying liver cirrhosis, then would consider hepatic venous pressure gradient (HVPG) determination and transjugular liver Bx at the same time. -Chest CT without contrast to rule out lung  mass/malignancy  -Pain management  and IV fluids per the hospitalist  2) Iron deficiency anemia. CTAP without evidence of upper or lower GI mass and no splenomegaly.  Hg 11.6 -> 10.1 -> 9.7. MCV 83.6. Iron 19. Ferritin 511.  -IV iron -Clear liquid diet -NPO after midnight -EGD and colonoscopy to rule out upper/lower GI malignancy on Thurs 10/08/2020 benefits and risks discussed including risk with sedation, risk of bleeding, perforation and infection  -CBC in am  3) AKI. Renal sodium level 63, unlikely HRS. Urine protein level negative, unlikely nephrotic syndrome. Possibly due to hypotension, dehydration +/- Jardiance. Cr 3.34 -> 2.28 ->1.61 -> 1.55.   4)  Covid 19 +, asymptomatic  5)  Malnutrition, weight loss  -EGD and colonoscopy to rule out GI malignancy as noted above -Nutritional consult   6) DM II       Active Problems:   Cirrhosis of liver ([place])   Cirrhosis ([place])     LOS: 2 days   Noralyn Pick  10/07/2020, 08:39 AM   Attending physician's note   I have taken an interval history, reviewed the chart and examined the patient. I agree with the Advanced Practitioner's note, impression and recommendations.   S/P another paracentesis EGD/colon on AM Studies pending  Carmell Austria, MD [facility] [phone]

## 2020-10-07 NOTE — Progress Notes (Signed)
PROGRESS NOTE    Beth Sanchez  HYQ:[id number] DOB: 11/03/1955 DOA: 10/05/2020 PCP: Pcp, No (Confirm with patient/family/NH records and if not entered, this HAS to be entered at [facility] point of entry. "No PCP" if truly none.)   Chief Complaint  Patient presents with   Abdominal Pain    Vomiting, diarrhea    Brief Narrative:  65 year old F with PMH of HTN, IDDM-2 and obesity presenting with increasing abdominal pain, distention, lower back pain, emesis and fatigue, and found to have liver cirrhosis with ascites, AKI, hypotension, mild hyponatremia, and COVID-19 infection.  She had IR perform paracentesis with removal of 3.5 L.  Had leukocytosis to 15.2.  Started on IV ceftriaxone until SBP is excluded.  Cr 3.34.  She had no respiratory symptoms in regards to a COVID-19 infection.  GI consulted.   Patient never had colonoscopy.  No history of GI bleed.  Quit drinking about 30 years ago.     Assessment & Plan:   Active Problems:   Cirrhosis of liver (HCC)   Cirrhosis ([place])  #1 large abdominal ascites/??  New onset liver cirrhosis -Patient presenting with abdominal distention, CT abdomen and pelvis as well as abdominal ultrasound consistent with large volume ascites. -Status post paracentesis with 3.5 L of fluid removed. -Patient with aSAAG < 1.1 arguing against portal hypertension. -Body fluid cultures with no growth to date with low probability for SBP -Leukocytosis trending down. -No significant exquisite tenderness to palpation. -Peritoneal fluid LDH elevated. -Patient seems to have had some reaccumulation of abdominal ascites. -Patient for repeat ultrasound-guided paracentesis to be done today. -CT chest to rule out lung mass/malignancy ordered per GI, CA 19-9, CEA, CA125 also ordered and pending. -Continue scheduled IV albumin through today, IV Rocephin. -GI following and appreciate input and recommendations.  2.  Acute kidney injury -Likely secondary to prerenal azotemia  from hypotension in the setting of Zestoretic, Jardiance. -Fractional excretion of sodium of 0.9% suggesting a prerenal etiology. -Urinalysis with trace leukocytes, nitrite negative, negative protein arguing against nephrotic syndrome. -Zestoretic on hold. -Urine output not properly recorded. -Renal function improving with creatinine down to 1.55. -Renal ultrasound negative for hydronephrosis. -Monitor urine output, avoid nephrotoxic agents. -Patient on IV albumin. -Follow.  3.  Hypotension -In the setting of cirrhosis with ascites and dehydration from GI loss. -BP improved however somewhat soft on IV albumin. -Continue to hold antihypertensive medications. -Patient pancultured with blood cultures pending with no growth to date. -Peritoneal fluid cultures negative to date. -Urine cultures with 10,000 colonies of Staphylococcus stimulants likely of no significance. -Blood cultures with no growth to date. -Continue IV albumin through today. -Supportive care.  Follow.  4.  Hyperkalemia -Status post Lokelma. -Resolved.  5.  Hyponatremia -Likely secondary to hypervolemia due to concerns for cirrhosis and abdominal ascites in the setting of Zestoretic. -Zestoretic on hold. -Sodium levels improving currently at 136.  6.  Non-anion gap metabolic acidosis likely from AKI -Improved.  7.  Incidental COVID infection -Patient noted to have tested positive on 10/05/2020. -Patient noted to be vaccinated. -No indication for treatment at this time. -Continue isolation precautions for total of 10 days.  8.  Non-insulin-dependent diabetes mellitus type 2 -Noted to be on Jardiance and metformin prior to admission which are currently on hold in the setting of AKI. -Hemoglobin A1c 5.9 (10/07/2020) -CBG 81 this morning. -Continue to hold oral hypoglycemic agents of metformin and Jardiance. -SSI.  9.  Iron deficiency anemia -Anemia panel with iron level of 19, ferritin of 511. -  Hemoglobin  currently at 9.7 from 11.6 on admission. -Patient currently on clear liquid diet. -IV iron ordered per GI. -Patient for upper endoscopy and colonoscopy to rule out upper and lower GI malignancy to be done 10/08/2020 per GI. -Transfusion threshold hemoglobin < 7.   10.  Elevated D-dimer -Felt likely secondary to COVID-19 infection. -Continue DVT prophylaxis.   DVT prophylaxis: Heparin Code Status: Full Family Communication: Updated patient.  No family at bedside. Disposition:   Status is: Inpatient  Remains inpatient appropriate because:Inpatient level of care appropriate due to severity of illness  Dispo: The patient is from: Home              Anticipated d/c is to: Home              Patient currently is not medically stable to d/c.   Difficult to place patient No       Consultants:  Gastroenterology: Dr. Lyndel Safe 10/06/2020  Procedures:  Renal ultrasound 10/05/2020 Right upper quadrant abdominal ultrasound 10/05/2020 Chest x-ray 10/05/2020 CT abdomen and pelvis 10/05/2020 2D echo 10/06/2020 Ultrasound-guided paracentesis pending 10/07/2020 Ultrasound-guided paracentesis 10/05/2020 per Darrell Allred, PA IR with 3.5 L of hazy fluid removed.   Antimicrobials: IV Rocephin 10/05/2020>>>>>> IV Flagyl 10/05/2020 x 1 dose   Subjective: C/o abd discomfort and aching pain in RLQ relived with oxy last night.  No chest pain.  No shortness of breath.  Overall feeling better than she did on admission.  Awaiting repeat paracentesis.  Objective: Vitals:   10/06/20 1034 10/06/20 1225 10/06/20 2016 10/07/20 0500  BP: 94/71 103/76 112/82 108/79  Pulse: 98 (!) 105 (!) 102 96  Resp: '18 20 14 20  ' Temp:  97.6 F (36.4 C) 98 F (36.7 C)   TempSrc:  Oral Oral   SpO2:  99% 100%   Weight:      Height:        Intake/Output Summary (Last 24 hours) at 10/07/2020 1101 Last data filed at 10/06/2020 1600 Gross per 24 hour  Intake 390 ml  Output --  Net 390 ml   Filed Weights   10/06/20 0000   Weight: 87.7 kg    Examination:  General exam: Appears calm and comfortable  Respiratory system: Clear to auscultation bilaterally, no wheezes, no crackles, no rhonchi. Respiratory effort normal. Cardiovascular system: S1 & S2 heard, RRR. No JVD, murmurs, rubs, gallops or clicks.  1-2+ bilateral lower extremity edema. Gastrointestinal system: Abdomen is distended, soft, some diffuse TTP in upper abd region.  No rebound.  No guarding.  Positive bowel sounds.  Central nervous system: Alert and oriented. No focal neurological deficits. Extremities: 1-2 + BLE edema.  Skin: No rashes, lesions or ulcers Psychiatry: Judgement and insight appear normal. Mood & affect appropriate.     Data Reviewed: I have personally reviewed following labs and imaging studies  CBC: Recent Labs  Lab 10/05/20 1136 10/06/20 0339 10/07/20 0407  WBC 6.6 6.6 5.0  NEUTROABS 5.9  --   --   HGB 11.6* 10.1* 9.7*  HCT 36.4 32.2* 30.6*  MCV 82.9 84.3 83.6  PLT 375 281 372    Basic Metabolic Panel: Recent Labs  Lab 10/05/20 1136 10/06/20 0339 10/07/20 0407  NA 131* 133* 136  136  K 5.2* 5.4* 4.6  4.6  CL 101 103 105  106  CO2 18* 18* 21*  20*  GLUCOSE 96 71 92  94  BUN 93* 77* 65*  65*  CREATININE 3.34* 2.28* 1.55*  1.61*  CALCIUM 9.0  9.0 9.3  9.4  MG  --  2.1 2.1  PHOS  --  4.6 3.1    GFR: Estimated Creatinine Clearance: 41.1 mL/min (A) (by C-G formula based on SCr of 1.55 mg/dL (H)).  Liver Function Tests: Recent Labs  Lab 10/05/20 1136 10/06/20 0339 10/07/20 0407  AST 16 13* 13*  ALT '12 10 9  ' ALKPHOS 122 93 75  BILITOT 0.7 0.8 0.8  PROT 7.4 6.5 6.6  ALBUMIN 3.0* 3.2* 3.8  3.8    CBG: Recent Labs  Lab 10/06/20 0800 10/06/20 1151 10/06/20 1645 10/06/20 2020 10/07/20 0802  GLUCAP 87 80 83 83 81     Recent Results (from the past 240 hour(s))  Blood Culture (routine x 2)     Status: None (Preliminary result)   Collection Time: 10/05/20 11:36 AM   Specimen: BLOOD   Result Value Ref Range Status   Specimen Description   Final    BLOOD LEFT ANTECUBITAL Performed at [facility], [place] [address] [place], [place] [address]    Special Requests   Final    BOTTLES DRAWN AEROBIC AND ANAEROBIC Blood Culture results may not be optimal due to an inadequate volume of blood received in culture bottles Performed at [place] [address], [place], [place] [address]    Culture   Final    NO GROWTH 1 DAY Performed at [facility], [place] [address], [place], [place] [address]    Report Status PENDING  Incomplete  Resp Panel by RT-PCR (Flu A&B, Covid) Nasopharyngeal Swab     Status: Abnormal   Collection Time: 10/05/20 12:00 PM   Specimen: Nasopharyngeal Swab; Nasopharyngeal(NP) swabs in vial transport medium  Result Value Ref Range Status   SARS Coronavirus 2 by RT PCR POSITIVE (A) NEGATIVE Final    Comment: RESULT CALLED TO, READ BACK BY AND VERIFIED WITH: Halina Maidens, RN 10/05/20 1446 KDS (NOTE) SARS-CoV-2 target nucleic acids are DETECTED.  The SARS-CoV-2 RNA is generally detectable in upper respiratory specimens during the acute phase of infection. Positive results are indicative of the presence of the identified virus, but do not rule out bacterial infection or co-infection with other pathogens not detected by the test. Clinical correlation with patient history and other diagnostic information is necessary to determine patient infection status. The expected result is Negative.  Fact Sheet for Patients: EntrepreneurPulse.com.au  Fact Sheet for Healthcare Providers: IncredibleEmployment.be  This test is not yet approved or cleared by the [place] FDA and  has been authorized for detection and/or diagnosis of SARS-CoV-2 by FDA under an Emergency Use Authorization (EUA).  This EUA will remain in effect (meaning this test can be u sed) for the duration of  the  COVID-19 declaration under Section 564(b)(1) of the Act, 21 U.S.C. section 360bbb-3(b)(1), unless the authorization is terminated or revoked sooner.     Influenza A by PCR NEGATIVE NEGATIVE Final   Influenza B by PCR NEGATIVE NEGATIVE Final    Comment: (NOTE) The Xpert Xpress SARS-CoV-2/FLU/RSV plus assay is intended as an aid in the diagnosis of influenza from Nasopharyngeal swab specimens and should not be used as a sole basis for treatment. Nasal washings and aspirates are unacceptable for Xpert Xpress SARS-CoV-2/FLU/RSV testing.  Fact Sheet for Patients: EntrepreneurPulse.com.au  Fact Sheet for Healthcare Providers: IncredibleEmployment.be  This test is not yet approved or cleared by the [place] FDA and has been authorized for detection and/or diagnosis of SARS-CoV-2 by FDA under an Emergency  Use Authorization (EUA). This EUA will remain in effect (meaning this test can be used) for the duration of the COVID-19 declaration under Section 564(b)(1) of the Act, 21 U.S.C. section 360bbb-3(b)(1), unless the authorization is terminated or revoked.  Performed at [facility], [place] [address] [place], [place] [address]   Blood Culture (routine x 2)     Status: None (Preliminary result)   Collection Time: 10/05/20 12:00 PM   Specimen: BLOOD  Result Value Ref Range Status   Specimen Description   Final    BLOOD RIGHT ANTECUBITAL Performed at [place] [address], [place], [place] [address]    Special Requests   Final    BOTTLES DRAWN AEROBIC AND ANAEROBIC Blood Culture results may not be optimal due to an inadequate volume of blood received in culture bottles Performed at [place] [address], [place], [place] [address]    Culture   Final    NO GROWTH < 24 HOURS Performed at [place] [address], [place], [place] [address]    Report Status PENDING   Incomplete  Body fluid culture w Gram Stain     Status: None (Preliminary result)   Collection Time: 10/05/20  3:07 PM   Specimen: Peritoneal Cavity; Peritoneal Fluid  Result Value Ref Range Status   Specimen Description   Final    PERITONEAL CAVITY Performed at [place] [address], [place], [place] [address]    Special Requests   Final    NONE Performed at [facility], [place] [address] [place], [place] [address]    Gram Stain   Final    NO SQUAMOUS EPITHELIAL CELLS SEEN FEW WBC SEEN NO ORGANISMS SEEN    Culture   Final    NO GROWTH 2 DAYS Performed at [facility], [place] [address] [place], [place] [address]    Report Status PENDING  Incomplete  Culture, body fluid w Gram Stain-bottle     Status: None (Preliminary result)   Collection Time: 10/05/20  5:02 PM   Specimen: Fluid  Result Value Ref Range Status   Specimen Description FLUID RIGHT PERITONEAL  Final   Special Requests   Final    BOTTLES DRAWN AEROBIC AND ANAEROBIC Blood Culture adequate volume   Culture   Final    NO GROWTH < 24 HOURS Performed at [facility], [place] [address], [place], [place] [address]    Report Status PENDING  Incomplete  Gram stain     Status: None   Collection Time: 10/05/20  5:02 PM   Specimen: Fluid  Result Value Ref Range Status   Specimen Description FLUID RIGHT PERITONEAL  Final   Special Requests NONE  Final   Gram Stain   Final    RARE SQUAMOUS EPITHELIAL CELLS PRESENT RARE WBC SEEN NO ORGANISMS SEEN Performed at [facility], [place] [address] [place], [place] [address]    Report Status 10/06/2020 FINAL  Final  Urine Culture     Status: None (Preliminary result)   Collection Time: 10/05/20  9:19 PM   Specimen: In/Out Cath Urine  Result Value Ref Range Status   Specimen Description   Final    IN/OUT CATH URINE Performed at [place] [address] [place], [place] [address]    Special  Requests   Final    NONE Performed at [facility], [place] [address]  [place], [place] [address]    Culture   Final    CULTURE REINCUBATED FOR BETTER GROWTH Performed at [facility], [place] [address] [place], [place] [address]    Report Status PENDING  Incomplete         Radiology Studies: CT ABDOMEN PELVIS WO CONTRAST  Result Date: 10/05/2020 CLINICAL DATA:  Abdominal distension and pain. Vomiting and diarrhea. Sepsis. EXAM: CT ABDOMEN AND PELVIS WITHOUT CONTRAST TECHNIQUE: Multidetector CT imaging of the abdomen and pelvis was performed following the standard protocol without IV contrast. COMPARISON:  None. FINDINGS: Lower chest: No acute findings. Hepatobiliary: No mass visualized on this unenhanced exam. 2 cm gallstone is noted, however there are no signs of acute cholecystitis or biliary ductal dilatation. Pancreas: No mass or inflammatory process visualized on this unenhanced exam. Spleen:  Within normal limits in size. Adrenals/Urinary tract: No evidence of urolithiasis or hydronephrosis. Unremarkable unopacified urinary bladder. Stomach/Bowel: No evidence of obstruction, inflammatory process, or abnormal fluid collections. Diverticulosis is seen mainly involving the sigmoid colon, however there is no evidence of diverticulitis. Vascular/Lymphatic: No pathologically enlarged lymph nodes identified. No evidence of abdominal aortic aneurysm. Aortic atherosclerotic calcification noted. Reproductive:  No mass or other significant abnormality. Other: Large amount of ascites is seen throughout the abdomen and pelvis. Musculoskeletal:  No suspicious bone lesions identified. IMPRESSION: Large amount of ascites. Cholelithiasis. No radiographic evidence of acute cholecystitis. Mild sigmoid diverticulosis, without radiographic evidence of diverticulitis. Aortic Atherosclerosis (ICD10-I70.0). Electronically Signed   By: Marlaine Hind M.D.   On: 10/05/2020 13:29   US  RENAL  Result Date: 10/06/2020 CLINICAL DATA:  Acute renal injury EXAM: RENAL / URINARY TRACT ULTRASOUND COMPLETE COMPARISON:  CT from earlier in the same day. FINDINGS: Right Kidney: Renal measurements: 9.4 x 4.3 x 6.5 cm. = volume: 135 mL. Echogenicity within normal limits. No mass or hydronephrosis visualized. Left Kidney: Renal measurements: 10.5 x 4.6 x 5.3 cm. = volume: 136 mL. Echogenicity within normal limits. No mass or hydronephrosis visualized. Bladder: Appears normal for degree of bladder distention. Other: Considerable ascites is noted despite recent paracentesis. IMPRESSION: Normal-appearing kidneys. Large volume ascites despite recent paracentesis. Electronically Signed   By: Inez Catalina M.D.   On: 10/05/2020 19:06   US Paracentesis  Result Date: 10/06/2020 CLINICAL DATA:  Acute renal injury EXAM: RENAL / URINARY TRACT ULTRASOUND COMPLETE COMPARISON:  CT from earlier in the same day. FINDINGS: Right Kidney: Renal measurements: 9.4 x 4.3 x 6.5 cm. = volume: 135 mL. Echogenicity within normal limits. No mass or hydronephrosis visualized. Left Kidney: Renal measurements: 10.5 x 4.6 x 5.3 cm. = volume: 136 mL. Echogenicity within normal limits. No mass or hydronephrosis visualized. Bladder: Appears normal for degree of bladder distention. Other: Considerable ascites is noted despite recent paracentesis. IMPRESSION: Normal-appearing kidneys. Large volume ascites despite recent paracentesis. Electronically Signed   By: Inez Catalina M.D.   On: 10/05/2020 19:06   DG Chest Port 1 View  Result Date: 10/05/2020 CLINICAL DATA:  Questionable sepsis EXAM: PORTABLE CHEST 1 VIEW COMPARISON:  None. FINDINGS: The patient is rotated to the right. The cardiomediastinal silhouette is within normal limits, allowing for low lung volumes and AP technique. Lung volumes are low. There is no focal consolidation or pulmonary edema. There is no pleural effusion or pneumothorax. There is no acute osseous abnormality.  IMPRESSION: Low lung volumes. Otherwise, no radiographic evidence of acute cardiopulmonary process. Electronically Signed   By: Valetta Mole M.D.   On: 10/05/2020 12:26   ECHOCARDIOGRAM  COMPLETE  Result Date: 10/06/2020    ECHOCARDIOGRAM REPORT   Patient Name:   MARIYANA FULOP Date of Exam: 10/06/2020 Medical Rec #:  [id number]      Height:       67.0 in Accession #:    [id number]     Weight:       193.3 lb Date of Birth:  10/05/1955      BSA:          1.993 m Patient Age:    18 years       BP:           94/71 mmHg Patient Gender: F              HR:           100 bpm. Exam Location:  Inpatient Procedure: 2D Echo, Cardiac Doppler and Color Doppler                       STAT ECHO Reported to: Dr Jenkins Rouge on 10/06/2020 1:26:00 PM. Indications:    Ascites [id number]  History:        Patient has no prior history of Echocardiogram examinations.                 Risk Factors:Hypertension. COVID-19 Positive.  Sonographer:    Tiffany Dance RVT Referring Phys: [id number] [place]  1. Ascites noted on sub costal images Would not appear to be cardiac related.  2. Left ventricular ejection fraction, by estimation, is 60 to 65%. The left ventricle has normal function. The left ventricle has no regional wall motion abnormalities. Left ventricular diastolic parameters are consistent with Grade I diastolic dysfunction (impaired relaxation).  3. Right ventricular systolic function is normal. The right ventricular size is normal. There is normal pulmonary artery systolic pressure.  4. Left atrial size was mildly dilated.  5. The mitral valve is abnormal. No evidence of mitral valve regurgitation. No evidence of mitral stenosis.  6. The aortic valve is tricuspid. There is mild calcification of the aortic valve. Aortic valve regurgitation is not visualized. Mild aortic valve sclerosis is present, with no evidence of aortic valve stenosis.  7. The inferior vena cava is normal in size with greater than 50%  respiratory variability, suggesting right atrial pressure of 3 mmHg. FINDINGS  Left Ventricle: Left ventricular ejection fraction, by estimation, is 60 to 65%. The left ventricle has normal function. The left ventricle has no regional wall motion abnormalities. The left ventricular internal cavity size was normal in size. There is  no left ventricular hypertrophy. Left ventricular diastolic parameters are consistent with Grade I diastolic dysfunction (impaired relaxation). Right Ventricle: The right ventricular size is normal. No increase in right ventricular wall thickness. Right ventricular systolic function is normal. There is normal pulmonary artery systolic pressure. The tricuspid regurgitant velocity is 2.02 m/s, and  with an assumed right atrial pressure of 3 mmHg, the estimated right ventricular systolic pressure is 37.1 mmHg. Left Atrium: Left atrial size was mildly dilated. Right Atrium: Right atrial size was normal in size. Pericardium: There is no evidence of pericardial effusion. Mitral Valve: The mitral valve is abnormal. There is mild thickening of the mitral valve leaflet(s). There is mild calcification of the mitral valve leaflet(s). Mild mitral annular calcification. No evidence of mitral valve regurgitation. No evidence of mitral valve stenosis. Tricuspid Valve: The tricuspid valve is normal in structure. Tricuspid valve regurgitation is mild . No evidence of tricuspid stenosis.  Aortic Valve: The aortic valve is tricuspid. There is mild calcification of the aortic valve. Aortic valve regurgitation is not visualized. Mild aortic valve sclerosis is present, with no evidence of aortic valve stenosis. Pulmonic Valve: The pulmonic valve was normal in structure. Pulmonic valve regurgitation is not visualized. No evidence of pulmonic stenosis. Aorta: The aortic root is normal in size and structure. Venous: The inferior vena cava is normal in size with greater than 50% respiratory variability, suggesting  right atrial pressure of 3 mmHg. IAS/Shunts: No atrial level shunt detected by color flow Doppler. Additional Comments: Ascites noted on sub costal images Would not appear to be cardiac related.  LEFT VENTRICLE PLAX 2D LVIDd:         3.70 cm  Diastology LVIDs:         2.30 cm  LV e' medial:    7.40 cm/s LV PW:         1.10 cm  LV E/e' medial:  9.8 LV IVS:        1.00 cm  LV e' lateral:   10.40 cm/s LVOT diam:     2.10 cm  LV E/e' lateral: 6.9 LV SV:         63 LV SV Index:   31 LVOT Area:     3.46 cm  RIGHT VENTRICLE             IVC RV Basal diam:  2.30 cm     IVC diam: 1.80 cm RV S prime:     12.10 cm/s TAPSE (M-mode): 2.0 cm LEFT ATRIUM             Index       RIGHT ATRIUM          Index LA diam:        3.80 cm 1.91 cm/m  RA Area:     8.79 cm LA Vol (A2C):   27.3 ml 13.70 ml/m RA Volume:   15.70 ml 7.88 ml/m LA Vol (A4C):   39.1 ml 19.62 ml/m LA Biplane Vol: 33.2 ml 16.66 ml/m  AORTIC VALVE LVOT Vmax:   130.00 cm/s LVOT Vmean:  81.300 cm/s LVOT VTI:    0.180 m  AORTA Ao Root diam: 3.20 cm Ao Asc diam:  3.30 cm MITRAL VALVE                TRICUSPID VALVE MV Area (PHT): 4.31 cm     TR Peak grad:   16.3 mmHg MV Decel Time: 176 msec     TR Vmax:        202.00 cm/s MV E velocity: 72.20 cm/s MV A velocity: 122.00 cm/s  SHUNTS MV E/A ratio:  0.59         Systemic VTI:  0.18 m                             Systemic Diam: 2.10 cm Jenkins Rouge MD Electronically signed by Jenkins Rouge MD Signature Date/Time: 10/06/2020/1:35:19 PM    Final    US Abdomen Limited RUQ (LIVER/GB)  Result Date: 10/05/2020 CLINICAL DATA:  Right upper quadrant abdominal pain EXAM: ULTRASOUND ABDOMEN LIMITED RIGHT UPPER QUADRANT COMPARISON:  10/05/2020 CT abdomen/pelvis FINDINGS: Gallbladder: Nondistended gallbladder is completely filled with a densely shadowing 3.6 cm gallstone. No gallbladder wall thickening. No sonographic Murphy's sign. Common bile duct: Diameter: 6 mm on limited views, top-normal Liver: Coarsened liver parenchyma with  questionable liver surface irregularity, which may  indicate cirrhosis. No liver mass demonstrated on these limited views. Portal vein is patent on color Doppler imaging with normal direction of blood flow towards the liver. Other: Large volume ascites in the upper abdomen. IMPRESSION: 1. Cholelithiasis.  No evidence of acute cholecystitis. 2. Top normal caliber common bile duct, 6 mm diameter. 3. Coarsened liver parenchyma with questionable liver surface irregularity, which may indicate cirrhosis. No liver mass detected on these limited views. Suggest correlation with liver function tests. 4. Large volume upper abdominal ascites. Electronically Signed   By: Ilona Sorrel M.D.   On: 10/05/2020 14:37        Scheduled Meds:  heparin  5,000 Units Subcutaneous Q12H   insulin aspart  0-9 Units Subcutaneous TID WC   pantoprazole  40 mg Oral Daily   peg 3350 powder  0.5 kit Oral Once   And   peg 3350 powder  0.5 kit Oral Once   simvastatin  20 mg Oral QHS   Continuous Infusions:  albumin human 25 g (10/07/20 0446)   cefTRIAXone (ROCEPHIN)  IV 2 g (10/06/20 1325)   ferric gluconate (FERRLECIT) IVPB       LOS: 2 days    Time spent: 35 minutes    Irine Seal, MD [facility]   To contact the attending provider between 7A-7P or the covering provider during after hours 7P-7A, please log into the web site [email or web address] and access using universal [place] password for that web site. If you do not have the password, please call the hospital operator.  10/07/2020, 11:01 AM

## 2020-10-08 ENCOUNTER — Encounter (HOSPITAL_COMMUNITY)
Admission: EM | Disposition: A | Discharge status: Skilled Nursing Facility | Payer: Self-pay | Source: Home / Self Care | Attending: Internal Medicine

## 2020-10-08 ENCOUNTER — Inpatient Hospital Stay (HOSPITAL_COMMUNITY): Payer: Medicare Other

## 2020-10-08 ENCOUNTER — Inpatient Hospital Stay (HOSPITAL_COMMUNITY): Payer: Medicare Other | Admitting: Certified Registered"

## 2020-10-08 DIAGNOSIS — K7460 Unspecified cirrhosis of liver: Secondary | ICD-10-CM | POA: Diagnosis not present

## 2020-10-08 DIAGNOSIS — D509 Iron deficiency anemia, unspecified: Secondary | ICD-10-CM

## 2020-10-08 DIAGNOSIS — K2100 Gastro-esophageal reflux disease with esophagitis, without bleeding: Secondary | ICD-10-CM | POA: Diagnosis not present

## 2020-10-08 DIAGNOSIS — I10 Essential (primary) hypertension: Secondary | ICD-10-CM | POA: Diagnosis not present

## 2020-10-08 DIAGNOSIS — R188 Other ascites: Secondary | ICD-10-CM | POA: Diagnosis not present

## 2020-10-08 DIAGNOSIS — Z538 Procedure and treatment not carried out for other reasons: Secondary | ICD-10-CM | POA: Diagnosis not present

## 2020-10-08 DIAGNOSIS — K2970 Gastritis, unspecified, without bleeding: Secondary | ICD-10-CM | POA: Diagnosis present

## 2020-10-08 DIAGNOSIS — N179 Acute kidney failure, unspecified: Secondary | ICD-10-CM | POA: Diagnosis not present

## 2020-10-08 DIAGNOSIS — R180 Malignant ascites: Secondary | ICD-10-CM | POA: Diagnosis not present

## 2020-10-08 HISTORY — PX: ESOPHAGOGASTRODUODENOSCOPY (EGD) WITH PROPOFOL: SHX5813

## 2020-10-08 HISTORY — PX: BIOPSY: SHX5522

## 2020-10-08 LAB — COMPREHENSIVE METABOLIC PANEL
ALT: 9 U/L (ref 0–44)
AST: 14 U/L — ABNORMAL LOW (ref 15–41)
Albumin: 3.9 g/dL (ref 3.5–5.0)
Alkaline Phosphatase: 73 U/L (ref 38–126)
Anion gap: 9 (ref 5–15)
BUN: 49 mg/dL — ABNORMAL HIGH (ref 8–23)
CO2: 20 mmol/L — ABNORMAL LOW (ref 22–32)
Calcium: 9.5 mg/dL (ref 8.9–10.3)
Chloride: 107 mmol/L (ref 98–111)
Creatinine, Ser: 1.23 mg/dL — ABNORMAL HIGH (ref 0.44–1.00)
GFR, Estimated: 49 mL/min — ABNORMAL LOW (ref >60)
Glucose, Bld: 81 mg/dL (ref 70–99)
Potassium: 4.6 mmol/L (ref 3.5–5.1)
Sodium: 136 mmol/L (ref 135–145)
Total Bilirubin: 1 mg/dL (ref 0.3–1.2)
Total Protein: 6.7 g/dL (ref 6.5–8.1)

## 2020-10-08 LAB — D-DIMER, QUANTITATIVE: D-Dimer, Quant: 3.61 ug/mL-FEU — ABNORMAL HIGH (ref 0.00–0.50)

## 2020-10-08 LAB — TRIGLYCERIDES, BODY FLUIDS: Triglycerides, Fluid: 38 mg/dL

## 2020-10-08 LAB — CBC WITH DIFFERENTIAL/PLATELET
Abs Immature Granulocytes: 0.03 10*3/uL (ref 0.00–0.07)
Basophils Absolute: 0 10*3/uL (ref 0.0–0.1)
Basophils Relative: 0 %
Eosinophils Absolute: 0.1 10*3/uL (ref 0.0–0.5)
Eosinophils Relative: 1 %
HCT: 34.4 % — ABNORMAL LOW (ref 36.0–46.0)
Hemoglobin: 10.9 g/dL — ABNORMAL LOW (ref 12.0–15.0)
Immature Granulocytes: 1 %
Lymphocytes Relative: 7 %
Lymphs Abs: 0.3 10*3/uL — ABNORMAL LOW (ref 0.7–4.0)
MCH: 26.7 pg (ref 26.0–34.0)
MCHC: 31.7 g/dL (ref 30.0–36.0)
MCV: 84.3 fL (ref 80.0–100.0)
Monocytes Absolute: 0.3 10*3/uL (ref 0.1–1.0)
Monocytes Relative: 6 %
Neutro Abs: 4.3 10*3/uL (ref 1.7–7.7)
Neutrophils Relative %: 85 %
Platelets: 237 10*3/uL (ref 150–400)
RBC: 4.08 MIL/uL (ref 3.87–5.11)
RDW: 15.9 % — ABNORMAL HIGH (ref 11.5–15.5)
WBC: 5.1 10*3/uL (ref 4.0–10.5)
nRBC: 0 % (ref 0.0–0.2)

## 2020-10-08 LAB — GLUCOSE, CAPILLARY
Glucose-Capillary: 78 mg/dL (ref 70–99)
Glucose-Capillary: 83 mg/dL (ref 70–99)
Glucose-Capillary: 80 mg/dL (ref 70–99)
Glucose-Capillary: 90 mg/dL (ref 70–99)
Glucose-Capillary: 96 mg/dL (ref 70–99)

## 2020-10-08 LAB — ANTI-SMOOTH MUSCLE ANTIBODY, IGG: F-Actin IgG: 14 Units (ref 0–19)

## 2020-10-08 LAB — BODY FLUID CULTURE W GRAM STAIN
Culture: NO GROWTH
Gram Stain: NONE SEEN

## 2020-10-08 LAB — FERRITIN: Ferritin: 666 ng/mL — ABNORMAL HIGH (ref 11–307)

## 2020-10-08 LAB — CERULOPLASMIN: Ceruloplasmin: 21.8 mg/dL (ref 19.0–39.0)

## 2020-10-08 LAB — URINE CULTURE: Culture: 10000 — AB

## 2020-10-08 LAB — ALPHA-1-ANTITRYPSIN: A-1 Antitrypsin, Ser: 173 mg/dL (ref 101–187)

## 2020-10-08 LAB — MAGNESIUM: Magnesium: 1.9 mg/dL (ref 1.7–2.4)

## 2020-10-08 LAB — IGG: IgG (Immunoglobin G), Serum: 886 mg/dL (ref 586–1602)

## 2020-10-08 LAB — CYTOLOGY - NON PAP

## 2020-10-08 LAB — ANA: Anti Nuclear Antibody (ANA): NEGATIVE

## 2020-10-08 LAB — C-REACTIVE PROTEIN: CRP: 11.6 mg/dL — ABNORMAL HIGH (ref <1.0)

## 2020-10-08 LAB — CA 125: Cancer Antigen (CA) 125: 7281 U/mL — ABNORMAL HIGH (ref 0.0–38.1)

## 2020-10-08 LAB — CANCER ANTIGEN 19-9: CA 19-9: 7 U/mL (ref 0–35)

## 2020-10-08 LAB — MITOCHONDRIAL ANTIBODIES: Mitochondrial M2 Ab, IgG: <20 Units (ref 0.0–20.0)

## 2020-10-08 LAB — CEA: CEA: 2.2 ng/mL (ref 0.0–4.7)

## 2020-10-08 SURGERY — ESOPHAGOGASTRODUODENOSCOPY (EGD) WITH PROPOFOL
Anesthesia: Monitor Anesthesia Care

## 2020-10-08 MED ORDER — PEG-KCL-NACL-NASULF-NA ASC-C 100 G PO SOLR
0.5000 | Freq: Once | ORAL | Status: AC
  Administered 2020-10-09: 100 g via ORAL

## 2020-10-08 MED ORDER — METOCLOPRAMIDE HCL 5 MG/ML IJ SOLN
10.0000 mg | Freq: Once | INTRAMUSCULAR | Status: AC
  Administered 2020-10-08: 10 mg via INTRAVENOUS
  Filled 2020-10-08: qty 2

## 2020-10-08 MED ORDER — PEG-KCL-NACL-NASULF-NA ASC-C 100 G PO SOLR: 1.0000 | Freq: Once | ORAL | Status: DC

## 2020-10-08 MED ORDER — LIDOCAINE 2% (20 MG/ML) 5 ML SYRINGE
INTRAMUSCULAR | Status: DC | PRN
  Administered 2020-10-08: 40 mg via INTRAVENOUS

## 2020-10-08 MED ORDER — SODIUM CHLORIDE 0.9 % IV SOLN: INTRAVENOUS | Status: DC

## 2020-10-08 MED ORDER — METOCLOPRAMIDE HCL 5 MG/ML IJ SOLN
10.0000 mg | Freq: Once | INTRAMUSCULAR | Status: AC
  Administered 2020-10-09: 10 mg via INTRAVENOUS
  Filled 2020-10-08: qty 2

## 2020-10-08 MED ORDER — PROPOFOL 10 MG/ML IV BOLUS
INTRAVENOUS | Status: DC | PRN
  Administered 2020-10-08: 60 mg via INTRAVENOUS

## 2020-10-08 MED ORDER — PANTOPRAZOLE SODIUM 40 MG IV SOLR
40.0000 mg | Freq: Two times a day (BID) | INTRAVENOUS | Status: DC
  Administered 2020-10-08 – 2020-10-29 (×43): 40 mg via INTRAVENOUS
  Filled 2020-10-08 (×43): qty 40

## 2020-10-08 MED ORDER — LACTATED RINGERS IV SOLN: INTRAVENOUS | Status: DC | PRN

## 2020-10-08 MED ORDER — ONDANSETRON HCL 4 MG/2ML IJ SOLN
INTRAMUSCULAR | Status: DC | PRN
  Administered 2020-10-08: 4 mg via INTRAVENOUS

## 2020-10-08 MED ORDER — PHENYLEPHRINE 40 MCG/ML (10ML) SYRINGE FOR IV PUSH (FOR BLOOD PRESSURE SUPPORT)
PREFILLED_SYRINGE | INTRAVENOUS | Status: DC | PRN
  Administered 2020-10-08: 120 ug via INTRAVENOUS
  Administered 2020-10-08: 80 ug via INTRAVENOUS

## 2020-10-08 MED ORDER — PHENYLEPHRINE HCL-NACL 20-0.9 MG/250ML-% IV SOLN
INTRAVENOUS | Status: DC | PRN
  Administered 2020-10-08: 15 ug/min via INTRAVENOUS

## 2020-10-08 MED ORDER — PEG-KCL-NACL-NASULF-NA ASC-C 100 G PO SOLR
0.5000 | Freq: Once | ORAL | Status: AC
  Administered 2020-10-08: 100 g via ORAL
  Filled 2020-10-08: qty 1

## 2020-10-08 MED ORDER — PROPOFOL 500 MG/50ML IV EMUL
INTRAVENOUS | Status: DC | PRN
  Administered 2020-10-08: 80 ug/kg/min via INTRAVENOUS

## 2020-10-08 MED ORDER — HYDROMORPHONE HCL 1 MG/ML IJ SOLN
0.5000 mg | INTRAMUSCULAR | Status: DC | PRN
  Administered 2020-10-08 – 2020-10-13 (×7): 0.5 mg via INTRAVENOUS
  Filled 2020-10-08 (×7): qty 0.5

## 2020-10-08 SURGICAL SUPPLY — 25 items

## 2020-10-08 NOTE — Op Note (Signed)
[facility] Patient Name: Beth Sanchez Procedure Date: 10/08/2020 MRN: [id number] Attending MD: Jackquline Denmark , MD Date of Birth: 1955-04-14 CSN: [id number] Age: 65 Admit Type: Inpatient Procedure:                Attempted colonoscopy Indications:              Iron deficiency anemia. Malignant ascites. Providers:                Jackquline Denmark, MD, Glori Bickers, RN, Cletis Athens,                            Technician, Cleda Daub, CRNA Referring MD:              Medicines:                Monitored Anesthesia Care Complications:            No immediate complications. Estimated Blood Loss:     Estimated blood loss: none. Procedure:                Pre-Anesthesia Assessment:                           - Prior to the procedure, a History and Physical                            was performed, and patient medications and                            allergies were reviewed. The patient's tolerance of                            previous anesthesia was also reviewed. The risks                            and benefits of the procedure and the sedation                            options and risks were discussed with the patient.                            All questions were answered, and informed consent                            was obtained. Prior Anticoagulants: The patient has                            taken no previous anticoagulant or antiplatelet                            agents. ASA Grade Assessment: III - A patient with                            severe systemic disease. After reviewing the risks  and benefits, the patient was deemed in                            satisfactory condition to undergo the procedure.                           After obtaining informed consent, the colonoscope                            was passed under direct vision. Throughout the                            procedure, the patient's blood pressure, pulse, and                             oxygen saturations were monitored continuously. The                            [id number]) Olympus colonoscope was                            introduced through the anus with the intention of                            advancing to the cecum. The scope was advanced to                            the sigmoid colon before the procedure was aborted.                            Medications were given. The colonoscopy was                            performed without difficulty. The patient tolerated                            the procedure well. The quality of the bowel                            preparation was inadequate. Scope In: 2:27:16 PM Scope Out: 2:31:21 PM Total Procedure Duration: 0 hours 4 minutes 4 seconds  Findings:      Significant retained stool in the rectum and distal sigmoid colon. Impression:               - Preparation of the colon was inadequate.                           - No specimens collected. Moderate Sedation:      Not Applicable - Patient had care per Anesthesia. Recommendation:           - Return patient to hospital ward for ongoing care.                           - Clear liquid diet.                           -  Reprep and repeat in AM.                           - Await oncology consultation.                           - The findings and recommendations were discussed                            with the patient. Procedure Code(s):        --- Professional ---                           [phone], 40, Colonoscopy, flexible; diagnostic,                            including collection of specimen(s) by brushing or                            washing, when performed (separate procedure) Diagnosis Code(s):        --- Professional ---                           D50.9, Iron deficiency anemia, unspecified CPT copyright 2019 American Medical Association. All rights reserved. The codes documented in this report are preliminary and upon coder review may  be  revised to meet current compliance requirements. Jackquline Denmark, MD 10/08/2020 2:46:33 PM This report has been signed electronically. Number of Addenda: 0

## 2020-10-08 NOTE — Anesthesia Preprocedure Evaluation (Signed)
Anesthesia Evaluation  Patient identified by MRN, date of birth, ID band Patient awake    Reviewed: Allergy & Precautions, NPO status , Patient's Chart, lab work & pertinent test results  Airway Mallampati: II  TM Distance: >3 FB Neck ROM: Full    Dental no notable dental hx.    Pulmonary neg pulmonary ROS,    Pulmonary exam normal breath sounds clear to auscultation       Cardiovascular hypertension, Normal cardiovascular exam Rhythm:Regular Rate:Normal     Neuro/Psych negative neurological ROS  negative psych ROS   GI/Hepatic negative GI ROS, (+) Cirrhosis   ascites    ,   Endo/Other  negative endocrine ROS  Renal/GU Renal InsufficiencyRenal disease  negative genitourinary   Musculoskeletal negative musculoskeletal ROS (+)   Abdominal   Peds negative pediatric ROS (+)  Hematology  (+) anemia ,   Anesthesia Other Findings   Reproductive/Obstetrics negative OB ROS                             Anesthesia Physical Anesthesia Plan  ASA: 3  Anesthesia Plan: MAC   Post-op Pain Management:    Induction: Intravenous  PONV Risk Score and Plan: 2 and Propofol infusion  Airway Management Planned: Simple Face Mask  Additional Equipment:   Intra-op Plan:   Post-operative Plan:   Informed Consent: I have reviewed the patients History and Physical, chart, labs and discussed the procedure including the risks, benefits and alternatives for the proposed anesthesia with the patient or authorized representative who has indicated his/her understanding and acceptance.     Dental advisory given  Plan Discussed with: CRNA and Surgeon  Anesthesia Plan Comments: (covid positive)        Anesthesia Quick Evaluation

## 2020-10-08 NOTE — Progress Notes (Addendum)
[facility] Progress Note  CC: New onset ascites  Subjective: She is anxious.  She was able to drink half of the first dose of MoviPrep yesterday evening.  She had significant nausea and felt full.  She received antiemetic and her nausea improved but she refused to drink any further bowel prep.  He is having intermittent right mid and left mid abdominal pain.  No current abdominal pain at this time.  Objective:  Vital signs in last 24 hours: Temp:  [98 F (36.7 C)-98.3 F (36.8 C)] 98 F (36.7 C) (08/18 0524) Pulse Rate:  [91-108] 91 (08/18 0524) Resp:  [17-20] 17 (08/18 0524) BP: (92-113)/(70-81) 113/81 (08/18 0524) SpO2:  [96 %-99 %] 96 % (08/18 0524) Last BM Date: 10/06/20 General:   Alert 65 year old female tearful in no acute distress.  Sitting up in the chair. Heart: Regular rate and rhythm, no murmurs. Pulm: Breath sounds clear throughout. Abdomen: Upper abdomen is distended with ascites.  Lower abdomen is nondistended.  Abdomen is not tense.  Nontender.  Positive bowel sounds to all 4 quadrants.   Extremities:  Without edema. Neurologic:  Alert and  oriented x4. Grossly normal neurologically. Psych:  Alert and cooperative. Normal mood and affect.  Intake/Output from previous day: 08/17 0701 - 08/18 0700 In: 590 [P.O.:340; IV Piggyback:250] Out: -  Intake/Output this shift: No intake/output data recorded.  Lab Results: Recent Labs    10/06/20 0339 10/07/20 0407 10/08/20 0506  WBC 6.6 5.0 5.1  HGB 10.1* 9.7* 10.9*  HCT 32.2* 30.6* 34.4*  PLT 281 275 237   BMET Recent Labs    10/06/20 0339 10/07/20 0407 10/08/20 0506  NA 133* 136  136 136  K 5.4* 4.6  4.6 4.6  CL 103 105  106 107  CO2 18* 21*  20* 20*  GLUCOSE 71 92  94 81  BUN 77* 65*  65* 49*  CREATININE 2.28* 1.55*  1.61* 1.23*  CALCIUM 9.0 9.3  9.4 9.5   LFT Recent Labs    10/08/20 0506  PROT 6.7  ALBUMIN 3.9  AST 14*  ALT 9  ALKPHOS 73  BILITOT 1.0    PT/INR Recent Labs    10/05/20 1136  LABPROT 13.9  INR 1.1   Hepatitis Panel Recent Labs    10/05/20 1834  HEPBSAG NON REACTIVE  HCVAB NON REACTIVE    CT CHEST WO CONTRAST  Result Date: 10/07/2020 CLINICAL DATA:  Unintentional weight loss. Clinical concern for malignancy. EXAM: CT CHEST WITHOUT CONTRAST TECHNIQUE: Multidetector CT imaging of the chest was performed following the standard protocol without IV contrast. COMPARISON:  Radiograph 10/05/2020 FINDINGS: Cardiovascular: Aortic atherosclerosis. Heart is normal in size. There are coronary artery calcifications. No pericardial effusion. Mediastinum/Nodes: No enlarged mediastinal lymph nodes. Limited assessment for hilar adenopathy in this unenhanced exam. Slightly patulous esophagus. No esophageal wall thickening. No thyroid nodule. There is no axillary adenopathy. Lungs/Pleura: Chest bilateral pleural effusions. Elevation of left hemidiaphragm with adjacent compressive atelectasis at the left lung base. No confluent airspace disease. There is no dominant pulmonary mass. There are tiny bilateral pulmonary nodules within both lungs, the majority of which are subpleural or perifissural. Most of these are in the right lung, and majority are millimetric, with largest in the right upper lobe measuring 3 mm series 7, image 38, and 3 mm perifissural left upper lobe series 7, image 43. Additional tiny nodules are seen in the right lung on series 7, images 18, 28, 83 and 95. The  trachea and central bronchi are patent. Upper Abdomen: Assessed on recent abdominopelvic CT. Moderate to large volume ascites again seen in the upper abdomen. Musculoskeletal: There are no acute or suspicious osseous abnormalities. No evidence of focal bone lesion or bone destruction. Multilevel degenerative change in the spine. Minimal chest wall soft tissue edema. No dominant breast mass or soft tissue abnormality. IMPRESSION: 1. Tiny bilateral pulmonary nodules, the  majority of which are subpleural or perifissural. Largest nodule measures 3 mm. Mild nonspecific, none of these nodules are specifically suspicious for malignancy. No follow-up needed if patient is low-risk (and has no known or suspected primary neoplasm). Non-contrast chest CT can be considered in 12 months if patient is high-risk. This recommendation follows the consensus statement: Guidelines for Management of Incidental Pulmonary Nodules Detected on CT Images: From the Fleischner Society 2017; Radiology 2017; 284:228-243. 2. No explanation for weight loss. No findings suspicious for intrathoracic malignancy. 3. Small bilateral pleural effusions. Elevation of left hemidiaphragm with adjacent compressive atelectasis at the left lung base. 4. Aortic atherosclerosis.  Coronary artery calcifications. Aortic Atherosclerosis (ICD10-I70.0). Electronically Signed   By: Keith Rake M.D.   On: 10/07/2020 19:55   US Paracentesis  Result Date: 10/07/2020 INDICATION: Patient with history of acute kidney injury, COVID-19, anemia, abdominal distension/recurrent ascites; request received for diagnostic and therapeutic paracentesis up to 6 liters. EXAM: ULTRASOUND GUIDED DIAGNOSTIC AND THERAPEUTIC  PARACENTESIS MEDICATIONS: 1% lidocaine to skin/SQ tissue COMPLICATIONS: None immediate. PROCEDURE: Informed written consent was obtained from the patient after a discussion of the risks, benefits and alternatives to treatment. A timeout was performed prior to the initiation of the procedure. Initial ultrasound scanning demonstrates a moderate amount of ascites within the right lower abdominal quadrant. The right lower abdomen was prepped and draped in the usual sterile fashion. 1% lidocaine was used for local anesthesia. Following this, a 19 gauge, 10-cm, Yueh catheter was introduced. An ultrasound image was saved for documentation purposes. The paracentesis was performed. The catheter was removed and a dressing was applied.  The patient tolerated the procedure well without immediate post procedural complication. Patient currently receiving IV albumin. FINDINGS: A total of approximately 3.3 liters of hazy,dark amber fluid was removed. Samples were sent to the laboratory as requested by the clinical team. IMPRESSION: Successful ultrasound-guided diagnostic and therapeutic paracentesis yielding 3.3 liters of peritoneal fluid. Read by: Rowe Robert, PA-C Electronically Signed   By: Miachel Roux M.D.   On: 10/07/2020 14:18   ECHOCARDIOGRAM COMPLETE  Result Date: 10/06/2020    ECHOCARDIOGRAM REPORT   Patient Name:   Beth Sanchez Date of Exam: 10/06/2020 Medical Rec #:  [id number]      Height:       67.0 in Accession #:    [id number]     Weight:       193.3 lb Date of Birth:  1955-04-27      BSA:          1.993 m Patient Age:    28 years       BP:           94/71 mmHg Patient Gender: F              HR:           100 bpm. Exam Location:  Inpatient Procedure: 2D Echo, Cardiac Doppler and Color Doppler                       STAT ECHO Reported to: Dr Collier Salina  Nishan on 10/06/2020 1:26:00 PM. Indications:    Ascites [id number]  History:        Patient has no prior history of Echocardiogram examinations.                 Risk Factors:Hypertension. COVID-19 Positive.  Sonographer:    Tiffany Dance RVT Referring Phys: [id number] [place]  1. Ascites noted on sub costal images Would not appear to be cardiac related.  2. Left ventricular ejection fraction, by estimation, is 60 to 65%. The left ventricle has normal function. The left ventricle has no regional wall motion abnormalities. Left ventricular diastolic parameters are consistent with Grade I diastolic dysfunction (impaired relaxation).  3. Right ventricular systolic function is normal. The right ventricular size is normal. There is normal pulmonary artery systolic pressure.  4. Left atrial size was mildly dilated.  5. The mitral valve is abnormal. No evidence of mitral valve  regurgitation. No evidence of mitral stenosis.  6. The aortic valve is tricuspid. There is mild calcification of the aortic valve. Aortic valve regurgitation is not visualized. Mild aortic valve sclerosis is present, with no evidence of aortic valve stenosis.  7. The inferior vena cava is normal in size with greater than 50% respiratory variability, suggesting right atrial pressure of 3 mmHg. FINDINGS  Left Ventricle: Left ventricular ejection fraction, by estimation, is 60 to 65%. The left ventricle has normal function. The left ventricle has no regional wall motion abnormalities. The left ventricular internal cavity size was normal in size. There is  no left ventricular hypertrophy. Left ventricular diastolic parameters are consistent with Grade I diastolic dysfunction (impaired relaxation). Right Ventricle: The right ventricular size is normal. No increase in right ventricular wall thickness. Right ventricular systolic function is normal. There is normal pulmonary artery systolic pressure. The tricuspid regurgitant velocity is 2.02 m/s, and  with an assumed right atrial pressure of 3 mmHg, the estimated right ventricular systolic pressure is 36.6 mmHg. Left Atrium: Left atrial size was mildly dilated. Right Atrium: Right atrial size was normal in size. Pericardium: There is no evidence of pericardial effusion. Mitral Valve: The mitral valve is abnormal. There is mild thickening of the mitral valve leaflet(s). There is mild calcification of the mitral valve leaflet(s). Mild mitral annular calcification. No evidence of mitral valve regurgitation. No evidence of mitral valve stenosis. Tricuspid Valve: The tricuspid valve is normal in structure. Tricuspid valve regurgitation is mild . No evidence of tricuspid stenosis. Aortic Valve: The aortic valve is tricuspid. There is mild calcification of the aortic valve. Aortic valve regurgitation is not visualized. Mild aortic valve sclerosis is present, with no evidence of  aortic valve stenosis. Pulmonic Valve: The pulmonic valve was normal in structure. Pulmonic valve regurgitation is not visualized. No evidence of pulmonic stenosis. Aorta: The aortic root is normal in size and structure. Venous: The inferior vena cava is normal in size with greater than 50% respiratory variability, suggesting right atrial pressure of 3 mmHg. IAS/Shunts: No atrial level shunt detected by color flow Doppler. Additional Comments: Ascites noted on sub costal images Would not appear to be cardiac related.  LEFT VENTRICLE PLAX 2D LVIDd:         3.70 cm  Diastology LVIDs:         2.30 cm  LV e' medial:    7.40 cm/s LV PW:         1.10 cm  LV E/e' medial:  9.8 LV IVS:  1.00 cm  LV e' lateral:   10.40 cm/s LVOT diam:     2.10 cm  LV E/e' lateral: 6.9 LV SV:         63 LV SV Index:   31 LVOT Area:     3.46 cm  RIGHT VENTRICLE             IVC RV Basal diam:  2.30 cm     IVC diam: 1.80 cm RV S prime:     12.10 cm/s TAPSE (M-mode): 2.0 cm LEFT ATRIUM             Index       RIGHT ATRIUM          Index LA diam:        3.80 cm 1.91 cm/m  RA Area:     8.79 cm LA Vol (A2C):   27.3 ml 13.70 ml/m RA Volume:   15.70 ml 7.88 ml/m LA Vol (A4C):   39.1 ml 19.62 ml/m LA Biplane Vol: 33.2 ml 16.66 ml/m  AORTIC VALVE LVOT Vmax:   130.00 cm/s LVOT Vmean:  81.300 cm/s LVOT VTI:    0.180 m  AORTA Ao Root diam: 3.20 cm Ao Asc diam:  3.30 cm MITRAL VALVE                TRICUSPID VALVE MV Area (PHT): 4.31 cm     TR Peak grad:   16.3 mmHg MV Decel Time: 176 msec     TR Vmax:        202.00 cm/s MV E velocity: 72.20 cm/s MV A velocity: 122.00 cm/s  SHUNTS MV E/A ratio:  0.59         Systemic VTI:  0.18 m                             Systemic Diam: 2.10 cm Jenkins Rouge MD Electronically signed by Jenkins Rouge MD Signature Date/Time: 10/06/2020/1:35:19 PM    Final     Assessment / Plan:  9)  65 year old female with new onset abdominal distention with generalized abdominal pain. CTAP and RUQ imaging identified a large  amount of ascites.  RUQ showed coarsened liver parenchyma with questionable liver surface irregularity which could indicate cirrhosis.  Less likely cirrhosis with normal LFTs, platelet count and INR. S/P paracentesis 8/15 removed 3.5 L peritoneal fluid, no evidence of SBP.  Peritoneal fluid culture negative. SAAG < 1.1 which is not consistent with cirrhosis/portal hypertension and is concerning for inflammatory/infectious or malignant process. Peritoneal LDH elevated at 497 which also supports infectious vs malignant ascites. Peritoneal protein level elevated at 5.2. Repeat paracentesis 8/17 removed 3.3L. No SBP. Cytology pending.  ECHO 8/16 with normal RV function and LV EF 60 - 65%. Alk phos 93.  AST 13.  ALT 10. Total bili 0.8.  Albumin 3.2 -> 3.8.  Hepatitis B surface antigen negative.  Hep C antibody negative.  IgG 886. Ceruloplasmin 21.8. CA 125 grossly elevated 7,281 concerning for ovarian malignancy. CA19-9 level 7. AFP pending. AMA, SMA and ANA pending.  -NPO -Continue IV fluids -Stop IV albumin  -Water enema x 2 prior to colonoscopy  -Proceed with EGD/colonoscopy as scheduled, if attempted colonoscopy today results in a poor prep she will require aggressive antiemetics with repeat bowel prep this evening and colonoscopy tomorrow -Await many peritoneal fluid labs including cytology  -Paracentesis x 2 without evidence of SBP, stop Rocephin unless she is on this for  a UTI -Recommend oncology consultation for elevated CA125 -Further recommendations per Dr. Lyndel Safe  2) Iron deficiency anemia. CTAP without evidence of upper or lower GI mass and no splenomegaly.  Chest CT negative. Hg 11.6 -> 10.1 -> 9.7 -> 10.9. Iron 19. Ferritin 511.  Received IV iron 8/18. -EGD and colonoscopy today as noted above -CBC in am   3) AKI, improving. Renal sodium level 63, unlikely HRS. Urine protein level negative, unlikely nephrotic syndrome. Possibly due to hypotension, dehydration +/- Jardiance. Cr 3.34 -> 2.28  ->1.61 -> 1.55 -> 1.23.    4)  Covid 19 +, asymptomatic   5)  Malnutrition, weight loss  -EGD and colonoscopy to rule out GI malignancy as noted above -Appreciate RD recommendations    6) DM II  7) UTI, urine cultures + for Staphylococcus simulans -Antibiotic tx per the hospitalist            Principal Problem:   Ascites Active Problems:   Essential hypertension   Hyperlipidemia associated with type 2 diabetes mellitus (HCC)   Type 2 diabetes mellitus with hyperglycemia (HCC)   Cirrhosis of liver (HCC)   Cirrhosis (HCC)   Iron deficiency anemia   AKI (acute kidney injury) ([place])   Hyperkalemia   Hyponatremia     LOS: 3 days   Noralyn Pick  10/08/2020, 9:44 AM   Attending physician's note   I have taken an interval history, reviewed the chart and examined the patient. I agree with the Advanced Practitioner's note, impression and recommendations.   Ascites cytology-malignant cells. ?Primary.  Special stains are pending. CA 125 > 7000 Neg CT chest Neg CT AP for any masses.  However, was done without contrast.  I could not visualize ovaries Neg EGD today.  Colonoscopy could not be performed d/t poor prep  For colon in a.m. Oncology consultation Await special stains May need repeat CT AP with contrast once Cr is better   Carmell Austria, MD [facility] [phone]

## 2020-10-08 NOTE — Op Note (Signed)
[facility] Patient Name: Beth Sanchez Procedure Date: 10/08/2020 MRN: [id number] Attending MD: Jackquline Denmark , MD Date of Birth: Sep 26, 1955 CSN: [id number] Age: 65 Admit Type: Inpatient Procedure:                Upper GI endoscopy Indications:              Epigastric abdominal pain, Iron deficiency anemia.                            Malignant ascites Providers:                Jackquline Denmark, MD, Glori Bickers, RN, Cletis Athens,                            Technician, Cleda Daub, CRNA Referring MD:              Medicines:                Monitored Anesthesia Care Complications:            No immediate complications. Estimated Blood Loss:     Estimated blood loss: none. Procedure:                Pre-Anesthesia Assessment:                           - Prior to the procedure, a History and Physical                            was performed, and patient medications and                            allergies were reviewed. The patient's tolerance of                            previous anesthesia was also reviewed. The risks                            and benefits of the procedure and the sedation                            options and risks were discussed with the patient.                            All questions were answered, and informed consent                            was obtained. Prior Anticoagulants: The patient has                            taken no previous anticoagulant or antiplatelet                            agents. ASA Grade Assessment: III - A patient with  severe systemic disease. After reviewing the risks                            and benefits, the patient was deemed in                            satisfactory condition to undergo the procedure.                           After obtaining informed consent, the endoscope was                            passed under direct vision. Throughout the                            procedure, the  patient's blood pressure, pulse, and                            oxygen saturations were monitored continuously. The                            [id number] Olympus endoscope was introduced                            through the mouth, and advanced to the second part                            of duodenum. The upper GI endoscopy was                            accomplished without difficulty. The patient                            tolerated the procedure well. Scope In: Scope Out: Findings:      LA Grade B (one or more mucosal breaks greater than 5 mm, not extending       between the tops of two mucosal folds) esophagitis with no bleeding was       found 34 to 35 cm from the incisors. Biopsies were taken with a cold       forceps for histology. No esophageal varices.      A small hiatal hernia was present.      Limited examination of the stomach d/t solid retained vegetable material       which would clog the suction channel of the scope. Scattered moderate       inflammation characterized by erythema was found in the entire examined       stomach. Biopsies were taken with a cold forceps for histology. No       outlet obstruction.      The examined duodenum was normal. Impression:               - LA Grade B reflux esophagitis with no bleeding.                            Biopsied.                           -  Small hiatal hernia.                           - Gastritis. Biopsied.                           - Limited examination of the stomach d/t retained                            solid food/fluid without mechanical outlet                            obstruction.                           - No obvious masses. Moderate Sedation:      Not Applicable - Patient had care per Anesthesia. Recommendation:           - Clear liquid diet.                           - Continue present medications including Protonix.                           - Await pathology results.                           -  The findings and recommendations were discussed                            with the patient. Procedure Code(s):        --- Professional ---                           [phone], Esophagogastroduodenoscopy, flexible,                            transoral; with biopsy, single or multiple Diagnosis Code(s):        --- Professional ---                           K21.00, Gastro-esophageal reflux disease with                            esophagitis, without bleeding                           K44.9, Diaphragmatic hernia without obstruction or                            gangrene                           K29.70, Gastritis, unspecified, without bleeding                           R10.13, Epigastric pain  D50.9, Iron deficiency anemia, unspecified CPT copyright 2019 [employer]. All rights reserved. The codes documented in this report are preliminary and upon coder review may  be revised to meet current compliance requirements. Jackquline Denmark, MD 10/08/2020 2:41:24 PM This report has been signed electronically. Number of Addenda: 0

## 2020-10-08 NOTE — Progress Notes (Signed)
First enema administered.

## 2020-10-08 NOTE — Progress Notes (Signed)
Patient attempted to drink bowel prep tonight but was unable to finish.  She felt nauseous and distressed.  She said she was having difficulty finishing the first container of bowel prep.  Zofran administered.  When following up with patient, she stated she was not going to be able to finish.  Patient was educated that the colonoscopy might not be able to be performed.  She complained of stomach pain as well.  Medication administered.  This nurse educated the patient again and patient declined to drink any more.  Night provider notified.  Will inform day shift.   Will continue to monitor.

## 2020-10-08 NOTE — Progress Notes (Signed)
PROGRESS NOTE    Beth Sanchez  TML:[id number] DOB: March 28, 1955 DOA: 10/05/2020 PCP: Pcp, No (Confirm with patient/family/NH records and if not entered, this HAS to be entered at [facility] point of entry. "No PCP" if truly none.)   Chief Complaint  Patient presents with   Abdominal Pain    Vomiting, diarrhea    Brief Narrative:  65 year old F with PMH of HTN, IDDM-2 and obesity presenting with increasing abdominal pain, distention, lower back pain, emesis and fatigue, and found to have liver cirrhosis with ascites, AKI, hypotension, mild hyponatremia, and COVID-19 infection.  She had IR perform paracentesis with removal of 3.5 L.  Had leukocytosis to 15.2.  Started on IV ceftriaxone until SBP is excluded.  Cr 3.34.  She had no respiratory symptoms in regards to a COVID-19 infection.  GI consulted.   Patient never had colonoscopy.  No history of GI bleed.  Quit drinking about 30 years ago.     Assessment & Plan:   Principal Problem:   Ascites Active Problems:   Essential hypertension   Hyperlipidemia associated with type 2 diabetes mellitus (HCC)   Type 2 diabetes mellitus with hyperglycemia (HCC)   Cirrhosis of liver (HCC)   Cirrhosis (HCC)   Iron deficiency anemia   AKI (acute kidney injury) ([place])   Hyperkalemia   Hyponatremia  #1 large abdominal ascites/??  New onset liver cirrhosis -Patient presenting with abdominal distention, CT abdomen and pelvis as well as abdominal ultrasound consistent with large volume ascites. -Status post paracentesis with 3.5 L of fluid removed. -Patient with aSAAG < 1.1 arguing against portal hypertension. -Body fluid cultures with no growth to date with low probability for SBP -Leukocytosis trending down. -No significant exquisite tenderness to palpation. -Peritoneal fluid LDH elevated. -Patient seems to have had some reaccumulation of abdominal ascites and subsequently underwent repeat ultrasound-guided paracentesis 10/07/2020 with 3.3 L of fluid  removed. -CT chest with tiny pulmonary nodules and no significant mass or malignancy noted.   -CA 125 elevated at 7281.   -CA 19-9 at 7, CEA at 2.2, AFP at 1.8  -Cytology from peritoneal fluid pending.   -Status post IV albumin.   -Patient was on IV Rocephin which we will discontinue as unlikely that patient has SBP.   -Patient for upper endoscopy and colonoscopy today.   -Due to elevated CA 125 levels, concern for malignant ascites likely from ovarian source. -Patient for colonoscopy and EGD today. -GI following and appreciate input and recommendations..  2.  Acute kidney injury -Likely secondary to prerenal azotemia from hypotension in the setting of Zestoretic, Jardiance. -Fractional excretion of sodium of 0.9% suggesting a prerenal etiology. -Urinalysis with trace leukocytes, nitrite negative, negative protein arguing against nephrotic syndrome. -Zestoretic on hold. -Urine output not properly recorded. -Renal function improving with creatinine down to 1.23 this morning -Renal ultrasound negative for hydronephrosis. -Monitor urine output, avoid nephrotoxic agents. -Gentle hydration. -Follow.  3.  Hypotension -In the setting of cirrhosis with ascites and dehydration from GI loss. -BP improved however somewhat soft on IV albumin. -Continue to hold antihypertensive medications. -Patient pancultured with blood cultures pending with no growth to date. -Peritoneal fluid cultures negative to date. -Urine cultures with 10,000 colonies of Staphylococcus stimulants likely of no significance. -Blood cultures with no growth to date. -Status post IV albumin. -Supportive care.  4.  Hyperkalemia -Status post Lokelma. -Resolved.  5.  Hyponatremia -Likely secondary to hypervolemia due to concerns for cirrhosis and abdominal ascites in the setting of Zestoretic. -Zestoretic on hold. -Sodium  levels currently at 136.    6.  Non-anion gap metabolic acidosis likely from AKI -Improved.  7.   Incidental COVID infection -Patient noted to have tested positive on 10/05/2020. -Patient noted to be vaccinated. -Patient not hypoxic with sats of 100% on room air -No indication for treatment at this time. -Continue isolation precautions for total of 10 days.  8.  Non-insulin-dependent diabetes mellitus type 2 -Noted to be on Jardiance and metformin prior to admission which are currently on hold in the setting of AKI. -Hemoglobin A1c 5.9 (10/07/2020) -CBG 80 this morning.  -Continue to hold oral hypoglycemic agents of metformin and Jardiance. -Discontinue SSI.  9.  Iron deficiency anemia -Anemia panel with iron level of 19, ferritin of 511. -Hemoglobin currently at 10.9 from 9.7 from 11.6 on admission. -Patient currently n.p.o. awaiting upper endoscopy and colonoscopy to be done today. -IV iron ordered per GI. -Transfusion threshold hemoglobin < 7.  -Per GI.  10.  Elevated D-dimer -Felt likely secondary to COVID-19 infection. -Continue DVT prophylaxis.  11.  Elevated CA 125 -Patient with elevated levels of CA125 of 7281. -Cytology from paracentesis pending. -Concern for ovarian malignancy. -Will need oncology evaluation.   DVT prophylaxis: Heparin Code Status: Full Family Communication: Updated patient.  No family at bedside. Disposition:   Status is: Inpatient  Remains inpatient appropriate because:Inpatient level of care appropriate due to severity of illness  Dispo: The patient is from: Home              Anticipated d/c is to: Home              Patient currently is not medically stable to d/c.   Difficult to place patient No       Consultants:  Gastroenterology: Dr. Lyndel Safe 10/06/2020  Procedures:  Renal ultrasound 10/05/2020 Right upper quadrant abdominal ultrasound 10/05/2020 Chest x-ray 10/05/2020 CT abdomen and pelvis 10/05/2020 2D echo 10/06/2020 Ultrasound-guided paracentesis -3.3 L of hazy dark amber fluid removed per IR, Darrell Allred, PA  10/07/2020 Ultrasound-guided paracentesis 10/05/2020 per Darrell Allred, PA IR with 3.5 L of hazy fluid removed. Upper endoscopy/colonoscopy pending CT chest 10/07/2020   Antimicrobials: IV Rocephin 10/05/2020>>>>>> 10/08/2020 IV Flagyl 10/05/2020 x 1 dose   Subjective: C/o significan diffuse abd pain after enema given this morning.. States after paracenthesis yesterday it improved pain. No SOB. No CP.   Objective: Vitals:   10/07/20 1341 10/07/20 1356 10/07/20 1722 10/08/20 0524  BP: 94/70 92/76 101/72 113/81  Pulse:   (!) 108 91  Resp:   20 17  Temp:   98.3 F (36.8 C) 98 F (36.7 C)  TempSrc:   Oral Oral  SpO2:   99% 96%  Weight:      Height:        Intake/Output Summary (Last 24 hours) at 10/08/2020 1153 Last data filed at 10/07/2020 2230 Gross per 24 hour  Intake 590 ml  Output --  Net 590 ml    Filed Weights   10/06/20 0000  Weight: 87.7 kg    Examination:  General exam: NAD Respiratory system: Lungs clear to auscultation bilaterally, no wheezes, no crackles, no rhonchi.  Normal respiratory effort.  Cardiovascular system: Regular rate rhythm no murmurs rubs or gallops.  No JVD.  1-2+ bilateral lower extremity edema. Gastrointestinal system: Distended, diffusely tender to palpation in the upper abdominal region.  Positive bowel sounds.  No rebound.  No guarding.  Central nervous system: Alert and oriented. No focal neurological deficits. Extremities: 1-2 + BLE edema.  Skin: No rashes, lesions or ulcers Psychiatry: Judgement and insight appear normal. Mood & affect appropriate.     Data Reviewed: I have personally reviewed following labs and imaging studies  CBC: Recent Labs  Lab 10/05/20 1136 10/06/20 0339 10/07/20 0407 10/08/20 0506  WBC 6.6 6.6 5.0 5.1  NEUTROABS 5.9  --   --  4.3  HGB 11.6* 10.1* 9.7* 10.9*  HCT 36.4 32.2* 30.6* 34.4*  MCV 82.9 84.3 83.6 84.3  PLT 375 281 275 237     Basic Metabolic Panel: Recent Labs  Lab 10/05/20 1136  10/06/20 0339 10/07/20 0407 10/08/20 0506  NA 131* 133* 136  136 136  K 5.2* 5.4* 4.6  4.6 4.6  CL 101 103 105  106 107  CO2 18* 18* 21*  20* 20*  GLUCOSE 96 71 92  94 81  BUN 93* 77* 65*  65* 49*  CREATININE 3.34* 2.28* 1.55*  1.61* 1.23*  CALCIUM 9.0 9.0 9.3  9.4 9.5  MG  --  2.1 2.1 1.9  PHOS  --  4.6 3.1  --      GFR: Estimated Creatinine Clearance: 51.8 mL/min (A) (by C-G formula based on SCr of 1.23 mg/dL (H)).  Liver Function Tests: Recent Labs  Lab 10/05/20 1136 10/06/20 0339 10/07/20 0407 10/08/20 0506  AST 16 13* 13* 14*  ALT '12 10 9 9  ' ALKPHOS 122 93 75 73  BILITOT 0.7 0.8 0.8 1.0  PROT 7.4 6.5 6.6 6.7  ALBUMIN 3.0* 3.2* 3.8  3.8 3.9     CBG: Recent Labs  Lab 10/06/20 2020 10/07/20 0802 10/07/20 1717 10/08/20 0403 10/08/20 0921  GLUCAP 83 81 83 78 83      Recent Results (from the past 240 hour(s))  Blood Culture (routine x 2)     Status: None (Preliminary result)   Collection Time: 10/05/20 11:36 AM   Specimen: BLOOD  Result Value Ref Range Status   Specimen Description   Final    BLOOD LEFT ANTECUBITAL Performed at [facility], [place] [address] [place], [place] [address]    Special Requests   Final    BOTTLES DRAWN AEROBIC AND ANAEROBIC Blood Culture results may not be optimal due to an inadequate volume of blood received in culture bottles Performed at [facility] [address] [place], [place] [address]    Culture   Final    NO GROWTH 2 DAYS Performed at [facility] [address] [place], [place] [address]    Report Status PENDING  Incomplete  Resp Panel by RT-PCR (Flu A&B, Covid) Nasopharyngeal Swab     Status: Abnormal   Collection Time: 10/05/20 12:00 PM   Specimen: Nasopharyngeal Swab; Nasopharyngeal(NP) swabs in vial transport medium  Result Value Ref Range Status   SARS Coronavirus 2 by RT PCR POSITIVE (A) NEGATIVE Final    Comment: RESULT CALLED TO, READ BACK BY  AND VERIFIED WITH: Halina Maidens, RN 10/05/20 1446 KDS (NOTE) SARS-CoV-2 target nucleic acids are DETECTED.  The SARS-CoV-2 RNA is generally detectable in upper respiratory specimens during the acute phase of infection. Positive results are indicative of the presence of the identified virus, but do not rule out bacterial infection or co-infection with other pathogens not detected by the test. Clinical correlation with patient history and other diagnostic information is necessary to determine patient infection status. The expected result is Negative.  Fact Sheet for Patients: EntrepreneurPulse.com.au  Fact Sheet for Healthcare Providers: IncredibleEmployment.be  This test  is not yet approved or cleared by the [place] and  has been authorized for detection and/or diagnosis of SARS-CoV-2 by FDA under an Emergency Use Authorization (EUA).  This EUA will remain in effect (meaning this test can be u sed) for the duration of  the COVID-19 declaration under Section 564(b)(1) of the Act, 21 U.S.C. section 360bbb-3(b)(1), unless the authorization is terminated or revoked sooner.     Influenza A by PCR NEGATIVE NEGATIVE Final   Influenza B by PCR NEGATIVE NEGATIVE Final    Comment: (NOTE) The Xpert Xpress SARS-CoV-2/FLU/RSV plus assay is intended as an aid in the diagnosis of influenza from Nasopharyngeal swab specimens and should not be used as a sole basis for treatment. Nasal washings and aspirates are unacceptable for Xpert Xpress SARS-CoV-2/FLU/RSV testing.  Fact Sheet for Patients: EntrepreneurPulse.com.au  Fact Sheet for Healthcare Providers: IncredibleEmployment.be  This test is not yet approved or cleared by the [place] FDA and has been authorized for detection and/or diagnosis of SARS-CoV-2 by FDA under an Emergency Use Authorization (EUA). This EUA will remain in effect (meaning this test  can be used) for the duration of the COVID-19 declaration under Section 564(b)(1) of the Act, 21 U.S.C. section 360bbb-3(b)(1), unless the authorization is terminated or revoked.  Performed at [facility], [place] [address], [place], [place] [address]   Blood Culture (routine x 2)     Status: None (Preliminary result)   Collection Time: 10/05/20 12:00 PM   Specimen: BLOOD  Result Value Ref Range Status   Specimen Description   Final    BLOOD RIGHT ANTECUBITAL Performed at [facility] [address] [place], [place] [address]    Special Requests   Final    BOTTLES DRAWN AEROBIC AND ANAEROBIC Blood Culture results may not be optimal due to an inadequate volume of blood received in culture bottles Performed at [facility] [address], [place], [place] [address]    Culture   Final    NO GROWTH 2 DAYS Performed at [facility] [address], [place], [place] [address]    Report Status PENDING  Incomplete  Body fluid culture w Gram Stain     Status: None   Collection Time: 10/05/20  3:07 PM   Specimen: Peritoneal Cavity; Peritoneal Fluid  Result Value Ref Range Status   Specimen Description   Final    PERITONEAL CAVITY Performed at [facility] [address] [place], [place] [address]    Special Requests   Final    NONE Performed at [facility], [place] [address] [place], [place] [address]    Gram Stain   Final    NO SQUAMOUS EPITHELIAL CELLS SEEN FEW WBC SEEN NO ORGANISMS SEEN    Culture   Final    NO GROWTH Performed at [facility], [place] [address], [place], [place] [address]    Report Status 10/08/2020 FINAL  Final  Culture, body fluid w Gram Stain-bottle     Status: None (Preliminary result)   Collection Time: 10/05/20  5:02 PM   Specimen: Fluid  Result Value Ref Range Status   Specimen Description FLUID RIGHT PERITONEAL  Final   Special Requests    Final    BOTTLES DRAWN AEROBIC AND ANAEROBIC Blood Culture adequate volume   Culture   Final    NO GROWTH 2 DAYS Performed at [facility], [place] [address] [place], [place]  [address]    Report Status PENDING  Incomplete  Gram stain     Status: None   Collection Time: 10/05/20  5:02 PM   Specimen: Fluid  Result Value Ref Range Status   Specimen Description FLUID RIGHT PERITONEAL  Final   Special Requests NONE  Final   Gram Stain   Final    RARE SQUAMOUS EPITHELIAL CELLS PRESENT RARE WBC SEEN NO ORGANISMS SEEN Performed at [facility], [place] [address], [place], [place] [address]    Report Status 10/06/2020 FINAL  Final  Urine Culture     Status: Abnormal   Collection Time: 10/05/20  9:19 PM   Specimen: In/Out Cath Urine  Result Value Ref Range Status   Specimen Description   Final    IN/OUT CATH URINE Performed at [facility] [address] [place], [place] [address]    Special Requests   Final    NONE Performed at [facility], [place] [address] [place], [place] [address]    Culture 10,000 COLONIES/mL STAPHYLOCOCCUS SIMULANS (A)  Final   Report Status 10/08/2020 FINAL  Final   Organism ID, Bacteria STAPHYLOCOCCUS SIMULANS (A)  Final      Susceptibility   Staphylococcus simulans - MIC*    CIPROFLOXACIN <=0.5 SENSITIVE Sensitive     GENTAMICIN <=0.5 SENSITIVE Sensitive     NITROFURANTOIN <=16 SENSITIVE Sensitive     OXACILLIN <=0.25 SENSITIVE Sensitive     TETRACYCLINE <=1 SENSITIVE Sensitive     VANCOMYCIN <=0.5 SENSITIVE Sensitive     TRIMETH/SULFA <=10 SENSITIVE Sensitive     CLINDAMYCIN <=0.25 SENSITIVE Sensitive     RIFAMPIN <=0.5 SENSITIVE Sensitive     Inducible Clindamycin NEGATIVE Sensitive     * 10,000 COLONIES/mL STAPHYLOCOCCUS SIMULANS  Body fluid culture w Gram Stain     Status: None (Preliminary result)   Collection Time: 10/07/20 12:40 PM   Specimen: Peritoneal Washings  Result Value Ref Range Status    Specimen Description   Final    PERITONEAL Performed at [facility] [address] [place], [place] [address]    Special Requests   Final    NONE Performed at [facility], [place] [address] [place], [place] [address]    Gram Stain   Final    RARE WBC PRESENT,BOTH PMN AND MONONUCLEAR NO ORGANISMS SEEN    Culture   Final    NO GROWTH < 12 HOURS Performed at [facility], [place] [address], [place], [place] [address]    Report Status PENDING  Incomplete          Radiology Studies: CT CHEST WO CONTRAST  Result Date: 10/07/2020 CLINICAL DATA:  Unintentional weight loss. Clinical concern for malignancy. EXAM: CT CHEST WITHOUT CONTRAST TECHNIQUE: Multidetector CT imaging of the chest was performed following the standard protocol without IV contrast. COMPARISON:  Radiograph 10/05/2020 FINDINGS: Cardiovascular: Aortic atherosclerosis. Heart is normal in size. There are coronary artery calcifications. No pericardial effusion. Mediastinum/Nodes: No enlarged mediastinal lymph nodes. Limited assessment for hilar adenopathy in this unenhanced exam. Slightly patulous esophagus. No esophageal wall thickening. No thyroid nodule. There is no axillary adenopathy. Lungs/Pleura: Chest bilateral pleural effusions. Elevation of left hemidiaphragm with adjacent compressive atelectasis at the left lung base. No confluent airspace disease. There is no dominant pulmonary mass. There are tiny bilateral pulmonary nodules within both lungs, the majority of which are subpleural or perifissural. Most of these are in the right lung, and majority are  millimetric, with largest in the right upper lobe measuring 3 mm series 7, image 38, and 3 mm perifissural left upper lobe series 7, image 43. Additional tiny nodules are seen in the right lung on series 7, images 18, 28, 83 and 95. The trachea and central bronchi are patent. Upper Abdomen: Assessed on recent abdominopelvic CT.  Moderate to large volume ascites again seen in the upper abdomen. Musculoskeletal: There are no acute or suspicious osseous abnormalities. No evidence of focal bone lesion or bone destruction. Multilevel degenerative change in the spine. Minimal chest wall soft tissue edema. No dominant breast mass or soft tissue abnormality. IMPRESSION: 1. Tiny bilateral pulmonary nodules, the majority of which are subpleural or perifissural. Largest nodule measures 3 mm. Mild nonspecific, none of these nodules are specifically suspicious for malignancy. No follow-up needed if patient is low-risk (and has no known or suspected primary neoplasm). Non-contrast chest CT can be considered in 12 months if patient is high-risk. This recommendation follows the consensus statement: Guidelines for Management of Incidental Pulmonary Nodules Detected on CT Images: From the Fleischner Society 2017; Radiology 2017; 284:228-243. 2. No explanation for weight loss. No findings suspicious for intrathoracic malignancy. 3. Small bilateral pleural effusions. Elevation of left hemidiaphragm with adjacent compressive atelectasis at the left lung base. 4. Aortic atherosclerosis.  Coronary artery calcifications. Aortic Atherosclerosis (ICD10-I70.0). Electronically Signed   By: Keith Rake M.D.   On: 10/07/2020 19:55   US Paracentesis  Result Date: 10/07/2020 INDICATION: Patient with history of acute kidney injury, COVID-19, anemia, abdominal distension/recurrent ascites; request received for diagnostic and therapeutic paracentesis up to 6 liters. EXAM: ULTRASOUND GUIDED DIAGNOSTIC AND THERAPEUTIC  PARACENTESIS MEDICATIONS: 1% lidocaine to skin/SQ tissue COMPLICATIONS: None immediate. PROCEDURE: Informed written consent was obtained from the patient after a discussion of the risks, benefits and alternatives to treatment. A timeout was performed prior to the initiation of the procedure. Initial ultrasound scanning demonstrates a moderate amount of  ascites within the right lower abdominal quadrant. The right lower abdomen was prepped and draped in the usual sterile fashion. 1% lidocaine was used for local anesthesia. Following this, a 19 gauge, 10-cm, Yueh catheter was introduced. An ultrasound image was saved for documentation purposes. The paracentesis was performed. The catheter was removed and a dressing was applied. The patient tolerated the procedure well without immediate post procedural complication. Patient currently receiving IV albumin. FINDINGS: A total of approximately 3.3 liters of hazy,dark amber fluid was removed. Samples were sent to the laboratory as requested by the clinical team. IMPRESSION: Successful ultrasound-guided diagnostic and therapeutic paracentesis yielding 3.3 liters of peritoneal fluid. Read by: Rowe Robert, PA-C Electronically Signed   By: Miachel Roux M.D.   On: 10/07/2020 14:18   ECHOCARDIOGRAM COMPLETE  Result Date: 10/06/2020    ECHOCARDIOGRAM REPORT   Patient Name:   Beth Sanchez Date of Exam: 10/06/2020 Medical Rec #:  [id number]      Height:       67.0 in Accession #:    [id number]     Weight:       193.3 lb Date of Birth:  Apr 25, 1955      BSA:          1.993 m Patient Age:    89 years       BP:           94/71 mmHg Patient Gender: F              HR:  100 bpm. Exam Location:  Inpatient Procedure: 2D Echo, Cardiac Doppler and Color Doppler                       STAT ECHO Reported to: Dr Jenkins Rouge on 10/06/2020 1:26:00 PM. Indications:    Ascites [id number]  History:        Patient has no prior history of Echocardiogram examinations.                 Risk Factors:Hypertension. COVID-19 Positive.  Sonographer:    Tiffany Dance RVT Referring Phys: [id number] Leonardo  1. Ascites noted on sub costal images Would not appear to be cardiac related.  2. Left ventricular ejection fraction, by estimation, is 60 to 65%. The left ventricle has normal function. The left ventricle has no regional  wall motion abnormalities. Left ventricular diastolic parameters are consistent with Grade I diastolic dysfunction (impaired relaxation).  3. Right ventricular systolic function is normal. The right ventricular size is normal. There is normal pulmonary artery systolic pressure.  4. Left atrial size was mildly dilated.  5. The mitral valve is abnormal. No evidence of mitral valve regurgitation. No evidence of mitral stenosis.  6. The aortic valve is tricuspid. There is mild calcification of the aortic valve. Aortic valve regurgitation is not visualized. Mild aortic valve sclerosis is present, with no evidence of aortic valve stenosis.  7. The inferior vena cava is normal in size with greater than 50% respiratory variability, suggesting right atrial pressure of 3 mmHg. FINDINGS  Left Ventricle: Left ventricular ejection fraction, by estimation, is 60 to 65%. The left ventricle has normal function. The left ventricle has no regional wall motion abnormalities. The left ventricular internal cavity size was normal in size. There is  no left ventricular hypertrophy. Left ventricular diastolic parameters are consistent with Grade I diastolic dysfunction (impaired relaxation). Right Ventricle: The right ventricular size is normal. No increase in right ventricular wall thickness. Right ventricular systolic function is normal. There is normal pulmonary artery systolic pressure. The tricuspid regurgitant velocity is 2.02 m/s, and  with an assumed right atrial pressure of 3 mmHg, the estimated right ventricular systolic pressure is 17.9 mmHg. Left Atrium: Left atrial size was mildly dilated. Right Atrium: Right atrial size was normal in size. Pericardium: There is no evidence of pericardial effusion. Mitral Valve: The mitral valve is abnormal. There is mild thickening of the mitral valve leaflet(s). There is mild calcification of the mitral valve leaflet(s). Mild mitral annular calcification. No evidence of mitral valve  regurgitation. No evidence of mitral valve stenosis. Tricuspid Valve: The tricuspid valve is normal in structure. Tricuspid valve regurgitation is mild . No evidence of tricuspid stenosis. Aortic Valve: The aortic valve is tricuspid. There is mild calcification of the aortic valve. Aortic valve regurgitation is not visualized. Mild aortic valve sclerosis is present, with no evidence of aortic valve stenosis. Pulmonic Valve: The pulmonic valve was normal in structure. Pulmonic valve regurgitation is not visualized. No evidence of pulmonic stenosis. Aorta: The aortic root is normal in size and structure. Venous: The inferior vena cava is normal in size with greater than 50% respiratory variability, suggesting right atrial pressure of 3 mmHg. IAS/Shunts: No atrial level shunt detected by color flow Doppler. Additional Comments: Ascites noted on sub costal images Would not appear to be cardiac related.  LEFT VENTRICLE PLAX 2D LVIDd:         3.70 cm  Diastology LVIDs:  2.30 cm  LV e' medial:    7.40 cm/s LV PW:         1.10 cm  LV E/e' medial:  9.8 LV IVS:        1.00 cm  LV e' lateral:   10.40 cm/s LVOT diam:     2.10 cm  LV E/e' lateral: 6.9 LV SV:         63 LV SV Index:   31 LVOT Area:     3.46 cm  RIGHT VENTRICLE             IVC RV Basal diam:  2.30 cm     IVC diam: 1.80 cm RV S prime:     12.10 cm/s TAPSE (M-mode): 2.0 cm LEFT ATRIUM             Index       RIGHT ATRIUM          Index LA diam:        3.80 cm 1.91 cm/m  RA Area:     8.79 cm LA Vol (A2C):   27.3 ml 13.70 ml/m RA Volume:   15.70 ml 7.88 ml/m LA Vol (A4C):   39.1 ml 19.62 ml/m LA Biplane Vol: 33.2 ml 16.66 ml/m  AORTIC VALVE LVOT Vmax:   130.00 cm/s LVOT Vmean:  81.300 cm/s LVOT VTI:    0.180 m  AORTA Ao Root diam: 3.20 cm Ao Asc diam:  3.30 cm MITRAL VALVE                TRICUSPID VALVE MV Area (PHT): 4.31 cm     TR Peak grad:   16.3 mmHg MV Decel Time: 176 msec     TR Vmax:        202.00 cm/s MV E velocity: 72.20 cm/s MV A velocity:  122.00 cm/s  SHUNTS MV E/A ratio:  0.59         Systemic VTI:  0.18 m                             Systemic Diam: 2.10 cm Jenkins Rouge MD Electronically signed by Jenkins Rouge MD Signature Date/Time: 10/06/2020/1:35:19 PM    Final         Scheduled Meds:  (feeding supplement) PROSource Plus  30 mL Oral TID BM   feeding supplement  1 Container Oral Q24H   heparin  5,000 Units Subcutaneous Q12H   insulin aspart  0-9 Units Subcutaneous TID WC   multivitamin with minerals  1 tablet Oral Daily   pantoprazole  40 mg Oral Daily   peg 3350 powder  0.5 kit Oral Once   simvastatin  20 mg Oral QHS   Continuous Infusions:  cefTRIAXone (ROCEPHIN)  IV 2 g (10/07/20 1247)     LOS: 3 days    Time spent: 35 minutes    Irine Seal, MD [facility]   To contact the attending provider between 7A-7P or the covering provider during after hours 7P-7A, please log into the web site [email or web address] and access using universal [place] password for that web site. If you do not have the password, please call the hospital operator.  10/08/2020, 11:53 AM

## 2020-10-08 NOTE — Transfer of Care (Signed)
Immediate Anesthesia Transfer of Care Note  Patient: Beth Sanchez  Procedure(s) Performed: ESOPHAGOGASTRODUODENOSCOPY (EGD) WITH PROPOFOL INVASIVE LAB ABORTED CASE BIOPSY  Patient Location: PACU  Anesthesia Type:MAC  Level of Consciousness: awake, alert , oriented and patient cooperative  Airway & Oxygen Therapy: Patient Spontanous Breathing and Patient connected to face mask oxygen  Post-op Assessment: Report given to RN and Post -op Vital signs reviewed and stable  Post vital signs: Reviewed and stable  Last Vitals:  Vitals Value Taken Time  BP    Temp    Pulse    Resp    SpO2      Last Pain:  Vitals:   10/08/20 1351  TempSrc: Oral  PainSc: 4       Patients Stated Pain Goal: 3 (30/86/57 8469)  Complications: No notable events documented.

## 2020-10-08 NOTE — Interval H&P Note (Signed)
History and Physical Interval Note:  10/08/2020 1:30 PM  Beth Sanchez  has presented today for surgery, with the diagnosis of Iron deficiency anemia, ascites, generalized abdominal pain.  The various methods of treatment have been discussed with the patient and family. After consideration of risks, benefits and other options for treatment, the patient has consented to  Procedure(s): ESOPHAGOGASTRODUODENOSCOPY (EGD) WITH PROPOFOL (N/A) COLONOSCOPY WITH PROPOFOL (N/A) as a surgical intervention.  The patient's history has been reviewed, patient examined, no change in status, stable for surgery.  I have reviewed the patient's chart and labs.  Questions were answered to the patient's satisfaction.     Jackquline Denmark

## 2020-10-09 ENCOUNTER — Inpatient Hospital Stay (HOSPITAL_COMMUNITY): Payer: Medicare Other

## 2020-10-09 ENCOUNTER — Encounter (HOSPITAL_COMMUNITY)
Admission: EM | Disposition: A | Discharge status: Skilled Nursing Facility | Payer: Self-pay | Source: Home / Self Care | Attending: Internal Medicine

## 2020-10-09 ENCOUNTER — Encounter (HOSPITAL_COMMUNITY): Payer: Self-pay | Admitting: Gastroenterology

## 2020-10-09 DIAGNOSIS — I10 Essential (primary) hypertension: Secondary | ICD-10-CM | POA: Diagnosis not present

## 2020-10-09 DIAGNOSIS — R188 Other ascites: Secondary | ICD-10-CM | POA: Diagnosis not present

## 2020-10-09 DIAGNOSIS — N179 Acute kidney failure, unspecified: Secondary | ICD-10-CM | POA: Diagnosis not present

## 2020-10-09 DIAGNOSIS — R180 Malignant ascites: Secondary | ICD-10-CM | POA: Diagnosis not present

## 2020-10-09 DIAGNOSIS — K7460 Unspecified cirrhosis of liver: Secondary | ICD-10-CM | POA: Diagnosis not present

## 2020-10-09 LAB — BASIC METABOLIC PANEL
Anion gap: 16 — ABNORMAL HIGH (ref 5–15)
BUN: 52 mg/dL — ABNORMAL HIGH (ref 8–23)
CO2: 17 mmol/L — ABNORMAL LOW (ref 22–32)
Calcium: 9.8 mg/dL (ref 8.9–10.3)
Chloride: 108 mmol/L (ref 98–111)
Creatinine, Ser: 1.85 mg/dL — ABNORMAL HIGH (ref 0.44–1.00)
GFR, Estimated: 30 mL/min — ABNORMAL LOW (ref >60)
Glucose, Bld: 112 mg/dL — ABNORMAL HIGH (ref 70–99)
Potassium: 5.3 mmol/L — ABNORMAL HIGH (ref 3.5–5.1)
Sodium: 141 mmol/L (ref 135–145)

## 2020-10-09 LAB — GLUCOSE, CAPILLARY
Glucose-Capillary: 118 mg/dL — ABNORMAL HIGH (ref 70–99)
Glucose-Capillary: 106 mg/dL — ABNORMAL HIGH (ref 70–99)
Glucose-Capillary: 83 mg/dL (ref 70–99)

## 2020-10-09 LAB — POTASSIUM: Potassium: 5.5 mmol/L — ABNORMAL HIGH (ref 3.5–5.1)

## 2020-10-09 LAB — CBC
HCT: 36.6 % (ref 36.0–46.0)
Hemoglobin: 11.3 g/dL — ABNORMAL LOW (ref 12.0–15.0)
MCH: 26.1 pg (ref 26.0–34.0)
MCHC: 30.9 g/dL (ref 30.0–36.0)
MCV: 84.5 fL (ref 80.0–100.0)
Platelets: 286 10*3/uL (ref 150–400)
RBC: 4.33 MIL/uL (ref 3.87–5.11)
RDW: 16.4 % — ABNORMAL HIGH (ref 11.5–15.5)
WBC: 12.4 10*3/uL — ABNORMAL HIGH (ref 4.0–10.5)
nRBC: 0 % (ref 0.0–0.2)

## 2020-10-09 LAB — LIPASE, FLUID: Lipase-Fluid: 16 U/L

## 2020-10-09 LAB — MAGNESIUM: Magnesium: 2 mg/dL (ref 1.7–2.4)

## 2020-10-09 SURGERY — COLONOSCOPY WITH PROPOFOL
Anesthesia: Monitor Anesthesia Care

## 2020-10-09 MED ORDER — PEG-KCL-NACL-NASULF-NA ASC-C 100 G PO SOLR
0.5000 | Freq: Once | ORAL | Status: DC
Start: 2020-10-09 — End: 2020-10-13
  Filled 2020-10-09: qty 1

## 2020-10-09 MED ORDER — PEG-KCL-NACL-NASULF-NA ASC-C 100 G PO SOLR: 1.0000 | Freq: Once | ORAL | Status: DC

## 2020-10-09 MED ORDER — SODIUM ZIRCONIUM CYCLOSILICATE 10 G PO PACK
10.0000 g | PACK | Freq: Two times a day (BID) | ORAL | Status: AC
  Administered 2020-10-09 (×2): 10 g via ORAL
  Filled 2020-10-09 (×2): qty 1

## 2020-10-09 MED ORDER — METOCLOPRAMIDE HCL 5 MG/ML IJ SOLN
10.0000 mg | Freq: Once | INTRAMUSCULAR | Status: AC
  Administered 2020-10-09: 10 mg via INTRAVENOUS
  Filled 2020-10-09: qty 2

## 2020-10-09 MED ORDER — SODIUM CHLORIDE 0.9 % IV SOLN: INTRAVENOUS | Status: AC

## 2020-10-09 MED ORDER — PEG-KCL-NACL-NASULF-NA ASC-C 100 G PO SOLR
0.5000 | Freq: Once | ORAL | Status: DC
Start: 2020-10-09 — End: 2020-10-13

## 2020-10-09 NOTE — Progress Notes (Addendum)
Gastroenterology Progress Note  CC:  Ascites   Subjective:  She vomited up the first 1/2 of movi prep last night despite receiving Reglan '10mg'$  IV 30 minutes prior to starting prep. No BM over night. She continues to have right and left mid abdominal pains which comes and goes.   Objective:  Vital signs in last 24 hours: Temp:  [97.5 F (36.4 C)-97.8 F (36.6 C)] 97.5 F (36.4 C) (08/19 0513) Pulse Rate:  [89-118] 102 (08/19 0513) Resp:  [16-18] 18 (08/19 0513) BP: (79-102)/(64-71) 95/71 (08/19 0513) SpO2:  [80 %-100 %] 98 % (08/19 0513) Weight:  [87.7 kg] 87.7 kg (08/18 1351) Last BM Date: 10/05/20  General:   Alert in NAD. Heart: RRR, no murmur.  Pulm:  Breath sounds clear throughout.  Abdomen: Upper abdomen distended with ascites +/- anasarca component. Nontender. + BS x 4 quads. No mass. No obvious HSM. Hypoactive BS x 4 quads.  Extremities:  Trace lower extremity edema. Neurologic:  Alert and  oriented x 4. Grossly normal neurologically. Psych:  Alert and cooperative. Normal mood and affect.  Intake/Output from previous day: 08/18 0701 - 08/19 0700 In: 2080 [P.O.:480; I.V.:1600] Out: 1 [Emesis/NG output:1] Intake/Output this shift: No intake/output data recorded.  Lab Results: Recent Labs    10/07/20 0407 10/08/20 0506 10/09/20 0401  WBC 5.0 5.1 12.4*  HGB 9.7* 10.9* 11.3*  HCT 30.6* 34.4* 36.6  PLT 275 237 286   BMET Recent Labs    10/07/20 0407 10/08/20 0506 10/09/20 0401  NA 136  136 136 141  K 4.6  4.6 4.6 5.3*  CL 105  106 107 108  CO2 21*  20* 20* 17*  GLUCOSE 92  94 81 112*  BUN 65*  65* 49* 52*  CREATININE 1.55*  1.61* 1.23* 1.85*  CALCIUM 9.3  9.4 9.5 9.8   LFT Recent Labs    10/08/20 0506  PROT 6.7  ALBUMIN 3.9  AST 14*  ALT 9  ALKPHOS 73  BILITOT 1.0   PT/INR No results for input(s): LABPROT, INR in the last 72 hours. Hepatitis Panel No results for input(s): HEPBSAG, HCVAB, HEPAIGM, HEPBIGM in the last 72  hours.  CT CHEST WO CONTRAST  Result Date: 10/07/2020 CLINICAL DATA:  Unintentional weight loss. Clinical concern for malignancy. EXAM: CT CHEST WITHOUT CONTRAST TECHNIQUE: Multidetector CT imaging of the chest was performed following the standard protocol without IV contrast. COMPARISON:  Radiograph 10/05/2020 FINDINGS: Cardiovascular: Aortic atherosclerosis. Heart is normal in size. There are coronary artery calcifications. No pericardial effusion. Mediastinum/Nodes: No enlarged mediastinal lymph nodes. Limited assessment for hilar adenopathy in this unenhanced exam. Slightly patulous esophagus. No esophageal wall thickening. No thyroid nodule. There is no axillary adenopathy. Lungs/Pleura: Chest bilateral pleural effusions. Elevation of left hemidiaphragm with adjacent compressive atelectasis at the left lung base. No confluent airspace disease. There is no dominant pulmonary mass. There are tiny bilateral pulmonary nodules within both lungs, the majority of which are subpleural or perifissural. Most of these are in the right lung, and majority are millimetric, with largest in the right upper lobe measuring 3 mm series 7, image 38, and 3 mm perifissural left upper lobe series 7, image 43. Additional tiny nodules are seen in the right lung on series 7, images 18, 28, 83 and 95. The trachea and central bronchi are patent. Upper Abdomen: Assessed on recent abdominopelvic CT. Moderate to large volume ascites again seen in the upper abdomen. Musculoskeletal: There are no acute or suspicious  osseous abnormalities. No evidence of focal bone lesion or bone destruction. Multilevel degenerative change in the spine. Minimal chest wall soft tissue edema. No dominant breast mass or soft tissue abnormality. IMPRESSION: 1. Tiny bilateral pulmonary nodules, the majority of which are subpleural or perifissural. Largest nodule measures 3 mm. Mild nonspecific, none of these nodules are specifically suspicious for malignancy. No  follow-up needed if patient is low-risk (and has no known or suspected primary neoplasm). Non-contrast chest CT can be considered in 12 months if patient is high-risk. This recommendation follows the consensus statement: Guidelines for Management of Incidental Pulmonary Nodules Detected on CT Images: From the Fleischner Society 2017; Radiology 2017; 284:228-243. 2. No explanation for weight loss. No findings suspicious for intrathoracic malignancy. 3. Small bilateral pleural effusions. Elevation of left hemidiaphragm with adjacent compressive atelectasis at the left lung base. 4. Aortic atherosclerosis.  Coronary artery calcifications. Aortic Atherosclerosis (ICD10-I70.0). Electronically Signed   By: Keith Rake M.D.   On: 10/07/2020 19:55   US Paracentesis  Result Date: 10/07/2020 INDICATION: Patient with history of acute kidney injury, COVID-19, anemia, abdominal distension/recurrent ascites; request received for diagnostic and therapeutic paracentesis up to 6 liters. EXAM: ULTRASOUND GUIDED DIAGNOSTIC AND THERAPEUTIC  PARACENTESIS MEDICATIONS: 1% lidocaine to skin/SQ tissue COMPLICATIONS: None immediate. PROCEDURE: Informed written consent was obtained from the patient after a discussion of the risks, benefits and alternatives to treatment. A timeout was performed prior to the initiation of the procedure. Initial ultrasound scanning demonstrates a moderate amount of ascites within the right lower abdominal quadrant. The right lower abdomen was prepped and draped in the usual sterile fashion. 1% lidocaine was used for local anesthesia. Following this, a 19 gauge, 10-cm, Yueh catheter was introduced. An ultrasound image was saved for documentation purposes. The paracentesis was performed. The catheter was removed and a dressing was applied. The patient tolerated the procedure well without immediate post procedural complication. Patient currently receiving IV albumin. FINDINGS: A total of approximately 3.3  liters of hazy,dark amber fluid was removed. Samples were sent to the laboratory as requested by the clinical team. IMPRESSION: Successful ultrasound-guided diagnostic and therapeutic paracentesis yielding 3.3 liters of peritoneal fluid. Read by: Rowe Robert, PA-C Electronically Signed   By: Miachel Roux M.D.   On: 10/07/2020 14:18    Assessment / Plan:  7)  65 year old female admitted to the hospital 8/15 with new onset ascites with generalized abdominal pain. CTAP identified a large amount of ascites. RUQ sono showed coarsened liver parenchyma with questionable liver surface irregularity suggestive of possible cirrhosis.  Unlikely cirrhosis with normal LFTs, platelet count and INR. S/P paracentesis 8/15 removed 3.5 L peritoneal fluid, no evidence of SBP.  Peritoneal fluid culture negative. SAAG < 1.1 which is not consistent with cirrhosis/portal hypertension and is concerning for inflammatory/infectious or malignant process. Peritoneal LDH elevated at 497 which also supports infectious vs malignant ascites. Peritoneal protein level elevated at 5.2. Repeat paracentesis 8/17 removed 3.3L. No SBP. Cytology + for malignant cells, awaiting other cytology stains.  ECHO 8/16 with normal RV function and LV EF 60 - 65%. Normal LFTs. Albumin 3.2 -> 3.8.  Hepatitis B surface antigen negative.  Hep C antibody negative.  IgG 886. Ceruloplasmin 21.8. CA 125 grossly elevated 7,281 concerning for ovarian malignancy. CA19-9 level 7. AFP 1.8. AMA, SMA and ANA pending. Chest CT 8/17 showed tiny bilateral pulmonary nodule, not suspicious for malignancy. S/P EGD 8/18 showed a small hiatal hernia, reflux and gastritis. Colonoscopy was canceled due to poor prep. Planned to  repeat colonoscopy today, however, she vomited the first dose of Movi prep despite receiving Reglan prior to starting prep. No BM overnight. Colonoscopy canceled.  -Stat abdominal xray to rule out obstruction  -Clear liquids for now, will advance diet if  abdominal xray negative  -Await recommendations per Dr. Lyndel Safe regarding further colonoscopy attempts, she may require NGT for bowel prep if colonoscopy reattempted during this hospitalization  -Continue IV fluids -Ondansetron '4mg'$  po or IV Q 6 hrs as needed  -Continue Pantoprazole '40mg'$  IV bid -Eventual abd/pelvic CT vs MRI with contrast when renal status normalizes  -Await oncology recommendations    2) Iron deficiency anemia. CTAP without evidence of upper or lower GI mass and no splenomegaly.  Chest CT negative. Hg 11.6 -> 10.1 -> 9.7 -> 10.9 -> 11.3. Iron 19. Ferritin 511.  Received IV iron 8/18.  3) AKI, Renal sodium level 63, unlikely HRS. Urine protein level negative, unlikely nephrotic syndrome. Possibly due to hypotension, dehydration +/- Jardiance. Cr 3.34 -> 2.28 ->1.61 -> 1.55 -> 1.23 -> Today Cr level 1.85.   4)  Covid 19 +, asymptomatic   5)  Malnutrition, weight loss  -Appreciate RD recommendations    6) DM II   7) UTI, urine cultures + for Staphylococcus simulans -Antibiotic tx per the hospitalist     Principal Problem:   Ascites Active Problems:   Essential hypertension   Hyperlipidemia associated with type 2 diabetes mellitus (HCC)   Type 2 diabetes mellitus with hyperglycemia (HCC)   Cirrhosis of liver (HCC)   Cirrhosis (HCC)   Iron deficiency anemia   AKI (acute kidney injury) ([place])   Hyperkalemia   Hyponatremia   Gastritis     LOS: 4 days   Noralyn Sanchez  10/09/2020, 9:34 AM   Attending physician's note   I have taken an interval history, reviewed the chart and examined the patient. I agree with the Advanced Practitioner's note, impression and recommendations.   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  However, was done without contrast.  I could not visualize ovaries Neg EGD. Have tried to prep her for colon over last 2 days but unable to, despite PO moviprep, enemas and  Reglan.  Also have attempted colonoscopy but stopped due to solid retained stool.  Asymptomatic COVID-19 +  Reviewed oncology consult.  They would like to get colonoscopy if possible Will place NG tube to prep and perform colonoscopy in a.m. Instructed nursing staff to stop prep if she has any N/V   Carmell Austria, MD [facility] [phone]

## 2020-10-09 NOTE — Progress Notes (Signed)
PROGRESS NOTE    Beth Sanchez  MBT:[id number] DOB: March 06, 1955 DOA: 10/05/2020 PCP: Pcp, No    Chief Complaint  Patient presents with   Abdominal Pain    Vomiting, diarrhea    Brief Narrative:  65 year old F with PMH of HTN, IDDM-2 and obesity presenting with increasing abdominal pain, distention, lower back pain, emesis and fatigue, and found to have liver cirrhosis with ascites, AKI, hypotension, mild hyponatremia, and COVID-19 infection.  She had IR perform paracentesis with removal of 3.5 L.  Had leukocytosis to 15.2.  Started on IV ceftriaxone until SBP is excluded.  Cr 3.34.  She had no respiratory symptoms in regards to a COVID-19 infection.  GI consulted.   Patient never had colonoscopy.  No history of GI bleed.  Quit drinking about 30 years ago.     Assessment & Plan:   Principal Problem:   Ascites Active Problems:   Essential hypertension   Hyperlipidemia associated with type 2 diabetes mellitus (HCC)   Type 2 diabetes mellitus with hyperglycemia (HCC)   Cirrhosis of liver (HCC)   Cirrhosis (HCC)   Iron deficiency anemia   AKI (acute kidney injury) ([place])   Hyperkalemia   Hyponatremia   Gastritis  #1 large malignant ascites -Patient presenting with abdominal distention, CT abdomen and pelvis as well as abdominal ultrasound consistent with large volume ascites. -Status post paracentesis with 3.5 L of fluid removed. -Patient with aSAAG < 1.1 arguing against portal hypertension. -Body fluid cultures with no growth to date with low probability for SBP -Leukocytosis trending down. -No significant exquisite tenderness to palpation. -Peritoneal fluid LDH elevated. -Patient seems to have had some reaccumulation of abdominal ascites and subsequently underwent repeat ultrasound-guided paracentesis 10/07/2020 with 3.3 L of fluid removed. -Cytology consistent with malignant cells. -CT chest with tiny pulmonary nodules and no significant mass or malignancy noted.   -CA 125  elevated at 7281.   -CA 19-9 at 7, CEA at 2.2, AFP at 1.8  -Status post IV albumin.   -Patient was on IV Rocephin which has subsequently been discontinued as SBP highly unlikely -Patient status post upper endoscopy which showed small hiatal hernia, reflux and gastritis.  -Due to elevated CA 125 levels, concern for malignant ascites likely from ovarian source oncology consulted. -Colonoscopy attempted however due to poor bowel prep was canceled.  -Per GI oncology would like to get colonoscopy if possible and as such NG tube ordered by GI to prep and perform colonoscopy in the morning. -Oncology recommending transvaginal ultrasound for further evaluation of ovaries. -Paracentesis as needed. -GI following and appreciate input and recommendations..  2.  Acute kidney injury -Likely secondary to prerenal azotemia from hypotension in the setting of Zestoretic, Jardiance. -Fractional excretion of sodium of 0.9% suggesting a prerenal etiology. -Urinalysis with trace leukocytes, nitrite negative, negative protein arguing against nephrotic syndrome. -Zestoretic on hold. -Urine output not properly recorded. -Renal function is improving however creatinine rising back up currently at 1.85 from 1.23. -I patient with borderline/soft blood pressure, was getting prepped for colonoscopy and had been NPO. -Gentle hydration for the next 24 to 48 hours. -Monitor urine output. -Avoid nephrotoxic agents. -Follow.  3.  Hypotension -In the setting of cirrhosis with ascites and dehydration from GI loss. -BP improved however somewhat soft on IV albumin. -Continue to hold antihypertensive medications. -Patient pancultured with blood cultures pending with no growth to date. -Peritoneal fluid cultures negative to date. -Urine cultures with 10,000 colonies of Staphylococcus stimulants likely of no significance. -Blood cultures with no  growth to date. -Status post IV albumin. -BP borderline this morning/soft, bump  in creatinine likely secondary to dehydration. -Placed on gentle hydration for the next 24 to 48 hours. -Supportive care.  4.  Hyperkalemia -Received a dose of Lokelma early on in the hospitalization with improvement with hyperkalemia however potassium elevated at 5.3 will repeat at 5.5.   -Lokelma twice daily x1 day.   -Repeat labs in the morning.    5.  Hyponatremia -Likely secondary to hypervolemia due to concerns for cirrhosis and abdominal ascites in the setting of Zestoretic. -Zestoretic on hold. -Sodium levels currently at 141.    6.  Non-anion gap metabolic acidosis likely from AKI -Fluctuating.  Follow.  7.  Incidental COVID infection -Patient noted to have tested positive on 10/05/2020. -Patient noted to be vaccinated. -Patient not hypoxic with sats of 100% on room air -No indication for treatment at this time. -Continue isolation precautions for total of 10 days.  8.  Non-insulin-dependent diabetes mellitus type 2 -Noted to be on Jardiance and metformin prior to admission which are currently on hold in the setting of AKI. -Hemoglobin A1c 5.9 (10/07/2020) -CBG 118 today. -Continue to hold oral hypoglycemic agents of metformin and Jardiance. -Discontinue SSI.  9.  Iron deficiency anemia -Anemia panel with iron level of 19, ferritin of 511. -Hemoglobin currently at 11.3 from 10.9 from 9.7 from 11.6 on admission. -Patient status post EGD 10/08/2020 which showed small hiatal hernia, reflux and gastritis.   -Colonoscopy canceled due to poor bowel prep.   -Status post IV iron 10/08/2020.   -Transfusion threshold hemoglobin < 7.  -Patient to be prepped again tonight for probable colonoscopy tomorrow. -Per GI.   10.  Elevated D-dimer -Felt likely secondary to COVID-19 infection. -Continue current DVT prophylaxis.   11.  Elevated CA 125 -Patient with elevated levels of CA125 of 7281. -Cytology from paracentesis within malignant cells.   -Concern for ovarian  malignancy. 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 -Oncology feels may benefit from a transvaginal ultrasound for further evaluation of ovaries. -Per oncology patient likely a candidate for systemic chemotherapy. -Per oncology   DVT prophylaxis: Heparin Code Status: Full Family Communication: Updated patient.  No family at bedside. Disposition:   Status is: Inpatient  Remains inpatient appropriate because:Inpatient level of care appropriate due to severity of illness  Dispo: The patient is from: Home              Anticipated d/c is to: Home              Patient currently is not medically stable to d/c.   Difficult to place patient No       Consultants:  Gastroenterology: Dr. Lyndel Safe 10/06/2020 Oncology: Dr. Marin Olp 10/09/2020  Procedures:  Renal ultrasound 10/05/2020 Right upper quadrant abdominal ultrasound 10/05/2020 Chest x-ray 10/05/2020 CT abdomen and pelvis 10/05/2020 2D echo 10/06/2020 Ultrasound-guided paracentesis -3.3 L of hazy dark amber fluid removed per IR, Darrell Allred, PA 10/07/2020 Ultrasound-guided paracentesis 10/05/2020 per Darrell Allred, PA IR with 3.5 L of hazy fluid removed. Upper endoscopy 10/08/2020 CT chest 10/07/2020   Antimicrobials: IV Rocephin 10/05/2020>>>>>> 10/08/2020 IV Flagyl 10/05/2020 x 1 dose   Subjective: Laying in bed.  Stated had some episodes of emesis last night while trying to drink bowel prep.  No chest pain.  States abdominal pain intermittent.  Patient just received some pain medication.  No bowel movement overnight.  Objective: Vitals:   10/08/20 1505 10/08/20 1942  10/09/20 0513 10/09/20 1311  BP: (!) '88/67 91/64 95/71 ' 105/74  Pulse: 89 (!) 118 (!) 102 (!) 103  Resp: '16 17 18 18  ' Temp:  (!) 97.5 F (36.4 C) (!) 97.5 F (36.4 C)   TempSrc:  Oral Oral   SpO2: 100% (!) 80% 98% 97%  Weight:      Height:        Intake/Output Summary (Last 24 hours) at 10/09/2020  1949 Last data filed at 10/09/2020 1600 Gross per 24 hour  Intake 1480 ml  Output 2 ml  Net 1478 ml    Filed Weights   10/06/20 0000 10/08/20 1351  Weight: 87.7 kg 87.7 kg    Examination:  General exam: NAD Respiratory system: CTA B.  No wheezes, no crackles, no rhonchi.  Normal respiratory effort.  Cardiovascular system: RRR no murmurs rubs or gallops.  No JVD.  Trace to 1+ bilateral lower extremity edema. Gastrointestinal system: Mildly distended, soft, decreased tenderness to palpation, hypoactive bowel sounds.  No rebound.  No guarding.  Central nervous system: Alert and oriented. No focal neurological deficits. Extremities: Trace to 1+ BLE edema.  Skin: No rashes, lesions or ulcers Psychiatry: Judgement and insight appear normal. Mood & affect appropriate.     Data Reviewed: I have personally reviewed following labs and imaging studies  CBC: Recent Labs  Lab 10/05/20 1136 10/06/20 0339 10/07/20 0407 10/08/20 0506 10/09/20 0401  WBC 6.6 6.6 5.0 5.1 12.4*  NEUTROABS 5.9  --   --  4.3  --   HGB 11.6* 10.1* 9.7* 10.9* 11.3*  HCT 36.4 32.2* 30.6* 34.4* 36.6  MCV 82.9 84.3 83.6 84.3 84.5  PLT 375 281 275 237 286     Basic Metabolic Panel: Recent Labs  Lab 10/05/20 1136 10/06/20 0339 10/07/20 0407 10/08/20 0506 10/09/20 0401 10/09/20 0915  NA 131* 133* 136  136 136 141  --   K 5.2* 5.4* 4.6  4.6 4.6 5.3* 5.5*  CL 101 103 105  106 107 108  --   CO2 18* 18* 21*  20* 20* 17*  --   GLUCOSE 96 71 92  94 81 112*  --   BUN 93* 77* 65*  65* 49* 52*  --   CREATININE 3.34* 2.28* 1.55*  1.61* 1.23* 1.85*  --   CALCIUM 9.0 9.0 9.3  9.4 9.5 9.8  --   MG  --  2.1 2.1 1.9 2.0  --   PHOS  --  4.6 3.1  --   --   --      GFR: Estimated Creatinine Clearance: 34.5 mL/min (A) (by C-G formula based on SCr of 1.85 mg/dL (H)).  Liver Function Tests: Recent Labs  Lab 10/05/20 1136 10/06/20 0339 10/07/20 0407 10/08/20 0506  AST 16 13* 13* 14*  ALT '12 10 9 9   ' ALKPHOS 122 93 75 73  BILITOT 0.7 0.8 0.8 1.0  PROT 7.4 6.5 6.6 6.7  ALBUMIN 3.0* 3.2* 3.8  3.8 3.9     CBG: Recent Labs  Lab 10/08/20 1243 10/08/20 1747 10/08/20 1935 10/09/20 1309 10/09/20 1729  GLUCAP 80 90 96 118* 106*      Recent Results (from the past 240 hour(s))  Blood Culture (routine x 2)     Status: None (Preliminary result)   Collection Time: 10/05/20 11:36 AM   Specimen: BLOOD  Result Value Ref Range Status   Specimen Description   Final    BLOOD LEFT ANTECUBITAL Performed at [facility], [address] [place], [place] [address]    Special Requests   Final    BOTTLES DRAWN AEROBIC AND ANAEROBIC Blood Culture results may not be optimal due to an inadequate volume of blood received in culture bottles Performed at [place] [address] [place], [place] [address]    Culture   Final    NO GROWTH 4 DAYS Performed at [facility], [place] [address], [place], [place] [address]    Report Status PENDING  Incomplete  Resp Panel by RT-PCR (Flu A&B, Covid) Nasopharyngeal Swab     Status: Abnormal   Collection Time: 10/05/20 12:00 PM   Specimen: Nasopharyngeal Swab; Nasopharyngeal(NP) swabs in vial transport medium  Result Value Ref Range Status   SARS Coronavirus 2 by RT PCR POSITIVE (A) NEGATIVE Final    Comment: RESULT CALLED TO, READ BACK BY AND VERIFIED WITH: Halina Maidens, RN 10/05/20 1446 KDS (NOTE) SARS-CoV-2 target nucleic acids are DETECTED.  The SARS-CoV-2 RNA is generally detectable in upper respiratory specimens during the acute phase of infection. Positive results are indicative of the presence of the identified virus, but do not rule out bacterial infection or co-infection with other pathogens not detected by the test. Clinical correlation with patient history and other diagnostic information is necessary to determine patient infection status. The expected result is Negative.  Fact Sheet for  Patients: EntrepreneurPulse.com.au  Fact Sheet for Healthcare Providers: IncredibleEmployment.be  This test is not yet approved or cleared by the [place] FDA and  has been authorized for detection and/or diagnosis of SARS-CoV-2 by FDA under an Emergency Use Authorization (EUA).  This EUA will remain in effect (meaning this test can be u sed) for the duration of  the COVID-19 declaration under Section 564(b)(1) of the Act, 21 U.S.C. section 360bbb-3(b)(1), unless the authorization is terminated or revoked sooner.     Influenza A by PCR NEGATIVE NEGATIVE Final   Influenza B by PCR NEGATIVE NEGATIVE Final    Comment: (NOTE) The Xpert Xpress SARS-CoV-2/FLU/RSV plus assay is intended as an aid in the diagnosis of influenza from Nasopharyngeal swab specimens and should not be used as a sole basis for treatment. Nasal washings and aspirates are unacceptable for Xpert Xpress SARS-CoV-2/FLU/RSV testing.  Fact Sheet for Patients: EntrepreneurPulse.com.au  Fact Sheet for Healthcare Providers: IncredibleEmployment.be  This test is not yet approved or cleared by the [place] FDA and has been authorized for detection and/or diagnosis of SARS-CoV-2 by FDA under an Emergency Use Authorization (EUA). This EUA will remain in effect (meaning this test can be used) for the duration of the COVID-19 declaration under Section 564(b)(1) of the Act, 21 U.S.C. section 360bbb-3(b)(1), unless the authorization is terminated or revoked.  Performed at [facility], [place] [address], [place], [place] [address]   Blood Culture (routine x 2)     Status: None (Preliminary result)   Collection Time: 10/05/20 12:00 PM   Specimen: BLOOD  Result Value Ref Range Status   Specimen Description   Final    BLOOD RIGHT ANTECUBITAL Performed at [place] [address] [place], [place]  [address]    Special Requests   Final    BOTTLES DRAWN AEROBIC AND ANAEROBIC Blood Culture results may not be optimal due to an inadequate volume of blood received in culture bottles Performed at [place] [address] [place], [place] [address]    Culture   Final    NO GROWTH 4  DAYS Performed at [facility], [place] [address], [place], [place] [address]    Report Status PENDING  Incomplete  Body fluid culture w Gram Stain     Status: None   Collection Time: 10/05/20  3:07 PM   Specimen: Peritoneal Cavity; Peritoneal Fluid  Result Value Ref Range Status   Specimen Description   Final    PERITONEAL CAVITY Performed at [place] [address], [place], [place] [address]    Special Requests   Final    NONE Performed at [facility], [place] [address] [place], [place] [address]    Gram Stain   Final    NO SQUAMOUS EPITHELIAL CELLS SEEN FEW WBC SEEN NO ORGANISMS SEEN    Culture   Final    NO GROWTH Performed at [facility], [place] [address] [place], [place] [address]    Report Status 10/08/2020 FINAL  Final  Culture, body fluid w Gram Stain-bottle     Status: None (Preliminary result)   Collection Time: 10/05/20  5:02 PM   Specimen: Fluid  Result Value Ref Range Status   Specimen Description FLUID RIGHT PERITONEAL  Final   Special Requests   Final    BOTTLES DRAWN AEROBIC AND ANAEROBIC Blood Culture adequate volume   Culture   Final    NO GROWTH 4 DAYS Performed at [facility], [place] [address], [place], [place] [address]    Report Status PENDING  Incomplete  Gram stain     Status: None   Collection Time: 10/05/20  5:02 PM   Specimen: Fluid  Result Value Ref Range Status   Specimen Description FLUID RIGHT PERITONEAL  Final   Special Requests NONE  Final   Gram Stain   Final    RARE SQUAMOUS EPITHELIAL CELLS PRESENT RARE WBC SEEN NO ORGANISMS SEEN Performed at [facility], [place] [address] [place], [place] [address]    Report Status 10/06/2020 FINAL  Final  Urine Culture     Status: Abnormal   Collection Time: 10/05/20  9:19 PM   Specimen: In/Out Cath Urine  Result Value Ref Range Status   Specimen Description   Final    IN/OUT CATH URINE Performed at [place] [address], [place], [place] [address]    Special Requests   Final    NONE Performed at [facility], [place] [address] [place], [place] [address]    Culture 10,000 COLONIES/mL STAPHYLOCOCCUS SIMULANS (A)  Final   Report Status 10/08/2020 FINAL  Final   Organism ID, Bacteria STAPHYLOCOCCUS SIMULANS (A)  Final      Susceptibility   Staphylococcus simulans - MIC*    CIPROFLOXACIN <=0.5 SENSITIVE Sensitive     GENTAMICIN <=0.5 SENSITIVE Sensitive     NITROFURANTOIN <=16 SENSITIVE Sensitive     OXACILLIN <=0.25 SENSITIVE Sensitive     TETRACYCLINE <=1 SENSITIVE Sensitive     VANCOMYCIN <=0.5 SENSITIVE Sensitive     TRIMETH/SULFA <=10 SENSITIVE Sensitive     CLINDAMYCIN <=0.25 SENSITIVE Sensitive     RIFAMPIN <=0.5 SENSITIVE Sensitive     Inducible Clindamycin NEGATIVE Sensitive     * 10,000 COLONIES/mL STAPHYLOCOCCUS SIMULANS  Body fluid culture w Gram Stain     Status: None (Preliminary result)   Collection Time: 10/07/20 12:40 PM   Specimen: Peritoneal Washings  Result Value Ref Range Status   Specimen Description   Final    PERITONEAL Performed at [facility]  [place] [address], [place], [place] [address]    Special Requests   Final    NONE Performed at [facility], [place] [address], [place], [place] [address]    Gram Stain   Final    RARE WBC PRESENT,BOTH PMN AND MONONUCLEAR NO ORGANISMS SEEN    Culture   Final    NO GROWTH 2 DAYS Performed at [facility], [place] [address] [place], [place] [address]    Report Status PENDING  Incomplete          Radiology Studies: DG Abd Portable 1V  Result  Date: 10/09/2020 CLINICAL DATA:  Intermittent abdominal pain with distention. EXAM: PORTABLE ABDOMEN - 1 VIEW COMPARISON:  None. FINDINGS: Single mildly dilated small bowel loop in the left abdomen. Air and stool in the rectum. No radio-opaque calculi or other significant radiographic abnormality are seen. No acute osseous abnormality. IMPRESSION: 1. Single mildly dilated small bowel loop in the left abdomen, favor focal ileus. Electronically Signed   By: Titus Dubin M.D.   On: 10/09/2020 11:30        Scheduled Meds:  (feeding supplement) PROSource Plus  30 mL Oral TID BM   feeding supplement  1 Container Oral Q24H   heparin  5,000 Units Subcutaneous Q12H   metoCLOPramide (REGLAN) injection  10 mg Intravenous Once   multivitamin with minerals  1 tablet Oral Daily   pantoprazole (PROTONIX) IV  40 mg Intravenous Q12H   peg 3350 powder  0.5 kit Oral Once   And   peg 3350 powder  0.5 kit Oral Once   simvastatin  20 mg Oral QHS   sodium zirconium cyclosilicate  10 g Oral BID   Continuous Infusions:  sodium chloride 125 mL/hr at 10/09/20 0955     LOS: 4 days    Time spent: 35 minutes    Irine Seal, MD [facility]   To contact the attending provider between 7A-7P or the covering provider during after hours 7P-7A, please log into the web site [email or web address] and access using universal [place] password for that web site. If you do not have the password, please call the hospital operator.  10/09/2020, 7:49 PM

## 2020-10-09 NOTE — Care Management Important Message (Signed)
Important Message  Patient Details IM Letter given to the Patient. Name: Beth Sanchez MRN: [id number] Date of Birth: Mar 22, 1955   Medicare Important Message Given:  Yes     Kerin Salen 10/09/2020, 10:14 AM

## 2020-10-09 NOTE — Anesthesia Postprocedure Evaluation (Signed)
Anesthesia Post Note  Patient: Beth Sanchez  Procedure(s) Performed: ESOPHAGOGASTRODUODENOSCOPY (EGD) WITH PROPOFOL INVASIVE LAB ABORTED CASE BIOPSY     Patient location during evaluation: PACU Anesthesia Type: MAC Level of consciousness: awake and alert Pain management: pain level controlled Vital Signs Assessment: post-procedure vital signs reviewed and stable Respiratory status: spontaneous breathing, nonlabored ventilation, respiratory function stable and patient connected to nasal cannula oxygen Cardiovascular status: stable and blood pressure returned to baseline Postop Assessment: no apparent nausea or vomiting Anesthetic complications: no   No notable events documented.  Last Vitals:  Vitals:   10/08/20 1942 10/09/20 0513  BP: 91/64 95/71  Pulse: (!) 118 (!) 102  Resp: 17 18  Temp: (!) 36.4 C (!) 36.4 C  SpO2: (!) 80% 98%    Last Pain:  Vitals:   10/09/20 0530  TempSrc:   PainSc: 2                  Sharissa Brierley S

## 2020-10-09 NOTE — Consult Note (Addendum)
[place]  [phone] [phone]   MEDICAL ONCOLOGY - INITIAL CONSULTATION  Referral MD: Dr. Jackquline Denmark  Reason for Referral: Malignant ascites, elevated CA125  HPI: Beth Sanchez is a 65 year old female with a past medical history significant for hypertension and diabetes.  She presented to the emergency department with worsening abdominal pain and distention.  She developed abdominal distention and leg swelling about 1 week prior to admission.  She went to see her PCP who cut down on her blood pressure medication (ACE inhibitor) due to renal function.  She had also developed soreness in her abdomen and distention.  She also reported some lower back pain and fatigue.  On the day prior to admission, she developed vomiting and loose bowel movements.  Admission lab work showed a hemoglobin of 11.6, sodium 131, potassium 5.2, BUN 93, creatinine 3.34, albumin 3.0, LDH 487.  She was noted to be COVID-positive on admission.  She had a CT of the abdomen/pelvis without contrast which showed a large amount of ascites, cholelithiasis without acute cholecystitis, mild sigmoid diverticulosis without evidence of diverticulitis.  Abdominal ultrasound showed cholelithiasis, coarsened liver parenchyma with questionable liver surface irregularity which may indicate cirrhosis and no liver mass detected, large volume upper abdominal ascites.  GI was consulted.  Hepatitis B and C serologies negative.  Ferritin level was elevated but iron level was low at 19.  She received a dose of IV iron on 8/18.  She had an ultrasound-guided paracentesis performed on 10/07/2020 with 3.3 L of fluid removed.  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  Additional immunohistochemistry stains are pending.  She had a CT of the chest without contrast performed on 10/07/2020 which showed tiny  bilateral pulmonary nodules the largest measures 3 mm which are nonspecific.  She had a repeat ultrasound-guided paracentesis performed 10/07/2020 and that cytology also showed malignant cells.  Tumor markers have been obtained this admission including an AFP which was normal at 1.8, CA 19.9 which was normal at 7, CEA normal at 2.2, CA125 elevated at 7281.  She had an EGD performed on 10/08/2020 which showed esophagitis which was biopsied, small hiatal hernia, gastritis which was biopsied, no obvious masses.  Colonoscopy was also attempted but prep was poor.  Plan was for repeat colonoscopy today but this has now been canceled because the patient could not tolerate prep.  The patient tells me that she has had a poor appetite and weight loss of about 40 to 50 pounds over the past several months.  She has had generalized weakness.  Recently, she has developed abdominal pain, distention, and loose bowel movements.  She had vomiting overnight when trying to take her colonoscopy prep.  Her colonoscopy for today has been canceled.  She reports headaches which she attributes to not eating very well.  Denies dizziness.  She has not had any recent fevers, chills, night sweats.  She denies chest pain, shortness of breath, cough.  She is not having any nausea.  Denies constipation.  Has not noticed any melena or hematochezia.  She has been having lower extremity edema.  No bleeding reported.  The patient states that she has never had a colonoscopy before.  She has not had a mammogram. The patient is widowed.  She currently lives with her sister.  She has no children.  Denies history of alcohol and tobacco use.  Family history significant  for a mother and father who had cancer but she is not certain what type of cancer they had.  Medical oncology was asked to see the patient to make recommendations regarding her malignant ascites and elevated CA125.  Past Medical History:  Diagnosis Date   Hypertension    Psoriasis    :  History reviewed. No pertinent surgical history.:   Current Facility-Administered Medications  Medication Dose Route Frequency Provider Last Rate Last Admin   (feeding supplement) PROSource Plus liquid 30 mL  30 mL Oral TID BM Eugenie Filler, MD   30 mL at 10/07/20 2009   0.9 %  sodium chloride infusion   Intravenous Continuous Eugenie Filler, MD 75 mL/hr at 10/08/20 2307 New Bag at 10/08/20 2307   acetaminophen (TYLENOL) tablet 650 mg  650 mg Oral Q6H PRN Lequita Halt, MD       Or   acetaminophen (TYLENOL) suppository 650 mg  650 mg Rectal Q6H PRN Lequita Halt, MD       feeding supplement (BOOST / RESOURCE BREEZE) liquid 1 Container  1 Container Oral Q24H Eugenie Filler, MD       heparin injection 5,000 Units  5,000 Units Subcutaneous Q12H Wynetta Fines T, MD   5,000 Units at 10/08/20 2121   HYDROmorphone (DILAUDID) injection 0.5 mg  0.5 mg Intravenous Q4H PRN Eugenie Filler, MD   0.5 mg at 10/08/20 1202   multivitamin with minerals tablet 1 tablet  1 tablet Oral Daily Eugenie Filler, MD       ondansetron [facility]) tablet 4 mg  4 mg Oral Q6H PRN Wynetta Fines T, MD   4 mg at 10/07/20 2231   Or   ondansetron (ZOFRAN) injection 4 mg  4 mg Intravenous Q6H PRN Wynetta Fines T, MD   4 mg at 10/09/20 [id number]   oxyCODONE (Oxy IR/ROXICODONE) immediate release tablet 5 mg  5 mg Oral Q4H PRN Wynetta Fines T, MD   5 mg at 10/09/20 0445   pantoprazole (PROTONIX) injection 40 mg  40 mg Intravenous Q12H Eugenie Filler, MD   40 mg at 10/08/20 2120   simvastatin (ZOCOR) tablet 20 mg  20 mg Oral QHS Wynetta Fines T, MD   20 mg at 10/08/20 2121     No Known Allergies:   Family History  Problem Relation Age of Onset   Stroke Mother    Heart disease Sister    Heart attack Maternal Grandfather 71   Diabetes Other    Colon cancer Other    Lung cancer Other    Cancer Other        Bladder cancer   Hypertension Other    Thyroid disease Other        Hypothyroid  :   Social History    Socioeconomic History   Marital status: Widowed    Spouse name: Not on file   Number of children: Not on file   Years of education: Not on file   Highest education level: Not on file  Occupational History   Not on file  Tobacco Use   Smoking status: Never   Smokeless tobacco: Never  Substance and Sexual Activity   Alcohol use: No   Drug use: No   Sexual activity: Not on file  Other Topics Concern   Not on file  Social History Narrative   Not on file   Social Determinants of Health   Financial Resource Strain: Not on file  Food Insecurity: Not  on file  Transportation Needs: Not on file  Physical Activity: Not on file  Stress: Not on file  Social Connections: Not on file  Intimate Partner Violence: Not on file  :  Review of Systems: A comprehensive 14 point review of systems was negative except as noted in the HPI.  Exam: Patient Vitals for the past 24 hrs:  BP Temp Temp src Pulse Resp SpO2 Height Weight  10/09/20 0513 95/71 (!) 97.5 F (36.4 C) Oral (!) 102 18 98 % -- --  10/08/20 1942 91/64 (!) 97.5 F (36.4 C) Oral (!) 118 17 (!) 80 % -- --  10/08/20 1505 (!) 88/67 -- -- 89 16 100 % -- --  10/08/20 1500 (!) 82/70 -- -- -- -- -- -- --  10/08/20 1458 (!) 79/68 -- -- 96 18 100 % -- --  10/08/20 1446 97/69 97.8 F (36.6 C) Oral 92 16 100 % -- --  10/08/20 1351 102/70 97.8 F (36.6 C) Oral (!) 104 18 100 % '5\' 7"'$  (1.702 m) 87.7 kg    General:  well-nourished in no acute distress.   Eyes:  no scleral icterus.   ENT:  There were no oropharyngeal lesions.     Lymphatics:  Negative cervical, supraclavicular, axillary, or inguinal adenopathy.   Breasts: No palpable masses or nipple discharge. Respiratory: lungs were clear bilaterally without wheezing or crackles.   Cardiovascular:  Regular rate and rhythm, S1/S2, without murmur, rub or gallop.  Trace bilateral lower extremity edema. GI: Upper abdomen distended with ascites, nontender, no masses. Musculoskeletal:  Strength symmetrical in the upper and lower extremities..   Skin: She has a few ecchymoses on her bilateral upper extremities. Neuro exam was nonfocal. Patient was alert and oriented.  Attention was good.   Language was appropriate.  Mood was normal without depression.  Speech was not pressured.  Thought content was not tangential.     Lab Results  Component Value Date   WBC 12.4 (H) 10/09/2020   HGB 11.3 (L) 10/09/2020   HCT 36.6 10/09/2020   PLT 286 10/09/2020   GLUCOSE 112 (H) 10/09/2020   CHOL 129 09/10/2020   TRIG 74.0 09/10/2020   HDL 35.20 (L) 09/10/2020   LDLCALC 79 09/10/2020   ALT 9 10/08/2020   AST 14 (L) 10/08/2020   NA 141 10/09/2020   K 5.3 (H) 10/09/2020   CL 108 10/09/2020   CREATININE 1.85 (H) 10/09/2020   BUN 52 (H) 10/09/2020   CO2 17 (L) 10/09/2020    CT ABDOMEN PELVIS WO CONTRAST  Result Date: 10/05/2020 CLINICAL DATA:  Abdominal distension and pain. Vomiting and diarrhea. Sepsis. EXAM: CT ABDOMEN AND PELVIS WITHOUT CONTRAST TECHNIQUE: Multidetector CT imaging of the abdomen and pelvis was performed following the standard protocol without IV contrast. COMPARISON:  None. FINDINGS: Lower chest: No acute findings. Hepatobiliary: No mass visualized on this unenhanced exam. 2 cm gallstone is noted, however there are no signs of acute cholecystitis or biliary ductal dilatation. Pancreas: No mass or inflammatory process visualized on this unenhanced exam. Spleen:  Within normal limits in size. Adrenals/Urinary tract: No evidence of urolithiasis or hydronephrosis. Unremarkable unopacified urinary bladder. Stomach/Bowel: No evidence of obstruction, inflammatory process, or abnormal fluid collections. Diverticulosis is seen mainly involving the sigmoid colon, however there is no evidence of diverticulitis. Vascular/Lymphatic: No pathologically enlarged lymph nodes identified. No evidence of abdominal aortic aneurysm. Aortic atherosclerotic calcification noted. Reproductive:  No  mass or other significant abnormality. Other: Large amount of ascites is seen throughout  the abdomen and pelvis. Musculoskeletal:  No suspicious bone lesions identified. IMPRESSION: Large amount of ascites. Cholelithiasis. No radiographic evidence of acute cholecystitis. Mild sigmoid diverticulosis, without radiographic evidence of diverticulitis. Aortic Atherosclerosis (ICD10-I70.0). Electronically Signed   By: Marlaine Hind M.D.   On: 10/05/2020 13:29   CT CHEST WO CONTRAST  Result Date: 10/07/2020 CLINICAL DATA:  Unintentional weight loss. Clinical concern for malignancy. EXAM: CT CHEST WITHOUT CONTRAST TECHNIQUE: Multidetector CT imaging of the chest was performed following the standard protocol without IV contrast. COMPARISON:  Radiograph 10/05/2020 FINDINGS: Cardiovascular: Aortic atherosclerosis. Heart is normal in size. There are coronary artery calcifications. No pericardial effusion. Mediastinum/Nodes: No enlarged mediastinal lymph nodes. Limited assessment for hilar adenopathy in this unenhanced exam. Slightly patulous esophagus. No esophageal wall thickening. No thyroid nodule. There is no axillary adenopathy. Lungs/Pleura: Chest bilateral pleural effusions. Elevation of left hemidiaphragm with adjacent compressive atelectasis at the left lung base. No confluent airspace disease. There is no dominant pulmonary mass. There are tiny bilateral pulmonary nodules within both lungs, the majority of which are subpleural or perifissural. Most of these are in the right lung, and majority are millimetric, with largest in the right upper lobe measuring 3 mm series 7, image 38, and 3 mm perifissural left upper lobe series 7, image 43. Additional tiny nodules are seen in the right lung on series 7, images 18, 28, 83 and 95. The trachea and central bronchi are patent. Upper Abdomen: Assessed on recent abdominopelvic CT. Moderate to large volume ascites again seen in the upper abdomen. Musculoskeletal: There are no  acute or suspicious osseous abnormalities. No evidence of focal bone lesion or bone destruction. Multilevel degenerative change in the spine. Minimal chest wall soft tissue edema. No dominant breast mass or soft tissue abnormality. IMPRESSION: 1. Tiny bilateral pulmonary nodules, the majority of which are subpleural or perifissural. Largest nodule measures 3 mm. Mild nonspecific, none of these nodules are specifically suspicious for malignancy. No follow-up needed if patient is low-risk (and has no known or suspected primary neoplasm). Non-contrast chest CT can be considered in 12 months if patient is high-risk. This recommendation follows the consensus statement: Guidelines for Management of Incidental Pulmonary Nodules Detected on CT Images: From the Fleischner Society 2017; Radiology 2017; 284:228-243. 2. No explanation for weight loss. No findings suspicious for intrathoracic malignancy. 3. Small bilateral pleural effusions. Elevation of left hemidiaphragm with adjacent compressive atelectasis at the left lung base. 4. Aortic atherosclerosis.  Coronary artery calcifications. Aortic Atherosclerosis (ICD10-I70.0). Electronically Signed   By: Keith Rake M.D.   On: 10/07/2020 19:55   US Paracentesis  Result Date: 10/07/2020 INDICATION: Patient with history of acute kidney injury, COVID-19, anemia, abdominal distension/recurrent ascites; request received for diagnostic and therapeutic paracentesis up to 6 liters. EXAM: ULTRASOUND GUIDED DIAGNOSTIC AND THERAPEUTIC  PARACENTESIS MEDICATIONS: 1% lidocaine to skin/SQ tissue COMPLICATIONS: None immediate. PROCEDURE: Informed written consent was obtained from the patient after a discussion of the risks, benefits and alternatives to treatment. A timeout was performed prior to the initiation of the procedure. Initial ultrasound scanning demonstrates a moderate amount of ascites within the right lower abdominal quadrant. The right lower abdomen was prepped and  draped in the usual sterile fashion. 1% lidocaine was used for local anesthesia. Following this, a 19 gauge, 10-cm, Yueh catheter was introduced. An ultrasound image was saved for documentation purposes. The paracentesis was performed. The catheter was removed and a dressing was applied. The patient tolerated the procedure well without immediate post procedural complication. Patient  currently receiving IV albumin. FINDINGS: A total of approximately 3.3 liters of hazy,dark amber fluid was removed. Samples were sent to the laboratory as requested by the clinical team. IMPRESSION: Successful ultrasound-guided diagnostic and therapeutic paracentesis yielding 3.3 liters of peritoneal fluid. Read by: Rowe Robert, PA-C Electronically Signed   By: Miachel Roux M.D.   On: 10/07/2020 14:18   DG Chest Port 1 View  Result Date: 10/05/2020 CLINICAL DATA:  Questionable sepsis EXAM: PORTABLE CHEST 1 VIEW COMPARISON:  None. FINDINGS: The patient is rotated to the right. The cardiomediastinal silhouette is within normal limits, allowing for low lung volumes and AP technique. Lung volumes are low. There is no focal consolidation or pulmonary edema. There is no pleural effusion or pneumothorax. There is no acute osseous abnormality. IMPRESSION: Low lung volumes. Otherwise, no radiographic evidence of acute cardiopulmonary process. Electronically Signed   By: Valetta Mole M.D.   On: 10/05/2020 12:26   ECHOCARDIOGRAM COMPLETE  Result Date: 10/06/2020    ECHOCARDIOGRAM REPORT   Patient Name:   Beth Sanchez Date of Exam: 10/06/2020 Medical Rec #:  [id number]      Height:       67.0 in Accession #:    [id number]     Weight:       193.3 lb Date of Birth:  1955/03/30      BSA:          1.993 m Patient Age:    63 years       BP:           94/71 mmHg Patient Gender: F              HR:           100 bpm. Exam Location:  Inpatient Procedure: 2D Echo, Cardiac Doppler and Color Doppler                       STAT ECHO Reported to: Dr Jenkins Rouge on 10/06/2020 1:26:00 PM. Indications:    Ascites [id number]  History:        Patient has no prior history of Echocardiogram examinations.                 Risk Factors:Hypertension. COVID-19 Positive.  Sonographer:    Tiffany Dance RVT Referring Phys: [id number] [place]  1. Ascites noted on sub costal images Would not appear to be cardiac related.  2. Left ventricular ejection fraction, by estimation, is 60 to 65%. The left ventricle has normal function. The left ventricle has no regional wall motion abnormalities. Left ventricular diastolic parameters are consistent with Grade I diastolic dysfunction (impaired relaxation).  3. Right ventricular systolic function is normal. The right ventricular size is normal. There is normal pulmonary artery systolic pressure.  4. Left atrial size was mildly dilated.  5. The mitral valve is abnormal. No evidence of mitral valve regurgitation. No evidence of mitral stenosis.  6. The aortic valve is tricuspid. There is mild calcification of the aortic valve. Aortic valve regurgitation is not visualized. Mild aortic valve sclerosis is present, with no evidence of aortic valve stenosis.  7. The inferior vena cava is normal in size with greater than 50% respiratory variability, suggesting right atrial pressure of 3 mmHg. FINDINGS  Left Ventricle: Left ventricular ejection fraction, by estimation, is 60 to 65%. The left ventricle has normal function. The left ventricle has no regional wall motion abnormalities. The left ventricular internal cavity size was normal in  size. There is  no left ventricular hypertrophy. Left ventricular diastolic parameters are consistent with Grade I diastolic dysfunction (impaired relaxation). Right Ventricle: The right ventricular size is normal. No increase in right ventricular wall thickness. Right ventricular systolic function is normal. There is normal pulmonary artery systolic pressure. The tricuspid regurgitant velocity is  2.02 m/s, and  with an assumed right atrial pressure of 3 mmHg, the estimated right ventricular systolic pressure is 123456 mmHg. Left Atrium: Left atrial size was mildly dilated. Right Atrium: Right atrial size was normal in size. Pericardium: There is no evidence of pericardial effusion. Mitral Valve: The mitral valve is abnormal. There is mild thickening of the mitral valve leaflet(s). There is mild calcification of the mitral valve leaflet(s). Mild mitral annular calcification. No evidence of mitral valve regurgitation. No evidence of mitral valve stenosis. Tricuspid Valve: The tricuspid valve is normal in structure. Tricuspid valve regurgitation is mild . No evidence of tricuspid stenosis. Aortic Valve: The aortic valve is tricuspid. There is mild calcification of the aortic valve. Aortic valve regurgitation is not visualized. Mild aortic valve sclerosis is present, with no evidence of aortic valve stenosis. Pulmonic Valve: The pulmonic valve was normal in structure. Pulmonic valve regurgitation is not visualized. No evidence of pulmonic stenosis. Aorta: The aortic root is normal in size and structure. Venous: The inferior vena cava is normal in size with greater than 50% respiratory variability, suggesting right atrial pressure of 3 mmHg. IAS/Shunts: No atrial level shunt detected by color flow Doppler. Additional Comments: Ascites noted on sub costal images Would not appear to be cardiac related.  LEFT VENTRICLE PLAX 2D LVIDd:         3.70 cm  Diastology LVIDs:         2.30 cm  LV e' medial:    7.40 cm/s LV PW:         1.10 cm  LV E/e' medial:  9.8 LV IVS:        1.00 cm  LV e' lateral:   10.40 cm/s LVOT diam:     2.10 cm  LV E/e' lateral: 6.9 LV SV:         63 LV SV Index:   31 LVOT Area:     3.46 cm  RIGHT VENTRICLE             IVC RV Basal diam:  2.30 cm     IVC diam: 1.80 cm RV S prime:     12.10 cm/s TAPSE (M-mode): 2.0 cm LEFT ATRIUM             Index       RIGHT ATRIUM          Index LA diam:         3.80 cm 1.91 cm/m  RA Area:     8.79 cm LA Vol (A2C):   27.3 ml 13.70 ml/m RA Volume:   15.70 ml 7.88 ml/m LA Vol (A4C):   39.1 ml 19.62 ml/m LA Biplane Vol: 33.2 ml 16.66 ml/m  AORTIC VALVE LVOT Vmax:   130.00 cm/s LVOT Vmean:  81.300 cm/s LVOT VTI:    0.180 m  AORTA Ao Root diam: 3.20 cm Ao Asc diam:  3.30 cm MITRAL VALVE                TRICUSPID VALVE MV Area (PHT): 4.31 cm     TR Peak grad:   16.3 mmHg MV Decel Time: 176 msec     TR Vmax:  202.00 cm/s MV E velocity: 72.20 cm/s MV A velocity: 122.00 cm/s  SHUNTS MV E/A ratio:  0.59         Systemic VTI:  0.18 m                             Systemic Diam: 2.10 cm Jenkins Rouge MD Electronically signed by Jenkins Rouge MD Signature Date/Time: 10/06/2020/1:35:19 PM    Final    US Abdomen Limited RUQ (LIVER/GB)  Result Date: 10/05/2020 CLINICAL DATA:  Right upper quadrant abdominal pain EXAM: ULTRASOUND ABDOMEN LIMITED RIGHT UPPER QUADRANT COMPARISON:  10/05/2020 CT abdomen/pelvis FINDINGS: Gallbladder: Nondistended gallbladder is completely filled with a densely shadowing 3.6 cm gallstone. No gallbladder wall thickening. No sonographic Murphy's sign. Common bile duct: Diameter: 6 mm on limited views, top-normal Liver: Coarsened liver parenchyma with questionable liver surface irregularity, which may indicate cirrhosis. No liver mass demonstrated on these limited views. Portal vein is patent on color Doppler imaging with normal direction of blood flow towards the liver. Other: Large volume ascites in the upper abdomen. IMPRESSION: 1. Cholelithiasis.  No evidence of acute cholecystitis. 2. Top normal caliber common bile duct, 6 mm diameter. 3. Coarsened liver parenchyma with questionable liver surface irregularity, which may indicate cirrhosis. No liver mass detected on these limited views. Suggest correlation with liver function tests. 4. Large volume upper abdominal ascites. Electronically Signed   By: Ilona Sorrel M.D.   On: 10/05/2020 14:37     CT  ABDOMEN PELVIS WO CONTRAST  Result Date: 10/05/2020 CLINICAL DATA:  Abdominal distension and pain. Vomiting and diarrhea. Sepsis. EXAM: CT ABDOMEN AND PELVIS WITHOUT CONTRAST TECHNIQUE: Multidetector CT imaging of the abdomen and pelvis was performed following the standard protocol without IV contrast. COMPARISON:  None. FINDINGS: Lower chest: No acute findings. Hepatobiliary: No mass visualized on this unenhanced exam. 2 cm gallstone is noted, however there are no signs of acute cholecystitis or biliary ductal dilatation. Pancreas: No mass or inflammatory process visualized on this unenhanced exam. Spleen:  Within normal limits in size. Adrenals/Urinary tract: No evidence of urolithiasis or hydronephrosis. Unremarkable unopacified urinary bladder. Stomach/Bowel: No evidence of obstruction, inflammatory process, or abnormal fluid collections. Diverticulosis is seen mainly involving the sigmoid colon, however there is no evidence of diverticulitis. Vascular/Lymphatic: No pathologically enlarged lymph nodes identified. No evidence of abdominal aortic aneurysm. Aortic atherosclerotic calcification noted. Reproductive:  No mass or other significant abnormality. Other: Large amount of ascites is seen throughout the abdomen and pelvis. Musculoskeletal:  No suspicious bone lesions identified. IMPRESSION: Large amount of ascites. Cholelithiasis. No radiographic evidence of acute cholecystitis. Mild sigmoid diverticulosis, without radiographic evidence of diverticulitis. Aortic Atherosclerosis (ICD10-I70.0). Electronically Signed   By: Marlaine Hind M.D.   On: 10/05/2020 13:29   CT CHEST WO CONTRAST  Result Date: 10/07/2020 CLINICAL DATA:  Unintentional weight loss. Clinical concern for malignancy. EXAM: CT CHEST WITHOUT CONTRAST TECHNIQUE: Multidetector CT imaging of the chest was performed following the standard protocol without IV contrast. COMPARISON:  Radiograph 10/05/2020 FINDINGS: Cardiovascular: Aortic  atherosclerosis. Heart is normal in size. There are coronary artery calcifications. No pericardial effusion. Mediastinum/Nodes: No enlarged mediastinal lymph nodes. Limited assessment for hilar adenopathy in this unenhanced exam. Slightly patulous esophagus. No esophageal wall thickening. No thyroid nodule. There is no axillary adenopathy. Lungs/Pleura: Chest bilateral pleural effusions. Elevation of left hemidiaphragm with adjacent compressive atelectasis at the left lung base. No confluent airspace disease. There is no dominant pulmonary mass. There  are tiny bilateral pulmonary nodules within both lungs, the majority of which are subpleural or perifissural. Most of these are in the right lung, and majority are millimetric, with largest in the right upper lobe measuring 3 mm series 7, image 38, and 3 mm perifissural left upper lobe series 7, image 43. Additional tiny nodules are seen in the right lung on series 7, images 18, 28, 83 and 95. The trachea and central bronchi are patent. Upper Abdomen: Assessed on recent abdominopelvic CT. Moderate to large volume ascites again seen in the upper abdomen. Musculoskeletal: There are no acute or suspicious osseous abnormalities. No evidence of focal bone lesion or bone destruction. Multilevel degenerative change in the spine. Minimal chest wall soft tissue edema. No dominant breast mass or soft tissue abnormality. IMPRESSION: 1. Tiny bilateral pulmonary nodules, the majority of which are subpleural or perifissural. Largest nodule measures 3 mm. Mild nonspecific, none of these nodules are specifically suspicious for malignancy. No follow-up needed if patient is low-risk (and has no known or suspected primary neoplasm). Non-contrast chest CT can be considered in 12 months if patient is high-risk. This recommendation follows the consensus statement: Guidelines for Management of Incidental Pulmonary Nodules Detected on CT Images: From the Fleischner Society 2017; Radiology  2017; 284:228-243. 2. No explanation for weight loss. No findings suspicious for intrathoracic malignancy. 3. Small bilateral pleural effusions. Elevation of left hemidiaphragm with adjacent compressive atelectasis at the left lung base. 4. Aortic atherosclerosis.  Coronary artery calcifications. Aortic Atherosclerosis (ICD10-I70.0). Electronically Signed   By: Keith Rake M.D.   On: 10/07/2020 19:55   US Paracentesis  Result Date: 10/07/2020 INDICATION: Patient with history of acute kidney injury, COVID-19, anemia, abdominal distension/recurrent ascites; request received for diagnostic and therapeutic paracentesis up to 6 liters. EXAM: ULTRASOUND GUIDED DIAGNOSTIC AND THERAPEUTIC  PARACENTESIS MEDICATIONS: 1% lidocaine to skin/SQ tissue COMPLICATIONS: None immediate. PROCEDURE: Informed written consent was obtained from the patient after a discussion of the risks, benefits and alternatives to treatment. A timeout was performed prior to the initiation of the procedure. Initial ultrasound scanning demonstrates a moderate amount of ascites within the right lower abdominal quadrant. The right lower abdomen was prepped and draped in the usual sterile fashion. 1% lidocaine was used for local anesthesia. Following this, a 19 gauge, 10-cm, Yueh catheter was introduced. An ultrasound image was saved for documentation purposes. The paracentesis was performed. The catheter was removed and a dressing was applied. The patient tolerated the procedure well without immediate post procedural complication. Patient currently receiving IV albumin. FINDINGS: A total of approximately 3.3 liters of hazy,dark amber fluid was removed. Samples were sent to the laboratory as requested by the clinical team. IMPRESSION: Successful ultrasound-guided diagnostic and therapeutic paracentesis yielding 3.3 liters of peritoneal fluid. Read by: Rowe Robert, PA-C Electronically Signed   By: Miachel Roux M.D.   On: 10/07/2020 14:18   DG  Chest Port 1 View  Result Date: 10/05/2020 CLINICAL DATA:  Questionable sepsis EXAM: PORTABLE CHEST 1 VIEW COMPARISON:  None. FINDINGS: The patient is rotated to the right. The cardiomediastinal silhouette is within normal limits, allowing for low lung volumes and AP technique. Lung volumes are low. There is no focal consolidation or pulmonary edema. There is no pleural effusion or pneumothorax. There is no acute osseous abnormality. IMPRESSION: Low lung volumes. Otherwise, no radiographic evidence of acute cardiopulmonary process. Electronically Signed   By: Valetta Mole M.D.   On: 10/05/2020 12:26   ECHOCARDIOGRAM COMPLETE  Result Date: 10/06/2020  ECHOCARDIOGRAM REPORT   Patient Name:   ANALUIZA MCNEARY Date of Exam: 10/06/2020 Medical Rec #:  [id number]      Height:       67.0 in Accession #:    [id number]     Weight:       193.3 lb Date of Birth:  [birth year]      BSA:          1.993 m Patient Age:    61 years       BP:           94/71 mmHg Patient Gender: F              HR:           100 bpm. Exam Location:  Inpatient Procedure: 2D Echo, Cardiac Doppler and Color Doppler                       STAT ECHO Reported to: Dr Jenkins Rouge on 10/06/2020 1:26:00 PM. Indications:    Ascites [id number]  History:        Patient has no prior history of Echocardiogram examinations.                 Risk Factors:Hypertension. COVID-19 Positive.  Sonographer:    Tiffany Dance RVT Referring Phys: [id number] [place]  1. Ascites noted on sub costal images Would not appear to be cardiac related.  2. Left ventricular ejection fraction, by estimation, is 60 to 65%. The left ventricle has normal function. The left ventricle has no regional wall motion abnormalities. Left ventricular diastolic parameters are consistent with Grade I diastolic dysfunction (impaired relaxation).  3. Right ventricular systolic function is normal. The right ventricular size is normal. There is normal pulmonary artery systolic  pressure.  4. Left atrial size was mildly dilated.  5. The mitral valve is abnormal. No evidence of mitral valve regurgitation. No evidence of mitral stenosis.  6. The aortic valve is tricuspid. There is mild calcification of the aortic valve. Aortic valve regurgitation is not visualized. Mild aortic valve sclerosis is present, with no evidence of aortic valve stenosis.  7. The inferior vena cava is normal in size with greater than 50% respiratory variability, suggesting right atrial pressure of 3 mmHg. FINDINGS  Left Ventricle: Left ventricular ejection fraction, by estimation, is 60 to 65%. The left ventricle has normal function. The left ventricle has no regional wall motion abnormalities. The left ventricular internal cavity size was normal in size. There is  no left ventricular hypertrophy. Left ventricular diastolic parameters are consistent with Grade I diastolic dysfunction (impaired relaxation). Right Ventricle: The right ventricular size is normal. No increase in right ventricular wall thickness. Right ventricular systolic function is normal. There is normal pulmonary artery systolic pressure. The tricuspid regurgitant velocity is 2.02 m/s, and  with an assumed right atrial pressure of 3 mmHg, the estimated right ventricular systolic pressure is 123456 mmHg. Left Atrium: Left atrial size was mildly dilated. Right Atrium: Right atrial size was normal in size. Pericardium: There is no evidence of pericardial effusion. Mitral Valve: The mitral valve is abnormal. There is mild thickening of the mitral valve leaflet(s). There is mild calcification of the mitral valve leaflet(s). Mild mitral annular calcification. No evidence of mitral valve regurgitation. No evidence of mitral valve stenosis. Tricuspid Valve: The tricuspid valve is normal in structure. Tricuspid valve regurgitation is mild . No evidence of tricuspid stenosis. Aortic Valve: The aortic valve is tricuspid. There  is mild calcification of the aortic  valve. Aortic valve regurgitation is not visualized. Mild aortic valve sclerosis is present, with no evidence of aortic valve stenosis. Pulmonic Valve: The pulmonic valve was normal in structure. Pulmonic valve regurgitation is not visualized. No evidence of pulmonic stenosis. Aorta: The aortic root is normal in size and structure. Venous: The inferior vena cava is normal in size with greater than 50% respiratory variability, suggesting right atrial pressure of 3 mmHg. IAS/Shunts: No atrial level shunt detected by color flow Doppler. Additional Comments: Ascites noted on sub costal images Would not appear to be cardiac related.  LEFT VENTRICLE PLAX 2D LVIDd:         3.70 cm  Diastology LVIDs:         2.30 cm  LV e' medial:    7.40 cm/s LV PW:         1.10 cm  LV E/e' medial:  9.8 LV IVS:        1.00 cm  LV e' lateral:   10.40 cm/s LVOT diam:     2.10 cm  LV E/e' lateral: 6.9 LV SV:         63 LV SV Index:   31 LVOT Area:     3.46 cm  RIGHT VENTRICLE             IVC RV Basal diam:  2.30 cm     IVC diam: 1.80 cm RV S prime:     12.10 cm/s TAPSE (M-mode): 2.0 cm LEFT ATRIUM             Index       RIGHT ATRIUM          Index LA diam:        3.80 cm 1.91 cm/m  RA Area:     8.79 cm LA Vol (A2C):   27.3 ml 13.70 ml/m RA Volume:   15.70 ml 7.88 ml/m LA Vol (A4C):   39.1 ml 19.62 ml/m LA Biplane Vol: 33.2 ml 16.66 ml/m  AORTIC VALVE LVOT Vmax:   130.00 cm/s LVOT Vmean:  81.300 cm/s LVOT VTI:    0.180 m  AORTA Ao Root diam: 3.20 cm Ao Asc diam:  3.30 cm MITRAL VALVE                TRICUSPID VALVE MV Area (PHT): 4.31 cm     TR Peak grad:   16.3 mmHg MV Decel Time: 176 msec     TR Vmax:        202.00 cm/s MV E velocity: 72.20 cm/s MV A velocity: 122.00 cm/s  SHUNTS MV E/A ratio:  0.59         Systemic VTI:  0.18 m                             Systemic Diam: 2.10 cm Jenkins Rouge MD Electronically signed by Jenkins Rouge MD Signature Date/Time: 10/06/2020/1:35:19 PM    Final    US Abdomen Limited RUQ (LIVER/GB)  Result  Date: 10/05/2020 CLINICAL DATA:  Right upper quadrant abdominal pain EXAM: ULTRASOUND ABDOMEN LIMITED RIGHT UPPER QUADRANT COMPARISON:  10/05/2020 CT abdomen/pelvis FINDINGS: Gallbladder: Nondistended gallbladder is completely filled with a densely shadowing 3.6 cm gallstone. No gallbladder wall thickening. No sonographic Murphy's sign. Common bile duct: Diameter: 6 mm on limited views, top-normal Liver: Coarsened liver parenchyma with questionable liver surface irregularity, which may indicate cirrhosis. No liver mass demonstrated on these  limited views. Portal vein is patent on color Doppler imaging with normal direction of blood flow towards the liver. Other: Large volume ascites in the upper abdomen. IMPRESSION: 1. Cholelithiasis.  No evidence of acute cholecystitis. 2. Top normal caliber common bile duct, 6 mm diameter. 3. Coarsened liver parenchyma with questionable liver surface irregularity, which may indicate cirrhosis. No liver mass detected on these limited views. Suggest correlation with liver function tests. 4. Large volume upper abdominal ascites. Electronically Signed   By: Ilona Sorrel M.D.   On: 10/05/2020 14:37    Pathology:  Cytology - Non PAP  CASE: [id number]  PATIENT: Beth Sanchez  Non-Gynecological Cytology Report   Clinical History: None provided  Specimen Submitted:  A. ASCITES, PARACENTESIS:    FINAL MICROSCOPIC DIAGNOSIS:  - Malignant cells present  - See comment   SPECIMEN ADEQUACY:  Satisfactory for evaluation   DIAGNOSTIC COMMENTS:  The malignant cells are positive for cytokeraitn 7, Pax-8 and CA-IX but  negative for cytokeratin 20, TTF-1, Gata-3, and CDX-2.  Additional  immunohistochemistry (D2-40, WT1, MOC-31, and calretinin) is pending and  will be reported in an addendum.   Assessment and Plan:  1.  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  Iron deficiency anemia 3.  Mild leukocytosis 4.  AKI 5.  Protein calorie malnutrition/weight loss 6.   COVID-19 infection, asymptomatic 7.  Diabetes mellitus 8.  Hypertension  -Reviewed work-up to date including imaging findings, cytology results, and tumor markers with the patient.  We discussed that cytology results are indicative of malignancy but primary site has yet to be identified.  The CA125 is elevated, but this is not specific for ovarian cancer.  Suspect that she has a malignancy of GI or GYN origin.  Would be helpful if patient could complete colonoscopy to rule this out.  Further recommendations regarding imaging and work-up per Dr. Marin Olp. -We will follow-up with pathology regarding pending IHC stains. -Proceed with ultrasound-guided paracentesis as needed for symptomatic relief. -Hemoglobin is currently stable.  Status post a dose of IV iron.  Monitor hemoglobin closely. -She has mild leukocytosis, likely reactive.  She is afebrile.  Monitor. -Likely due to hypotension in the setting of Zestoretic and Jardiance.  Zestoretic currently on hold.  Renal function is improving.  Monitor. -Dietitian is currently following for malnutrition/weight loss. -She is currently asymptomatic from her COVID-19 infection.  Further management if needed per hospitalist. -Management of chronic medical conditions per hospitalist.  Thank you for this referral.   Mikey Bussing, DNP, AGPCNP-BC, AOCNP   ADDENDUM: I saw and examined Beth Sanchez.  I agree with the above.  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  The CA-125 is incredibly high.  She has a normal CA 19-9 and normal CEA.  I think that a transvaginal ultrasound would not be a bad idea to get a better look at her ovaries.  I think that she would be a candidate for systemic chemotherapy.  We basically use a protocol that is similar to ovarian cancer.  I would probably utilize carboplatinum/Taxol/Avastin.  A lot of times with primary peritoneal carcinoma, Avastin really helps decrease the ascites production.  I  suppose that if we see a very good response, she might be considered a surgical candidate for any type of "debulking.".  I know this is somewhat controversial.  I talked to her at length.  She does seem to be in good spirits.  She is seems to have a pretty decent  performance status.  I think she will need to have a Port-A-Cath placed.  Unfortunately, she is COVID-positive.  As such, I am not sure if interventional radiology would put a Port-A-Cath into her right now.  I will have to talk to the staff on 6 E. to see if they would consider any chemotherapy for her on the floor.  I know this is somewhat complicated.  I think the only way were going to decrease the ascites production just to give chemotherapy.  Again, I talked to her about chemotherapy.  She is agreeable to take treatment.  I do like her attitude.  We will follow along and help out in any way possible.  Lattie Haw, MD  [place] 4:10

## 2020-10-10 ENCOUNTER — Inpatient Hospital Stay (HOSPITAL_COMMUNITY): Payer: Medicare Other

## 2020-10-10 ENCOUNTER — Encounter (HOSPITAL_COMMUNITY): Payer: Self-pay | Admitting: Anesthesiology

## 2020-10-10 ENCOUNTER — Encounter (HOSPITAL_COMMUNITY)
Admission: EM | Disposition: A | Discharge status: Skilled Nursing Facility | Payer: Self-pay | Source: Home / Self Care | Attending: Internal Medicine

## 2020-10-10 DIAGNOSIS — R180 Malignant ascites: Secondary | ICD-10-CM | POA: Diagnosis not present

## 2020-10-10 DIAGNOSIS — I10 Essential (primary) hypertension: Secondary | ICD-10-CM | POA: Diagnosis not present

## 2020-10-10 DIAGNOSIS — K7460 Unspecified cirrhosis of liver: Secondary | ICD-10-CM | POA: Diagnosis not present

## 2020-10-10 DIAGNOSIS — N179 Acute kidney failure, unspecified: Secondary | ICD-10-CM | POA: Diagnosis not present

## 2020-10-10 DIAGNOSIS — K567 Ileus, unspecified: Secondary | ICD-10-CM

## 2020-10-10 DIAGNOSIS — R188 Other ascites: Secondary | ICD-10-CM | POA: Diagnosis not present

## 2020-10-10 LAB — CBC WITH DIFFERENTIAL/PLATELET
Abs Immature Granulocytes: 0.07 10*3/uL (ref 0.00–0.07)
Basophils Absolute: 0 10*3/uL (ref 0.0–0.1)
Basophils Relative: 0 %
Eosinophils Absolute: 0 10*3/uL (ref 0.0–0.5)
Eosinophils Relative: 0 %
HCT: 34.6 % — ABNORMAL LOW (ref 36.0–46.0)
Hemoglobin: 10.8 g/dL — ABNORMAL LOW (ref 12.0–15.0)
Immature Granulocytes: 1 %
Lymphocytes Relative: 4 %
Lymphs Abs: 0.3 10*3/uL — ABNORMAL LOW (ref 0.7–4.0)
MCH: 26.7 pg (ref 26.0–34.0)
MCHC: 31.2 g/dL (ref 30.0–36.0)
MCV: 85.6 fL (ref 80.0–100.0)
Monocytes Absolute: 0.3 10*3/uL (ref 0.1–1.0)
Monocytes Relative: 4 %
Neutro Abs: 8.6 10*3/uL — ABNORMAL HIGH (ref 1.7–7.7)
Neutrophils Relative %: 91 %
Platelets: 258 10*3/uL (ref 150–400)
RBC: 4.04 MIL/uL (ref 3.87–5.11)
RDW: 16.4 % — ABNORMAL HIGH (ref 11.5–15.5)
WBC: 9.4 10*3/uL (ref 4.0–10.5)
nRBC: 0 % (ref 0.0–0.2)

## 2020-10-10 LAB — RENAL FUNCTION PANEL
Albumin: 3.2 g/dL — ABNORMAL LOW (ref 3.5–5.0)
Anion gap: 11 (ref 5–15)
BUN: 52 mg/dL — ABNORMAL HIGH (ref 8–23)
CO2: 20 mmol/L — ABNORMAL LOW (ref 22–32)
Calcium: 9.5 mg/dL (ref 8.9–10.3)
Chloride: 106 mmol/L (ref 98–111)
Creatinine, Ser: 1.73 mg/dL — ABNORMAL HIGH (ref 0.44–1.00)
GFR, Estimated: 32 mL/min — ABNORMAL LOW (ref >60)
Glucose, Bld: 89 mg/dL (ref 70–99)
Phosphorus: 4 mg/dL (ref 2.5–4.6)
Potassium: 5 mmol/L (ref 3.5–5.1)
Sodium: 137 mmol/L (ref 135–145)

## 2020-10-10 LAB — MAGNESIUM: Magnesium: 2 mg/dL (ref 1.7–2.4)

## 2020-10-10 LAB — GLUCOSE, CAPILLARY
Glucose-Capillary: 82 mg/dL (ref 70–99)
Glucose-Capillary: 87 mg/dL (ref 70–99)
Glucose-Capillary: 91 mg/dL (ref 70–99)

## 2020-10-10 LAB — CULTURE, BODY FLUID W GRAM STAIN -BOTTLE
Culture: NO GROWTH
Special Requests: ADEQUATE

## 2020-10-10 LAB — CULTURE, BLOOD (ROUTINE X 2)
Culture: NO GROWTH
Culture: NO GROWTH

## 2020-10-10 SURGERY — COLONOSCOPY WITH PROPOFOL
Anesthesia: Monitor Anesthesia Care

## 2020-10-10 MED ORDER — BISACODYL 10 MG RE SUPP
10.0000 mg | Freq: Once | RECTAL | Status: AC
  Administered 2020-10-10: 10 mg via RECTAL
  Filled 2020-10-10: qty 1

## 2020-10-10 NOTE — Plan of Care (Signed)
  Problem: Clinical Measurements: Goal: Ability to maintain clinical measurements within normal limits will improve Outcome: Progressing   

## 2020-10-10 NOTE — Progress Notes (Signed)
Patient with abdomen distention vomited large amount of gastric contents. Reports last BM 1 weeks ago. Order given to place NGT. After several attempts from day shift patient continues to refuse. Explained to her purpose of NGT for decompression and for bowel prep for procedure. She adamantly refuse NGT. Zofran prn given; notified provider on call.

## 2020-10-10 NOTE — Progress Notes (Signed)
Dr. Lyndel Safe notified of failed attempt x3 , 2 myself and one by an additional ICU RN. Pt  not tolerating well, very anxious and states uncomfortable. Refuse to leave in place when achieved by myself and ICU RN. MD acknowledged and states IR to try.

## 2020-10-10 NOTE — Progress Notes (Signed)
PROGRESS NOTE    Beth Sanchez  VXB:[id number] DOB: [birth year] DOA: 10/05/2020 PCP: Pcp, No    Chief Complaint  Patient presents with   Abdominal Pain    Vomiting, diarrhea    Brief Narrative:  65 year old F with PMH of HTN, IDDM-2 and obesity presenting with increasing abdominal pain, distention, lower back pain, emesis and fatigue, and found to have liver cirrhosis with ascites, AKI, hypotension, mild hyponatremia, and COVID-19 infection.  She had IR perform paracentesis with removal of 3.5 L.  Had leukocytosis to 15.2.  Started on IV ceftriaxone until SBP is excluded.  Cr 3.34.  She had no respiratory symptoms in regards to a COVID-19 infection.  GI consulted.   Patient never had colonoscopy.  No history of GI bleed.  Quit drinking about 30 years ago.     Assessment & Plan:   Principal Problem:   Ascites Active Problems:   Essential hypertension   Hyperlipidemia associated with type 2 diabetes mellitus (HCC)   Type 2 diabetes mellitus with hyperglycemia (HCC)   Cirrhosis of liver (HCC)   Cirrhosis (HCC)   Iron deficiency anemia   AKI (acute kidney injury) ([place])   Hyperkalemia   Hyponatremia   Gastritis  #1 large malignant ascites -Patient presenting with abdominal distention, CT abdomen and pelvis as well as abdominal ultrasound consistent with large volume ascites. -Status post paracentesis with 3.5 L of fluid removed. -Patient with aSAAG < 1.1 arguing against portal hypertension. -Body fluid cultures with no growth to date with low probability for SBP -Leukocytosis trending down. -No significant exquisite tenderness to palpation. -Peritoneal fluid LDH elevated. -Patient seems to have had some reaccumulation of abdominal ascites and subsequently underwent repeat ultrasound-guided paracentesis 10/07/2020 with 3.3 L of fluid removed. -Cytology consistent with malignant cells. -CT chest with tiny pulmonary nodules and no significant mass or malignancy noted.   -CA 125  elevated at 7281.   -CA 19-9 at 7, CEA at 2.2, AFP at 1.8  -Status post IV albumin.   -Patient was on IV Rocephin which has subsequently been discontinued as SBP highly unlikely -Patient status post upper endoscopy which showed small hiatal hernia, reflux and gastritis.  -Due to elevated CA 125 levels, concern for malignant ascites likely from ovarian source oncology consulted. -Colonoscopy attempted however due to poor bowel prep was canceled.  -Per GI oncology would like to get colonoscopy if possible and as such NG tube ordered by GI to prep and perform colonoscopy, however patient refused NG tube placement and unable to tolerate prep. -Colonoscopy subsequently canceled during this hospitalization per GI -Oncology recommended transvaginal ultrasound which has been ordered and showing a uterine fundal mass likely a fibroid, right ovarian minimally complex cystic lesion indeterminate, lack of visualization of left ovary. -Paracentesis as needed. -GI following and appreciate input and recommendations..  2.  Acute kidney injury -Likely secondary to prerenal azotemia from hypotension in the setting of Zestoretic, Jardiance. -Fractional excretion of sodium of 0.9% suggesting a prerenal etiology. -Urinalysis with trace leukocytes, nitrite negative, negative protein arguing against nephrotic syndrome. -Zestoretic on hold. -Urine output not properly recorded. -Renal function was improving and started to trend back up however trending back down and currently at 1.73 from 1.85 from 1.23. -Patient unable to tolerate prep and refused G-tube placement. -Continue IV fluids, avoid nephrotoxic agents. -Follow.  3.  Hypotension -In the setting of cirrhosis with ascites and dehydration from GI loss. -BP improved however somewhat soft.  -Continue to hold antihypertensive medications.  -Patient pancultured with blood  cultures pending with no growth to date. -Peritoneal fluid cultures negative to  date. -Urine cultures with 10,000 colonies of Staphylococcus stimulants likely of no significance. -Blood cultures with no growth to date. -Status post IV albumin. -BP borderline this morning/soft, bump in creatinine likely secondary to dehydration. -Continue hydration with IV fluids.   -Follow.  4.  Hyperkalemia -Received a dose of Lokelma early on in the hospitalization with improvement with hyperkalemia initially and then subsequently developing hyperkalemia again with a potassium of 5.5 and received another dose of Lokelma.  Repeat potassium at 5.  Repeat labs in the morning.    5.  Hyponatremia -Likely secondary to hypervolemia due to concerns for cirrhosis and abdominal ascites in the setting of Zestoretic. -Zestoretic on hold. -Sodium levels at 137.  6.  Non-anion gap metabolic acidosis likely from AKI -Improved with hydration  7.  Incidental COVID infection -Patient noted to have tested positive on 10/05/2020. -Patient noted to be vaccinated. -Patient with oxygen sats of 100% on room air.   -No indication for treatment.   -Continue isolation precautions for total of 10 days.   8.  Non-insulin-dependent diabetes mellitus type 2 -Noted to be on Jardiance and metformin prior to admission which are currently on hold in the setting of AKI. -Hemoglobin A1c 5.9 (10/07/2020) -CBG 82.   -Oral hypoglycemic agents of metformin and Jardiance on hold.   -SSI discontinued.    9.  Iron deficiency anemia -Anemia panel with iron level of 19, ferritin of 511. -Hemoglobin currently at 11.3 from 10.9 from 9.7 from 11.6 on admission. -Patient status post EGD 10/08/2020 which showed small hiatal hernia, reflux and gastritis.   -Colonoscopy canceled due to poor bowel prep.   -Status post IV iron 10/08/2020.   -Transfusion threshold hemoglobin < 7.  -Patient to be prepped again tonight for probable colonoscopy tomorrow. -Per GI.   10.  Elevated D-dimer -Felt likely secondary to COVID-19  infection. -Continue DVT prophylaxis.    11.  Elevated CA 125 -Patient with elevated levels of CA125 of 7281. -Cytology from paracentesis within malignant cells.   -Concern for ovarian malignancy. 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 -Oncology feels may benefit from a transvaginal ultrasound for further evaluation of ovaries which has been ordered and showing a uterine fundal mass likely a fibroid, right ovarian minimally complex cystic lesion indeterminate, lack of visualization of left ovary.. -Per oncology patient likely a candidate for systemic chemotherapy. -Per oncology.  12.?  Ileus -Patient with emesis overnight.  Patient with hypoactive bowel sounds. -Patient with abdominal distention. -Patient denies any flatus or bowel movement. -Patient frustrated insistent on being fed. -Patient stated gastroenterologist states she was fine for a diet. -Patient started on full liquids. -Monitor.   DVT prophylaxis: Heparin Code Status: Full Family Communication: Updated patient.  No family at bedside. Disposition:   Status is: Inpatient  Remains inpatient appropriate because:Inpatient level of care appropriate due to severity of illness  Dispo: The patient is from: Home              Anticipated d/c is to: Home              Patient currently is not medically stable to d/c.   Difficult to place patient No       Consultants:  Gastroenterology: Dr. Lyndel Safe 10/06/2020 Oncology: Dr. Marin Olp 10/09/2020  Procedures:  Renal ultrasound 10/05/2020 Right upper quadrant abdominal ultrasound 10/05/2020 Chest x-ray 10/05/2020 CT abdomen and pelvis  10/05/2020 2D echo 10/06/2020 Ultrasound-guided paracentesis -3.3 L of hazy dark amber fluid removed per IR, Darrell Allred, PA 10/07/2020 Ultrasound-guided paracentesis 10/05/2020 per Darrell Allred, PA IR with 3.5 L of hazy fluid removed. Upper endoscopy 10/08/2020 CT chest  10/07/2020   Antimicrobials: IV Rocephin 10/05/2020>>>>>> 10/08/2020 IV Flagyl 10/05/2020 x 1 dose   Subjective: Patient laying in bed.  Stated had bouts of emesis overnight.  Adamantly refused NG tube placement last night as well as this morning.  Stating feeling a little bit better this morning.  Denies any flatus.  No bowel movement.  Frustrated that she is not being allowed to eat.  States gastroenterologist says she could eat a regular diet.    Objective: Vitals:   10/09/20 2057 10/10/20 0026 10/10/20 0533 10/10/20 1210  BP: 110/80 101/79 98/70 117/80  Pulse: (!) 107 (!) 107 (!) 104 (!) 118  Resp: (!) '24 19  18  ' Temp: 97.8 F (36.6 C) 97.7 F (36.5 C) 97.7 F (36.5 C) 97.9 F (36.6 C)  TempSrc: Oral Oral Axillary Oral  SpO2: 98% 97% 96% 100%  Weight:      Height:        Intake/Output Summary (Last 24 hours) at 10/10/2020 1318 Last data filed at 10/09/2020 2000 Gross per 24 hour  Intake 1212.51 ml  Output 1 ml  Net 1211.51 ml    Filed Weights   10/06/20 0000 10/08/20 1351  Weight: 87.7 kg 87.7 kg    Examination:  General exam: NAD Respiratory system: Lungs clear to auscultation bilaterally.  No wheezes, no crackles, no rhonchi.  Normal respiratory effort.  Cardiovascular system: Regular rate and rhythm no murmurs rubs or gallops.  No JVD.  Trace bilateral lower extremity edema.  Gastrointestinal system: Distended, soft, decreased tenderness to palpation, hypoactive bowel sounds.  No rebound.  No guarding. Central nervous system: Alert and oriented.  Moving extremities spontaneously.  No focal neurological deficits.  Extremities: Trace BLE edema.  Skin: No rashes, lesions or ulcers Psychiatry: Judgement and insight appear normal. Mood & affect appropriate.     Data Reviewed: I have personally reviewed following labs and imaging studies  CBC: Recent Labs  Lab 10/05/20 1136 10/06/20 0339 10/07/20 0407 10/08/20 0506 10/09/20 0401 10/10/20 0416  WBC 6.6 6.6  5.0 5.1 12.4* 9.4  NEUTROABS 5.9  --   --  4.3  --  8.6*  HGB 11.6* 10.1* 9.7* 10.9* 11.3* 10.8*  HCT 36.4 32.2* 30.6* 34.4* 36.6 34.6*  MCV 82.9 84.3 83.6 84.3 84.5 85.6  PLT 375 281 275 237 286 258     Basic Metabolic Panel: Recent Labs  Lab 10/06/20 0339 10/07/20 0407 10/08/20 0506 10/09/20 0401 10/09/20 0915 10/10/20 0416  NA 133* 136  136 136 141  --  137  K 5.4* 4.6  4.6 4.6 5.3* 5.5* 5.0  CL 103 105  106 107 108  --  106  CO2 18* 21*  20* 20* 17*  --  20*  GLUCOSE 71 92  94 81 112*  --  89  BUN 77* 65*  65* 49* 52*  --  52*  CREATININE 2.28* 1.55*  1.61* 1.23* 1.85*  --  1.73*  CALCIUM 9.0 9.3  9.4 9.5 9.8  --  9.5  MG 2.1 2.1 1.9 2.0  --  2.0  PHOS 4.6 3.1  --   --   --  4.0     GFR: Estimated Creatinine Clearance: 36.8 mL/min (A) (by C-G formula based on SCr of 1.73 mg/dL (  H)).  Liver Function Tests: Recent Labs  Lab 10/05/20 1136 10/06/20 0339 10/07/20 0407 10/08/20 0506 10/10/20 0416  AST 16 13* 13* 14*  --   ALT '12 10 9 9  ' --   ALKPHOS 122 93 75 73  --   BILITOT 0.7 0.8 0.8 1.0  --   PROT 7.4 6.5 6.6 6.7  --   ALBUMIN 3.0* 3.2* 3.8  3.8 3.9 3.2*     CBG: Recent Labs  Lab 10/08/20 1935 10/09/20 1309 10/09/20 1729 10/09/20 2053 10/10/20 0523  GLUCAP 96 118* 106* 83 82      Recent Results (from the past 240 hour(s))  Blood Culture (routine x 2)     Status: None   Collection Time: 10/05/20 11:36 AM   Specimen: BLOOD  Result Value Ref Range Status   Specimen Description   Final    BLOOD LEFT ANTECUBITAL Performed at [facility], [place] [address], [place], [place] [address]    Special Requests   Final    BOTTLES DRAWN AEROBIC AND ANAEROBIC Blood Culture results may not be optimal due to an inadequate volume of blood received in culture bottles Performed at [place] [address] [place], [place] [address]    Culture   Final    NO GROWTH 5 DAYS Performed at [facility],  [place] [address] [place], [place] [address]    Report Status 10/10/2020 FINAL  Final  Resp Panel by RT-PCR (Flu A&B, Covid) Nasopharyngeal Swab     Status: Abnormal   Collection Time: 10/05/20 12:00 PM   Specimen: Nasopharyngeal Swab; Nasopharyngeal(NP) swabs in vial transport medium  Result Value Ref Range Status   SARS Coronavirus 2 by RT PCR POSITIVE (A) NEGATIVE Final    Comment: RESULT CALLED TO, READ BACK BY AND VERIFIED WITH: Halina Maidens, RN 10/05/20 1446 KDS (NOTE) SARS-CoV-2 target nucleic acids are DETECTED.  The SARS-CoV-2 RNA is generally detectable in upper respiratory specimens during the acute phase of infection. Positive results are indicative of the presence of the identified virus, but do not rule out bacterial infection or co-infection with other pathogens not detected by the test. Clinical correlation with patient history and other diagnostic information is necessary to determine patient infection status. The expected result is Negative.  Fact Sheet for Patients: EntrepreneurPulse.com.au  Fact Sheet for Healthcare Providers: IncredibleEmployment.be  This test is not yet approved or cleared by the [place] FDA and  has been authorized for detection and/or diagnosis of SARS-CoV-2 by FDA under an Emergency Use Authorization (EUA).  This EUA will remain in effect (meaning this test can be u sed) for the duration of  the COVID-19 declaration under Section 564(b)(1) of the Act, 21 U.S.C. section 360bbb-3(b)(1), unless the authorization is terminated or revoked sooner.     Influenza A by PCR NEGATIVE NEGATIVE Final   Influenza B by PCR NEGATIVE NEGATIVE Final    Comment: (NOTE) The Xpert Xpress SARS-CoV-2/FLU/RSV plus assay is intended as an aid in the diagnosis of influenza from Nasopharyngeal swab specimens and should not be used as a sole basis for treatment. Nasal washings and aspirates are unacceptable for Xpert Xpress  SARS-CoV-2/FLU/RSV testing.  Fact Sheet for Patients: EntrepreneurPulse.com.au  Fact Sheet for Healthcare Providers: IncredibleEmployment.be  This test is not yet approved or cleared by the [place] FDA and has been authorized for detection and/or diagnosis of SARS-CoV-2 by FDA under an Emergency Use Authorization (EUA). This EUA will remain in effect (meaning  this test can be used) for the duration of the COVID-19 declaration under Section 564(b)(1) of the Act, 21 U.S.C. section 360bbb-3(b)(1), unless the authorization is terminated or revoked.  Performed at [facility], [place] [address], [place], [place] [address]   Blood Culture (routine x 2)     Status: None   Collection Time: 10/05/20 12:00 PM   Specimen: BLOOD  Result Value Ref Range Status   Specimen Description   Final    BLOOD RIGHT ANTECUBITAL Performed at [place] [address], [place], [place] [address]    Special Requests   Final    BOTTLES DRAWN AEROBIC AND ANAEROBIC Blood Culture results may not be optimal due to an inadequate volume of blood received in culture bottles Performed at [place] [address], [place], [place] [address]    Culture   Final    NO GROWTH 5 DAYS Performed at [facility], [place] [address], [place], [place] [address]    Report Status 10/10/2020 FINAL  Final  Body fluid culture w Gram Stain     Status: None   Collection Time: 10/05/20  3:07 PM   Specimen: Peritoneal Cavity; Peritoneal Fluid  Result Value Ref Range Status   Specimen Description   Final    PERITONEAL CAVITY Performed at [place] [address], [place], [place] [address]    Special Requests   Final    NONE Performed at [facility], [place] [address] [place], [place] [address]    Gram Stain   Final    NO SQUAMOUS EPITHELIAL CELLS SEEN FEW WBC SEEN NO ORGANISMS  SEEN    Culture   Final    NO GROWTH Performed at [facility], [place] [address] [place], [place] [address]    Report Status 10/08/2020 FINAL  Final  Culture, body fluid w Gram Stain-bottle     Status: None   Collection Time: 10/05/20  5:02 PM   Specimen: Fluid  Result Value Ref Range Status   Specimen Description FLUID RIGHT PERITONEAL  Final   Special Requests   Final    BOTTLES DRAWN AEROBIC AND ANAEROBIC Blood Culture adequate volume   Culture   Final    NO GROWTH 5 DAYS Performed at [facility], [place] [address] [place], [place] [address]    Report Status 10/10/2020 FINAL  Final  Gram stain     Status: None   Collection Time: 10/05/20  5:02 PM   Specimen: Fluid  Result Value Ref Range Status   Specimen Description FLUID RIGHT PERITONEAL  Final   Special Requests NONE  Final   Gram Stain   Final    RARE SQUAMOUS EPITHELIAL CELLS PRESENT RARE WBC SEEN NO ORGANISMS SEEN Performed at [facility], [place] [address], [place], [place] [address]    Report Status 10/06/2020 FINAL  Final  Urine Culture     Status: Abnormal   Collection Time: 10/05/20  9:19 PM   Specimen: In/Out Cath Urine  Result Value Ref Range Status   Specimen Description   Final    IN/OUT CATH URINE Performed at [place] [address], [place], [place] [address]    Special Requests   Final    NONE Performed at [facility], [place] [address] [place], [place] [address]    Culture 10,000 COLONIES/mL STAPHYLOCOCCUS SIMULANS (A)  Final   Report Status 10/08/2020 FINAL  Final   Organism ID, Bacteria STAPHYLOCOCCUS SIMULANS (A)  Final      Susceptibility   Staphylococcus simulans - MIC*    CIPROFLOXACIN <=0.5 SENSITIVE Sensitive     GENTAMICIN <=0.5 SENSITIVE Sensitive     NITROFURANTOIN <=16 SENSITIVE Sensitive     OXACILLIN <=0.25 SENSITIVE Sensitive     TETRACYCLINE <=1 SENSITIVE Sensitive     VANCOMYCIN <=0.5 SENSITIVE Sensitive      TRIMETH/SULFA <=10 SENSITIVE Sensitive     CLINDAMYCIN <=0.25 SENSITIVE Sensitive     RIFAMPIN <=0.5 SENSITIVE Sensitive     Inducible Clindamycin NEGATIVE Sensitive     * 10,000 COLONIES/mL STAPHYLOCOCCUS SIMULANS  Body fluid culture w Gram Stain     Status: None (Preliminary result)   Collection Time: 10/07/20 12:40 PM   Specimen: Peritoneal Washings  Result Value Ref Range Status   Specimen Description   Final    PERITONEAL Performed at [place] [address] [place], [place] [address]    Special Requests   Final    NONE Performed at [facility], [place] [address] [place], [place] [address]    Gram Stain   Final    RARE WBC PRESENT,BOTH PMN AND MONONUCLEAR NO ORGANISMS SEEN    Culture   Final    NO GROWTH 3 DAYS Performed at [facility], [place] [address], [place], [place] [address]    Report Status PENDING  Incomplete          Radiology Studies: US PELVIS TRANSVAGINAL NON-OB (TV ONLY)  Result Date: 10/10/2020 CLINICAL DATA:  History of primary peritoneal carcinoma. EXAM: ULTRASOUND PELVIS TRANSVAGINAL TECHNIQUE: Transvaginal ultrasound examination of the pelvis was performed including evaluation of the uterus, ovaries, adnexal regions, and pelvic cul-de-sac. COMPARISON:  10/05/2020 abdominopelvic CT. FINDINGS: Uterus Measurements: 8.6 x 4.6 x 5.1 cm = volume: 105 mL. Uterine fundal heterogeneous hypoechoic mass is favored to represent a fibroid. 4.1 x 3.9 x 3.8 cm Endometrium Not confidently visualized secondary to the presumed fibroid. Right ovary Measurements: 5.0 x 4.5 x 3.1 cm = volume: 36 mL. Minimally septated cystic lesion within measures 4.6 x 2.9 x 2.7 cm. No internal vascularity. Left ovary Not visualized Other findings:  Moderate volume fluid, as on CT. IMPRESSION: 1. Uterine fundal mass, likely a fibroid. 2. Right ovarian minimally complex cystic lesion is indeterminate. Given the appearance of the abdomen and pelvis  on CT, consider nonemergent (optimally outpatient) pre and post-contrast pelvic MRI. 3. Lack of visualization of the left ovary. Electronically Signed   By: Abigail Miyamoto M.D.   On: 10/10/2020 07:25   DG Abd 2 Views  Result Date: 10/10/2020 CLINICAL DATA:  Vomiting and generalized abdominal pain. EXAM: ABDOMEN - 2 VIEW COMPARISON:  10/09/2020 FINDINGS: There is a mildly prominent loop of small bowel in the LEFT mid abdomen, similar in appearance to prior study. Findings are consistent with focal ileus or early obstruction. Gas and stool are seen throughout the colon. Degenerative changes are noted in the spine. IMPRESSION: Persistent mild dilatation of small bowel loop in the LEFT mid abdomen. Electronically Signed   By: Nolon Nations M.D.   On: 10/10/2020 12:10   DG Abd Portable 1V  Result Date: 10/09/2020 CLINICAL DATA:  Intermittent abdominal pain with distention. EXAM: PORTABLE ABDOMEN - 1 VIEW COMPARISON:  None. FINDINGS: Single mildly dilated small bowel loop in the left abdomen. Air and stool in the rectum. No radio-opaque calculi or other significant radiographic abnormality are seen. No acute  osseous abnormality. IMPRESSION: 1. Single mildly dilated small bowel loop in the left abdomen, favor focal ileus. Electronically Signed   By: Titus Dubin M.D.   On: 10/09/2020 11:30        Scheduled Meds:  (feeding supplement) PROSource Plus  30 mL Oral TID BM   feeding supplement  1 Container Oral Q24H   heparin  5,000 Units Subcutaneous Q12H   multivitamin with minerals  1 tablet Oral Daily   pantoprazole (PROTONIX) IV  40 mg Intravenous Q12H   peg 3350 powder  0.5 kit Oral Once   And   peg 3350 powder  0.5 kit Oral Once   simvastatin  20 mg Oral QHS   Continuous Infusions:  sodium chloride 125 mL/hr at 10/10/20 0225     LOS: 5 days    Time spent: 35 minutes    Irine Seal, MD [facility]   To contact the attending provider between 7A-7P or the covering  provider during after hours 7P-7A, please log into the web site [email or web address] and access using universal [place] password for that web site. If you do not have the password, please call the hospital operator.  10/10/2020, 1:18 PM

## 2020-10-10 NOTE — Progress Notes (Signed)
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  Ovarian vs peritoneal Ca.  Doubt colon Ca. -Neg CT chest -Neg CT AP for any masses.  However, was done without contrast.  I could not visualize ovaries -Neg EGD. -Have tried to prep her for colon over last 3 days.  She could not even tolerate NG tube.  Refuses any further attempts at colonoscopy.  Certainly, can be considered as an outpatient if oncology feels strongly.   Asymptomatic COVID-19 + in isolation  Plan: -As per oncology. -Await special stains -Advance diet -LVP as needed -Will sign off for now. -Please call if with any concerns     SUBJECTIVE   She refuses any further attempts at NG tube for colonoscopy preparation.  Refusing colonoscopy Denies having any further nausea or vomiting Would like to try regular diet Denies having any abdominal pain.  Has mild distention   OBJECTIVE:     Vital signs in last 24 hours: Temp:  [97.7 F (36.5 C)-97.9 F (36.6 C)] 97.9 F (36.6 C) (08/20 1210) Pulse Rate:  [103-118] 118 (08/20 1210) Resp:  [18-24] 18 (08/20 1210) BP: (98-117)/(70-80) 117/80 (08/20 1210) SpO2:  [96 %-100 %] 100 % (08/20 1210) Last BM Date: 10/05/20 General:   Alert, well-developed female in NAD EENT:  Normal hearing, non icteric sclera, conjunctive pink.  Heart:  Regular rate and rhythm; no murmur.  No lower extremity edema   Pulm: Normal respiratory effort, lungs CTA bilaterally without wheezes or crackles. Abdomen:  Soft, moderately distended.  Nontender.  bowel sounds sluggish but present       Neurologic:  Alert and  oriented x4;  grossly normal neurologically. Psych:  Pleasant, cooperative.  Normal mood and affect.   Intake/Output from previous day: 08/19 0701 - 08/20 0700 In: 1212.5 [I.V.:1212.5] Out: 1 [Emesis/NG output:1] Intake/Output this shift: No intake/output data recorded.  Lab Results: Recent Labs     10/08/20 0506 10/09/20 0401 10/10/20 0416  WBC 5.1 12.4* 9.4  HGB 10.9* 11.3* 10.8*  HCT 34.4* 36.6 34.6*  PLT 237 286 258   BMET Recent Labs    10/08/20 0506 10/09/20 0401 10/09/20 0915 10/10/20 0416  NA 136 141  --  137  K 4.6 5.3* 5.5* 5.0  CL 107 108  --  106  CO2 20* 17*  --  20*  GLUCOSE 81 112*  --  89  BUN 49* 52*  --  52*  CREATININE 1.23* 1.85*  --  1.73*  CALCIUM 9.5 9.8  --  9.5   LFT Recent Labs    10/08/20 0506 10/10/20 0416  PROT 6.7  --   ALBUMIN 3.9 3.2*  AST 14*  --   ALT 9  --   ALKPHOS 73  --   BILITOT 1.0  --    PT/INR No results for input(s): LABPROT, INR in the last 72 hours. Hepatitis Panel No results for input(s): HEPBSAG, HCVAB, HEPAIGM, HEPBIGM in the last 72 hours.  US PELVIS TRANSVAGINAL NON-OB (TV ONLY)  Result Date: 10/10/2020 CLINICAL DATA:  History of primary peritoneal carcinoma. EXAM: ULTRASOUND PELVIS TRANSVAGINAL TECHNIQUE: Transvaginal ultrasound examination of the pelvis was performed including evaluation of the uterus, ovaries, adnexal regions, and pelvic cul-de-sac. COMPARISON:  10/05/2020 abdominopelvic CT. FINDINGS: Uterus Measurements: 8.6 x 4.6 x 5.1 cm = volume: 105 mL. Uterine fundal heterogeneous hypoechoic mass is favored to represent a fibroid. 4.1 x 3.9 x 3.8 cm Endometrium  Not confidently visualized secondary to the presumed fibroid. Right ovary Measurements: 5.0 x 4.5 x 3.1 cm = volume: 36 mL. Minimally septated cystic lesion within measures 4.6 x 2.9 x 2.7 cm. No internal vascularity. Left ovary Not visualized Other findings:  Moderate volume fluid, as on CT. IMPRESSION: 1. Uterine fundal mass, likely a fibroid. 2. Right ovarian minimally complex cystic lesion is indeterminate. Given the appearance of the abdomen and pelvis on CT, consider nonemergent (optimally outpatient) pre and post-contrast pelvic MRI. 3. Lack of visualization of the left ovary. Electronically Signed   By: Abigail Miyamoto M.D.   On: 10/10/2020 07:25    DG Abd 2 Views  Result Date: 10/10/2020 CLINICAL DATA:  Vomiting and generalized abdominal pain. EXAM: ABDOMEN - 2 VIEW COMPARISON:  10/09/2020 FINDINGS: There is a mildly prominent loop of small bowel in the LEFT mid abdomen, similar in appearance to prior study. Findings are consistent with focal ileus or early obstruction. Gas and stool are seen throughout the colon. Degenerative changes are noted in the spine. IMPRESSION: Persistent mild dilatation of small bowel loop in the LEFT mid abdomen. Electronically Signed   By: Nolon Nations M.D.   On: 10/10/2020 12:10   DG Abd Portable 1V  Result Date: 10/09/2020 CLINICAL DATA:  Intermittent abdominal pain with distention. EXAM: PORTABLE ABDOMEN - 1 VIEW COMPARISON:  None. FINDINGS: Single mildly dilated small bowel loop in the left abdomen. Air and stool in the rectum. No radio-opaque calculi or other significant radiographic abnormality are seen. No acute osseous abnormality. IMPRESSION: 1. Single mildly dilated small bowel loop in the left abdomen, favor focal ileus. Electronically Signed   By: Titus Dubin M.D.   On: 10/09/2020 11:30     Principal Problem:   Ascites Active Problems:   Essential hypertension   Hyperlipidemia associated with type 2 diabetes mellitus ([place])   Type 2 diabetes mellitus with hyperglycemia ([facility])   Cirrhosis of liver (HCC)   Cirrhosis (HCC)   Iron deficiency anemia   AKI (acute kidney injury) ([place])   Hyperkalemia   Hyponatremia   Gastritis     LOS: 5 days     Carmell Austria, MD 10/10/2020, 12:14 PM [facility] [phone]

## 2020-10-11 DIAGNOSIS — K7460 Unspecified cirrhosis of liver: Secondary | ICD-10-CM | POA: Diagnosis not present

## 2020-10-11 DIAGNOSIS — N179 Acute kidney failure, unspecified: Secondary | ICD-10-CM | POA: Diagnosis not present

## 2020-10-11 DIAGNOSIS — I10 Essential (primary) hypertension: Secondary | ICD-10-CM | POA: Diagnosis not present

## 2020-10-11 DIAGNOSIS — R188 Other ascites: Secondary | ICD-10-CM | POA: Diagnosis not present

## 2020-10-11 LAB — CBC
HCT: 40 % (ref 36.0–46.0)
Hemoglobin: 11.9 g/dL — ABNORMAL LOW (ref 12.0–15.0)
MCH: 27 pg (ref 26.0–34.0)
MCHC: 29.8 g/dL — ABNORMAL LOW (ref 30.0–36.0)
MCV: 90.7 fL (ref 80.0–100.0)
Platelets: 176 10*3/uL (ref 150–400)
RBC: 4.41 MIL/uL (ref 3.87–5.11)
RDW: 16.8 % — ABNORMAL HIGH (ref 11.5–15.5)
WBC: 8.6 10*3/uL (ref 4.0–10.5)
nRBC: 0 % (ref 0.0–0.2)

## 2020-10-11 LAB — BODY FLUID CULTURE W GRAM STAIN: Culture: NO GROWTH

## 2020-10-11 LAB — GLUCOSE, CAPILLARY
Glucose-Capillary: 87 mg/dL (ref 70–99)
Glucose-Capillary: 95 mg/dL (ref 70–99)
Glucose-Capillary: 93 mg/dL (ref 70–99)

## 2020-10-11 LAB — RENAL FUNCTION PANEL
Albumin: 3.2 g/dL — ABNORMAL LOW (ref 3.5–5.0)
Anion gap: 11 (ref 5–15)
BUN: 39 mg/dL — ABNORMAL HIGH (ref 8–23)
CO2: 16 mmol/L — ABNORMAL LOW (ref 22–32)
Calcium: 9.5 mg/dL (ref 8.9–10.3)
Chloride: 112 mmol/L — ABNORMAL HIGH (ref 98–111)
Creatinine, Ser: 1.23 mg/dL — ABNORMAL HIGH (ref 0.44–1.00)
GFR, Estimated: 49 mL/min — ABNORMAL LOW (ref >60)
Glucose, Bld: 94 mg/dL (ref 70–99)
Phosphorus: 2.9 mg/dL (ref 2.5–4.6)
Potassium: 5 mmol/L (ref 3.5–5.1)
Sodium: 139 mmol/L (ref 135–145)

## 2020-10-11 LAB — MAGNESIUM: Magnesium: 1.9 mg/dL (ref 1.7–2.4)

## 2020-10-11 MED ORDER — SENNOSIDES-DOCUSATE SODIUM 8.6-50 MG PO TABS
1.0000 | ORAL_TABLET | Freq: Two times a day (BID) | ORAL | Status: DC
  Administered 2020-10-11 – 2020-10-21 (×16): 1 via ORAL
  Filled 2020-10-11 (×19): qty 1

## 2020-10-11 MED ORDER — BISACODYL 10 MG RE SUPP
10.0000 mg | Freq: Once | RECTAL | Status: AC
  Administered 2020-10-11: 10 mg via RECTAL
  Filled 2020-10-11: qty 1

## 2020-10-11 MED ORDER — SODIUM BICARBONATE 650 MG PO TABS
650.0000 mg | ORAL_TABLET | Freq: Two times a day (BID) | ORAL | Status: DC
  Administered 2020-10-11 – 2020-10-12 (×3): 650 mg via ORAL
  Filled 2020-10-11 (×4): qty 1

## 2020-10-11 MED ORDER — METOPROLOL TARTRATE 5 MG/5ML IV SOLN: 5.0000 mg | INTRAVENOUS | Status: DC | PRN

## 2020-10-11 NOTE — Progress Notes (Signed)
PROGRESS NOTE    Beth Sanchez  ZLD:[id number] DOB: Apr 06, 1955 DOA: 10/05/2020 PCP: Pcp, No    Chief Complaint  Patient presents with   Abdominal Pain    Vomiting, diarrhea    Brief Narrative:  65 year old F with PMH of HTN, IDDM-2 and obesity presenting with increasing abdominal pain, distention, lower back pain, emesis and fatigue, and found to have liver cirrhosis with ascites, AKI, hypotension, mild hyponatremia, and COVID-19 infection.  She had IR perform paracentesis with removal of 3.5 L.  Had leukocytosis to 15.2.  Started on IV ceftriaxone until SBP is excluded.  Cr 3.34.  She had no respiratory symptoms in regards to a COVID-19 infection.  GI consulted.   Patient never had colonoscopy.  No history of GI bleed.  Quit drinking about 30 years ago.     Assessment & Plan:   Principal Problem:   Ascites Active Problems:   Essential hypertension   Hyperlipidemia associated with type 2 diabetes mellitus (HCC)   Type 2 diabetes mellitus with hyperglycemia (HCC)   Cirrhosis of liver (HCC)   Cirrhosis (HCC)   Iron deficiency anemia   AKI (acute kidney injury) (HCC)   Hyperkalemia   Hyponatremia   Gastritis  1 large malignant ascites -Patient presenting with abdominal distention, CT abdomen and pelvis as well as abdominal ultrasound consistent with large volume ascites. -Status post paracentesis with 3.5 L of fluid removed. -Patient with aSAAG < 1.1 arguing against portal hypertension. -Body fluid cultures with no growth to date with low probability for SBP -Leukocytosis trending down. -No significant exquisite tenderness to palpation. -Peritoneal fluid LDH elevated. -Patient seems to have had some reaccumulation of abdominal ascites and subsequently underwent repeat ultrasound-guided paracentesis 10/07/2020 with 3.3 L of fluid removed. -Cytology consistent with malignant cells. -CT chest with tiny pulmonary nodules and no significant mass or malignancy noted.   -CA 125  elevated at 7281.   -CA 19-9 at 7, CEA at 2.2, AFP at 1.8  -Status post IV albumin.   -Patient was on IV Rocephin which has subsequently been discontinued as SBP highly unlikely -Patient status post upper endoscopy which showed small hiatal hernia, reflux and gastritis.  -Due to elevated CA 125 levels, concern for malignant ascites likely from ovarian source oncology consulted. -Colonoscopy attempted however due to poor bowel prep was canceled.  -Per GI oncology would like to get colonoscopy if possible and as such NG tube ordered by GI to prep and perform colonoscopy, however patient refused NG tube placement and unable to tolerate prep. -Colonoscopy subsequently canceled during this hospitalization per GI -Oncology recommended transvaginal ultrasound which has been ordered and showing a uterine fundal mass likely a fibroid, right ovarian minimally complex cystic lesion indeterminate, lack of visualization of left ovary. -Paracentesis as needed. -GI following and appreciate input and recommendations..  2.  Acute kidney injury -Likely secondary to prerenal azotemia from hypotension in the setting of Zestoretic, Jardiance. -FeNA of 0.9% suggesting a prerenal etiology. -Urinalysis with trace leukocytes, nitrite negative, negative protein arguing against nephrotic syndrome. -Zestoretic on hold. -Urine output not properly recorded. -Renal function was fluctuating and trending back down with hydration with creatinine currently at 1.23 from 1.73 from 1.85 from 1.23. -Patient unable to tolerate prep and refused NG-tube placement. -Saline lock IV fluids, avoid nephrotoxic agents.   -Follow.  3.  Hypotension -In the setting of cirrhosis with ascites and dehydration from GI loss. -BP improved however somewhat soft.  -Continue to hold antihypertensive medications.  -Patient pancultured with blood cultures  pending with no growth to date. -Peritoneal fluid cultures negative to date. -Urine cultures  with 10,000 colonies of Staphylococcus stimulants likely of no significance. -Blood cultures with no growth to date. -Status post IV albumin. -BP borderline with some improvement with hydration.  -Saline lock IV fluids.  -Follow.    4.  Hyperkalemia -Received a dose of Lokelma early on in the hospitalization with improvement with hyperkalemia initially and then subsequently developing hyperkalemia again with a potassium of 5.5 and received another dose of Lokelma.  Repeat potassium at 5.  Repeat labs in the morning.    5.  Hyponatremia -Likely secondary to hypervolemia due to concerns for cirrhosis and abdominal ascites in the setting of Zestoretic. -Zestoretic on hold. -Sodium levels at 139.  6.  Non-anion gap metabolic acidosis likely from AKI -Improved with hydration  7.  Incidental COVID infection -Patient noted to have tested positive on 10/05/2020. -Patient noted to be vaccinated. -Patient with oxygen sats of 98% on room air.   -No indication for treatment.   -Continue isolation precautions for total of 10 days.   8.  Non-insulin-dependent diabetes mellitus type 2 -Noted to be on Jardiance and metformin prior to admission which are currently on hold in the setting of AKI. -Hemoglobin A1c 5.9 (10/07/2020) -CBG 94 on blood work this morning -Oral hypoglycemic agents of metformin and Jardiance on hold.   -SSI discontinued.    9.  Iron deficiency anemia -Anemia panel with iron level of 19, ferritin of 511. -Hemoglobin currently at 11.9 from 11.3 from 10.9 from 9.7 from 11.6 on admission. -Patient status post EGD 10/08/2020 which showed small hiatal hernia, reflux and gastritis.   -Colonoscopy canceled due to poor bowel prep.   -Status post IV iron 10/08/2020.   -Transfusion threshold hemoglobin < 7.  -Patient unable to tolerate bowel prep and as such colonoscopy canceled. -Per GI.   10.  Elevated D-dimer -Felt likely secondary to COVID-19 infection. -Continue DVT  prophylaxis.    11.  Elevated CA 125 -Patient with elevated levels of CA125 of 7281. -Cytology from paracentesis within malignant cells.   -Concern for ovarian malignancy. 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 -Oncology feels may benefit from a transvaginal ultrasound for further evaluation of ovaries which has been ordered and showing a uterine fundal mass likely a fibroid, right ovarian minimally complex cystic lesion indeterminate, lack of visualization of left ovary.. -Per oncology patient likely a candidate for systemic chemotherapy. -Port-A-Cath ordered per oncology. -Per oncology.  12.?  Ileus -Patient noted to have emesis the evening of 10/09/2020.   -Patient with abdominal distention.   -Abdominal films obtained concerning for ileus versus obstruction.  -Patient noted to have had bowel movement yesterday with suppository, and feels urgency to have another bowel movement after suppository this morning.   -Patient denies any further emesis and tolerating full liquid diet.  -Patient would like diet advanced.   -Advance to soft diet and if tolerates could advance to a regular diet.   -Supportive care.    DVT prophylaxis: Heparin Code Status: Full Family Communication: Updated patient.  No family at bedside. Disposition:   Status is: Inpatient  Remains inpatient appropriate because:Inpatient level of care appropriate due to severity of illness  Dispo: The patient is from: Home              Anticipated d/c is to: Home              Patient  currently is not medically stable to d/c.   Difficult to place patient No       Consultants:  Gastroenterology: Dr. Lyndel Safe 10/06/2020 Oncology: Dr. Marin Olp 10/09/2020  Procedures:  Renal ultrasound 10/05/2020 Right upper quadrant abdominal ultrasound 10/05/2020 Chest x-ray 10/05/2020 CT abdomen and pelvis 10/05/2020 2D echo 10/06/2020 Ultrasound-guided paracentesis -3.3 L of  hazy dark amber fluid removed per IR, Darrell Allred, PA 10/07/2020 Ultrasound-guided paracentesis 10/05/2020 per Darrell Allred, PA IR with 3.5 L of hazy fluid removed. Upper endoscopy 10/08/2020 CT chest 10/07/2020   Antimicrobials: IV Rocephin 10/05/2020>>>>>> 10/08/2020 IV Flagyl 10/05/2020 x 1 dose   Subjective: Patient laying in bed.  Denies any further emesis.  Tolerated full liquid diet.  Noted to have had a bowel movement yesterday per RN.  States she feels the urge to have a bowel movement however has not had 1.  Still with some abdominal discomfort and distention.  Wanting diet to be advanced to solid food.    Objective: Vitals:   10/10/20 2250 10/11/20 0100 10/11/20 0511 10/11/20 1244  BP: 100/76 106/78 113/83 99/74  Pulse: (!) 115 (!) 102 (!) 101 (!) 107  Resp: '20 20 18 18  ' Temp: 97.7 F (36.5 C) 97.7 F (36.5 C) 97.8 F (36.6 C)   TempSrc: Oral Oral Axillary   SpO2: 100% 100% 98% 93%  Weight:      Height:        Intake/Output Summary (Last 24 hours) at 10/11/2020 1316 Last data filed at 10/11/2020 0200 Gross per 24 hour  Intake 465.76 ml  Output --  Net 465.76 ml    Filed Weights   10/06/20 0000 10/08/20 1351  Weight: 87.7 kg 87.7 kg    Examination:  General exam: NAD Respiratory system: CTA B.  No wheezes, no crackles, no rhonchi.  Normal respiratory effort.  Cardiovascular system: RRR no murmurs rubs or gallops.  No JVD.  1+ bilateral lower extremity edema.  Gastrointestinal system: Distended, soft, decreased tenderness to palpation, decreased bowel sounds.  No rebound.  No guarding.   Central nervous system: Alert and oriented.  Moving extremities spontaneously.  No focal neurological deficits.  Extremities: 1+ bilateral lower extremity edema Skin: No rashes, lesions or ulcers Psychiatry: Judgement and insight appear normal. Mood & affect appropriate.     Data Reviewed: I have personally reviewed following labs and imaging studies  CBC: Recent Labs   Lab 10/05/20 1136 10/06/20 0339 10/07/20 0407 10/08/20 0506 10/09/20 0401 10/10/20 0416 10/11/20 0842  WBC 6.6   < > 5.0 5.1 12.4* 9.4 8.6  NEUTROABS 5.9  --   --  4.3  --  8.6*  --   HGB 11.6*   < > 9.7* 10.9* 11.3* 10.8* 11.9*  HCT 36.4   < > 30.6* 34.4* 36.6 34.6* 40.0  MCV 82.9   < > 83.6 84.3 84.5 85.6 90.7  PLT 375   < > 275 237 286 258 176   < > = values in this interval not displayed.     Basic Metabolic Panel: Recent Labs  Lab 10/06/20 0339 10/07/20 0407 10/08/20 0506 10/09/20 0401 10/09/20 0915 10/10/20 0416 10/11/20 0842  NA 133* 136  136 136 141  --  137 139  K 5.4* 4.6  4.6 4.6 5.3* 5.5* 5.0 5.0  CL 103 105  106 107 108  --  106 112*  CO2 18* 21*  20* 20* 17*  --  20* 16*  GLUCOSE 71 92  94 81 112*  --  89  94  BUN 77* 65*  65* 49* 52*  --  52* 39*  CREATININE 2.28* 1.55*  1.61* 1.23* 1.85*  --  1.73* 1.23*  CALCIUM 9.0 9.3  9.4 9.5 9.8  --  9.5 9.5  MG 2.1 2.1 1.9 2.0  --  2.0 1.9  PHOS 4.6 3.1  --   --   --  4.0 2.9     GFR: Estimated Creatinine Clearance: 51.8 mL/min (A) (by C-G formula based on SCr of 1.23 mg/dL (H)).  Liver Function Tests: Recent Labs  Lab 10/05/20 1136 10/06/20 0339 10/07/20 0407 10/08/20 0506 10/10/20 0416 10/11/20 0842  AST 16 13* 13* 14*  --   --   ALT '12 10 9 9  ' --   --   ALKPHOS 122 93 75 73  --   --   BILITOT 0.7 0.8 0.8 1.0  --   --   PROT 7.4 6.5 6.6 6.7  --   --   ALBUMIN 3.0* 3.2* 3.8  3.8 3.9 3.2* 3.2*     CBG: Recent Labs  Lab 10/09/20 2053 10/10/20 0523 10/10/20 1620 10/10/20 2311 10/11/20 1237  GLUCAP 83 82 87 91 87      Recent Results (from the past 240 hour(s))  Blood Culture (routine x 2)     Status: None   Collection Time: 10/05/20 11:36 AM   Specimen: BLOOD  Result Value Ref Range Status   Specimen Description   Final    BLOOD LEFT ANTECUBITAL Performed at [facility], [place] [address] [place], [place] [address]    Special Requests   Final    BOTTLES  DRAWN AEROBIC AND ANAEROBIC Blood Culture results may not be optimal due to an inadequate volume of blood received in culture bottles Performed at [facility] [address] [place], [place] [address]    Culture   Final    NO GROWTH 5 DAYS Performed at [facility], [place] [address] [place], [place] [address]    Report Status 10/10/2020 FINAL  Final  Resp Panel by RT-PCR (Flu A&B, Covid) Nasopharyngeal Swab     Status: Abnormal   Collection Time: 10/05/20 12:00 PM   Specimen: Nasopharyngeal Swab; Nasopharyngeal(NP) swabs in vial transport medium  Result Value Ref Range Status   SARS Coronavirus 2 by RT PCR POSITIVE (A) NEGATIVE Final    Comment: RESULT CALLED TO, READ BACK BY AND VERIFIED WITH: Halina Maidens, RN 10/05/20 1446 KDS (NOTE) SARS-CoV-2 target nucleic acids are DETECTED.  The SARS-CoV-2 RNA is generally detectable in upper respiratory specimens during the acute phase of infection. Positive results are indicative of the presence of the identified virus, but do not rule out bacterial infection or co-infection with other pathogens not detected by the test. Clinical correlation with patient history and other diagnostic information is necessary to determine patient infection status. The expected result is Negative.  Fact Sheet for Patients: EntrepreneurPulse.com.au  Fact Sheet for Healthcare Providers: IncredibleEmployment.be  This test is not yet approved or cleared by the [place] FDA and  has been authorized for detection and/or diagnosis of SARS-CoV-2 by FDA under an Emergency Use Authorization (EUA).  This EUA will remain in effect (meaning this test can be u sed) for the duration of  the COVID-19 declaration under Section 564(b)(1) of the Act, 21 U.S.C. section 360bbb-3(b)(1), unless the authorization is terminated or revoked sooner.     Influenza A by PCR NEGATIVE NEGATIVE Final   Influenza B by  PCR  NEGATIVE NEGATIVE Final    Comment: (NOTE) The Xpert Xpress SARS-CoV-2/FLU/RSV plus assay is intended as an aid in the diagnosis of influenza from Nasopharyngeal swab specimens and should not be used as a sole basis for treatment. Nasal washings and aspirates are unacceptable for Xpert Xpress SARS-CoV-2/FLU/RSV testing.  Fact Sheet for Patients: EntrepreneurPulse.com.au  Fact Sheet for Healthcare Providers: IncredibleEmployment.be  This test is not yet approved or cleared by the [place] FDA and has been authorized for detection and/or diagnosis of SARS-CoV-2 by FDA under an Emergency Use Authorization (EUA). This EUA will remain in effect (meaning this test can be used) for the duration of the COVID-19 declaration under Section 564(b)(1) of the Act, 21 U.S.C. section 360bbb-3(b)(1), unless the authorization is terminated or revoked.  Performed at [facility], [place] [address] [place], [place] [address]   Blood Culture (routine x 2)     Status: None   Collection Time: 10/05/20 12:00 PM   Specimen: BLOOD  Result Value Ref Range Status   Specimen Description   Final    BLOOD RIGHT ANTECUBITAL Performed at [facility] [address] [place], [place] [address]    Special Requests   Final    BOTTLES DRAWN AEROBIC AND ANAEROBIC Blood Culture results may not be optimal due to an inadequate volume of blood received in culture bottles Performed at [facility] [address], [place], [place] [address]    Culture   Final    NO GROWTH 5 DAYS Performed at [facility], [place] [address], [place], [place] [address]    Report Status 10/10/2020 FINAL  Final  Body fluid culture w Gram Stain     Status: None   Collection Time: 10/05/20  3:07 PM   Specimen: Peritoneal Cavity; Peritoneal Fluid  Result Value Ref Range Status   Specimen Description   Final    PERITONEAL CAVITY Performed  at [facility] [address], [place], [place] [address]    Special Requests   Final    NONE Performed at [facility], [place] [address] [place], [place] [address]    Gram Stain   Final    NO SQUAMOUS EPITHELIAL CELLS SEEN FEW WBC SEEN NO ORGANISMS SEEN    Culture   Final    NO GROWTH Performed at [facility], [place] [address], [place], [place] [address]    Report Status 10/08/2020 FINAL  Final  Culture, body fluid w Gram Stain-bottle     Status: None   Collection Time: 10/05/20  5:02 PM   Specimen: Fluid  Result Value Ref Range Status   Specimen Description FLUID RIGHT PERITONEAL  Final   Special Requests   Final    BOTTLES DRAWN AEROBIC AND ANAEROBIC Blood Culture adequate volume   Culture   Final    NO GROWTH 5 DAYS Performed at [facility], [place] [address] [place], [place] [address]    Report Status 10/10/2020 FINAL  Final  Gram stain     Status: None   Collection Time: 10/05/20  5:02 PM   Specimen: Fluid  Result Value Ref Range Status   Specimen Description FLUID RIGHT PERITONEAL  Final   Special Requests NONE  Final   Gram Stain   Final    RARE SQUAMOUS EPITHELIAL CELLS PRESENT RARE WBC SEEN NO ORGANISMS SEEN Performed at [facility], [place] [address] [place], [place] [address]  Report Status 10/06/2020 FINAL  Final  Urine Culture     Status: Abnormal   Collection Time: 10/05/20  9:19 PM   Specimen: In/Out Cath Urine  Result Value Ref Range Status   Specimen Description   Final    IN/OUT CATH URINE Performed at [facility] [address], [place], [place] [address]    Special Requests   Final    NONE Performed at [facility], [place] [address] [place], [place] [address]    Culture 10,000 COLONIES/mL STAPHYLOCOCCUS SIMULANS (A)  Final   Report Status 10/08/2020 FINAL  Final   Organism ID, Bacteria STAPHYLOCOCCUS SIMULANS (A)  Final      Susceptibility    Staphylococcus simulans - MIC*    CIPROFLOXACIN <=0.5 SENSITIVE Sensitive     GENTAMICIN <=0.5 SENSITIVE Sensitive     NITROFURANTOIN <=16 SENSITIVE Sensitive     OXACILLIN <=0.25 SENSITIVE Sensitive     TETRACYCLINE <=1 SENSITIVE Sensitive     VANCOMYCIN <=0.5 SENSITIVE Sensitive     TRIMETH/SULFA <=10 SENSITIVE Sensitive     CLINDAMYCIN <=0.25 SENSITIVE Sensitive     RIFAMPIN <=0.5 SENSITIVE Sensitive     Inducible Clindamycin NEGATIVE Sensitive     * 10,000 COLONIES/mL STAPHYLOCOCCUS SIMULANS  Body fluid culture w Gram Stain     Status: None   Collection Time: 10/07/20 12:40 PM   Specimen: Peritoneal Washings  Result Value Ref Range Status   Specimen Description   Final    PERITONEAL Performed at [facility] [address], [place], [place] [address]    Special Requests   Final    NONE Performed at [facility], [place] [address] [place], [place] [address]    Gram Stain   Final    RARE WBC PRESENT,BOTH PMN AND MONONUCLEAR NO ORGANISMS SEEN    Culture   Final    NO GROWTH Performed at [facility], [place] [address] [place], [place] [address]    Report Status 10/11/2020 FINAL  Final          Radiology Studies: US PELVIS TRANSVAGINAL NON-OB (TV ONLY)  Result Date: 10/10/2020 CLINICAL DATA:  History of primary peritoneal carcinoma. EXAM: ULTRASOUND PELVIS TRANSVAGINAL TECHNIQUE: Transvaginal ultrasound examination of the pelvis was performed including evaluation of the uterus, ovaries, adnexal regions, and pelvic cul-de-sac. COMPARISON:  10/05/2020 abdominopelvic CT. FINDINGS: Uterus Measurements: 8.6 x 4.6 x 5.1 cm = volume: 105 mL. Uterine fundal heterogeneous hypoechoic mass is favored to represent a fibroid. 4.1 x 3.9 x 3.8 cm Endometrium Not confidently visualized secondary to the presumed fibroid. Right ovary Measurements: 5.0 x 4.5 x 3.1 cm = volume: 36 mL. Minimally septated cystic lesion within measures 4.6 x 2.9 x 2.7 cm.  No internal vascularity. Left ovary Not visualized Other findings:  Moderate volume fluid, as on CT. IMPRESSION: 1. Uterine fundal mass, likely a fibroid. 2. Right ovarian minimally complex cystic lesion is indeterminate. Given the appearance of the abdomen and pelvis on CT, consider nonemergent (optimally outpatient) pre and post-contrast pelvic MRI. 3. Lack of visualization of the left ovary. Electronically Signed   By: Abigail Miyamoto M.D.   On: 10/10/2020 07:25   DG Abd 2 Views  Result Date: 10/10/2020 CLINICAL DATA:  Vomiting and generalized abdominal pain. EXAM: ABDOMEN - 2 VIEW COMPARISON:  10/09/2020 FINDINGS: There is a mildly prominent loop of small bowel in the LEFT mid abdomen, similar in appearance to prior study. Findings are consistent with focal ileus  or early obstruction. Gas and stool are seen throughout the colon. Degenerative changes are noted in the spine. IMPRESSION: Persistent mild dilatation of small bowel loop in the LEFT mid abdomen. Electronically Signed   By: Nolon Nations M.D.   On: 10/10/2020 12:10        Scheduled Meds:  (feeding supplement) PROSource Plus  30 mL Oral TID BM   feeding supplement  1 Container Oral Q24H   heparin  5,000 Units Subcutaneous Q12H   multivitamin with minerals  1 tablet Oral Daily   pantoprazole (PROTONIX) IV  40 mg Intravenous Q12H   peg 3350 powder  0.5 kit Oral Once   And   peg 3350 powder  0.5 kit Oral Once   simvastatin  20 mg Oral QHS   Continuous Infusions:     LOS: 6 days    Time spent: 35 minutes    Irine Seal, MD [facility]   To contact the attending provider between 7A-7P or the covering provider during after hours 7P-7A, please log into the web site [email or web address] and access using universal [place] password for that web site. If you do not have the password, please call the hospital operator.  10/11/2020, 1:16 PM

## 2020-10-12 ENCOUNTER — Inpatient Hospital Stay (HOSPITAL_COMMUNITY): Payer: Medicare Other

## 2020-10-12 ENCOUNTER — Encounter (HOSPITAL_COMMUNITY): Payer: Self-pay | Admitting: Internal Medicine

## 2020-10-12 DIAGNOSIS — Z7189 Other specified counseling: Secondary | ICD-10-CM

## 2020-10-12 DIAGNOSIS — C482 Malignant neoplasm of peritoneum, unspecified: Secondary | ICD-10-CM

## 2020-10-12 DIAGNOSIS — R180 Malignant ascites: Secondary | ICD-10-CM | POA: Diagnosis not present

## 2020-10-12 DIAGNOSIS — I10 Essential (primary) hypertension: Secondary | ICD-10-CM | POA: Diagnosis not present

## 2020-10-12 DIAGNOSIS — E1169 Type 2 diabetes mellitus with other specified complication: Secondary | ICD-10-CM | POA: Diagnosis not present

## 2020-10-12 DIAGNOSIS — N179 Acute kidney failure, unspecified: Secondary | ICD-10-CM | POA: Diagnosis not present

## 2020-10-12 DIAGNOSIS — R188 Other ascites: Secondary | ICD-10-CM | POA: Diagnosis not present

## 2020-10-12 HISTORY — DX: Other specified counseling: Z71.89

## 2020-10-12 HISTORY — DX: Malignant neoplasm of peritoneum, unspecified: C48.2

## 2020-10-12 LAB — CBC WITH DIFFERENTIAL/PLATELET
Abs Immature Granulocytes: 0.07 10*3/uL (ref 0.00–0.07)
Basophils Absolute: 0 10*3/uL (ref 0.0–0.1)
Basophils Relative: 0 %
Eosinophils Absolute: 0 10*3/uL (ref 0.0–0.5)
Eosinophils Relative: 0 %
HCT: 38.4 % (ref 36.0–46.0)
Hemoglobin: 11.7 g/dL — ABNORMAL LOW (ref 12.0–15.0)
Immature Granulocytes: 1 %
Lymphocytes Relative: 5 %
Lymphs Abs: 0.3 10*3/uL — ABNORMAL LOW (ref 0.7–4.0)
MCH: 26.1 pg (ref 26.0–34.0)
MCHC: 30.5 g/dL (ref 30.0–36.0)
MCV: 85.7 fL (ref 80.0–100.0)
Monocytes Absolute: 0.3 10*3/uL (ref 0.1–1.0)
Monocytes Relative: 4 %
Neutro Abs: 6.8 10*3/uL (ref 1.7–7.7)
Neutrophils Relative %: 90 %
Platelets: 286 10*3/uL (ref 150–400)
RBC: 4.48 MIL/uL (ref 3.87–5.11)
RDW: 16.7 % — ABNORMAL HIGH (ref 11.5–15.5)
WBC: 7.5 10*3/uL (ref 4.0–10.5)
nRBC: 0 % (ref 0.0–0.2)

## 2020-10-12 LAB — BASIC METABOLIC PANEL WITH GFR
Anion gap: 10 (ref 5–15)
BUN: 40 mg/dL — ABNORMAL HIGH (ref 8–23)
CO2: 19 mmol/L — ABNORMAL LOW (ref 22–32)
Calcium: 9.7 mg/dL (ref 8.9–10.3)
Chloride: 108 mmol/L (ref 98–111)
Creatinine, Ser: 1.38 mg/dL — ABNORMAL HIGH (ref 0.44–1.00)
GFR, Estimated: 42 mL/min — ABNORMAL LOW
Glucose, Bld: 92 mg/dL (ref 70–99)
Potassium: 4.9 mmol/L (ref 3.5–5.1)
Sodium: 137 mmol/L (ref 135–145)

## 2020-10-12 LAB — GLUCOSE, CAPILLARY
Glucose-Capillary: 75 mg/dL (ref 70–99)
Glucose-Capillary: 74 mg/dL (ref 70–99)
Glucose-Capillary: 87 mg/dL (ref 70–99)
Glucose-Capillary: 78 mg/dL (ref 70–99)
Glucose-Capillary: 88 mg/dL (ref 70–99)
Glucose-Capillary: 86 mg/dL (ref 70–99)
Glucose-Capillary: 81 mg/dL (ref 70–99)

## 2020-10-12 LAB — MAGNESIUM: Magnesium: 2 mg/dL (ref 1.7–2.4)

## 2020-10-12 LAB — CYTOLOGY - NON PAP

## 2020-10-12 MED ORDER — BISACODYL 10 MG RE SUPP
10.0000 mg | Freq: Once | RECTAL | Status: AC
  Administered 2020-10-12: 10 mg via RECTAL
  Filled 2020-10-12: qty 1

## 2020-10-12 MED ORDER — ALBUMIN HUMAN 25 % IV SOLN
25.0000 g | Freq: Four times a day (QID) | INTRAVENOUS | Status: AC
  Administered 2020-10-12 – 2020-10-13 (×4): 25 g via INTRAVENOUS
  Filled 2020-10-12 (×4): qty 100

## 2020-10-12 MED ORDER — LIDOCAINE HCL 1 % IJ SOLN
INTRAMUSCULAR | Status: AC
  Filled 2020-10-12: qty 20

## 2020-10-12 NOTE — Progress Notes (Signed)
It looks like Ms. Felling has more recurrent ascites.  Her abdomen is distended.  She does not eating all that much.  We will go ahead and see about another paracentesis on her.  She cannot get the NG tube in for the prep for the colonoscopy.  I really do not think a colonoscopy is going to be all that helpful for [place].  She has a transvaginal ultrasound on Friday.  This did show a complex cystic lesion in the right ovary.  This could certainly be where the primary is.  We will have to do chemotherapy in her room.  I spoke to the nurse manager up on 6 E.  Because she is COVID positive we do not want to bring her up there and potentially have an increased risk of infection for other patients.  I told her that she is asymptomatic.  However, this is the requirement of the floor.  I totally understand this.  Hopefully she can have a Port-A-Cath placed by radiology.  If not, then she will need to have a PICC line put in.  Her labs look okay.  Her white cell count 7.5.  Hemoglobin 11.7.  Platelet count 286,000.  The BUN is 40 creatinine 1.38.  Calcium 9.7.  All of her vital signs look pretty stable.  Temperature 97.7.  Pulse 97.  Blood pressure 95/71.  Her abdomen is distended.  There is a fluid wave.  There is no tenderness to palpation.  Her lungs sound pretty clear.  Cardiac exam regular rate and rhythm.  Extremities shows no clubbing, cyanosis or edema.  Neurological exam is nonfocal.  I will put the orders in for the chemotherapy later on today.  I will think that the soonest we can probably do this is going to be Wednesday.  Again we have to see about getting a Port-A-Cath placed.  I would like to believe that she should respond well to treatment.  I will use carboplatinum/Taxol/Avastin.  I think this would be a very appropriate regimen for her that she would be able to tolerate and decrease her ascites.  I do appreciate the wonderful and hard work that is being done by all the staff on 4 W.  Lattie Haw, MD  Acts 3:16

## 2020-10-12 NOTE — Progress Notes (Addendum)
PROGRESS NOTE    Delesa Kawa  GEZ:[id number] DOB: [birth year] DOA: 10/05/2020 PCP: Pcp, No    Chief Complaint  Patient presents with   Abdominal Pain    Vomiting, diarrhea    Brief Narrative:  65 year old F with PMH of HTN, IDDM-2 and obesity presenting with increasing abdominal pain, distention, lower back pain, emesis and fatigue, and found to have liver cirrhosis with ascites, AKI, hypotension, mild hyponatremia, and COVID-19 infection.  She had IR perform paracentesis with removal of 3.5 L.  Had leukocytosis to 15.2.  Started on IV ceftriaxone until SBP is excluded.  Cr 3.34.  She had no respiratory symptoms in regards to a COVID-19 infection.  GI consulted.   Patient never had colonoscopy.  No history of GI bleed.  Quit drinking about 30 years ago.     Assessment & Plan:   Principal Problem:   Ascites Active Problems:   Essential hypertension   Hyperlipidemia associated with type 2 diabetes mellitus (HCC)   Type 2 diabetes mellitus with hyperglycemia (HCC)   Cirrhosis of liver (HCC)   Cirrhosis (HCC)   Iron deficiency anemia   AKI (acute kidney injury) ([place])   Hyperkalemia   Hyponatremia   Gastritis   Primary peritoneal carcinomatosis (HCC)   Goals of care, counseling/discussion  1 large malignant ascites concern for peritoneal carcinomatosis likely of GYN etiology. -Patient presenting with abdominal distention, CT abdomen and pelvis as well as abdominal ultrasound consistent with large volume ascites. -Status post paracentesis with 3.5 L of fluid removed. -Patient with aSAAG < 1.1 arguing against portal hypertension. -Body fluid cultures with no growth to date with low probability for SBP -Leukocytosis trending down. -No significant exquisite tenderness to palpation. -Peritoneal fluid LDH elevated. -Patient seems to have had some reaccumulation of abdominal ascites and subsequently underwent repeat ultrasound-guided paracentesis 10/07/2020 with 3.3 L of fluid  removed. -Patient underwent repeat paracentesis today 10/12/2020 with 5.3 L of fluid removed -Cytology consistent with malignant cells. -CT chest with tiny pulmonary nodules and no significant mass or malignancy noted.   -CA 125 elevated at 7281.   -CA 19-9 at 7, CEA at 2.2, AFP at 1.8  -Status post IV albumin.   -Patient was on IV Rocephin which has subsequently been discontinued as SBP highly unlikely -Patient status post upper endoscopy which showed small hiatal hernia, reflux and gastritis.  -Due to elevated CA 125 levels, concern for malignant ascites likely from ovarian source oncology consulted. -Colonoscopy attempted however due to poor bowel prep was canceled.  -Per GI oncology would like to get colonoscopy if possible and as such NG tube ordered by GI to prep and perform colonoscopy, however patient refused NG tube placement and unable to tolerate prep. -Colonoscopy subsequently canceled during this hospitalization per GI -Oncology recommended transvaginal ultrasound which showed a uterine fundal mass likely a fibroid, right ovarian minimally complex cystic lesion indeterminate, lack of visualization of left ovary. -Paracentesis as needed. -Patient for Port-A-Cath placement as early as tomorrow per IR. -Oncology recommending initiation of chemotherapy as early as Wednesday, 10/14/2020. -GI was following but have signed off. -Oncology following and appreciate input and recommendations.  2.  Acute kidney injury -Likely secondary to prerenal azotemia from hypotension in the setting of Zestoretic, Jardiance. -FeNA of 0.9% suggesting a prerenal etiology. -Urinalysis with trace leukocytes, nitrite negative, negative protein arguing against nephrotic syndrome. -Zestoretic on hold. -Urine output not properly recorded. -Renal function was fluctuating and trending back down with hydration with creatinine currently at 1.38 from 1.23 from  1.73 from 1.85 from 1.23. -Patient unable to tolerate  prep and refused NG-tube placement. -Saline lock IV fluids, avoid nephrotoxic agents.   -Follow.  3.  Hypotension -In the setting of cirrhosis with ascites and dehydration from GI loss. -BP improved however somewhat soft.  -Continue to hold antihypertensive medications.  -Patient pancultured with blood cultures pending with no growth to date. -Peritoneal fluid cultures negative to date. -Urine cultures with 10,000 colonies of Staphylococcus stimulants likely of no significance. -Blood cultures with no growth to date. -Status post IV albumin. -BP borderline and soft this morning however patient asymptomatic. -IV albumin every 6 hours x1 day. -Follow.    4.  Hyperkalemia -Received a dose of Lokelma early on in the hospitalization with improvement with hyperkalemia initially and then subsequently developing hyperkalemia again with a potassium of 5.5 and received another dose of Lokelma.   -Potassium of 4.9 this morning.   -Follow.  5.  Hyponatremia -Likely secondary to hypervolemia due to concerns for cirrhosis and abdominal ascites in the setting of Zestoretic. -Zestoretic on hold. -Sodium levels at 137.    6.  Non-anion gap metabolic acidosis likely from AKI -Improved with hydration and oral bicarb supplementation.  7.  Incidental COVID infection -Patient noted to have tested positive on 10/05/2020. -Patient noted to be vaccinated. -Patient with oxygen sats of 99% on room air.   -No indication for treatment.   -Continue isolation precautions for total of 10 days which will be completed on 10/16/2020.   8.  Non-insulin-dependent diabetes mellitus type 2 -Noted to be on Jardiance and metformin prior to admission which are currently on hold in the setting of AKI. -Hemoglobin A1c 5.9 (10/07/2020) -CBG 88 this morning.   -Continue to hold oral hypoglycemic agents.   -SSI discontinued.   -Follow.    9.  Iron deficiency anemia -Anemia panel with iron level of 19, ferritin of  511. -Hemoglobin currently at 11.7 from 11.9 from 11.3 from 10.9 from 9.7 from 11.6 on admission. -Patient status post EGD 10/08/2020 which showed small hiatal hernia, reflux and gastritis.   -Colonoscopy canceled due to poor bowel prep.   -Status post IV iron 10/08/2020.   -Transfusion threshold hemoglobin < 7.  -Patient unable to tolerate bowel prep and as such colonoscopy canceled. -Per GI.   10.  Elevated D-dimer -Felt likely secondary to COVID-19 infection. -Continue DVT prophylaxis.    11.  Elevated CA 125 -Patient with elevated levels of CA125 of 7281. -Cytology from paracentesis within malignant cells.   -Concern for ovarian malignancy. 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 -Oncology recommended transvaginal ultrasound which was done and showed a uterine fundal mass likely a fibroid, right ovarian minimally complex cystic lesion indeterminate, lack of visualization of left ovary.. -Per oncology patient likely a candidate for systemic chemotherapy. -Port-A-Cath ordered per oncology and likely to be placed tomorrow -Oncology recommended initiation of chemotherapy as early as Wednesday, 10/14/2020.  -Per oncology.  12.?  Ileus -Patient noted to have emesis the evening of 10/09/2020.   -Patient with abdominal distention.   -Abdominal films obtained concerning for ileus versus obstruction.  -Patient noted to have had bowel movement yesterday after suppository.   -Diet advanced per patient's insistence however patient with episode of emesis yesterday and this morning and is in agreement with down grading diet to clears for now.   -No flatus today.  No bowel movement today.   -Downgrade diet to clears and monitor.   -  Mobilize.     DVT prophylaxis: Heparin Code Status: Full Family Communication: Updated patient.  No family at bedside. Disposition:   Status is: Inpatient  Remains inpatient appropriate  because:Inpatient level of care appropriate due to severity of illness  Dispo: The patient is from: Home              Anticipated d/c is to: Home              Patient currently is not medically stable to d/c.   Difficult to place patient No       Consultants:  Gastroenterology: Dr. Lyndel Safe 10/06/2020 Oncology: Dr. Marin Olp 10/09/2020  Procedures:  Renal ultrasound 10/05/2020 Right upper quadrant abdominal ultrasound 10/05/2020 Chest x-ray 10/05/2020 CT abdomen and pelvis 10/05/2020 2D echo 10/06/2020 Ultrasound-guided paracentesis -3.3 L of hazy dark amber fluid removed per IR, Darrell Allred, PA 10/07/2020 Ultrasound-guided paracentesis 10/05/2020 per Darrell Allred, PA IR with 3.5 L of hazy fluid removed. Upper endoscopy 10/08/2020 CT chest 10/07/2020 Ultrasound-guided paracentesis--5.3 L of clear yellow fluid removed 10/12/2020 per IR, Ascencion Dike, PA   Antimicrobials: IV Rocephin 10/05/2020>>>>>> 10/08/2020 IV Flagyl 10/05/2020 x 1 dose   Subjective: Laying in bed.  Had episodes of emesis last night and this morning.  Denies any chest pain.  Abdominal pain that has improved on pain medication.  Stated had some shortness of breath which improved after paracentesis this morning.  No cough.   States is post to get Port-A-Cath placed and to start chemotherapy soon.  Somewhat optimistic.  Objective: Vitals:   10/12/20 1100 10/12/20 1215 10/12/20 1221 10/12/20 1232  BP: 90/70 92/70 (!) 83/64 (!) 82/65  Pulse:      Resp:      Temp:      TempSrc:      SpO2:      Weight:      Height:        Intake/Output Summary (Last 24 hours) at 10/12/2020 1324 Last data filed at 10/12/2020 0945 Gross per 24 hour  Intake 600 ml  Output 160 ml  Net 440 ml    Filed Weights   10/06/20 0000 10/08/20 1351  Weight: 87.7 kg 87.7 kg    Examination:  General exam: NAD Respiratory system: Lungs clear to auscultation bilaterally.  No wheezes, no crackles, no rhonchi.  Normal respiratory effort.    Cardiovascular system: Regular rate rhythm no murmurs rubs or gallops.  No JVD.  No lower extremity edema. Gastrointestinal system: Soft, nontender, nondistended, positive bowel sounds.  No rebound.  No guarding. Central nervous system: Alert and oriented.  Moving extremities spontaneously.  No focal neurological deficits.  Extremities: 1+ bilateral lower extremity edema Skin: No rashes, lesions or ulcers Psychiatry: Judgement and insight appear normal. Mood & affect appropriate.     Data Reviewed: I have personally reviewed following labs and imaging studies  CBC: Recent Labs  Lab 10/08/20 0506 10/09/20 0401 10/10/20 0416 10/11/20 0842 10/12/20 0349  WBC 5.1 12.4* 9.4 8.6 7.5  NEUTROABS 4.3  --  8.6*  --  6.8  HGB 10.9* 11.3* 10.8* 11.9* 11.7*  HCT 34.4* 36.6 34.6* 40.0 38.4  MCV 84.3 84.5 85.6 90.7 85.7  PLT 237 286 258 176 286     Basic Metabolic Panel: Recent Labs  Lab 10/06/20 0339 10/07/20 0407 10/08/20 0506 10/09/20 0401 10/09/20 0915 10/10/20 0416 10/11/20 0842 10/12/20 0349  NA 133* 136  136 136 141  --  137 139 137  K 5.4* 4.6  4.6 4.6 5.3* 5.5* 5.0  5.0 4.9  CL 103 105  106 107 108  --  106 112* 108  CO2 18* 21*  20* 20* 17*  --  20* 16* 19*  GLUCOSE 71 92  94 81 112*  --  89 94 92  BUN 77* 65*  65* 49* 52*  --  52* 39* 40*  CREATININE 2.28* 1.55*  1.61* 1.23* 1.85*  --  1.73* 1.23* 1.38*  CALCIUM 9.0 9.3  9.4 9.5 9.8  --  9.5 9.5 9.7  MG 2.1 2.1 1.9 2.0  --  2.0 1.9 2.0  PHOS 4.6 3.1  --   --   --  4.0 2.9  --      GFR: Estimated Creatinine Clearance: 46.2 mL/min (A) (by C-G formula based on SCr of 1.38 mg/dL (H)).  Liver Function Tests: Recent Labs  Lab 10/06/20 0339 10/07/20 0407 10/08/20 0506 10/10/20 0416 10/11/20 0842  AST 13* 13* 14*  --   --   ALT '10 9 9  ' --   --   ALKPHOS 93 75 73  --   --   BILITOT 0.8 0.8 1.0  --   --   PROT 6.5 6.6 6.7  --   --   ALBUMIN 3.2* 3.8  3.8 3.9 3.2* 3.2*     CBG: Recent Labs  Lab  10/11/20 1237 10/11/20 1626 10/11/20 2135 10/12/20 0742 10/12/20 0839  GLUCAP 87 95 93 78 88      Recent Results (from the past 240 hour(s))  Blood Culture (routine x 2)     Status: None   Collection Time: 10/05/20 11:36 AM   Specimen: BLOOD  Result Value Ref Range Status   Specimen Description   Final    BLOOD LEFT ANTECUBITAL Performed at [facility], [place] [address] [place], [place] [address]    Special Requests   Final    BOTTLES DRAWN AEROBIC AND ANAEROBIC Blood Culture results may not be optimal due to an inadequate volume of blood received in culture bottles Performed at [place] [address], [place], [place] [address]    Culture   Final    NO GROWTH 5 DAYS Performed at [facility], [place] [address], [place], [place] [address]    Report Status 10/10/2020 FINAL  Final  Resp Panel by RT-PCR (Flu A&B, Covid) Nasopharyngeal Swab     Status: Abnormal   Collection Time: 10/05/20 12:00 PM   Specimen: Nasopharyngeal Swab; Nasopharyngeal(NP) swabs in vial transport medium  Result Value Ref Range Status   SARS Coronavirus 2 by RT PCR POSITIVE (A) NEGATIVE Final    Comment: RESULT CALLED TO, READ BACK BY AND VERIFIED WITH: Halina Maidens, RN 10/05/20 1446 KDS (NOTE) SARS-CoV-2 target nucleic acids are DETECTED.  The SARS-CoV-2 RNA is generally detectable in upper respiratory specimens during the acute phase of infection. Positive results are indicative of the presence of the identified virus, but do not rule out bacterial infection or co-infection with other pathogens not detected by the test. Clinical correlation with patient history and other diagnostic information is necessary to determine patient infection status. The expected result is Negative.  Fact Sheet for Patients: EntrepreneurPulse.com.au  Fact Sheet for Healthcare Providers: IncredibleEmployment.be  This test is not yet  approved or cleared by the [place] FDA and  has been authorized for detection and/or diagnosis of SARS-CoV-2 by FDA under an Emergency Use Authorization (EUA).  This EUA will remain in effect (meaning this test can be u  sed) for the duration of  the COVID-19 declaration under Section 564(b)(1) of the Act, 21 U.S.C. section 360bbb-3(b)(1), unless the authorization is terminated or revoked sooner.     Influenza A by PCR NEGATIVE NEGATIVE Final   Influenza B by PCR NEGATIVE NEGATIVE Final    Comment: (NOTE) The Xpert Xpress SARS-CoV-2/FLU/RSV plus assay is intended as an aid in the diagnosis of influenza from Nasopharyngeal swab specimens and should not be used as a sole basis for treatment. Nasal washings and aspirates are unacceptable for Xpert Xpress SARS-CoV-2/FLU/RSV testing.  Fact Sheet for Patients: EntrepreneurPulse.com.au  Fact Sheet for Healthcare Providers: IncredibleEmployment.be  This test is not yet approved or cleared by the [place] FDA and has been authorized for detection and/or diagnosis of SARS-CoV-2 by FDA under an Emergency Use Authorization (EUA). This EUA will remain in effect (meaning this test can be used) for the duration of the COVID-19 declaration under Section 564(b)(1) of the Act, 21 U.S.C. section 360bbb-3(b)(1), unless the authorization is terminated or revoked.  Performed at [facility], [place] [address] [place], [place] [address]   Blood Culture (routine x 2)     Status: None   Collection Time: 10/05/20 12:00 PM   Specimen: BLOOD  Result Value Ref Range Status   Specimen Description   Final    BLOOD RIGHT ANTECUBITAL Performed at [place] [address] [place], [place] [address]    Special Requests   Final    BOTTLES DRAWN AEROBIC AND ANAEROBIC Blood Culture results may not be optimal due to an inadequate volume of blood received in culture  bottles Performed at [facility] [address], [place], [place] [address]    Culture   Final    NO GROWTH 5 DAYS Performed at [facility], [place] [address] [place], [place] [address]    Report Status 10/10/2020 FINAL  Final  Body fluid culture w Gram Stain     Status: None   Collection Time: 10/05/20  3:07 PM   Specimen: Peritoneal Cavity; Peritoneal Fluid  Result Value Ref Range Status   Specimen Description   Final    PERITONEAL CAVITY Performed at [place] [address], [place], [place] [address]    Special Requests   Final    NONE Performed at [facility], [place] [address] [place], [place] [address]    Gram Stain   Final    NO SQUAMOUS EPITHELIAL CELLS SEEN FEW WBC SEEN NO ORGANISMS SEEN    Culture   Final    NO GROWTH Performed at [facility], [place] [address], [place], [place] [address]    Report Status 10/08/2020 FINAL  Final  Culture, body fluid w Gram Stain-bottle     Status: None   Collection Time: 10/05/20  5:02 PM   Specimen: Fluid  Result Value Ref Range Status   Specimen Description FLUID RIGHT PERITONEAL  Final   Special Requests   Final    BOTTLES DRAWN AEROBIC AND ANAEROBIC Blood Culture adequate volume   Culture   Final    NO GROWTH 5 DAYS Performed at [facility], [place] [address], [place],  [address]    Report Status 10/10/2020 FINAL  Final  Gram stain     Status: None   Collection Time: 10/05/20  5:02 PM   Specimen: Fluid  Result Value Ref Range Status   Specimen Description FLUID RIGHT PERITONEAL  Final  Special Requests NONE  Final   Gram Stain   Final    RARE SQUAMOUS EPITHELIAL CELLS PRESENT RARE WBC SEEN NO ORGANISMS SEEN Performed at [facility], [address] [place], [place] [address]    Report Status 10/06/2020 FINAL  Final  Urine Culture     Status: Abnormal   Collection Time: 10/05/20  9:19 PM   Specimen: In/Out Cath Urine   Result Value Ref Range Status   Specimen Description   Final    IN/OUT CATH URINE Performed at [place] [address] [place], [place] [address]    Special Requests   Final    NONE Performed at [facility], [place] [address], [place], [place] [address]    Culture 10,000 COLONIES/mL STAPHYLOCOCCUS SIMULANS (A)  Final   Report Status 10/08/2020 FINAL  Final   Organism ID, Bacteria STAPHYLOCOCCUS SIMULANS (A)  Final      Susceptibility   Staphylococcus simulans - MIC*    CIPROFLOXACIN <=0.5 SENSITIVE Sensitive     GENTAMICIN <=0.5 SENSITIVE Sensitive     NITROFURANTOIN <=16 SENSITIVE Sensitive     OXACILLIN <=0.25 SENSITIVE Sensitive     TETRACYCLINE <=1 SENSITIVE Sensitive     VANCOMYCIN <=0.5 SENSITIVE Sensitive     TRIMETH/SULFA <=10 SENSITIVE Sensitive     CLINDAMYCIN <=0.25 SENSITIVE Sensitive     RIFAMPIN <=0.5 SENSITIVE Sensitive     Inducible Clindamycin NEGATIVE Sensitive     * 10,000 COLONIES/mL STAPHYLOCOCCUS SIMULANS  Body fluid culture w Gram Stain     Status: None   Collection Time: 10/07/20 12:40 PM   Specimen: Peritoneal Washings  Result Value Ref Range Status   Specimen Description   Final    PERITONEAL Performed at [place] [address] [place], [place] [address]    Special Requests   Final    NONE Performed at [facility], [place] [address], [place], [place] [address]    Gram Stain   Final    RARE WBC PRESENT,BOTH PMN AND MONONUCLEAR NO ORGANISMS SEEN    Culture   Final    NO GROWTH Performed at [facility], [place] [address] [place], [place] [address]    Report Status 10/11/2020 FINAL  Final          Radiology Studies: DG Abd 2 Views  Result Date: 10/12/2020 CLINICAL DATA:  Ileus.  Abdominal pain.  Distension. EXAM: ABDOMEN - 2 VIEW COMPARISON:  Two-view abdomen 10/10/2020 FINDINGS: Slightly dilated small bowel again noted in the left abdomen without  significant change. Gas is present in the colon. No definite free air present on supine images. Asymmetric left basilar airspace disease is present. IMPRESSION: 1. Stable ileus pattern. 2. Asymmetric left basilar airspace disease. Pneumonia is not excluded. Electronically Signed   By: San Morelle M.D.   On: 10/12/2020 11:30        Scheduled Meds:  (feeding supplement) PROSource Plus  30 mL Oral TID BM   bisacodyl  10 mg Rectal Once   feeding supplement  1 Container Oral Q24H   heparin  5,000 Units Subcutaneous Q12H   lidocaine       multivitamin with minerals  1 tablet Oral Daily   pantoprazole (PROTONIX) IV  40 mg Intravenous Q12H   peg 3350 powder  0.5 kit Oral Once   And   peg 3350 powder  0.5 kit Oral Once   senna-docusate  1 tablet Oral BID  simvastatin  20 mg Oral QHS   sodium bicarbonate  650 mg Oral BID   Continuous Infusions:     LOS: 7 days    Time spent: 40 minutes    Irine Seal, MD [facility]   To contact the attending provider between 7A-7P or the covering provider during after hours 7P-7A, please log into the web site [email or web address] and access using universal [place] password for that web site. If you do not have the password, please call the hospital operator.  10/12/2020, 1:24 PM

## 2020-10-12 NOTE — Progress Notes (Signed)
Chief Complaint: Patient was seen in consultation today for port placement   Referring Physician(s): Dr. Marin Olp  Supervising Physician: Corrie Mckusick  Patient Status: [facility] - In-pt  History of Present Illness: Beth Sanchez is a 65 y.o. female admitted with abdominal pain and distention. She has had a couple paracentesis with known underlying cirrhosis. However, cytology was positive for malignant cells. She is currently under ongoing workup for this with oncology suspicious for GYN or GI cource. She also has incidental covid+ status, however no symptoms associated with this. Review on oncology notes suggest plans for initiation of inpt chemotherapy this week, possibly Wednesday and they have asked IR for placement of port. PMHx, meds, labs, imaging, allergies reviewed.  Pt back down to IR for paracentesis again today, 5.3 L removed, see full report.    Past Medical History:  Diagnosis Date   Hypertension    Psoriasis     Past Surgical History:  Procedure Laterality Date   BIOPSY  10/08/2020   Procedure: BIOPSY;  Surgeon: Jackquline Denmark, MD;  Location: [facility];  Service: Endoscopy;;   ESOPHAGOGASTRODUODENOSCOPY (EGD) WITH PROPOFOL N/A 10/08/2020   Procedure: ESOPHAGOGASTRODUODENOSCOPY (EGD) WITH PROPOFOL;  Surgeon: Jackquline Denmark, MD;  Location: [facility];  Service: Endoscopy;  Laterality: N/A;    Allergies: Patient has no known allergies.  Medications:  Current Facility-Administered Medications:    (feeding supplement) PROSource Plus liquid 30 mL, 30 mL, Oral, TID BM, Eugenie Filler, MD, 30 mL at 10/10/20 2004   acetaminophen (TYLENOL) tablet 650 mg, 650 mg, Oral, Q6H PRN, 650 mg at 10/12/20 0934 **OR** acetaminophen (TYLENOL) suppository 650 mg, 650 mg, Rectal, Q6H PRN, Lequita Halt, MD   feeding supplement (BOOST / RESOURCE BREEZE) liquid 1 Container, 1 Container, Oral, Q24H, Eugenie Filler, MD   heparin injection 5,000 Units, 5,000 Units,  Subcutaneous, Q12H, [place], Ping T, MD, 5,000 Units at 10/12/20 0934   HYDROmorphone (DILAUDID) injection 0.5 mg, 0.5 mg, Intravenous, Q4H PRN, Eugenie Filler, MD, 0.5 mg at 10/12/20 1142   lidocaine (XYLOCAINE) 1 % (with pres) injection, , , ,    metoprolol tartrate (LOPRESSOR) injection 5 mg, 5 mg, Intravenous, Q3H PRN, Eugenie Filler, MD   multivitamin with minerals tablet 1 tablet, 1 tablet, Oral, Daily, Eugenie Filler, MD, 1 tablet at 10/12/20 0935   ondansetron [facility]) tablet 4 mg, 4 mg, Oral, Q6H PRN, 4 mg at 10/07/20 2231 **OR** ondansetron (ZOFRAN) injection 4 mg, 4 mg, Intravenous, Q6H PRN, Wynetta Fines T, MD, 4 mg at 10/12/20 1137   oxyCODONE (Oxy IR/ROXICODONE) immediate release tablet 5 mg, 5 mg, Oral, Q4H PRN, Wynetta Fines T, MD, 5 mg at 10/12/20 0934   pantoprazole (PROTONIX) injection 40 mg, 40 mg, Intravenous, Q12H, Eugenie Filler, MD, 40 mg at 10/12/20 0950   peg 3350 powder (MOVIPREP) kit 100 g, 0.5 kit, Oral, Once **AND** peg 3350 powder (MOVIPREP) kit 100 g, 0.5 kit, Oral, Once, Eugenie Filler, MD   senna-docusate (Senokot-S) tablet 1 tablet, 1 tablet, Oral, BID, Eugenie Filler, MD, 1 tablet at 10/12/20 1324   simvastatin (ZOCOR) tablet 20 mg, 20 mg, Oral, QHS, Wynetta Fines T, MD, 20 mg at 10/11/20 2233   sodium bicarbonate tablet 650 mg, 650 mg, Oral, BID, Eugenie Filler, MD, 650 mg at 10/12/20 4010    Family History  Problem Relation Age of Onset   Stroke Mother    Heart disease Sister    Heart attack Maternal Grandfather 106   Diabetes  Other    Colon cancer Other    Lung cancer Other    Cancer Other        Bladder cancer   Hypertension Other    Thyroid disease Other        Hypothyroid    Social History   Socioeconomic History   Marital status: Widowed    Spouse name: Not on file   Number of children: Not on file   Years of education: Not on file   Highest education level: Not on file  Occupational History   Not on file  Tobacco  Use   Smoking status: Never   Smokeless tobacco: Never  Substance and Sexual Activity   Alcohol use: No   Drug use: No   Sexual activity: Not on file  Other Topics Concern   Not on file  Social History Narrative   Not on file   Social Determinants of Health   Financial Resource Strain: Not on file  Food Insecurity: Not on file  Transportation Needs: Not on file  Physical Activity: Not on file  Stress: Not on file  Social Connections: Not on file    Review of Systems: A 12 point ROS discussed and pertinent positives are indicated in the HPI above.  All other systems are negative.  Review of Systems  Vital Signs: BP (!) 82/65 (BP Location: Right Arm)   Pulse (!) 110   Temp 97.9 F (36.6 C)   Resp 20   Ht '5\' 7"'  (1.702 m)   Wt 87.7 kg   SpO2 99%   BMI 30.28 kg/m   Physical Exam Constitutional:      General: She is not in acute distress.    Appearance: She is obese. She is not ill-appearing.  HENT:     Mouth/Throat:     Mouth: Mucous membranes are moist.     Pharynx: Oropharynx is clear.  Cardiovascular:     Rate and Rhythm: Normal rate and regular rhythm.     Heart sounds: Normal heart sounds.  Pulmonary:     Effort: Pulmonary effort is normal. No respiratory distress.     Breath sounds: Normal breath sounds.  Abdominal:     General: There is distension.     Palpations: Abdomen is soft. There is no mass.     Tenderness: There is no abdominal tenderness. There is no guarding.  Skin:    General: Skin is warm and dry.     Coloration: Skin is not jaundiced.  Neurological:     General: No focal deficit present.     Mental Status: She is alert and oriented to person, place, and time.  Psychiatric:        Mood and Affect: Mood normal.        Thought Content: Thought content normal.      Imaging: CT ABDOMEN PELVIS WO CONTRAST  Result Date: 10/05/2020 CLINICAL DATA:  Abdominal distension and pain. Vomiting and diarrhea. Sepsis. EXAM: CT ABDOMEN AND PELVIS  WITHOUT CONTRAST TECHNIQUE: Multidetector CT imaging of the abdomen and pelvis was performed following the standard protocol without IV contrast. COMPARISON:  None. FINDINGS: Lower chest: No acute findings. Hepatobiliary: No mass visualized on this unenhanced exam. 2 cm gallstone is noted, however there are no signs of acute cholecystitis or biliary ductal dilatation. Pancreas: No mass or inflammatory process visualized on this unenhanced exam. Spleen:  Within normal limits in size. Adrenals/Urinary tract: No evidence of urolithiasis or hydronephrosis. Unremarkable unopacified urinary bladder. Stomach/Bowel: No evidence of  obstruction, inflammatory process, or abnormal fluid collections. Diverticulosis is seen mainly involving the sigmoid colon, however there is no evidence of diverticulitis. Vascular/Lymphatic: No pathologically enlarged lymph nodes identified. No evidence of abdominal aortic aneurysm. Aortic atherosclerotic calcification noted. Reproductive:  No mass or other significant abnormality. Other: Large amount of ascites is seen throughout the abdomen and pelvis. Musculoskeletal:  No suspicious bone lesions identified. IMPRESSION: Large amount of ascites. Cholelithiasis. No radiographic evidence of acute cholecystitis. Mild sigmoid diverticulosis, without radiographic evidence of diverticulitis. Aortic Atherosclerosis (ICD10-I70.0). Electronically Signed   By: Marlaine Hind M.D.   On: 10/05/2020 13:29   CT CHEST WO CONTRAST  Result Date: 10/07/2020 CLINICAL DATA:  Unintentional weight loss. Clinical concern for malignancy. EXAM: CT CHEST WITHOUT CONTRAST TECHNIQUE: Multidetector CT imaging of the chest was performed following the standard protocol without IV contrast. COMPARISON:  Radiograph 10/05/2020 FINDINGS: Cardiovascular: Aortic atherosclerosis. Heart is normal in size. There are coronary artery calcifications. No pericardial effusion. Mediastinum/Nodes: No enlarged mediastinal lymph nodes.  Limited assessment for hilar adenopathy in this unenhanced exam. Slightly patulous esophagus. No esophageal wall thickening. No thyroid nodule. There is no axillary adenopathy. Lungs/Pleura: Chest bilateral pleural effusions. Elevation of left hemidiaphragm with adjacent compressive atelectasis at the left lung base. No confluent airspace disease. There is no dominant pulmonary mass. There are tiny bilateral pulmonary nodules within both lungs, the majority of which are subpleural or perifissural. Most of these are in the right lung, and majority are millimetric, with largest in the right upper lobe measuring 3 mm series 7, image 38, and 3 mm perifissural left upper lobe series 7, image 43. Additional tiny nodules are seen in the right lung on series 7, images 18, 28, 83 and 95. The trachea and central bronchi are patent. Upper Abdomen: Assessed on recent abdominopelvic CT. Moderate to large volume ascites again seen in the upper abdomen. Musculoskeletal: There are no acute or suspicious osseous abnormalities. No evidence of focal bone lesion or bone destruction. Multilevel degenerative change in the spine. Minimal chest wall soft tissue edema. No dominant breast mass or soft tissue abnormality. IMPRESSION: 1. Tiny bilateral pulmonary nodules, the majority of which are subpleural or perifissural. Largest nodule measures 3 mm. Mild nonspecific, none of these nodules are specifically suspicious for malignancy. No follow-up needed if patient is low-risk (and has no known or suspected primary neoplasm). Non-contrast chest CT can be considered in 12 months if patient is high-risk. This recommendation follows the consensus statement: Guidelines for Management of Incidental Pulmonary Nodules Detected on CT Images: From the Fleischner Society 2017; Radiology 2017; 284:228-243. 2. No explanation for weight loss. No findings suspicious for intrathoracic malignancy. 3. Small bilateral pleural effusions. Elevation of left  hemidiaphragm with adjacent compressive atelectasis at the left lung base. 4. Aortic atherosclerosis.  Coronary artery calcifications. Aortic Atherosclerosis (ICD10-I70.0). Electronically Signed   By: Keith Rake M.D.   On: 10/07/2020 19:55   US PELVIS TRANSVAGINAL NON-OB (TV ONLY)  Result Date: 10/10/2020 CLINICAL DATA:  History of primary peritoneal carcinoma. EXAM: ULTRASOUND PELVIS TRANSVAGINAL TECHNIQUE: Transvaginal ultrasound examination of the pelvis was performed including evaluation of the uterus, ovaries, adnexal regions, and pelvic cul-de-sac. COMPARISON:  10/05/2020 abdominopelvic CT. FINDINGS: Uterus Measurements: 8.6 x 4.6 x 5.1 cm = volume: 105 mL. Uterine fundal heterogeneous hypoechoic mass is favored to represent a fibroid. 4.1 x 3.9 x 3.8 cm Endometrium Not confidently visualized secondary to the presumed fibroid. Right ovary Measurements: 5.0 x 4.5 x 3.1 cm = volume: 36 mL. Minimally septated  cystic lesion within measures 4.6 x 2.9 x 2.7 cm. No internal vascularity. Left ovary Not visualized Other findings:  Moderate volume fluid, as on CT. IMPRESSION: 1. Uterine fundal mass, likely a fibroid. 2. Right ovarian minimally complex cystic lesion is indeterminate. Given the appearance of the abdomen and pelvis on CT, consider nonemergent (optimally outpatient) pre and post-contrast pelvic MRI. 3. Lack of visualization of the left ovary. Electronically Signed   By: Abigail Miyamoto M.D.   On: 10/10/2020 07:25   US Paracentesis  Result Date: 10/07/2020 INDICATION: Patient with history of acute kidney injury, COVID-19, anemia, abdominal distension/recurrent ascites; request received for diagnostic and therapeutic paracentesis up to 6 liters. EXAM: ULTRASOUND GUIDED DIAGNOSTIC AND THERAPEUTIC  PARACENTESIS MEDICATIONS: 1% lidocaine to skin/SQ tissue COMPLICATIONS: None immediate. PROCEDURE: Informed written consent was obtained from the patient after a discussion of the risks, benefits and  alternatives to treatment. A timeout was performed prior to the initiation of the procedure. Initial ultrasound scanning demonstrates a moderate amount of ascites within the right lower abdominal quadrant. The right lower abdomen was prepped and draped in the usual sterile fashion. 1% lidocaine was used for local anesthesia. Following this, a 19 gauge, 10-cm, Yueh catheter was introduced. An ultrasound image was saved for documentation purposes. The paracentesis was performed. The catheter was removed and a dressing was applied. The patient tolerated the procedure well without immediate post procedural complication. Patient currently receiving IV albumin. FINDINGS: A total of approximately 3.3 liters of hazy,dark amber fluid was removed. Samples were sent to the laboratory as requested by the clinical team. IMPRESSION: Successful ultrasound-guided diagnostic and therapeutic paracentesis yielding 3.3 liters of peritoneal fluid. Read by: Rowe Robert, PA-C Electronically Signed   By: Miachel Roux M.D.   On: 10/07/2020 14:18   DG Chest Port 1 View  Result Date: 10/05/2020 CLINICAL DATA:  Questionable sepsis EXAM: PORTABLE CHEST 1 VIEW COMPARISON:  None. FINDINGS: The patient is rotated to the right. The cardiomediastinal silhouette is within normal limits, allowing for low lung volumes and AP technique. Lung volumes are low. There is no focal consolidation or pulmonary edema. There is no pleural effusion or pneumothorax. There is no acute osseous abnormality. IMPRESSION: Low lung volumes. Otherwise, no radiographic evidence of acute cardiopulmonary process. Electronically Signed   By: Valetta Mole M.D.   On: 10/05/2020 12:26   DG Abd 2 Views  Result Date: 10/12/2020 CLINICAL DATA:  Ileus.  Abdominal pain.  Distension. EXAM: ABDOMEN - 2 VIEW COMPARISON:  Two-view abdomen 10/10/2020 FINDINGS: Slightly dilated small bowel again noted in the left abdomen without significant change. Gas is present in the colon. No  definite free air present on supine images. Asymmetric left basilar airspace disease is present. IMPRESSION: 1. Stable ileus pattern. 2. Asymmetric left basilar airspace disease. Pneumonia is not excluded. Electronically Signed   By: San Morelle M.D.   On: 10/12/2020 11:30   DG Abd 2 Views  Result Date: 10/10/2020 CLINICAL DATA:  Vomiting and generalized abdominal pain. EXAM: ABDOMEN - 2 VIEW COMPARISON:  10/09/2020 FINDINGS: There is a mildly prominent loop of small bowel in the LEFT mid abdomen, similar in appearance to prior study. Findings are consistent with focal ileus or early obstruction. Gas and stool are seen throughout the colon. Degenerative changes are noted in the spine. IMPRESSION: Persistent mild dilatation of small bowel loop in the LEFT mid abdomen. Electronically Signed   By: Nolon Nations M.D.   On: 10/10/2020 12:10   DG Abd Portable 1V  Result Date: 10/09/2020 CLINICAL DATA:  Intermittent abdominal pain with distention. EXAM: PORTABLE ABDOMEN - 1 VIEW COMPARISON:  None. FINDINGS: Single mildly dilated small bowel loop in the left abdomen. Air and stool in the rectum. No radio-opaque calculi or other significant radiographic abnormality are seen. No acute osseous abnormality. IMPRESSION: 1. Single mildly dilated small bowel loop in the left abdomen, favor focal ileus. Electronically Signed   By: Titus Dubin M.D.   On: 10/09/2020 11:30   ECHOCARDIOGRAM COMPLETE  Result Date: 10/06/2020    ECHOCARDIOGRAM REPORT   Patient Name:   ARLO BUFFONE Date of Exam: 10/06/2020 Medical Rec #:  [id number]      Height:       67.0 in Accession #:    [id number]     Weight:       193.3 lb Date of Birth:  23-Feb-1955      BSA:          1.993 m Patient Age:    10 years       BP:           94/71 mmHg Patient Gender: F              HR:           100 bpm. Exam Location:  Inpatient Procedure: 2D Echo, Cardiac Doppler and Color Doppler                       STAT ECHO Reported to: Dr Jenkins Rouge  on 10/06/2020 1:26:00 PM. Indications:    Ascites [id number]  History:        Patient has no prior history of Echocardiogram examinations.                 Risk Factors:Hypertension. COVID-19 Positive.  Sonographer:    Tiffany Dance RVT Referring Phys: [id number] Soldier  1. Ascites noted on sub costal images Would not appear to be cardiac related.  2. Left ventricular ejection fraction, by estimation, is 60 to 65%. The left ventricle has normal function. The left ventricle has no regional wall motion abnormalities. Left ventricular diastolic parameters are consistent with Grade I diastolic dysfunction (impaired relaxation).  3. Right ventricular systolic function is normal. The right ventricular size is normal. There is normal pulmonary artery systolic pressure.  4. Left atrial size was mildly dilated.  5. The mitral valve is abnormal. No evidence of mitral valve regurgitation. No evidence of mitral stenosis.  6. The aortic valve is tricuspid. There is mild calcification of the aortic valve. Aortic valve regurgitation is not visualized. Mild aortic valve sclerosis is present, with no evidence of aortic valve stenosis.  7. The inferior vena cava is normal in size with greater than 50% respiratory variability, suggesting right atrial pressure of 3 mmHg. FINDINGS  Left Ventricle: Left ventricular ejection fraction, by estimation, is 60 to 65%. The left ventricle has normal function. The left ventricle has no regional wall motion abnormalities. The left ventricular internal cavity size was normal in size. There is  no left ventricular hypertrophy. Left ventricular diastolic parameters are consistent with Grade I diastolic dysfunction (impaired relaxation). Right Ventricle: The right ventricular size is normal. No increase in right ventricular wall thickness. Right ventricular systolic function is normal. There is normal pulmonary artery systolic pressure. The tricuspid regurgitant velocity is 2.02  m/s, and  with an assumed right atrial pressure of 3 mmHg, the estimated right ventricular systolic pressure is 00.4 mmHg. Left Atrium:  Left atrial size was mildly dilated. Right Atrium: Right atrial size was normal in size. Pericardium: There is no evidence of pericardial effusion. Mitral Valve: The mitral valve is abnormal. There is mild thickening of the mitral valve leaflet(s). There is mild calcification of the mitral valve leaflet(s). Mild mitral annular calcification. No evidence of mitral valve regurgitation. No evidence of mitral valve stenosis. Tricuspid Valve: The tricuspid valve is normal in structure. Tricuspid valve regurgitation is mild . No evidence of tricuspid stenosis. Aortic Valve: The aortic valve is tricuspid. There is mild calcification of the aortic valve. Aortic valve regurgitation is not visualized. Mild aortic valve sclerosis is present, with no evidence of aortic valve stenosis. Pulmonic Valve: The pulmonic valve was normal in structure. Pulmonic valve regurgitation is not visualized. No evidence of pulmonic stenosis. Aorta: The aortic root is normal in size and structure. Venous: The inferior vena cava is normal in size with greater than 50% respiratory variability, suggesting right atrial pressure of 3 mmHg. IAS/Shunts: No atrial level shunt detected by color flow Doppler. Additional Comments: Ascites noted on sub costal images Would not appear to be cardiac related.  LEFT VENTRICLE PLAX 2D LVIDd:         3.70 cm  Diastology LVIDs:         2.30 cm  LV e' medial:    7.40 cm/s LV PW:         1.10 cm  LV E/e' medial:  9.8 LV IVS:        1.00 cm  LV e' lateral:   10.40 cm/s LVOT diam:     2.10 cm  LV E/e' lateral: 6.9 LV SV:         63 LV SV Index:   31 LVOT Area:     3.46 cm  RIGHT VENTRICLE             IVC RV Basal diam:  2.30 cm     IVC diam: 1.80 cm RV S prime:     12.10 cm/s TAPSE (M-mode): 2.0 cm LEFT ATRIUM             Index       RIGHT ATRIUM          Index LA diam:        3.80 cm  1.91 cm/m  RA Area:     8.79 cm LA Vol (A2C):   27.3 ml 13.70 ml/m RA Volume:   15.70 ml 7.88 ml/m LA Vol (A4C):   39.1 ml 19.62 ml/m LA Biplane Vol: 33.2 ml 16.66 ml/m  AORTIC VALVE LVOT Vmax:   130.00 cm/s LVOT Vmean:  81.300 cm/s LVOT VTI:    0.180 m  AORTA Ao Root diam: 3.20 cm Ao Asc diam:  3.30 cm MITRAL VALVE                TRICUSPID VALVE MV Area (PHT): 4.31 cm     TR Peak grad:   16.3 mmHg MV Decel Time: 176 msec     TR Vmax:        202.00 cm/s MV E velocity: 72.20 cm/s MV A velocity: 122.00 cm/s  SHUNTS MV E/A ratio:  0.59         Systemic VTI:  0.18 m                             Systemic Diam: 2.10 cm Jenkins Rouge MD Electronically signed by Jenkins Rouge MD  Signature Date/Time: 10/06/2020/1:35:19 PM    Final    US Abdomen Limited RUQ (LIVER/GB)  Result Date: 10/05/2020 CLINICAL DATA:  Right upper quadrant abdominal pain EXAM: ULTRASOUND ABDOMEN LIMITED RIGHT UPPER QUADRANT COMPARISON:  10/05/2020 CT abdomen/pelvis FINDINGS: Gallbladder: Nondistended gallbladder is completely filled with a densely shadowing 3.6 cm gallstone. No gallbladder wall thickening. No sonographic Murphy's sign. Common bile duct: Diameter: 6 mm on limited views, top-normal Liver: Coarsened liver parenchyma with questionable liver surface irregularity, which may indicate cirrhosis. No liver mass demonstrated on these limited views. Portal vein is patent on color Doppler imaging with normal direction of blood flow towards the liver. Other: Large volume ascites in the upper abdomen. IMPRESSION: 1. Cholelithiasis.  No evidence of acute cholecystitis. 2. Top normal caliber common bile duct, 6 mm diameter. 3. Coarsened liver parenchyma with questionable liver surface irregularity, which may indicate cirrhosis. No liver mass detected on these limited views. Suggest correlation with liver function tests. 4. Large volume upper abdominal ascites. Electronically Signed   By: Ilona Sorrel M.D.   On: 10/05/2020 14:37     Labs:  CBC: Recent Labs    10/09/20 0401 10/10/20 0416 10/11/20 0842 10/12/20 0349  WBC 12.4* 9.4 8.6 7.5  HGB 11.3* 10.8* 11.9* 11.7*  HCT 36.6 34.6* 40.0 38.4  PLT 286 258 176 286    COAGS: Recent Labs    10/05/20 1136  INR 1.1  APTT 30    BMP: Recent Labs    10/09/20 0401 10/09/20 0915 10/10/20 0416 10/11/20 0842 10/12/20 0349  NA 141  --  137 139 137  K 5.3* 5.5* 5.0 5.0 4.9  CL 108  --  106 112* 108  CO2 17*  --  20* 16* 19*  GLUCOSE 112*  --  89 94 92  BUN 52*  --  52* 39* 40*  CALCIUM 9.8  --  9.5 9.5 9.7  CREATININE 1.85*  --  1.73* 1.23* 1.38*  GFRNONAA 30*  --  32* 49* 42*    LIVER FUNCTION TESTS: Recent Labs    10/05/20 1136 10/06/20 0339 10/07/20 0407 10/08/20 0506 10/10/20 0416 10/11/20 0842  BILITOT 0.7 0.8 0.8 1.0  --   --   AST 16 13* 13* 14*  --   --   ALT '12 10 9 9  ' --   --   ALKPHOS 122 93 75 73  --   --   PROT 7.4 6.5 6.6 6.7  --   --   ALBUMIN 3.0* 3.2* 3.8  3.8 3.9 3.2* 3.2*    TUMOR MARKERS: No results for input(s): AFPTM, CEA, CA199, CHROMGRNA in the last 8760 hours.  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 IR can place port as early as tomorrow. Labs reviewed. Risks and benefits of image guided port-a-catheter placement was discussed with the patient including, but not limited to bleeding, infection, pneumothorax, or fibrin sheath development and need for additional procedures.  All of the patient's questions were answered, patient is agreeable to proceed. Consent signed and in chart. NPO p MN Hold tomorrow am subq heparin   Thank you for this interesting consult.  I greatly enjoyed meeting Beth Sanchez and look forward to participating in their care.  A copy of this report was sent to the requesting provider on this date.  Electronically Signed: Ascencion Dike, PA-C 10/12/2020, 12:52 PM   I spent a total of  20 minutes in face to  face in clinical consultation, greater than 50% of which was counseling/coordinating care for port placement

## 2020-10-12 NOTE — Procedures (Signed)
PROCEDURE SUMMARY:  Successful US guided paracentesis from RLQ.  Yielded 5.3 L of clear yellow fluid.  No immediate complications.  Pt tolerated well.   Specimen was sent for labs.  EBL < 88m  KAscencion DikePA-C 10/12/2020 12:50 PM

## 2020-10-12 NOTE — Care Management Important Message (Signed)
Important Message  Patient Details IM Letter given to the Patient. Name: Beth Sanchez MRN: [id number] Date of Birth: 01-25-1956   Medicare Important Message Given:  Yes     Kerin Salen 10/12/2020, 12:58 PM

## 2020-10-13 ENCOUNTER — Inpatient Hospital Stay (HOSPITAL_COMMUNITY): Payer: Medicare Other

## 2020-10-13 ENCOUNTER — Encounter: Payer: Self-pay | Admitting: *Deleted

## 2020-10-13 DIAGNOSIS — N179 Acute kidney failure, unspecified: Secondary | ICD-10-CM | POA: Diagnosis not present

## 2020-10-13 DIAGNOSIS — R180 Malignant ascites: Secondary | ICD-10-CM | POA: Diagnosis not present

## 2020-10-13 DIAGNOSIS — E1169 Type 2 diabetes mellitus with other specified complication: Secondary | ICD-10-CM | POA: Diagnosis not present

## 2020-10-13 DIAGNOSIS — R188 Other ascites: Secondary | ICD-10-CM | POA: Diagnosis not present

## 2020-10-13 DIAGNOSIS — I10 Essential (primary) hypertension: Secondary | ICD-10-CM | POA: Diagnosis not present

## 2020-10-13 HISTORY — PX: IR IMAGING GUIDED PORT INSERTION: IMG5740

## 2020-10-13 LAB — RENAL FUNCTION PANEL
Albumin: 3.9 g/dL (ref 3.5–5.0)
Anion gap: 12 (ref 5–15)
BUN: 36 mg/dL — ABNORMAL HIGH (ref 8–23)
CO2: 17 mmol/L — ABNORMAL LOW (ref 22–32)
Calcium: 9.8 mg/dL (ref 8.9–10.3)
Chloride: 108 mmol/L (ref 98–111)
Creatinine, Ser: 1.24 mg/dL — ABNORMAL HIGH (ref 0.44–1.00)
GFR, Estimated: 48 mL/min — ABNORMAL LOW (ref >60)
Glucose, Bld: 70 mg/dL (ref 70–99)
Phosphorus: 3.4 mg/dL (ref 2.5–4.6)
Potassium: 4.7 mmol/L (ref 3.5–5.1)
Sodium: 137 mmol/L (ref 135–145)

## 2020-10-13 LAB — CBC WITH DIFFERENTIAL/PLATELET
Abs Immature Granulocytes: 0.04 10*3/uL (ref 0.00–0.07)
Basophils Absolute: 0 10*3/uL (ref 0.0–0.1)
Basophils Relative: 0 %
Eosinophils Absolute: 0 10*3/uL (ref 0.0–0.5)
Eosinophils Relative: 0 %
HCT: 32.2 % — ABNORMAL LOW (ref 36.0–46.0)
Hemoglobin: 10.1 g/dL — ABNORMAL LOW (ref 12.0–15.0)
Immature Granulocytes: 1 %
Lymphocytes Relative: 5 %
Lymphs Abs: 0.3 10*3/uL — ABNORMAL LOW (ref 0.7–4.0)
MCH: 26.9 pg (ref 26.0–34.0)
MCHC: 31.4 g/dL (ref 30.0–36.0)
MCV: 85.6 fL (ref 80.0–100.0)
Monocytes Absolute: 0.3 10*3/uL (ref 0.1–1.0)
Monocytes Relative: 4 %
Neutro Abs: 5.7 10*3/uL (ref 1.7–7.7)
Neutrophils Relative %: 90 %
Platelets: 198 10*3/uL (ref 150–400)
RBC: 3.76 MIL/uL — ABNORMAL LOW (ref 3.87–5.11)
RDW: 16.7 % — ABNORMAL HIGH (ref 11.5–15.5)
WBC: 6.3 10*3/uL (ref 4.0–10.5)
nRBC: 0 % (ref 0.0–0.2)

## 2020-10-13 LAB — SURGICAL PATHOLOGY

## 2020-10-13 LAB — CYTOLOGY - NON PAP

## 2020-10-13 MED ORDER — LIDOCAINE-EPINEPHRINE 1 %-1:100000 IJ SOLN
INTRAMUSCULAR | Status: AC
  Filled 2020-10-13: qty 1

## 2020-10-13 MED ORDER — LIDOCAINE VISCOUS HCL 2 % MT SOLN
15.0000 mL | Freq: Once | OROMUCOSAL | Status: DC
Start: 2020-10-13 — End: 2020-11-09
  Filled 2020-10-13: qty 15

## 2020-10-13 MED ORDER — SODIUM BICARBONATE 650 MG PO TABS
1300.0000 mg | ORAL_TABLET | Freq: Two times a day (BID) | ORAL | Status: AC
  Administered 2020-10-13 – 2020-10-15 (×6): 1300 mg via ORAL
  Filled 2020-10-13 (×5): qty 2

## 2020-10-13 MED ORDER — MIDAZOLAM HCL 2 MG/2ML IJ SOLN
INTRAMUSCULAR | Status: AC
  Filled 2020-10-13: qty 2

## 2020-10-13 MED ORDER — FENTANYL CITRATE (PF) 100 MCG/2ML IJ SOLN
INTRAMUSCULAR | Status: AC
  Filled 2020-10-13: qty 2

## 2020-10-13 MED ORDER — FENTANYL CITRATE (PF) 100 MCG/2ML IJ SOLN
INTRAMUSCULAR | Status: AC | PRN
  Administered 2020-10-13: 25 ug via INTRAVENOUS
  Administered 2020-10-13: 50 ug via INTRAVENOUS

## 2020-10-13 MED ORDER — HEPARIN SOD (PORK) LOCK FLUSH 100 UNIT/ML IV SOLN
INTRAVENOUS | Status: AC
  Filled 2020-10-13: qty 5

## 2020-10-13 MED ORDER — CHLORHEXIDINE GLUCONATE CLOTH 2 % EX PADS
6.0000 | MEDICATED_PAD | Freq: Every day | CUTANEOUS | Status: DC
  Administered 2020-10-13 – 2020-10-21 (×9): 6 via TOPICAL

## 2020-10-13 MED ORDER — LIDOCAINE-EPINEPHRINE 1 %-1:100000 IJ SOLN
INTRAMUSCULAR | Status: AC | PRN
  Administered 2020-10-13: 20 mL

## 2020-10-13 MED ORDER — DEXTROSE-NACL 5-0.9 % IV SOLN: INTRAVENOUS | Status: DC

## 2020-10-13 MED ORDER — MIDAZOLAM HCL 2 MG/2ML IJ SOLN
INTRAMUSCULAR | Status: AC | PRN
  Administered 2020-10-13: 1 mg via INTRAVENOUS
  Administered 2020-10-13: 0.5 mg via INTRAVENOUS

## 2020-10-13 MED ORDER — ALUM & MAG HYDROXIDE-SIMETH 200-200-20 MG/5ML PO SUSP
30.0000 mL | Freq: Once | ORAL | Status: AC
  Administered 2020-10-13: 30 mL via ORAL
  Filled 2020-10-13: qty 30

## 2020-10-13 NOTE — Progress Notes (Addendum)
HEMATOLOGY-ONCOLOGY PROGRESS NOTE  SUBJECTIVE: Reports that she has some discomfort "all over." The patient reported some vomiting overnight.  Reports bowels moved overnight.  She had a paracentesis  performed yesterday with over 5 L taken off with improvement of her abdominal distention.  The cytology did come back positive for malignant cells.  We will try to send this off for molecular analysis.  Currently n.p.o. for possible Port-A-Cath placement today.  She is not sure what time this will happen.  Oncology History  Primary peritoneal carcinomatosis ([place])  10/12/2020 Initial Diagnosis   Primary peritoneal carcinomatosis ([place])   10/14/2020 -  Chemotherapy    Patient is on Treatment Plan: OVARIAN CARBOPLATIN + PACLITAXEL + BEVACIZUMAB Q21D           REVIEW OF SYSTEMS:   Constitutional: Denies fevers, chills  Eyes: Denies blurriness of vision Ears, nose, mouth, throat, and face: Denies mucositis or sore throat Respiratory: Denies cough, dyspnea or wheezes Cardiovascular: Denies palpitation, chest discomfort Gastrointestinal: Reported episode of vomiting overnight Skin: Denies abnormal skin rashes Lymphatics: Denies new lymphadenopathy or easy bruising Neurological:Denies numbness, tingling or new weaknesses Behavioral/Psych: Mood is stable, no new changes  Extremities: No lower extremity edema All other systems were reviewed with the patient and are negative.  I have reviewed the past medical history, past surgical history, social history and family history with the patient and they are unchanged from previous note.   PHYSICAL EXAMINATION: ECOG PERFORMANCE STATUS: 2 - Symptomatic, <50% confined to bed  Vitals:   10/12/20 2255 10/13/20 0505  BP: 96/69 92/70  Pulse: 93 93  Resp: 20 16  Temp: 97.8 F (36.6 C) 97.7 F (36.5 C)  SpO2: 100% 99%   Filed Weights   10/06/20 0000 10/08/20 1351  Weight: 87.7 kg 87.7 kg    Intake/Output from previous day: 08/22 0701 - 08/23  0700 In: 282.6 [P.O.:120; IV Piggyback:162.6] Out: 60 [Emesis/NG output:60]  GENERAL:alert, no distress and comfortable SKIN: skin color, texture, turgor are normal, no rashes or significant lesions LUNGS: clear to auscultation and percussion with normal breathing effort HEART: regular rate & rhythm and no murmurs and no lower extremity edema ABDOMEN: Abdomen distended, non-tender Musculoskeletal:no cyanosis of digits and no clubbing  NEURO: alert & oriented x 3 with fluent speech, no focal motor/sensory deficits  LABORATORY DATA:  I have reviewed the data as listed CMP Latest Ref Rng & Units 10/13/2020 10/12/2020 10/11/2020  Glucose 70 - 99 mg/dL 70 92 94  BUN 8 - 23 mg/dL 36(H) 40(H) 39(H)  Creatinine 0.44 - 1.00 mg/dL 1.24(H) 1.38(H) 1.23(H)  Sodium 135 - 145 mmol/L 137 137 139  Potassium 3.5 - 5.1 mmol/L 4.7 4.9 5.0  Chloride 98 - 111 mmol/L 108 108 112(H)  CO2 22 - 32 mmol/L 17(L) 19(L) 16(L)  Calcium 8.9 - 10.3 mg/dL 9.8 9.7 9.5  Total Protein 6.5 - 8.1 g/dL - - -  Total Bilirubin 0.3 - 1.2 mg/dL - - -  Alkaline Phos 38 - 126 U/L - - -  AST 15 - 41 U/L - - -  ALT 0 - 44 U/L - - -    Lab Results  Component Value Date   WBC 6.3 10/13/2020   HGB 10.1 (L) 10/13/2020   HCT 32.2 (L) 10/13/2020   MCV 85.6 10/13/2020   PLT 198 10/13/2020   NEUTROABS 5.7 10/13/2020    CT ABDOMEN PELVIS WO CONTRAST  Result Date: 10/05/2020 CLINICAL DATA:  Abdominal distension and pain. Vomiting and diarrhea. Sepsis. EXAM:  CT ABDOMEN AND PELVIS WITHOUT CONTRAST TECHNIQUE: Multidetector CT imaging of the abdomen and pelvis was performed following the standard protocol without IV contrast. COMPARISON:  None. FINDINGS: Lower chest: No acute findings. Hepatobiliary: No mass visualized on this unenhanced exam. 2 cm gallstone is noted, however there are no signs of acute cholecystitis or biliary ductal dilatation. Pancreas: No mass or inflammatory process visualized on this unenhanced exam. Spleen:   Within normal limits in size. Adrenals/Urinary tract: No evidence of urolithiasis or hydronephrosis. Unremarkable unopacified urinary bladder. Stomach/Bowel: No evidence of obstruction, inflammatory process, or abnormal fluid collections. Diverticulosis is seen mainly involving the sigmoid colon, however there is no evidence of diverticulitis. Vascular/Lymphatic: No pathologically enlarged lymph nodes identified. No evidence of abdominal aortic aneurysm. Aortic atherosclerotic calcification noted. Reproductive:  No mass or other significant abnormality. Other: Large amount of ascites is seen throughout the abdomen and pelvis. Musculoskeletal:  No suspicious bone lesions identified. IMPRESSION: Large amount of ascites. Cholelithiasis. No radiographic evidence of acute cholecystitis. Mild sigmoid diverticulosis, without radiographic evidence of diverticulitis. Aortic Atherosclerosis (ICD10-I70.0). Electronically Signed   By: Marlaine Hind M.D.   On: 10/05/2020 13:29   CT CHEST WO CONTRAST  Result Date: 10/07/2020 CLINICAL DATA:  Unintentional weight loss. Clinical concern for malignancy. EXAM: CT CHEST WITHOUT CONTRAST TECHNIQUE: Multidetector CT imaging of the chest was performed following the standard protocol without IV contrast. COMPARISON:  Radiograph 10/05/2020 FINDINGS: Cardiovascular: Aortic atherosclerosis. Heart is normal in size. There are coronary artery calcifications. No pericardial effusion. Mediastinum/Nodes: No enlarged mediastinal lymph nodes. Limited assessment for hilar adenopathy in this unenhanced exam. Slightly patulous esophagus. No esophageal wall thickening. No thyroid nodule. There is no axillary adenopathy. Lungs/Pleura: Chest bilateral pleural effusions. Elevation of left hemidiaphragm with adjacent compressive atelectasis at the left lung base. No confluent airspace disease. There is no dominant pulmonary mass. There are tiny bilateral pulmonary nodules within both lungs, the majority  of which are subpleural or perifissural. Most of these are in the right lung, and majority are millimetric, with largest in the right upper lobe measuring 3 mm series 7, image 38, and 3 mm perifissural left upper lobe series 7, image 43. Additional tiny nodules are seen in the right lung on series 7, images 18, 28, 83 and 95. The trachea and central bronchi are patent. Upper Abdomen: Assessed on recent abdominopelvic CT. Moderate to large volume ascites again seen in the upper abdomen. Musculoskeletal: There are no acute or suspicious osseous abnormalities. No evidence of focal bone lesion or bone destruction. Multilevel degenerative change in the spine. Minimal chest wall soft tissue edema. No dominant breast mass or soft tissue abnormality. IMPRESSION: 1. Tiny bilateral pulmonary nodules, the majority of which are subpleural or perifissural. Largest nodule measures 3 mm. Mild nonspecific, none of these nodules are specifically suspicious for malignancy. No follow-up needed if patient is low-risk (and has no known or suspected primary neoplasm). Non-contrast chest CT can be considered in 12 months if patient is high-risk. This recommendation follows the consensus statement: Guidelines for Management of Incidental Pulmonary Nodules Detected on CT Images: From the Fleischner Society 2017; Radiology 2017; 284:228-243. 2. No explanation for weight loss. No findings suspicious for intrathoracic malignancy. 3. Small bilateral pleural effusions. Elevation of left hemidiaphragm with adjacent compressive atelectasis at the left lung base. 4. Aortic atherosclerosis.  Coronary artery calcifications. Aortic Atherosclerosis (ICD10-I70.0). Electronically Signed   By: Keith Rake M.D.   On: 10/07/2020 19:55   US PELVIS TRANSVAGINAL NON-OB (TV ONLY)  Result Date:  10/10/2020 CLINICAL DATA:  History of primary peritoneal carcinoma. EXAM: ULTRASOUND PELVIS TRANSVAGINAL TECHNIQUE: Transvaginal ultrasound examination of the  pelvis was performed including evaluation of the uterus, ovaries, adnexal regions, and pelvic cul-de-sac. COMPARISON:  10/05/2020 abdominopelvic CT. FINDINGS: Uterus Measurements: 8.6 x 4.6 x 5.1 cm = volume: 105 mL. Uterine fundal heterogeneous hypoechoic mass is favored to represent a fibroid. 4.1 x 3.9 x 3.8 cm Endometrium Not confidently visualized secondary to the presumed fibroid. Right ovary Measurements: 5.0 x 4.5 x 3.1 cm = volume: 36 mL. Minimally septated cystic lesion within measures 4.6 x 2.9 x 2.7 cm. No internal vascularity. Left ovary Not visualized Other findings:  Moderate volume fluid, as on CT. IMPRESSION: 1. Uterine fundal mass, likely a fibroid. 2. Right ovarian minimally complex cystic lesion is indeterminate. Given the appearance of the abdomen and pelvis on CT, consider nonemergent (optimally outpatient) pre and post-contrast pelvic MRI. 3. Lack of visualization of the left ovary. Electronically Signed   By: Abigail Miyamoto M.D.   On: 10/10/2020 07:25   US Paracentesis  Result Date: 10/12/2020 INDICATION: Abdominal distention secondary to recurrent malignant ascites. Request for diagnostic and therapeutic paracentesis. EXAM: ULTRASOUND GUIDED RIGHT LOWER QUADRANT PARACENTESIS MEDICATIONS: 1% plain lidocaine, 5 mL COMPLICATIONS: None immediate. PROCEDURE: Informed written consent was obtained from the patient after a discussion of the risks, benefits and alternatives to treatment. A timeout was performed prior to the initiation of the procedure. Initial ultrasound scanning demonstrates a large amount of ascites within the right lower abdominal quadrant. The right lower abdomen was prepped and draped in the usual sterile fashion. 1% lidocaine was used for local anesthesia. Following this, a 19 gauge, 7-cm, Yueh catheter was introduced. An ultrasound image was saved for documentation purposes. The paracentesis was performed. The catheter was removed and a dressing was applied. The patient  tolerated the procedure well without immediate post procedural complication. FINDINGS: A total of approximately 5.3 L of clear yellow fluid was removed. Samples were sent to the laboratory as requested by the clinical team. IMPRESSION: Successful ultrasound-guided paracentesis yielding 5.3 liters of peritoneal fluid. Read by: Ascencion Dike PA-C Electronically Signed   By: Corrie Mckusick D.O.   On: 10/12/2020 13:42   US Paracentesis  Result Date: 10/07/2020 INDICATION: Patient with history of acute kidney injury, COVID-19, anemia, abdominal distension/recurrent ascites; request received for diagnostic and therapeutic paracentesis up to 6 liters. EXAM: ULTRASOUND GUIDED DIAGNOSTIC AND THERAPEUTIC  PARACENTESIS MEDICATIONS: 1% lidocaine to skin/SQ tissue COMPLICATIONS: None immediate. PROCEDURE: Informed written consent was obtained from the patient after a discussion of the risks, benefits and alternatives to treatment. A timeout was performed prior to the initiation of the procedure. Initial ultrasound scanning demonstrates a moderate amount of ascites within the right lower abdominal quadrant. The right lower abdomen was prepped and draped in the usual sterile fashion. 1% lidocaine was used for local anesthesia. Following this, a 19 gauge, 10-cm, Yueh catheter was introduced. An ultrasound image was saved for documentation purposes. The paracentesis was performed. The catheter was removed and a dressing was applied. The patient tolerated the procedure well without immediate post procedural complication. Patient currently receiving IV albumin. FINDINGS: A total of approximately 3.3 liters of hazy,dark amber fluid was removed. Samples were sent to the laboratory as requested by the clinical team. IMPRESSION: Successful ultrasound-guided diagnostic and therapeutic paracentesis yielding 3.3 liters of peritoneal fluid. Read by: Rowe Robert, PA-C Electronically Signed   By: Miachel Roux M.D.   On: 10/07/2020 14:18  DG Chest Port 1 View  Result Date: 10/05/2020 CLINICAL DATA:  Questionable sepsis EXAM: PORTABLE CHEST 1 VIEW COMPARISON:  None. FINDINGS: The patient is rotated to the right. The cardiomediastinal silhouette is within normal limits, allowing for low lung volumes and AP technique. Lung volumes are low. There is no focal consolidation or pulmonary edema. There is no pleural effusion or pneumothorax. There is no acute osseous abnormality. IMPRESSION: Low lung volumes. Otherwise, no radiographic evidence of acute cardiopulmonary process. Electronically Signed   By: Valetta Mole M.D.   On: 10/05/2020 12:26   DG Abd 2 Views  Result Date: 10/12/2020 CLINICAL DATA:  Ileus.  Abdominal pain.  Distension. EXAM: ABDOMEN - 2 VIEW COMPARISON:  Two-view abdomen 10/10/2020 FINDINGS: Slightly dilated small bowel again noted in the left abdomen without significant change. Gas is present in the colon. No definite free air present on supine images. Asymmetric left basilar airspace disease is present. IMPRESSION: 1. Stable ileus pattern. 2. Asymmetric left basilar airspace disease. Pneumonia is not excluded. Electronically Signed   By: San Morelle M.D.   On: 10/12/2020 11:30   DG Abd 2 Views  Result Date: 10/10/2020 CLINICAL DATA:  Vomiting and generalized abdominal pain. EXAM: ABDOMEN - 2 VIEW COMPARISON:  10/09/2020 FINDINGS: There is a mildly prominent loop of small bowel in the LEFT mid abdomen, similar in appearance to prior study. Findings are consistent with focal ileus or early obstruction. Gas and stool are seen throughout the colon. Degenerative changes are noted in the spine. IMPRESSION: Persistent mild dilatation of small bowel loop in the LEFT mid abdomen. Electronically Signed   By: Nolon Nations M.D.   On: 10/10/2020 12:10   DG Abd Portable 1V  Result Date: 10/09/2020 CLINICAL DATA:  Intermittent abdominal pain with distention. EXAM: PORTABLE ABDOMEN - 1 VIEW COMPARISON:  None. FINDINGS:  Single mildly dilated small bowel loop in the left abdomen. Air and stool in the rectum. No radio-opaque calculi or other significant radiographic abnormality are seen. No acute osseous abnormality. IMPRESSION: 1. Single mildly dilated small bowel loop in the left abdomen, favor focal ileus. Electronically Signed   By: Titus Dubin M.D.   On: 10/09/2020 11:30   ECHOCARDIOGRAM COMPLETE  Result Date: 10/06/2020    ECHOCARDIOGRAM REPORT   Patient Name:   Beth Sanchez Date of Exam: 10/06/2020 Medical Rec #:  [id number]      Height:       67.0 in Accession #:    [id number]     Weight:       193.3 lb Date of Birth:  07/20/1955      BSA:          1.993 m Patient Age:    65 years       BP:           94/71 mmHg Patient Gender: F              HR:           100 bpm. Exam Location:  Inpatient Procedure: 2D Echo, Cardiac Doppler and Color Doppler                       STAT ECHO Reported to: Dr Jenkins Rouge on 10/06/2020 1:26:00 PM. Indications:    Ascites [id number]  History:        Patient has no prior history of Echocardiogram examinations.                 Risk  Factors:Hypertension. COVID-19 Positive.  Sonographer:    Tiffany Dance RVT Referring Phys: [id number] [place]  1. Ascites noted on sub costal images Would not appear to be cardiac related.  2. Left ventricular ejection fraction, by estimation, is 60 to 65%. The left ventricle has normal function. The left ventricle has no regional wall motion abnormalities. Left ventricular diastolic parameters are consistent with Grade I diastolic dysfunction (impaired relaxation).  3. Right ventricular systolic function is normal. The right ventricular size is normal. There is normal pulmonary artery systolic pressure.  4. Left atrial size was mildly dilated.  5. The mitral valve is abnormal. No evidence of mitral valve regurgitation. No evidence of mitral stenosis.  6. The aortic valve is tricuspid. There is mild calcification of the aortic valve. Aortic  valve regurgitation is not visualized. Mild aortic valve sclerosis is present, with no evidence of aortic valve stenosis.  7. The inferior vena cava is normal in size with greater than 50% respiratory variability, suggesting right atrial pressure of 3 mmHg. FINDINGS  Left Ventricle: Left ventricular ejection fraction, by estimation, is 60 to 65%. The left ventricle has normal function. The left ventricle has no regional wall motion abnormalities. The left ventricular internal cavity size was normal in size. There is  no left ventricular hypertrophy. Left ventricular diastolic parameters are consistent with Grade I diastolic dysfunction (impaired relaxation). Right Ventricle: The right ventricular size is normal. No increase in right ventricular wall thickness. Right ventricular systolic function is normal. There is normal pulmonary artery systolic pressure. The tricuspid regurgitant velocity is 2.02 m/s, and  with an assumed right atrial pressure of 3 mmHg, the estimated right ventricular systolic pressure is 123456 mmHg. Left Atrium: Left atrial size was mildly dilated. Right Atrium: Right atrial size was normal in size. Pericardium: There is no evidence of pericardial effusion. Mitral Valve: The mitral valve is abnormal. There is mild thickening of the mitral valve leaflet(s). There is mild calcification of the mitral valve leaflet(s). Mild mitral annular calcification. No evidence of mitral valve regurgitation. No evidence of mitral valve stenosis. Tricuspid Valve: The tricuspid valve is normal in structure. Tricuspid valve regurgitation is mild . No evidence of tricuspid stenosis. Aortic Valve: The aortic valve is tricuspid. There is mild calcification of the aortic valve. Aortic valve regurgitation is not visualized. Mild aortic valve sclerosis is present, with no evidence of aortic valve stenosis. Pulmonic Valve: The pulmonic valve was normal in structure. Pulmonic valve regurgitation is not visualized. No  evidence of pulmonic stenosis. Aorta: The aortic root is normal in size and structure. Venous: The inferior vena cava is normal in size with greater than 50% respiratory variability, suggesting right atrial pressure of 3 mmHg. IAS/Shunts: No atrial level shunt detected by color flow Doppler. Additional Comments: Ascites noted on sub costal images Would not appear to be cardiac related.  LEFT VENTRICLE PLAX 2D LVIDd:         3.70 cm  Diastology LVIDs:         2.30 cm  LV e' medial:    7.40 cm/s LV PW:         1.10 cm  LV E/e' medial:  9.8 LV IVS:        1.00 cm  LV e' lateral:   10.40 cm/s LVOT diam:     2.10 cm  LV E/e' lateral: 6.9 LV SV:         63 LV SV Index:   31 LVOT Area:  3.46 cm  RIGHT VENTRICLE             IVC RV Basal diam:  2.30 cm     IVC diam: 1.80 cm RV S prime:     12.10 cm/s TAPSE (M-mode): 2.0 cm LEFT ATRIUM             Index       RIGHT ATRIUM          Index LA diam:        3.80 cm 1.91 cm/m  RA Area:     8.79 cm LA Vol (A2C):   27.3 ml 13.70 ml/m RA Volume:   15.70 ml 7.88 ml/m LA Vol (A4C):   39.1 ml 19.62 ml/m LA Biplane Vol: 33.2 ml 16.66 ml/m  AORTIC VALVE LVOT Vmax:   130.00 cm/s LVOT Vmean:  81.300 cm/s LVOT VTI:    0.180 m  AORTA Ao Root diam: 3.20 cm Ao Asc diam:  3.30 cm MITRAL VALVE                TRICUSPID VALVE MV Area (PHT): 4.31 cm     TR Peak grad:   16.3 mmHg MV Decel Time: 176 msec     TR Vmax:        202.00 cm/s MV E velocity: 72.20 cm/s MV A velocity: 122.00 cm/s  SHUNTS MV E/A ratio:  0.59         Systemic VTI:  0.18 m                             Systemic Diam: 2.10 cm Jenkins Rouge MD Electronically signed by Jenkins Rouge MD Signature Date/Time: 10/06/2020/1:35:19 PM    Final    US Abdomen Limited RUQ (LIVER/GB)  Result Date: 10/05/2020 CLINICAL DATA:  Right upper quadrant abdominal pain EXAM: ULTRASOUND ABDOMEN LIMITED RIGHT UPPER QUADRANT COMPARISON:  10/05/2020 CT abdomen/pelvis FINDINGS: Gallbladder: Nondistended gallbladder is completely filled with a  densely shadowing 3.6 cm gallstone. No gallbladder wall thickening. No sonographic Murphy's sign. Common bile duct: Diameter: 6 mm on limited views, top-normal Liver: Coarsened liver parenchyma with questionable liver surface irregularity, which may indicate cirrhosis. No liver mass demonstrated on these limited views. Portal vein is patent on color Doppler imaging with normal direction of blood flow towards the liver. Other: Large volume ascites in the upper abdomen. IMPRESSION: 1. Cholelithiasis.  No evidence of acute cholecystitis. 2. Top normal caliber common bile duct, 6 mm diameter. 3. Coarsened liver parenchyma with questionable liver surface irregularity, which may indicate cirrhosis. No liver mass detected on these limited views. Suggest correlation with liver function tests. 4. Large volume upper abdominal ascites. Electronically Signed   By: Ilona Sorrel M.D.   On: 10/05/2020 14:37    ASSESSMENT AND PLAN: 1.  Peritoneal carcinoma 2.  Iron deficiency anemia 3.  Mild leukocytosis, resolved 4.  AKI 5.  Protein calorie malnutrition/weight loss 6.  COVID-19 infection, asymptomatic 7.  Diabetes mellitus 8.  Hypertension  -She is awaiting Port-A-Cath placement today.  Hopefully IR can fit her into their schedule today.  If not, we will need to place PICC line.  Plan is to administer carboplatin, paclitaxel, and bevacizumab 10/14/2020.  Hopefully this will help with her recurrent ascites. -She is aware that this regimen is given every 3 weeks.  Today, she is worried about a planned trip that her family has scheduled in mid October.  I assured her that we can  work her chemo schedule around her planned trip. -Labs today appear stable. No need for transfusion.    LOS: 8 days   Mikey Bussing, DNP, AGPCNP-BC, AOCNP 10/13/20

## 2020-10-13 NOTE — Progress Notes (Signed)
PROGRESS NOTE    Beth Sanchez  [id number] DOB: [birth year] DOA: 10/05/2020 PCP: Pcp, No    Chief Complaint  Patient presents with   Abdominal Pain    Vomiting, diarrhea    Brief Narrative:  65 year old F with PMH of HTN, IDDM-2 and obesity presenting with increasing abdominal pain, distention, lower back pain, emesis and fatigue, and found to have liver cirrhosis with ascites, AKI, hypotension, mild hyponatremia, and COVID-19 infection.  She had IR perform paracentesis with removal of 3.5 L.  Had leukocytosis to 15.2.  Started on IV ceftriaxone until SBP is excluded.  Cr 3.34.  She had no respiratory symptoms in regards to a COVID-19 infection.  GI consulted.   Patient never had colonoscopy.  No history of GI bleed.  Quit drinking about 30 years ago.  Work-up done concerning for peritoneal carcinomatosis with elevated CA125 and cytology consistent with malignant cells from paracentesis. -Oncology consulted and following. -Patient status post paracentesis x3. -Port-A-Cath ordered to be placed 10/13/2020 in anticipation of initiation of chemotherapy 10/14/2020.     Assessment & Plan:   Principal Problem:   Ascites Active Problems:   Essential hypertension   Hyperlipidemia associated with type 2 diabetes mellitus (HCC)   Type 2 diabetes mellitus with hyperglycemia (HCC)   Cirrhosis of liver (HCC)   Cirrhosis (HCC)   Iron deficiency anemia   AKI (acute kidney injury) ([place])   Hyperkalemia   Hyponatremia   Gastritis   Primary peritoneal carcinomatosis (HCC)   Goals of care, counseling/discussion  1 large malignant ascites concern for peritoneal carcinomatosis likely of GYN etiology. -Patient presenting with abdominal distention, CT abdomen and pelvis as well as abdominal ultrasound consistent with large volume ascites. -Status post paracentesis with 3.5 L of fluid removed initially on presentation. -Patient with aSAAG < 1.1 arguing against portal hypertension. -Body fluid  cultures with no growth to date with low probability for SBP -Leukocytosis resolved. -No significant exquisite tenderness palpation in the abdominal region -Peritoneal fluid LDH elevated. -Patient had reaccumulation of abdominal ascites and subsequently underwent repeat ultrasound-guided paracentesis 10/07/2020 with 3.3 L of fluid removed. -Patient underwent repeat paracentesis 10/12/2020 with 5.3 L of fluid removed. -Abdomen distended again today.  Will order repeat paracentesis likely to be done tomorrow 10/14/2020 -Cytology consistent with malignant cells. -CT chest with tiny pulmonary nodules and no significant mass or malignancy noted.   -CA 125 elevated at 7281.   -CA 19-9 at 7, CEA at 2.2, AFP at 1.8  -Status post IV albumin.   -Patient was on IV Rocephin which has subsequently been discontinued as SBP highly unlikely -Patient status post upper endoscopy which showed small hiatal hernia, reflux and gastritis.  -Due to elevated CA 125 levels, concern for malignant ascites likely from ovarian source oncology consulted. -Colonoscopy attempted however due to poor bowel prep was canceled.  -Per GI oncology would like to get colonoscopy if possible and as such NG tube ordered by GI to prep and perform colonoscopy, however patient refused NG tube placement and unable to tolerate prep. -Colonoscopy subsequently canceled during this hospitalization per GI and may be considered in the outpatient setting. -Oncology recommended transvaginal ultrasound which showed a uterine fundal mass likely a fibroid, right ovarian minimally complex cystic lesion indeterminate, lack of visualization of left ovary. -Paracentesis as needed. -Patient for Port-A-Cath placement today per IR. -Oncology recommending initiation of chemotherapy as early as Wednesday, 10/14/2020. -GI was following but have signed off. -Oncology following and appreciate input and recommendations.  2.  Acute  kidney injury -Likely secondary to  prerenal azotemia from hypotension in the setting of Zestoretic, Jardiance. -FeNA of 0.9% suggesting a prerenal etiology. -Urinalysis with trace leukocytes, nitrite negative, negative protein arguing against nephrotic syndrome. -Zestoretic on hold. -Urine output not properly recorded. -Renal function was fluctuating and trending back down with hydration with creatinine currently at 1.24 from 1.38 from 1.23 from 1.73 from 1.85 from 1.23. -Patient unable to tolerate prep and refused NG-tube placement. -Avoid nephrotoxic agents.   -Gentle hydration -Follow.  3.  Hypotension -In the setting of cirrhosis with ascites and dehydration from GI loss. -BP improved however soft/borderline.   -Continue to hold antihypertensive medications.  -Patient pancultured with blood cultures pending with no growth to date. -Peritoneal fluid cultures negative to date. -Urine cultures with 10,000 colonies of Staphylococcus stimulants likely of no significance. -Blood cultures negative.   -Peritoneal cultures negative.  -Status post IV albumin. -Place on gentle hydration. -Follow.    4.  Hyperkalemia -Received a dose of Lokelma early on in the hospitalization with improvement with hyperkalemia initially and then subsequently developing hyperkalemia again with a potassium of 5.5 and received another dose of Lokelma.   -Potassium of 4.7.   -Follow.   5.  Hyponatremia -Likely secondary to hypervolemia due to concerns for cirrhosis and abdominal ascites in the setting of Zestoretic. -Zestoretic on hold. -Sodium levels at 137.    6.  Non-anion gap metabolic acidosis likely from AKI -Increase bicarb tablets to 1300 mg twice daily.   7.  Incidental COVID infection -Patient noted to have tested positive on 10/05/2020. -Patient noted to be vaccinated. -Patient with oxygen sats of 97% on room air.   -No indication for treatment.   -Continue isolation precautions for total of 10 days which will be completed on  10/16/2020.   8.  Non-insulin-dependent diabetes mellitus type 2 -Noted to be on Jardiance and metformin prior to admission which are currently on hold in the setting of AKI. -Hemoglobin A1c 5.9 (10/07/2020) -CBG 70 on labs.  -Continue to hold oral hypoglycemic agents.   -SSI discontinued.   -Follow.    9.  Iron deficiency anemia -Anemia panel with iron level of 19, ferritin of 511. -Hemoglobin currently at 10.1 from 11.7 from 11.9 from 11.3 from 10.9 from 9.7 from 11.6 on admission. -Patient status post EGD 10/08/2020 which showed small hiatal hernia, reflux and gastritis.   -Colonoscopy canceled due to poor bowel prep.   -Status post IV iron 10/08/2020.   -Transfusion threshold hemoglobin < 7.  -Patient unable to tolerate bowel prep and as such colonoscopy canceled. -GI was following but have signed off.  10.  Elevated D-dimer -Felt likely secondary to COVID-19 infection. -Continue DVT prophylaxis.    11.  Elevated CA 125/concern for peritoneal carcinomatosis likely ovarian primary -Patient with elevated levels of CA125 of 7281. -Cytology from paracentesis within malignant cells.   -Concern for ovarian malignancy. 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 -Oncology recommended transvaginal ultrasound which was done and showed a uterine fundal mass likely a fibroid, right ovarian minimally complex cystic lesion indeterminate, lack of visualization of left ovary.. -Per oncology patient likely a candidate for systemic chemotherapy. -Port-A-Cath ordered per oncology and and to be placed later on today.  -Oncology recommended initiation of chemotherapy as early as Wednesday, 10/14/2020.  -Per oncology.  12.?  Ileus -Patient noted to have emesis the evening of 10/09/2020.  Patient also with bouts of emesis again last night and  this morning. -Patient with abdominal distention and hypoactive bowel sounds today..   -Abdominal films  obtained concerning for ileus versus obstruction early on in the hospitalization. -Patient noted to have had a bowel movement after suppository yesterday however no bowel movement today and no flatus. -Downgrade diet to n.p.o./bowel rest except ice chips. -Mobilize. -Patient noted to have refused NG tube early on in the hospitalization.  . DVT prophylaxis: Heparin Code Status: Full Family Communication: Updated patient.  No family at bedside. Disposition:   Status is: Inpatient  Remains inpatient appropriate because:Inpatient level of care appropriate due to severity of illness  Dispo: The patient is from: Home              Anticipated d/c is to: Home              Patient currently is not medically stable to d/c.   Difficult to place patient No       Consultants:  Gastroenterology: Dr. Lyndel Safe 10/06/2020 Oncology: Dr. Marin Olp 10/09/2020  Procedures:  Renal ultrasound 10/05/2020 Right upper quadrant abdominal ultrasound 10/05/2020 Chest x-ray 10/05/2020 CT abdomen and pelvis 10/05/2020 2D echo 10/06/2020 Ultrasound-guided paracentesis -3.3 L of hazy dark amber fluid removed per IR, Darrell Allred, PA 10/07/2020 Ultrasound-guided paracentesis 10/05/2020 per Darrell Allred, PA IR with 3.5 L of hazy fluid removed. Upper endoscopy 10/08/2020 CT chest 10/07/2020 Ultrasound-guided paracentesis--5.3 L of clear yellow fluid removed 10/12/2020 per IR, Ascencion Dike, PA Port-A-Cath placement pending 10/13/2020   Antimicrobials: IV Rocephin 10/05/2020>>>>>> 10/08/2020 IV Flagyl 10/05/2020 x 1 dose   Subjective: Laying in bed.  Patient noted to have some emesis overnight and some emesis this morning.  States abdomen is distended.  Denies any significant abdominal pain.  No flatus.  No bowel movement.  Awaiting Port-A-Cath placement today.  Abdomen distended again.   Patient refused was not given any medications yesterday and led to her nausea and vomiting.  Objective: Vitals:   10/12/20 1409  10/12/20 2255 10/13/20 0505 10/13/20 1320  BP: (!) '81/52 96/69 92/70 '$ 92/62  Pulse: 94 93 93 100  Resp:  20 16 (!) 28  Temp: 97.7 F (36.5 C) 97.8 F (36.6 C) 97.7 F (36.5 C) 97.9 F (36.6 C)  TempSrc: Oral Oral Oral Oral  SpO2: 96% 100% 99% 97%  Weight:      Height:        Intake/Output Summary (Last 24 hours) at 10/13/2020 1404 Last data filed at 10/13/2020 1323 Gross per 24 hour  Intake 282.55 ml  Output 550 ml  Net -267.45 ml    Filed Weights   10/06/20 0000 10/08/20 1351  Weight: 87.7 kg 87.7 kg    Examination:  General exam: : NAD Respiratory system: CTA B.  No wheezes, no rhonchi.  Speaking in full sentences.  Normal respiratory effort. Cardiovascular system: Regular rate and rhythm no murmurs rubs or gallops.  No JVD.  No lower extremity edema.  Gastrointestinal system: Abdomen soft, distended, hypoactive bowel sounds, no significant tenderness to palpation.  No rebound.  No guarding.  Central nervous system: Alert and oriented. No focal neurological deficits. Extremities: Symmetric 5 x 5 power. Skin: No rashes, lesions or ulcers Psychiatry: Judgement and insight appear normal. Mood & affect appropriate.  Data Reviewed: I have personally reviewed following labs and imaging studies  CBC: Recent Labs  Lab 10/08/20 0506 10/09/20 0401 10/10/20 0416 10/11/20 0842 10/12/20 0349 10/13/20 0715  WBC 5.1 12.4* 9.4 8.6 7.5 6.3  NEUTROABS 4.3  --  8.6*  --  6.8 5.7  HGB 10.9* 11.3* 10.8* 11.9* 11.7* 10.1*  HCT 34.4* 36.6 34.6* 40.0 38.4 32.2*  MCV 84.3 84.5 85.6 90.7 85.7 85.6  PLT 237 286 258 176 286 198     Basic Metabolic Panel: Recent Labs  Lab 10/07/20 0407 10/08/20 0506 10/09/20 0401 10/09/20 0915 10/10/20 0416 10/11/20 0842 10/12/20 0349 10/13/20 0715  NA 136  136 136 141  --  137 139 137 137  K 4.6  4.6 4.6 5.3* 5.5* 5.0 5.0 4.9 4.7  CL 105  106 107 108  --  106 112* 108 108  CO2 21*  20* 20* 17*  --  20* 16* 19* 17*  GLUCOSE 92  94 81  112*  --  89 94 92 70  BUN 65*  65* 49* 52*  --  52* 39* 40* 36*  CREATININE 1.55*  1.61* 1.23* 1.85*  --  1.73* 1.23* 1.38* 1.24*  CALCIUM 9.3  9.4 9.5 9.8  --  9.5 9.5 9.7 9.8  MG 2.1 1.9 2.0  --  2.0 1.9 2.0  --   PHOS 3.1  --   --   --  4.0 2.9  --  3.4     GFR: Estimated Creatinine Clearance: 51.4 mL/min (A) (by C-G formula based on SCr of 1.24 mg/dL (H)).  Liver Function Tests: Recent Labs  Lab 10/07/20 0407 10/08/20 0506 10/10/20 0416 10/11/20 0842 10/13/20 0715  AST 13* 14*  --   --   --   ALT 9 9  --   --   --   ALKPHOS 75 73  --   --   --   BILITOT 0.8 1.0  --   --   --   PROT 6.6 6.7  --   --   --   ALBUMIN 3.8  3.8 3.9 3.2* 3.2* 3.9     CBG: Recent Labs  Lab 10/11/20 2135 10/12/20 0742 10/12/20 0839 10/12/20 1404 10/12/20 1722  GLUCAP 93 78 88 86 81      Recent Results (from the past 240 hour(s))  Blood Culture (routine x 2)     Status: None   Collection Time: 10/05/20 11:36 AM   Specimen: BLOOD  Result Value Ref Range Status   Specimen Description   Final    BLOOD LEFT ANTECUBITAL Performed at [facility], [place] [address], [place], [place] [address]    Special Requests   Final    BOTTLES DRAWN AEROBIC AND ANAEROBIC Blood Culture results may not be optimal due to an inadequate volume of blood received in culture bottles Performed at [place] [address], [place], [place] [address]    Culture   Final    NO GROWTH 5 DAYS Performed at [facility], [place] [address] [place], [place] [address]    Report Status 10/10/2020 FINAL  Final  Resp Panel by RT-PCR (Flu A&B, Covid) Nasopharyngeal Swab     Status: Abnormal   Collection Time: 10/05/20 12:00 PM   Specimen: Nasopharyngeal Swab; Nasopharyngeal(NP) swabs in vial transport medium  Result Value Ref Range Status   SARS Coronavirus 2 by RT PCR POSITIVE (A) NEGATIVE Final    Comment: RESULT CALLED TO, READ BACK BY AND VERIFIED WITH: Halina Maidens,  RN 10/05/20 1446 KDS (NOTE) SARS-CoV-2 target nucleic acids are DETECTED.  The SARS-CoV-2 RNA is generally detectable in upper respiratory specimens during the acute phase of infection. Positive results are indicative of the presence of the identified virus,  but do not rule out bacterial infection or co-infection with other pathogens not detected by the test. Clinical correlation with patient history and other diagnostic information is necessary to determine patient infection status. The expected result is Negative.  Fact Sheet for Patients: EntrepreneurPulse.com.au  Fact Sheet for Healthcare Providers: IncredibleEmployment.be  This test is not yet approved or cleared by the [place] FDA and  has been authorized for detection and/or diagnosis of SARS-CoV-2 by FDA under an Emergency Use Authorization (EUA).  This EUA will remain in effect (meaning this test can be u sed) for the duration of  the COVID-19 declaration under Section 564(b)(1) of the Act, 21 U.S.C. section 360bbb-3(b)(1), unless the authorization is terminated or revoked sooner.     Influenza A by PCR NEGATIVE NEGATIVE Final   Influenza B by PCR NEGATIVE NEGATIVE Final    Comment: (NOTE) The Xpert Xpress SARS-CoV-2/FLU/RSV plus assay is intended as an aid in the diagnosis of influenza from Nasopharyngeal swab specimens and should not be used as a sole basis for treatment. Nasal washings and aspirates are unacceptable for Xpert Xpress SARS-CoV-2/FLU/RSV testing.  Fact Sheet for Patients: EntrepreneurPulse.com.au  Fact Sheet for Healthcare Providers: IncredibleEmployment.be  This test is not yet approved or cleared by the [place] FDA and has been authorized for detection and/or diagnosis of SARS-CoV-2 by FDA under an Emergency Use Authorization (EUA). This EUA will remain in effect (meaning this test can be used) for the duration of  the COVID-19 declaration under Section 564(b)(1) of the Act, 21 U.S.C. section 360bbb-3(b)(1), unless the authorization is terminated or revoked.  Performed at [facility], [place] [address], [place], [place] [address]   Blood Culture (routine x 2)     Status: None   Collection Time: 10/05/20 12:00 PM   Specimen: BLOOD  Result Value Ref Range Status   Specimen Description   Final    BLOOD RIGHT ANTECUBITAL Performed at [facility] [address], [place], [place] [address]    Special Requests   Final    BOTTLES DRAWN AEROBIC AND ANAEROBIC Blood Culture results may not be optimal due to an inadequate volume of blood received in culture bottles Performed at [place] [address] [place], [place] [address]    Culture   Final    NO GROWTH 5 DAYS Performed at [facility], [place] [address] [place], [place] [address]    Report Status 10/10/2020 FINAL  Final  Body fluid culture w Gram Stain     Status: None   Collection Time: 10/05/20  3:07 PM   Specimen: Peritoneal Cavity; Peritoneal Fluid  Result Value Ref Range Status   Specimen Description   Final    PERITONEAL CAVITY Performed at [place] [address], [place], [place] [address]    Special Requests   Final    NONE Performed at [facility] [place] [place], [place] [address] [place], [place] [address]    Gram Stain   Final    NO SQUAMOUS EPITHELIAL CELLS SEEN FEW WBC SEEN NO ORGANISMS SEEN    Culture   Final    NO GROWTH Performed at [facility], [place] [address], [place],  [address]    Report Status 10/08/2020 FINAL  Final  Culture, body fluid w Gram Stain-bottle     Status: None   Collection Time: 10/05/20  5:02 PM   Specimen: Fluid  Result Value Ref Range Status   Specimen  Description FLUID RIGHT PERITONEAL  Final   Special Requests   Final    BOTTLES DRAWN AEROBIC AND ANAEROBIC Blood Culture adequate  volume   Culture   Final    NO GROWTH 5 DAYS Performed at [facility], [address] [place], [place] [address]    Report Status 10/10/2020 FINAL  Final  Gram stain     Status: None   Collection Time: 10/05/20  5:02 PM   Specimen: Fluid  Result Value Ref Range Status   Specimen Description FLUID RIGHT PERITONEAL  Final   Special Requests NONE  Final   Gram Stain   Final    RARE SQUAMOUS EPITHELIAL CELLS PRESENT RARE WBC SEEN NO ORGANISMS SEEN Performed at [facility], [place] [address], [place], [place] [address]    Report Status 10/06/2020 FINAL  Final  Urine Culture     Status: Abnormal   Collection Time: 10/05/20  9:19 PM   Specimen: In/Out Cath Urine  Result Value Ref Range Status   Specimen Description   Final    IN/OUT CATH URINE Performed at [place] [address], [place], [place] [address]    Special Requests   Final    NONE Performed at [facility], [place] [address], [place], [place] [address]    Culture 10,000 COLONIES/mL STAPHYLOCOCCUS SIMULANS (A)  Final   Report Status 10/08/2020 FINAL  Final   Organism ID, Bacteria STAPHYLOCOCCUS SIMULANS (A)  Final      Susceptibility   Staphylococcus simulans - MIC*    CIPROFLOXACIN <=0.5 SENSITIVE Sensitive     GENTAMICIN <=0.5 SENSITIVE Sensitive     NITROFURANTOIN <=16 SENSITIVE Sensitive     OXACILLIN <=0.25 SENSITIVE Sensitive     TETRACYCLINE <=1 SENSITIVE Sensitive     VANCOMYCIN <=0.5 SENSITIVE Sensitive     TRIMETH/SULFA <=10 SENSITIVE Sensitive     CLINDAMYCIN <=0.25 SENSITIVE Sensitive     RIFAMPIN <=0.5 SENSITIVE Sensitive     Inducible Clindamycin NEGATIVE Sensitive     * 10,000 COLONIES/mL STAPHYLOCOCCUS SIMULANS  Body fluid culture w Gram Stain     Status: None   Collection Time: 10/07/20 12:40 PM   Specimen: Peritoneal Washings  Result Value Ref Range Status   Specimen Description   Final    PERITONEAL Performed at [place] [address], [place], [place] [address]    Special Requests   Final    NONE Performed at [facility], [place] [address] [place], [place] [address]    Gram Stain   Final    RARE WBC PRESENT,BOTH PMN AND MONONUCLEAR NO ORGANISMS SEEN    Culture   Final    NO GROWTH Performed at [facility], [place] [address] [place], [place] [address]    Report Status 10/11/2020 FINAL  Final          Radiology Studies: US Paracentesis  Result Date: 10/12/2020 INDICATION: Abdominal distention secondary to recurrent malignant ascites. Request for diagnostic and therapeutic paracentesis. EXAM: ULTRASOUND GUIDED RIGHT LOWER QUADRANT PARACENTESIS MEDICATIONS: 1% plain lidocaine, 5 mL COMPLICATIONS: None immediate. PROCEDURE: Informed written consent was obtained from the patient after a discussion of the risks, benefits and alternatives to treatment. A timeout was performed prior to the initiation of the procedure. Initial ultrasound scanning demonstrates a large amount of ascites within the right lower abdominal quadrant. The right lower abdomen was prepped and draped in the usual sterile fashion. 1% lidocaine was  used for local anesthesia. Following this, a 19 gauge, 7-cm, Yueh catheter was introduced. An ultrasound image was saved for documentation purposes. The paracentesis was performed. The catheter was removed and a dressing was applied. The patient tolerated the procedure well without immediate post procedural complication. FINDINGS: A total of approximately 5.3 L of clear yellow fluid was removed. Samples were sent to the laboratory as requested by the clinical team. IMPRESSION: Successful ultrasound-guided paracentesis yielding 5.3 liters of peritoneal fluid. Read by: Ascencion Dike PA-C Electronically Signed   By: Corrie Mckusick D.O.   On: 10/12/2020 13:42   DG Abd 2 Views  Result Date: 10/12/2020 CLINICAL DATA:  Ileus.  Abdominal pain.  Distension. EXAM: ABDOMEN -  2 VIEW COMPARISON:  Two-view abdomen 10/10/2020 FINDINGS: Slightly dilated small bowel again noted in the left abdomen without significant change. Gas is present in the colon. No definite free air present on supine images. Asymmetric left basilar airspace disease is present. IMPRESSION: 1. Stable ileus pattern. 2. Asymmetric left basilar airspace disease. Pneumonia is not excluded. Electronically Signed   By: San Morelle M.D.   On: 10/12/2020 11:30        Scheduled Meds:  (feeding supplement) PROSource Plus  30 mL Oral TID BM   feeding supplement  1 Container Oral Q24H   heparin  5,000 Units Subcutaneous Q12H   multivitamin with minerals  1 tablet Oral Daily   pantoprazole (PROTONIX) IV  40 mg Intravenous Q12H   senna-docusate  1 tablet Oral BID   simvastatin  20 mg Oral QHS   sodium bicarbonate  1,300 mg Oral BID   Continuous Infusions:     LOS: 8 days    Time spent: 40 minutes    Irine Seal, MD [facility]   To contact the attending provider between 7A-7P or the covering provider during after hours 7P-7A, please log into the web site [email or web address] and access using universal [place] password for that web site. If you do not have the password, please call the hospital operator.  10/13/2020, 2:04 PM

## 2020-10-13 NOTE — Plan of Care (Signed)
  Problem: Education: Goal: Knowledge of General Education information will improve Description: Including pain rating scale, medication(s)/side effects and non-pharmacologic comfort measures Outcome: Progressing   Problem: Health Behavior/Discharge Planning: Goal: Ability to manage health-related needs will improve Outcome: Progressing   Problem: Clinical Measurements: Goal: Will remain free from infection Outcome: Progressing Goal: Diagnostic test results will improve Outcome: Progressing   Problem: Pain Managment: Goal: General experience of comfort will improve Outcome: Progressing   Problem: Nutrition: Goal: Adequate nutrition will be maintained Outcome: Not Progressing

## 2020-10-13 NOTE — Progress Notes (Signed)
Pt noncompliant with diet order NPO except sips with meds. Pt believes that drinking soda can alleviate her nausea and vomiting. This RN educated pt about importance of diet but however still continue to drink carbonated beverages and gets rude when not given what she wants. NP Olena Heckle made aware. Will continue to monitor

## 2020-10-13 NOTE — Progress Notes (Signed)
0730- Pt lying on left side in nad. Skin with slight yellow hue. Pt is alert and oriented x4. Lungs clear bilaterally. Abdomen large with bowel sounds hypoactive x4 quadrants.   1615- Pt back to room from IR with right port a cath placed. Report received from Dan.   1640- Pt medicated for pain and nausea. Also reports heart burn. Ice chips given per request.

## 2020-10-13 NOTE — Progress Notes (Signed)
Patient seen in the hospital by Dr Marin Olp. Per his request oncomap testing request sent on specimen [id number] DOS 10/07/2020.  Will follow for post discharge follow up.  Oncology Nurse Navigator Documentation  Oncology Nurse Navigator Flowsheets 10/13/2020  Navigator Follow Up Date: 10/16/2020  Navigator Follow Up Reason: Appointment Review  Navigator Location [facility]  Navigator Encounter Type Molecular Studies  Treatment Phase Pre-Tx/Tx Discussion  Barriers/Navigation Needs Coordination of Care  Interventions Coordination of Care  Acuity Level 2-Minimal Needs (1-2 Barriers Identified)  Coordination of Care Pathology  Time Spent with Patient 45

## 2020-10-14 DIAGNOSIS — C569 Malignant neoplasm of unspecified ovary: Secondary | ICD-10-CM | POA: Diagnosis not present

## 2020-10-14 DIAGNOSIS — E1169 Type 2 diabetes mellitus with other specified complication: Secondary | ICD-10-CM | POA: Diagnosis not present

## 2020-10-14 DIAGNOSIS — R180 Malignant ascites: Secondary | ICD-10-CM | POA: Diagnosis not present

## 2020-10-14 DIAGNOSIS — K7460 Unspecified cirrhosis of liver: Secondary | ICD-10-CM | POA: Diagnosis not present

## 2020-10-14 LAB — RENAL FUNCTION PANEL
Albumin: 3.3 g/dL — ABNORMAL LOW (ref 3.5–5.0)
Anion gap: 13 (ref 5–15)
BUN: 32 mg/dL — ABNORMAL HIGH (ref 8–23)
CO2: 18 mmol/L — ABNORMAL LOW (ref 22–32)
Calcium: 9.4 mg/dL (ref 8.9–10.3)
Chloride: 109 mmol/L (ref 98–111)
Creatinine, Ser: 1.27 mg/dL — ABNORMAL HIGH (ref 0.44–1.00)
GFR, Estimated: 47 mL/min — ABNORMAL LOW (ref >60)
Glucose, Bld: 103 mg/dL — ABNORMAL HIGH (ref 70–99)
Phosphorus: 2.8 mg/dL (ref 2.5–4.6)
Potassium: 4.3 mmol/L (ref 3.5–5.1)
Sodium: 140 mmol/L (ref 135–145)

## 2020-10-14 LAB — CBC WITH DIFFERENTIAL/PLATELET
Abs Immature Granulocytes: 0.03 10*3/uL (ref 0.00–0.07)
Basophils Absolute: 0 10*3/uL (ref 0.0–0.1)
Basophils Relative: 0 %
Eosinophils Absolute: 0 10*3/uL (ref 0.0–0.5)
Eosinophils Relative: 0 %
HCT: 34.8 % — ABNORMAL LOW (ref 36.0–46.0)
Hemoglobin: 10.9 g/dL — ABNORMAL LOW (ref 12.0–15.0)
Immature Granulocytes: 0 %
Lymphocytes Relative: 4 %
Lymphs Abs: 0.3 10*3/uL — ABNORMAL LOW (ref 0.7–4.0)
MCH: 26.8 pg (ref 26.0–34.0)
MCHC: 31.3 g/dL (ref 30.0–36.0)
MCV: 85.5 fL (ref 80.0–100.0)
Monocytes Absolute: 0.4 10*3/uL (ref 0.1–1.0)
Monocytes Relative: 6 %
Neutro Abs: 6.6 10*3/uL (ref 1.7–7.7)
Neutrophils Relative %: 90 %
Platelets: 240 10*3/uL (ref 150–400)
RBC: 4.07 MIL/uL (ref 3.87–5.11)
RDW: 17.1 % — ABNORMAL HIGH (ref 11.5–15.5)
WBC: 7.3 10*3/uL (ref 4.0–10.5)
nRBC: 0 % (ref 0.0–0.2)

## 2020-10-14 MED ORDER — FAMOTIDINE IN NACL 20-0.9 MG/50ML-% IV SOLN
20.0000 mg | Freq: Once | INTRAVENOUS | Status: AC
  Administered 2020-10-14: 20 mg via INTRAVENOUS
  Filled 2020-10-14: qty 50

## 2020-10-14 MED ORDER — SODIUM CHLORIDE 0.9 % IV SOLN: Freq: Once | INTRAVENOUS | Status: AC

## 2020-10-14 MED ORDER — SODIUM CHLORIDE 0.9 % IV SOLN
150.0000 mg | Freq: Once | INTRAVENOUS | Status: AC
  Administered 2020-10-14: 150 mg via INTRAVENOUS
  Filled 2020-10-14: qty 5

## 2020-10-14 MED ORDER — BOOST / RESOURCE BREEZE PO LIQD CUSTOM
1.0000 | ORAL | Status: DC
  Administered 2020-10-15: 1 via ORAL

## 2020-10-14 MED ORDER — SODIUM CHLORIDE 0.9 % IV SOLN
430.5000 mg | Freq: Once | INTRAVENOUS | Status: AC
  Administered 2020-10-14: 430 mg via INTRAVENOUS
  Filled 2020-10-14: qty 43

## 2020-10-14 MED ORDER — PALONOSETRON HCL INJECTION 0.25 MG/5ML
0.2500 mg | Freq: Once | INTRAVENOUS | Status: AC
  Administered 2020-10-14: 0.25 mg via INTRAVENOUS
  Filled 2020-10-14: qty 5

## 2020-10-14 MED ORDER — ENSURE SURGERY PO LIQD
237.0000 mL | ORAL | Status: DC
  Filled 2020-10-14 (×8): qty 237

## 2020-10-14 MED ORDER — PROSOURCE PLUS PO LIQD
30.0000 mL | Freq: Two times a day (BID) | ORAL | Status: DC
  Filled 2020-10-14 (×6): qty 30

## 2020-10-14 MED ORDER — SODIUM CHLORIDE 0.9 % IV SOLN
140.0000 mg/m2 | Freq: Once | INTRAVENOUS | Status: DC
  Filled 2020-10-14 (×2): qty 48

## 2020-10-14 MED ORDER — DIPHENHYDRAMINE HCL 50 MG/ML IJ SOLN
50.0000 mg | Freq: Once | INTRAMUSCULAR | Status: AC
  Administered 2020-10-14: 50 mg via INTRAVENOUS
  Filled 2020-10-14 (×2): qty 1

## 2020-10-14 MED ORDER — SODIUM CHLORIDE 0.9 % IV SOLN
10.0000 mg | Freq: Once | INTRAVENOUS | Status: AC
  Administered 2020-10-14: 10 mg via INTRAVENOUS
  Filled 2020-10-14: qty 1

## 2020-10-14 MED ORDER — PROCHLORPERAZINE EDISYLATE 10 MG/2ML IJ SOLN
10.0000 mg | Freq: Four times a day (QID) | INTRAMUSCULAR | Status: DC | PRN
  Administered 2020-10-15 – 2020-10-22 (×19): 10 mg via INTRAVENOUS
  Filled 2020-10-14 (×19): qty 2

## 2020-10-14 MED ORDER — OXYCODONE HCL 5 MG PO TABS
5.0000 mg | ORAL_TABLET | ORAL | Status: DC | PRN
Start: 2020-10-14 — End: 2020-10-15
  Administered 2020-10-14 – 2020-10-15 (×4): 5 mg via ORAL
  Filled 2020-10-14 (×4): qty 1

## 2020-10-14 MED ORDER — COLD PACK MISC ONCOLOGY
1.0000 | Freq: Once | Status: AC | PRN
  Filled 2020-10-14: qty 1

## 2020-10-14 NOTE — Progress Notes (Addendum)
PROGRESS NOTE    Beth Sanchez  [id number] DOB: [birth year] DOA: 10/05/2020 PCP: Pcp, No    Chief Complaint  Patient presents with   Abdominal Pain    Vomiting, diarrhea    Brief Narrative:  65 year old F with PMH of HTN, IDDM-2 and obesity presenting with increasing abdominal pain, distention, lower back pain, emesis and fatigue, and found to have liver cirrhosis with ascites, AKI, hypotension, mild hyponatremia, and COVID-19 infection.  She had IR perform paracentesis with removal of 3.5 L.  Had leukocytosis to 15.2.  Started on IV ceftriaxone until SBP is excluded.  Cr 3.34.  She had no respiratory symptoms in regards to a COVID-19 infection.  GI consulted.   Patient never had colonoscopy.  No history of GI bleed.  Quit drinking about 30 years ago.  Work-up done concerning for peritoneal carcinomatosis with elevated CA125 and cytology consistent with malignant cells from paracentesis. -Oncology consulted and following. -Patient status post paracentesis x3- last one 8/22 -Port-A-Cath ordered to be placed 10/13/2020 in anticipation of initiation of chemotherapy 10/14/2020.     Assessment & Plan:   Principal Problem:   Ascites Active Problems:   Essential hypertension   Hyperlipidemia associated with type 2 diabetes mellitus (HCC)   Type 2 diabetes mellitus with hyperglycemia (HCC)   Cirrhosis of liver (HCC)   Cirrhosis (HCC)   Iron deficiency anemia   AKI (acute kidney injury) ([place])   Hyperkalemia   Hyponatremia   Gastritis   Primary peritoneal carcinomatosis (HCC)   Goals of care, counseling/discussion   large malignant ascites concern for peritoneal carcinomatosis likely of GYN etiology. -Patient presenting with abdominal distention, CT abdomen and pelvis as well as abdominal ultrasound consistent with large volume ascites. -Status post paracentesis with 3.5 L of fluid removed initially on presentation. -Patient with aSAAG < 1.1 arguing against portal  hypertension. -Body fluid cultures with no growth to date with low probability for SBP -Leukocytosis resolved. -No significant exquisite tenderness palpation in the abdominal region -Peritoneal fluid LDH elevated. -Patient had reaccumulation of abdominal ascites and subsequently underwent repeat ultrasound-guided paracentesis 10/07/2020 with 3.3 L of fluid removed. -Patient underwent repeat paracentesis 10/12/2020 with 5.3 L of fluid removed. -Abdomen distended again today.  Will order repeat paracentesis likely to be done tomorrow 10/14/2020 -Cytology consistent with malignant cells. -CT chest with tiny pulmonary nodules and no significant mass or malignancy noted.   -CA 125 elevated at 7281.   -CA 19-9 at 7, CEA at 2.2, AFP at 1.8  -Status post IV albumin.   -Patient was on IV Rocephin which has subsequently been discontinued as SBP highly unlikely -Patient status post upper endoscopy which showed small hiatal hernia, reflux and gastritis.  -Due to elevated CA 125 levels, concern for malignant ascites likely from ovarian source oncology consulted. -Colonoscopy attempted however due to poor bowel prep was canceled.  -Per GI oncology would like to get colonoscopy if possible and as such NG tube ordered by GI to prep and perform colonoscopy, however patient refused NG tube placement and unable to tolerate prep. -Colonoscopy subsequently canceled during this hospitalization per GI and may be considered in the outpatient setting. -Oncology recommended transvaginal ultrasound which showed a uterine fundal mass likely a fibroid, right ovarian minimally complex cystic lesion indeterminate, lack of visualization of left ovary. -Paracentesis as needed- BP low today -Oncology recommending initiation of chemotherapy as early as Wednesday, 10/14/2020.   Acute kidney injury -Likely secondary to prerenal azotemia from hypotension in the setting of Zestoretic, Jardiance. -FeNA  of 0.9% suggesting a prerenal  etiology. -Urinalysis with trace leukocytes, nitrite negative, negative protein arguing against nephrotic syndrome. -Zestoretic on hold. -Renal function was fluctuating and trending back down with hydration with creatinine currently at 1.24 from 1.38 from 1.23 from 1.73 from 1.85 from 1.23. -Patient unable to tolerate prep and refused NG-tube placement. -Avoid nephrotoxic agents.   -Gentle hydration  Hypotension -In the setting of cirrhosis with ascites and dehydration from GI loss. -BP improved however soft/borderline.   -Continue to hold antihypertensive medications.  -Patient pancultured with blood cultures pending with no growth to date. -Peritoneal fluid cultures negative to date. -Urine cultures with 10,000 colonies of Staphylococcus stimulants likely of no significance. -Blood cultures negative.   -Peritoneal cultures negative.  -Status post IV albumin.  Hyperkalemia -Received a dose of Lokelma early on in the hospitalization with improvement with hyperkalemia initially and then subsequently developing hyperkalemia again with a potassium of 5.5 and received another dose of Lokelma.   -Potassium of 4.7.   -Follow.   Hyponatremia -Likely secondary to hypervolemia due to concerns for cirrhosis and abdominal ascites in the setting of Zestoretic. -Zestoretic on hold.  Non-anion gap metabolic acidosis likely from AKI -Increase bicarb tablets to 1300 mg twice daily.    Incidental COVID infection -Patient noted to have tested positive on 10/05/2020. -Patient noted to be vaccinated. -Patient with oxygen sats of 97% on room air.   -No indication for treatment.   -Continue isolation precautions for total of 10 days which will be completed on 10/16/2020.   Non-insulin-dependent diabetes mellitus type 2 -Noted to be on Jardiance and metformin prior to admission which are currently on hold in the setting of AKI. -Hemoglobin A1c 5.9 (10/07/2020)  Iron deficiency anemia -Anemia panel  with iron level of 19, ferritin of 511. -Hemoglobin currently at 10.1 from 11.7 from 11.9 from 11.3 from 10.9 from 9.7 from 11.6 on admission. -Patient status post EGD 10/08/2020 which showed small hiatal hernia, reflux and gastritis.   -Colonoscopy canceled due to poor bowel prep.   -Status post IV iron 10/08/2020.   -Transfusion threshold hemoglobin < 7.  -GI was following but have signed off.   Elevated D-dimer -Felt likely secondary to COVID-19 infection. -Continue DVT prophylaxis.    Elevated CA 125/concern for peritoneal carcinomatosis likely ovarian primary -Patient with elevated levels of CA125 of 7281. -Cytology from paracentesis within malignant cells.   -Concern for ovarian malignancy. 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 -Oncology recommended transvaginal ultrasound which was done and showed a uterine fundal mass likely a fibroid, right ovarian minimally complex cystic lesion indeterminate, lack of visualization of left ovary.. -Per oncology patient likely a candidate for systemic chemotherapy. -initiation of chemotherapy as early as Wednesday, 10/14/2020.  -Per oncology.  ?  Ileus -Patient with abdominal distention and hypoactive bowel sounds today..   -Abdominal films obtained concerning for ileus versus obstruction early on in the hospitalization. -Mobilize. -Patient noted to have refused NG tube early on in the hospitalization.  Nutrition Status: mild malnutrition Nutrition Problem: Increased nutrient needs Etiology: acute illness, catabolic illness ([id number] infection) Signs/Symptoms: estimated needs Interventions: Boost Breeze, Ensure Surgery, MVI, Prostat   . DVT prophylaxis: Heparin Code Status: Full Family Communication: Updated patient.  No family at bedside. Disposition:   Status is: Inpatient  Remains inpatient appropriate because:Inpatient level of care appropriate due to severity of  illness  Dispo: The patient is from: Home  Anticipated d/c is to: Home              Patient currently is not medically stable to d/c.   Difficult to place patient No       Consultants:  Gastroenterology: Dr. Lyndel Safe 10/06/2020 Oncology: Dr. Marin Olp 10/09/2020  Procedures:  Renal ultrasound 10/05/2020 Right upper quadrant abdominal ultrasound 10/05/2020 Chest x-ray 10/05/2020 CT abdomen and pelvis 10/05/2020 2D echo 10/06/2020 Ultrasound-guided paracentesis -3.3 L of hazy dark amber fluid removed per IR, Darrell Allred, PA 10/07/2020 Ultrasound-guided paracentesis 10/05/2020 per Darrell Allred, PA IR with 3.5 L of hazy fluid removed. Upper endoscopy 10/08/2020 CT chest 10/07/2020 Ultrasound-guided paracentesis--5.3 L of clear yellow fluid removed 10/12/2020 per IR, Ascencion Dike, PA Port-A-Cath placement pending 10/13/2020   Antimicrobials: IV Rocephin 10/05/2020>>>>>> 10/08/2020 IV Flagyl 10/05/2020 x 1 dose   Subjective: Asking for icecream   Objective: Vitals:   10/13/20 1600 10/13/20 2049 10/14/20 0532 10/14/20 1246  BP: 95/67 (!) 89/52 102/68 (!) 85/63  Pulse: 97 88 95 93  Resp: '18 18 16 16  '$ Temp:   97.9 F (36.6 C)   TempSrc:   Oral   SpO2: 100%  97% 95%  Weight:      Height:        Intake/Output Summary (Last 24 hours) at 10/14/2020 1444 Last data filed at 10/14/2020 0600 Gross per 24 hour  Intake 553.89 ml  Output --  Net 553.89 ml   Filed Weights   10/06/20 0000 10/08/20 1351  Weight: 87.7 kg 87.7 kg    Examination:   General: Appearance:    Obese female in no acute distress   Sluggish bowel sounds, + fluid wave  Lungs:     respirations unlabored  Heart:    Normal heart rate.    MS:   All extremities are intact.    Neurologic:   Awake, alert     Data Reviewed: I have personally reviewed following labs and imaging studies  CBC: Recent Labs  Lab 10/08/20 0506 10/09/20 0401 10/10/20 0416 10/11/20 0842 10/12/20 0349 10/13/20 0715  10/14/20 0745  WBC 5.1   < > 9.4 8.6 7.5 6.3 7.3  NEUTROABS 4.3  --  8.6*  --  6.8 5.7 6.6  HGB 10.9*   < > 10.8* 11.9* 11.7* 10.1* 10.9*  HCT 34.4*   < > 34.6* 40.0 38.4 32.2* 34.8*  MCV 84.3   < > 85.6 90.7 85.7 85.6 85.5  PLT 237   < > 258 176 286 198 240   < > = values in this interval not displayed.    Basic Metabolic Panel: Recent Labs  Lab 10/08/20 0506 10/09/20 0401 10/09/20 0915 10/10/20 0416 10/11/20 0842 10/12/20 0349 10/13/20 0715 10/14/20 0745  NA 136 141  --  137 139 137 137 140  K 4.6 5.3*   < > 5.0 5.0 4.9 4.7 4.3  CL 107 108  --  106 112* 108 108 109  CO2 20* 17*  --  20* 16* 19* 17* 18*  GLUCOSE 81 112*  --  89 94 92 70 103*  BUN 49* 52*  --  52* 39* 40* 36* 32*  CREATININE 1.23* 1.85*  --  1.73* 1.23* 1.38* 1.24* 1.27*  CALCIUM 9.5 9.8  --  9.5 9.5 9.7 9.8 9.4  MG 1.9 2.0  --  2.0 1.9 2.0  --   --   PHOS  --   --   --  4.0 2.9  --  3.4 2.8   < > =  values in this interval not displayed.    GFR: Estimated Creatinine Clearance: 50.2 mL/min (A) (by C-G formula based on SCr of 1.27 mg/dL (H)).  Liver Function Tests: Recent Labs  Lab 10/08/20 0506 10/10/20 0416 10/11/20 0842 10/13/20 0715 10/14/20 0745  AST 14*  --   --   --   --   ALT 9  --   --   --   --   ALKPHOS 73  --   --   --   --   BILITOT 1.0  --   --   --   --   PROT 6.7  --   --   --   --   ALBUMIN 3.9 3.2* 3.2* 3.9 3.3*    CBG: Recent Labs  Lab 10/11/20 2135 10/12/20 0742 10/12/20 0839 10/12/20 1404 10/12/20 1722  GLUCAP 93 78 88 86 81     Recent Results (from the past 240 hour(s))  Blood Culture (routine x 2)     Status: None   Collection Time: 10/05/20 11:36 AM   Specimen: BLOOD  Result Value Ref Range Status   Specimen Description   Final    BLOOD LEFT ANTECUBITAL Performed at [facility], [place] [address] [place], [place] [address]    Special Requests   Final    BOTTLES DRAWN AEROBIC AND ANAEROBIC Blood Culture results may not be optimal due to  an inadequate volume of blood received in culture bottles Performed at [facility] [address] [place], [place] [address]    Culture   Final    NO GROWTH 5 DAYS Performed at [facility], [place] [address], [place], [place] [address]    Report Status 10/10/2020 FINAL  Final  Resp Panel by RT-PCR (Flu A&B, Covid) Nasopharyngeal Swab     Status: Abnormal   Collection Time: 10/05/20 12:00 PM   Specimen: Nasopharyngeal Swab; Nasopharyngeal(NP) swabs in vial transport medium  Result Value Ref Range Status   SARS Coronavirus 2 by RT PCR POSITIVE (A) NEGATIVE Final    Comment: RESULT CALLED TO, READ BACK BY AND VERIFIED WITH: Halina Maidens, RN 10/05/20 1446 KDS (NOTE) SARS-CoV-2 target nucleic acids are DETECTED.  The SARS-CoV-2 RNA is generally detectable in upper respiratory specimens during the acute phase of infection. Positive results are indicative of the presence of the identified virus, but do not rule out bacterial infection or co-infection with other pathogens not detected by the test. Clinical correlation with patient history and other diagnostic information is necessary to determine patient infection status. The expected result is Negative.  Fact Sheet for Patients: EntrepreneurPulse.com.au  Fact Sheet for Healthcare Providers: IncredibleEmployment.be  This test is not yet approved or cleared by the [place] FDA and  has been authorized for detection and/or diagnosis of SARS-CoV-2 by FDA under an Emergency Use Authorization (EUA).  This EUA will remain in effect (meaning this test can be u sed) for the duration of  the COVID-19 declaration under Section 564(b)(1) of the Act, 21 U.S.C. section 360bbb-3(b)(1), unless the authorization is terminated or revoked sooner.     Influenza A by PCR NEGATIVE NEGATIVE Final   Influenza B by PCR NEGATIVE NEGATIVE Final    Comment: (NOTE) The Xpert Xpress  SARS-CoV-2/FLU/RSV plus assay is intended as an aid in the diagnosis of influenza from Nasopharyngeal swab specimens and should not be used as a sole basis for treatment. Nasal washings and aspirates are unacceptable for Xpert Xpress SARS-CoV-2/FLU/RSV testing.  Fact Sheet for Patients: EntrepreneurPulse.com.au  Fact Sheet for Healthcare Providers: IncredibleEmployment.be  This test is not yet approved or cleared by the [place] FDA and has been authorized for detection and/or diagnosis of SARS-CoV-2 by FDA under an Emergency Use Authorization (EUA). This EUA will remain in effect (meaning this test can be used) for the duration of the COVID-19 declaration under Section 564(b)(1) of the Act, 21 U.S.C. section 360bbb-3(b)(1), unless the authorization is terminated or revoked.  Performed at [facility], [place] [address], [place], [place] [address]   Blood Culture (routine x 2)     Status: None   Collection Time: 10/05/20 12:00 PM   Specimen: BLOOD  Result Value Ref Range Status   Specimen Description   Final    BLOOD RIGHT ANTECUBITAL Performed at [place] [address], [place], [place] [address]    Special Requests   Final    BOTTLES DRAWN AEROBIC AND ANAEROBIC Blood Culture results may not be optimal due to an inadequate volume of blood received in culture bottles Performed at [facility] [address] [place], [place] [address]    Culture   Final    NO GROWTH 5 DAYS Performed at [facility], [place] [address] [place], [place] [address]    Report Status 10/10/2020 FINAL  Final  Body fluid culture w Gram Stain     Status: None   Collection Time: 10/05/20  3:07 PM   Specimen: Peritoneal Cavity; Peritoneal Fluid  Result Value Ref Range Status   Specimen Description   Final    PERITONEAL CAVITY Performed at [facility] [address]  [place], [place] [address]    Special Requests   Final    NONE Performed at [facility], [place] [address], [place], [place] [address]    Gram Stain   Final    NO SQUAMOUS EPITHELIAL CELLS SEEN FEW WBC SEEN NO ORGANISMS SEEN    Culture   Final    NO GROWTH Performed at [facility], [place] [address], [place], [place] [address]    Report Status 10/08/2020 FINAL  Final  Culture, body fluid w Gram Stain-bottle     Status: None   Collection Time: 10/05/20  5:02 PM   Specimen: Fluid  Result Value Ref Range Status   Specimen Description FLUID RIGHT PERITONEAL  Final   Special Requests   Final    BOTTLES DRAWN AEROBIC AND ANAEROBIC Blood Culture adequate volume   Culture   Final    NO GROWTH 5 DAYS Performed at [facility], [place] [address] [place], [place] [address]    Report Status 10/10/2020 FINAL  Final  Gram stain     Status: None   Collection Time: 10/05/20  5:02 PM   Specimen: Fluid  Result Value Ref Range Status   Specimen Description FLUID RIGHT PERITONEAL  Final   Special Requests NONE  Final   Gram Stain   Final    RARE SQUAMOUS EPITHELIAL CELLS PRESENT RARE WBC SEEN NO ORGANISMS SEEN Performed at [facility], [place] [address], [place], [place] [address]    Report Status 10/06/2020 FINAL  Final  Urine Culture     Status: Abnormal   Collection Time: 10/05/20  9:19 PM   Specimen: In/Out Cath Urine  Result Value Ref Range Status   Specimen Description   Final    IN/OUT CATH URINE Performed at [facility], [place] [address] [place], [place] [address]    Special Requests   Final    NONE Performed at [facility], [place] [address] [place], [place] [address]    Culture 10,000 COLONIES/mL STAPHYLOCOCCUS SIMULANS (A)  Final   Report Status 10/08/2020 FINAL  Final   Organism ID, Bacteria STAPHYLOCOCCUS SIMULANS (A)  Final      Susceptibility   Staphylococcus simulans - MIC*    CIPROFLOXACIN <=0.5  SENSITIVE Sensitive     GENTAMICIN <=0.5 SENSITIVE Sensitive     NITROFURANTOIN <=16 SENSITIVE Sensitive     OXACILLIN <=0.25 SENSITIVE Sensitive     TETRACYCLINE <=1 SENSITIVE Sensitive     VANCOMYCIN <=0.5 SENSITIVE Sensitive     TRIMETH/SULFA <=10 SENSITIVE Sensitive     CLINDAMYCIN <=0.25 SENSITIVE Sensitive     RIFAMPIN <=0.5 SENSITIVE Sensitive     Inducible Clindamycin NEGATIVE Sensitive     * 10,000 COLONIES/mL STAPHYLOCOCCUS SIMULANS  Body fluid culture w Gram Stain     Status: None   Collection Time: 10/07/20 12:40 PM   Specimen: Peritoneal Washings  Result Value Ref Range Status   Specimen Description   Final    PERITONEAL Performed at [place] [address] [place], [place] [address]    Special Requests   Final    NONE Performed at [facility], [place] [address], [place], [place] [address]    Gram Stain   Final    RARE WBC PRESENT,BOTH PMN AND MONONUCLEAR NO ORGANISMS SEEN    Culture   Final    NO GROWTH Performed at [facility], [place] [address], [place], [place] [address]    Report Status 10/11/2020 FINAL  Final          Radiology Studies: IR IMAGING GUIDED PORT INSERTION  Result Date: 10/14/2020 CLINICAL DATA:  Primary peritoneal carcinomatosis EXAM: TUNNELED PORT CATHETER PLACEMENT WITH ULTRASOUND AND FLUOROSCOPIC GUIDANCE FLUOROSCOPY TIME:  84 seconds; 38 mGy ANESTHESIA/SEDATION: Intravenous Fentanyl 7mg and Versed 1.'5mg'$  were administered as conscious sedation during continuous monitoring of the patient's level of consciousness and physiological / cardiorespiratory status by the radiology RN, with a total moderate sedation time of 14 minutes. TECHNIQUE: The procedure, risks, benefits, and alternatives were explained to the patient. Questions regarding the procedure were encouraged and answered. The patient understands and consents to the procedure. Patency of the right IJ vein was confirmed with ultrasound  with image documentation. An appropriate skin site was determined. Skin site was marked. Region was prepped using maximum barrier technique including cap and mask, sterile gown, sterile gloves, large sterile sheet, and Chlorhexidine as cutaneous antisepsis. The region was infiltrated locally with 1% lidocaine. Under real-time ultrasound guidance, the right IJ vein was accessed with a 21 gauge micropuncture needle; the needle tip within the vein was confirmed with ultrasound image documentation. Needle was exchanged over a 018 guidewire for transitional dilator, and vascular measurement was performed. A small incision was made on the right anterior chest wall and a subcutaneous pocket fashioned. The power-injectable port was positioned and its catheter tunneled to the right IJ dermatotomy site. The transitional dilator was exchanged over an Amplatz wire for a peel-away sheath, through which the port catheter, which had been trimmed to the appropriate length, was advanced and positioned under fluoroscopy with its tip at the cavoatrial junction. Spot chest radiograph confirms good catheter position and no pneumothorax. The port was flushed per protocol. The pocket was closed with deep interrupted and  subcuticular continuous 3-0 Monocryl sutures. The incisions were covered with Dermabond then covered with a sterile dressing. The patient tolerated the procedure well. COMPLICATIONS: COMPLICATIONS None immediate IMPRESSION: Technically successful right IJ power-injectable port catheter placement. Ready for routine use. Electronically Signed   By: Lucrezia Europe M.D.   On: 10/14/2020 07:59        Scheduled Meds:  (feeding supplement) PROSource Plus  30 mL Oral BID BM   Chlorhexidine Gluconate Cloth  6 each Topical Daily   [START ON 10/15/2020] feeding supplement  1 Container Oral Q24H   feeding supplement  237 mL Oral Q24H   heparin  5,000 Units Subcutaneous Q12H   lidocaine  15 mL Oral Once   multivitamin with  minerals  1 tablet Oral Daily   PACLitaxel  140 mg/m2 (Treatment Plan Recorded) Intravenous Once   pantoprazole (PROTONIX) IV  40 mg Intravenous Q12H   senna-docusate  1 tablet Oral BID   sodium bicarbonate  1,300 mg Oral BID   Continuous Infusions:     LOS: 9 days    Time spent: 35 min minutes    Geradine Girt, DO [facility]   To contact the attending provider between 7A-7P or the covering provider during after hours 7P-7A, please log into the web site [email or web address] and access using universal [place] password for that web site. If you do not have the password, please call the hospital operator.  10/14/2020, 2:44 PM

## 2020-10-14 NOTE — Progress Notes (Signed)
Chemotherapy education completed, patient verbalized understanding and consent signed and placed on shadow chart.

## 2020-10-14 NOTE — Progress Notes (Signed)
Nutrition Follow-up  DOCUMENTATION CODES:   Not applicable  INTERVENTION:  - will decrease 30 ml Prosource Plus from TID to BID, each supplement provides 100 kcal and 15 grams protein.  - continue Boost Breeze once/day, each supplement provides 250 kcal and 9 grams of protein. - will order Ensure Surgery once/day, each supplement provides 330 kcal and 18 grams of protein.   NUTRITION DIAGNOSIS:   Increased nutrient needs related to acute illness, catabolic illness ([id number] infection) as evidenced by estimated needs. -ongoing  GOAL:   Patient will meet greater than or equal to 90% of their needs -unmet on average  MONITOR:   PO intake, Supplement acceptance, Labs, Weight trends, I & O's  ASSESSMENT:   65 year old female with medical history of HTN, type 2 DM, and obesity. She presentd to the ED due to increasing abdominal pain, distention, lower back pain, emesis, and fatigue. She was found to have liver cirrhosis with ascites, AKI, hypotension, mild hyponatremia, and COVID-19 infection. She had IR paracentesis with removal of 3.5 L on 8/15 and repeat paracentesis with removal of 3.3 L on 8/17.  Diet order has changed near daily since admission and patient has had multiple days of being NPO and/or only CLD only.   The only documented meal intake percentage was 100% of lunch on 8/16 (on Heart Healthy/Carb Modified diet). She has been accepting oral nutrition supplements 50-75% of the time offered.   Estimated nutrition needs updated d/t medical course and plans.   She has not been weighed since 8/18. Placed an order this AM for patient to be weighed today. Mild pitting edema to BLE documented in the edema section of flow sheet.   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  Medications reviewed; 1 tablet multivitamin with minerals/day, 40 mg IV protonix BID, 1 tablet senokot BID, 1300 mg oral sodium bicarb BID.    Diet Order:   Diet Order             Diet regular Room service appropriate? Yes; Fluid consistency: Thin  Diet effective now                   EDUCATION NEEDS:   Not appropriate for education at this time  Skin:  Skin Assessment: Reviewed RN Assessment  Last BM:  8/21  Height:   Ht Readings from Last 1 Encounters:  10/08/20 '5\' 7"'$  (1.702 m)    Weight:   Wt Readings from Last 1 Encounters:  10/08/20 87.7 kg    Estimated Nutritional Needs:  Kcal:  2300-2500 kcal Protein:  120-135 grams Fluid:  >/= 2 L/day      Jarome Matin, MS, RD, LDN, CNSC Inpatient Clinical Dietitian RD pager # available in [facility]  After hours/weekend pager # available in [facility]

## 2020-10-14 NOTE — Progress Notes (Signed)
Patient's BP 86/53 prior to start of Taxol, instructions from Dr. Marin Olp to hold on chemotherapy until tomorrow.

## 2020-10-14 NOTE — Plan of Care (Signed)

## 2020-10-14 NOTE — Progress Notes (Signed)
Beth Sanchez is doing okay.  She had a Port-A-Cath placed yesterday.  I do appreciate the outstanding effort of on Interventional Radiology to place this.  We will start her chemotherapy today.  Final immunohistochemical studies came out on the tumor cells.  It does appear to be a gynecologic primary.  Everything certainly makes sense for this, given the presentation and the markedly elevated CA125.  There is been no vomiting.  I do think she should try regular diet.  We really have to get nutrition into her.  She probably cannot eat all that much but I do think regular food with better protein will be able to help her.  Her abdomen might be distended a little bit more.  She had the fluid taken out 2 days ago.  I will know she will need to have some more taken out.  She has had no fever.  There is no bleeding.  All of her vital signs are stable.  Temperature 97.9.  Pulse 93.  Blood pressure 102/68.  Her abdomen is somewhat distended.  There is bowel sounds.  She has no guarding.  There is a fluid wave.  Her lungs are clear.  Cardiac exam regular rate and rhythm.  Again, she should start chemotherapy today.  I would have to believe that this will be quite effective in decreasing the ascites and also decreasing her CA125.  I do appreciate everybody's help in giving chemotherapy.  I really appreciate the wonderful nurses up on [address] who will come down to administer treatment.  I know the staff on [address] are doing a tremendous job with her.  Lattie Haw, MD  Psalm 147:3

## 2020-10-14 NOTE — Progress Notes (Signed)
Received report on the patient from [place], [place], and agree with the current assessment. Will continue to monitor the patient for any changes.

## 2020-10-14 NOTE — Progress Notes (Signed)
Ok to proceed with chemo today.  Will hold bevacizumab this cycle and d/c pegfilgrastim on Day 2. MD aware.

## 2020-10-14 NOTE — Plan of Care (Signed)

## 2020-10-15 ENCOUNTER — Inpatient Hospital Stay (HOSPITAL_COMMUNITY): Payer: Medicare Other

## 2020-10-15 ENCOUNTER — Ambulatory Visit: Payer: Medicare Other | Admitting: Gastroenterology

## 2020-10-15 DIAGNOSIS — R188 Other ascites: Secondary | ICD-10-CM | POA: Diagnosis not present

## 2020-10-15 DIAGNOSIS — C482 Malignant neoplasm of peritoneum, unspecified: Secondary | ICD-10-CM | POA: Diagnosis not present

## 2020-10-15 DIAGNOSIS — R180 Malignant ascites: Secondary | ICD-10-CM | POA: Diagnosis not present

## 2020-10-15 DIAGNOSIS — K7460 Unspecified cirrhosis of liver: Secondary | ICD-10-CM | POA: Diagnosis not present

## 2020-10-15 MED ORDER — SODIUM CHLORIDE 0.9 % IV SOLN
10.0000 mg | Freq: Once | INTRAVENOUS | Status: AC
  Administered 2020-10-16: 10 mg via INTRAVENOUS
  Filled 2020-10-15: qty 1

## 2020-10-15 MED ORDER — FAMOTIDINE IN NACL 20-0.9 MG/50ML-% IV SOLN
20.0000 mg | Freq: Once | INTRAVENOUS | Status: AC
  Administered 2020-10-16: 20 mg via INTRAVENOUS
  Filled 2020-10-15: qty 50

## 2020-10-15 MED ORDER — LIDOCAINE HCL 1 % IJ SOLN
INTRAMUSCULAR | Status: AC
  Filled 2020-10-15: qty 20

## 2020-10-15 MED ORDER — SODIUM CHLORIDE 0.9 % IV BOLUS
500.0000 mL | Freq: Once | INTRAVENOUS | Status: AC
  Administered 2020-10-15: 500 mL via INTRAVENOUS

## 2020-10-15 MED ORDER — SODIUM CHLORIDE 0.9 % IV SOLN: Freq: Once | INTRAVENOUS | Status: DC

## 2020-10-15 MED ORDER — PACLITAXEL CHEMO INJECTION 100 MG/16.7ML
140.0000 mg/m2 | Freq: Once | INTRAVENOUS | Status: DC
  Filled 2020-10-15: qty 48

## 2020-10-15 MED ORDER — HYDROMORPHONE HCL 1 MG/ML IJ SOLN
0.5000 mg | INTRAMUSCULAR | Status: DC | PRN
  Administered 2020-10-15 – 2020-10-21 (×29): 0.5 mg via INTRAVENOUS
  Filled 2020-10-15 (×30): qty 0.5

## 2020-10-15 MED ORDER — DIPHENHYDRAMINE HCL 50 MG/ML IJ SOLN
50.0000 mg | Freq: Once | INTRAMUSCULAR | Status: AC
Start: 2020-10-16 — End: 2020-10-16
  Administered 2020-10-16: 50 mg via INTRAVENOUS
  Filled 2020-10-15: qty 1

## 2020-10-15 MED ORDER — ALBUMIN HUMAN 25 % IV SOLN
12.5000 g | Freq: Once | INTRAVENOUS | Status: AC
  Administered 2020-10-15: 12.5 g via INTRAVENOUS
  Filled 2020-10-15 (×3): qty 50

## 2020-10-15 MED ORDER — ALBUMIN HUMAN 25 % IV SOLN
25.0000 g | Freq: Once | INTRAVENOUS | Status: AC
  Administered 2020-10-15: 25 g via INTRAVENOUS
  Filled 2020-10-15 (×2): qty 100

## 2020-10-15 MED ORDER — ALBUMIN HUMAN 25 % IV SOLN
12.5000 g | Freq: Once | INTRAVENOUS | Status: AC
  Administered 2020-10-15: 12.5 g via INTRAVENOUS
  Filled 2020-10-15: qty 50

## 2020-10-15 NOTE — Procedures (Signed)
  Procedure: US paracentesis   EBL:   minimal Complications:  none immediate  See full dictation in [facility].  D. Shatavia Santor MD Main # [phone] Pager  [phone]    

## 2020-10-15 NOTE — Progress Notes (Signed)
PT Cancellation Note  Patient Details Name: Beth Sanchez MRN: [id number] DOB: Mar 16, 1955   Cancelled Treatment:    Reason Eval/Treat Not Completed: Medical issues which prohibited therapy (RN requested PT hold today 2* low BP and pt going to paracentesis soon. Will follow.) Philomena Doheny PT 10/15/2020  Acute Rehabilitation Services Pager [phone] Office [phone]

## 2020-10-15 NOTE — Progress Notes (Signed)
Ms. Beth Sanchez at the carboplatinum yesterday.  Her blood pressure was a little on the low side and she did not get the Taxol.  Hopefully, she will get Taxol today.  She is having a lot more in the way of vomiting.  I do not think this is from the chemotherapy.  I suspect this is from her ascites which is coming back quickly.  She will need to have another paracentesis.  I will see about giving her albumin after the paracentesis.  She has had no cough.  There is no shortness of breath.  She has had no bleeding.  There is no fever.  Her vital signs are temperature 97.6.  Pulse 84.  Blood pressure 93/69.  Oxygen saturation is 97% on room air.  Her lungs are clear.  She has good air movement bilaterally.  Cardiac exam regular rate and rhythm.  Abdomen is distended.  There is a fluid wave.  There is no guarding or rebound tenderness.  Extremity shows no clubbing, cyanosis or edema.  Neurological exam shows no focal neurological deficits.  Again, this Law[place]son has peritoneal carcinoma.  The primary may actually be the right ovary, at least what was seen on the ultrasound.  Hopefully, she will get the Taxol today.  I am not sure her blood pressure will ever get higher.  She needs to have a paracentesis.  1 possibility is that she gets the paracentesis today.  We will give her some albumin afterwards.  She then may get the Taxol tomorrow.  I know this is quite complicated.  I totally appreciate everybody's efforts to try to get her the chemotherapy.  We will continue to follow along closely.   Lattie Haw, MD  Romans 8:28

## 2020-10-15 NOTE — Progress Notes (Signed)
PROGRESS NOTE    Beth Sanchez  [id number] DOB: 07/19/1955 DOA: 10/05/2020 PCP: Pcp, No    Chief Complaint  Patient presents with   Abdominal Pain    Vomiting, diarrhea    Brief Narrative:  65 year old F with PMH of HTN, IDDM-2 and obesity presenting with increasing abdominal pain, distention, lower back pain, emesis and fatigue, and found to have liver cirrhosis with ascites, AKI, hypotension, mild hyponatremia, and COVID-19 infection.  She had IR perform paracentesis with removal of 3.5 L.  Had leukocytosis to 15.2.  Started on IV ceftriaxone until SBP is excluded.  Cr 3.34.  She had no respiratory symptoms in regards to a COVID-19 infection.  GI consulted.   Patient never had colonoscopy.  No history of GI bleed.  Quit drinking about 30 years ago.  Work-up done concerning for peritoneal carcinomatosis with elevated CA125 and cytology consistent with malignant cells from paracentesis. -Oncology consulted and following. -Patient status post paracentesis x4- last one 8/25 -Port-A-Cath ordered to be placed 10/13/2020 in anticipation of initiation of chemotherapy.     Assessment & Plan:   Principal Problem:   Ascites Active Problems:   Essential hypertension   Hyperlipidemia associated with type 2 diabetes mellitus (HCC)   Type 2 diabetes mellitus with hyperglycemia (HCC)   Cirrhosis of liver (HCC)   Cirrhosis (HCC)   Iron deficiency anemia   AKI (acute kidney injury) ([place])   Hyperkalemia   Hyponatremia   Gastritis   Primary peritoneal carcinomatosis (HCC)   Goals of care, counseling/discussion   large malignant ascites concern for peritoneal carcinomatosis likely of GYN etiology. -Patient presenting with abdominal distention, CT abdomen and pelvis as well as abdominal ultrasound consistent with large volume ascites. -Status post paracentesis with 3.5 L of fluid removed initially on presentation. -Patient with aSAAG < 1.1 arguing against portal hypertension. -Body  fluid cultures with no growth to date with low probability for SBP -Leukocytosis resolved. -No significant exquisite tenderness palpation in the abdominal region -Peritoneal fluid LDH elevated. -Patient had reaccumulation of abdominal ascites and subsequently underwent repeat ultrasound-guided paracentesis 10/07/2020 with 3.3 L of fluid removed. -Patient underwent repeat paracentesis 10/12/2020 with 5.3 L of fluid removed. -Abdomen distended again today.  Will order repeat paracentesis likely to be done tomorrow 10/14/2020 -Cytology consistent with malignant cells. -CT chest with tiny pulmonary nodules and no significant mass or malignancy noted.   -CA 125 elevated at 7281.   -CA 19-9 at 7, CEA at 2.2, AFP at 1.8  -Status post IV albumin.   -Patient status post upper endoscopy which showed small hiatal hernia, reflux and gastritis.  -Due to elevated CA 125 levels, concern for malignant ascites likely from ovarian source oncology consulted. -Colonoscopy attempted however due to poor bowel prep was canceled.  -Oncology recommended transvaginal ultrasound which showed a uterine fundal mass likely a fibroid, right ovarian minimally complex cystic lesion indeterminate, lack of visualization of left ovary. -Oncology recommending initiation of chemotherapy  Acute kidney injury -Likely secondary to prerenal azotemia from hypotension in the setting of Zestoretic, Jardiance. -FeNA of 0.9% suggesting a prerenal etiology. -Urinalysis with trace leukocytes, nitrite negative, negative protein arguing against nephrotic syndrome. -Zestoretic on hold. -Renal function was fluctuating and trending back down with hydration with creatinine currently at 1.24 from 1.38 from 1.23 from 1.73 from 1.85 from 1.23. -Patient unable to tolerate prep and refused NG-tube placement. -Avoid nephrotoxic agents.   -Gentle hydration  Hypotension -In the setting of cirrhosis with ascites and dehydration from GI loss. -BP  improved however soft/borderline.   -Continue to hold antihypertensive medications.  -Patient pancultured with blood cultures pending with no growth to date. -Peritoneal fluid cultures negative to date. -Urine cultures with 10,000 colonies of Staphylococcus stimulants likely of no significance. -Blood cultures negative.   -Peritoneal cultures negative.  -Status post IV albumin.  Hyperkalemia -Received a dose of Lokelma early on in the hospitalization with improvement with hyperkalemia initially and then subsequently developing hyperkalemia again with a potassium of 5.5 and received another dose of Lokelma.   -Potassium of 4.7.   -Follow.   Hyponatremia -Likely secondary to hypervolemia due to concerns for cirrhosis and abdominal ascites  -Zestoretic on hold.  Non-anion gap metabolic acidosis likely from AKI -Increase bicarb tablets to 1300 mg twice daily.    Incidental COVID infection -Patient noted to have tested positive on 10/05/2020. -Patient noted to be vaccinated. -Patient with oxygen sats of 97% on room air.   -No indication for treatment.   -Continue isolation precautions for total of 10 days which will be completed on 10/16/2020.   Non-insulin-dependent diabetes mellitus type 2 -Noted to be on Jardiance and metformin prior to admission which are currently on hold in the setting of AKI. -Hemoglobin A1c 5.9 (10/07/2020)  Iron deficiency anemia -Anemia panel with iron level of 19, ferritin of 511. -Hemoglobin currently at 10.1 from 11.7 from 11.9 from 11.3 from 10.9 from 9.7 from 11.6 on admission. -Patient status post EGD 10/08/2020 which showed small hiatal hernia, reflux and gastritis.   -Colonoscopy canceled due to poor bowel prep.   -Status post IV iron 10/08/2020.   -Transfusion threshold hemoglobin < 7.  -GI was following but have signed off.   Elevated D-dimer -Felt likely secondary to COVID-19 infection. -Continue DVT prophylaxis.    Elevated CA 125/concern for  peritoneal carcinomatosis likely ovarian primary -Patient with elevated levels of CA125 of 7281. -Cytology from paracentesis within malignant cells.   -Concern for ovarian malignancy. 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 -Oncology recommended transvaginal ultrasound which was done and showed a uterine fundal mass likely a fibroid, right ovarian minimally complex cystic lesion indeterminate, lack of visualization of left ovary -Per oncology patient likely a candidate for systemic chemotherapy. -initiation of chemotherapy asap -Per oncology  ?  Ileus -Patient with abdominal distention and hypoactive bowel sounds today..   -Abdominal films obtained concerning for ileus versus obstruction early on in the hospitalization. -Mobilize. -Patient noted to have refused NG tube early on in the hospitalization.  Nutrition Status: mild malnutrition Nutrition Problem: Increased nutrient needs Etiology: acute illness, catabolic illness ([id number] infection) Signs/Symptoms: estimated needs Interventions: Boost Breeze, Ensure Surgery, MVI, Prostat   DVT prophylaxis: Heparin Code Status: Full Family Communication: called sister Disposition:   Status is: Inpatient  Remains inpatient appropriate because:Inpatient level of care appropriate due to severity of illness  Dispo: The patient is from: Home              Anticipated d/c is to: Home              Patient currently is not medically stable to d/c.   Difficult to place patient No       Consultants:  Gastroenterology: Dr. Lyndel Safe 10/06/2020 Oncology: Dr. Marin Olp 10/09/2020  Procedures:  Renal ultrasound 10/05/2020 Right upper quadrant abdominal ultrasound 10/05/2020 Chest x-ray 10/05/2020 CT abdomen and pelvis 10/05/2020 2D echo 10/06/2020 Ultrasound-guided paracentesis -3.3 L of hazy dark amber fluid removed per IR, Darrell Allred, PA 10/07/2020  Ultrasound-guided paracentesis  10/05/2020 per Darrell Allred, PA IR with 3.5 L of hazy fluid removed. Upper endoscopy 10/08/2020 CT chest 10/07/2020 Ultrasound-guided paracentesis--5.3 L of clear yellow fluid removed 10/12/2020 per IR, Ascencion Dike, PA Port-A-Cath placement pending 10/13/2020   Antimicrobials: IV Rocephin 10/05/2020>>>>>> 10/08/2020 IV Flagyl 10/05/2020 x 1 dose   Subjective: Vomiting his AM  Objective: Vitals:   10/15/20 1210 10/15/20 1222 10/15/20 1357 10/15/20 1425  BP: (!) '89/65 94/73 92/74 '$ (!) 87/52  Pulse: 98 95 95 68  Resp:   17   Temp:      TempSrc:      SpO2: 97% 97% 96% 97%  Weight:      Height:        Intake/Output Summary (Last 24 hours) at 10/15/2020 1453 Last data filed at 10/15/2020 [id number] Gross per 24 hour  Intake --  Output 250 ml  Net -250 ml   Filed Weights   10/06/20 0000 10/08/20 1351  Weight: 87.7 kg 87.7 kg    Examination:   General: Appearance:    Obese female who appears uncomfortable     Lungs:    respirations unlabored  Heart:    Normal heart rate.    MS:   All extremities are intact.    Neurologic:   Awake, alert, oriented x 3. Poor insight        Data Reviewed: I have personally reviewed following labs and imaging studies  CBC: Recent Labs  Lab 10/10/20 0416 10/11/20 0842 10/12/20 0349 10/13/20 0715 10/14/20 0745  WBC 9.4 8.6 7.5 6.3 7.3  NEUTROABS 8.6*  --  6.8 5.7 6.6  HGB 10.8* 11.9* 11.7* 10.1* 10.9*  HCT 34.6* 40.0 38.4 32.2* 34.8*  MCV 85.6 90.7 85.7 85.6 85.5  PLT 258 176 286 198 [id number]    Basic Metabolic Panel: Recent Labs  Lab 10/09/20 0401 10/09/20 0915 10/10/20 0416 10/11/20 0842 10/12/20 0349 10/13/20 0715 10/14/20 0745  NA 141  --  137 139 137 137 140  K 5.3*   < > 5.0 5.0 4.9 4.7 4.3  CL 108  --  106 112* 108 108 109  CO2 17*  --  20* 16* 19* 17* 18*  GLUCOSE 112*  --  89 94 92 70 103*  BUN 52*  --  52* 39* 40* 36* 32*  CREATININE 1.85*  --  1.73* 1.23* 1.38* 1.24* 1.27*  CALCIUM 9.8  --  9.5 9.5 9.7 9.8 9.4  MG  2.0  --  2.0 1.9 2.0  --   --   PHOS  --   --  4.0 2.9  --  3.4 2.8   < > = values in this interval not displayed.    GFR: Estimated Creatinine Clearance: 50.2 mL/min (A) (by C-G formula based on SCr of 1.27 mg/dL (H)).  Liver Function Tests: Recent Labs  Lab 10/10/20 0416 10/11/20 0842 10/13/20 0715 10/14/20 0745  ALBUMIN 3.2* 3.2* 3.9 3.3*    CBG: Recent Labs  Lab 10/11/20 2135 10/12/20 0742 10/12/20 0839 10/12/20 1404 10/12/20 1722  GLUCAP 93 78 88 86 81     Recent Results (from the past 240 hour(s))  Body fluid culture w Gram Stain     Status: None   Collection Time: 10/05/20  3:07 PM   Specimen: Peritoneal Cavity; Peritoneal Fluid  Result Value Ref Range Status   Specimen Description   Final    PERITONEAL CAVITY Performed at [facility] [address] [place], [place] [address]  Special Requests   Final    NONE Performed at [facility], [place] [address], [place], [place] [address]    Gram Stain   Final    NO SQUAMOUS EPITHELIAL CELLS SEEN FEW WBC SEEN NO ORGANISMS SEEN    Culture   Final    NO GROWTH Performed at [facility], [place] [address] [place], [place] [address]    Report Status 10/08/2020 FINAL  Final  Culture, body fluid w Gram Stain-bottle     Status: None   Collection Time: 10/05/20  5:02 PM   Specimen: Fluid  Result Value Ref Range Status   Specimen Description FLUID RIGHT PERITONEAL  Final   Special Requests   Final    BOTTLES DRAWN AEROBIC AND ANAEROBIC Blood Culture adequate volume   Culture   Final    NO GROWTH 5 DAYS Performed at [facility], [place] [address], [place], [place] [address]    Report Status 10/10/2020 FINAL  Final  Gram stain     Status: None   Collection Time: 10/05/20  5:02 PM   Specimen: Fluid  Result Value Ref Range Status   Specimen Description FLUID RIGHT PERITONEAL  Final   Special Requests NONE  Final   Gram Stain   Final    RARE SQUAMOUS EPITHELIAL  CELLS PRESENT RARE WBC SEEN NO ORGANISMS SEEN Performed at [facility], [place] [address] [place], [place] [address]    Report Status 10/06/2020 FINAL  Final  Urine Culture     Status: Abnormal   Collection Time: 10/05/20  9:19 PM   Specimen: In/Out Cath Urine  Result Value Ref Range Status   Specimen Description   Final    IN/OUT CATH URINE Performed at [facility] [address], [place], [place] [address]    Special Requests   Final    NONE Performed at [facility], [place] [address] [place], [place] [address]    Culture 10,000 COLONIES/mL STAPHYLOCOCCUS SIMULANS (A)  Final   Report Status 10/08/2020 FINAL  Final   Organism ID, Bacteria STAPHYLOCOCCUS SIMULANS (A)  Final      Susceptibility   Staphylococcus simulans - MIC*    CIPROFLOXACIN <=0.5 SENSITIVE Sensitive     GENTAMICIN <=0.5 SENSITIVE Sensitive     NITROFURANTOIN <=16 SENSITIVE Sensitive     OXACILLIN <=0.25 SENSITIVE Sensitive     TETRACYCLINE <=1 SENSITIVE Sensitive     VANCOMYCIN <=0.5 SENSITIVE Sensitive     TRIMETH/SULFA <=10 SENSITIVE Sensitive     CLINDAMYCIN <=0.25 SENSITIVE Sensitive     RIFAMPIN <=0.5 SENSITIVE Sensitive     Inducible Clindamycin NEGATIVE Sensitive     * 10,000 COLONIES/mL STAPHYLOCOCCUS SIMULANS  Body fluid culture w Gram Stain     Status: None   Collection Time: 10/07/20 12:40 PM   Specimen: Peritoneal Washings  Result Value Ref Range Status   Specimen Description   Final    PERITONEAL Performed at [facility] [address] [place], [place] [address]    Special Requests   Final    NONE Performed at [facility], [place] [address] [place], [place] [address]    Gram Stain   Final    RARE WBC PRESENT,BOTH PMN AND MONONUCLEAR NO ORGANISMS SEEN    Culture   Final    NO GROWTH Performed at [facility], [place] [address], [place], [place] [address]    Report Status 10/11/2020 FINAL  Final           Radiology Studies: DG Abd Portable 1V  Result Date: 10/15/2020 CLINICAL DATA:  Abdominal pain EXAM: PORTABLE ABDOMEN - 1 VIEW COMPARISON:  Radiograph 10/12/2020 FINDINGS: Decreased distention of small bowel in the left hemiabdomen. There is no acute osseous abnormality. Mild right hip degenerative changes. IMPRESSION: Decreased distention of small bowel in the left hemiabdomen. Electronically Signed   By: Maurine Simmering M.D.   On: 10/15/2020 12:35   IR IMAGING GUIDED PORT INSERTION  Result Date: 10/14/2020 CLINICAL DATA:  Primary peritoneal carcinomatosis EXAM: TUNNELED PORT CATHETER PLACEMENT WITH ULTRASOUND AND FLUOROSCOPIC GUIDANCE FLUOROSCOPY TIME:  84 seconds; 38 mGy ANESTHESIA/SEDATION: Intravenous Fentanyl 65mg and Versed 1.'5mg'$  were administered as conscious sedation during continuous monitoring of the patient's level of consciousness and physiological / cardiorespiratory status by the radiology RN, with a total moderate sedation time of 14 minutes. TECHNIQUE: The procedure, risks, benefits, and alternatives were explained to the patient. Questions regarding the procedure were encouraged and answered. The patient understands and consents to the procedure. Patency of the right IJ vein was confirmed with ultrasound with image documentation. An appropriate skin site was determined. Skin site was marked. Region was prepped using maximum barrier technique including cap and mask, sterile gown, sterile gloves, large sterile sheet, and Chlorhexidine as cutaneous antisepsis. The region was infiltrated locally with 1% lidocaine. Under real-time ultrasound guidance, the right IJ vein was accessed with a 21 gauge micropuncture needle; the needle tip within the vein was confirmed with ultrasound image documentation. Needle was exchanged over a 018 guidewire for transitional dilator, and vascular measurement was performed. A small incision was made on the right anterior chest wall and a subcutaneous  pocket fashioned. The power-injectable port was positioned and its catheter tunneled to the right IJ dermatotomy site. The transitional dilator was exchanged over an Amplatz wire for a peel-away sheath, through which the port catheter, which had been trimmed to the appropriate length, was advanced and positioned under fluoroscopy with its tip at the cavoatrial junction. Spot chest radiograph confirms good catheter position and no pneumothorax. The port was flushed per protocol. The pocket was closed with deep interrupted and subcuticular continuous 3-0 Monocryl sutures. The incisions were covered with Dermabond then covered with a sterile dressing. The patient tolerated the procedure well. COMPLICATIONS: COMPLICATIONS None immediate IMPRESSION: Technically successful right IJ power-injectable port catheter placement. Ready for routine use. Electronically Signed   By: DLucrezia EuropeM.D.   On: 10/14/2020 07:59        Scheduled Meds:  (feeding supplement) PROSource Plus  30 mL Oral BID BM   Chlorhexidine Gluconate Cloth  6 each Topical Daily   [START ON 10/16/2020] diphenhydrAMINE  50 mg Intravenous Once   feeding supplement  1 Container Oral Q24H   feeding supplement  237 mL Oral Q24H   heparin  5,000 Units Subcutaneous Q12H   lidocaine  15 mL Oral Once   multivitamin with minerals  1 tablet Oral Daily   [START ON 10/16/2020] PACLitaxel  140 mg/m2 (Treatment Plan Recorded) Intravenous Once   pantoprazole (PROTONIX) IV  40 mg Intravenous Q12H   senna-docusate  1 tablet Oral BID   sodium bicarbonate  1,300 mg Oral BID   Continuous Infusions:  [START ON 10/16/2020] sodium chloride     [START ON 10/16/2020] dexamethasone (DECADRON) IVPB ([facility])     [START ON 10/16/2020] famotidine (PEPCID) IV (ONCOLOGY)        LOS: 10 days    Time  spent: 35 minutes    Geradine Girt, DO [facility]   To contact the attending provider between 7A-7P or the covering provider during after hours 7P-7A,  please log into the web site [email or web address] and access using universal [place] password for that web site. If you do not have the password, please call the hospital operator.  10/15/2020, 2:53 PM

## 2020-10-16 ENCOUNTER — Encounter (HOSPITAL_COMMUNITY): Payer: Self-pay | Admitting: Hematology & Oncology

## 2020-10-16 DIAGNOSIS — N179 Acute kidney failure, unspecified: Secondary | ICD-10-CM | POA: Diagnosis not present

## 2020-10-16 DIAGNOSIS — R180 Malignant ascites: Secondary | ICD-10-CM | POA: Diagnosis not present

## 2020-10-16 DIAGNOSIS — K567 Ileus, unspecified: Secondary | ICD-10-CM | POA: Diagnosis not present

## 2020-10-16 DIAGNOSIS — C482 Malignant neoplasm of peritoneum, unspecified: Secondary | ICD-10-CM | POA: Diagnosis not present

## 2020-10-16 LAB — CBC WITH DIFFERENTIAL/PLATELET
Abs Immature Granulocytes: 0.03 10*3/uL (ref 0.00–0.07)
Basophils Absolute: 0 10*3/uL (ref 0.0–0.1)
Basophils Relative: 0 %
Eosinophils Absolute: 0 10*3/uL (ref 0.0–0.5)
Eosinophils Relative: 0 %
HCT: 35.4 % — ABNORMAL LOW (ref 36.0–46.0)
Hemoglobin: 11 g/dL — ABNORMAL LOW (ref 12.0–15.0)
Immature Granulocytes: 1 %
Lymphocytes Relative: 5 %
Lymphs Abs: 0.3 10*3/uL — ABNORMAL LOW (ref 0.7–4.0)
MCH: 26.8 pg (ref 26.0–34.0)
MCHC: 31.1 g/dL (ref 30.0–36.0)
MCV: 86.3 fL (ref 80.0–100.0)
Monocytes Absolute: 0.4 10*3/uL (ref 0.1–1.0)
Monocytes Relative: 6 %
Neutro Abs: 4.9 10*3/uL (ref 1.7–7.7)
Neutrophils Relative %: 88 %
Platelets: 207 10*3/uL (ref 150–400)
RBC: 4.1 MIL/uL (ref 3.87–5.11)
RDW: 17.4 % — ABNORMAL HIGH (ref 11.5–15.5)
WBC: 5.6 10*3/uL (ref 4.0–10.5)
nRBC: 0 % (ref 0.0–0.2)

## 2020-10-16 LAB — COMPREHENSIVE METABOLIC PANEL
ALT: 9 U/L (ref 0–44)
AST: 20 U/L (ref 15–41)
Albumin: 3.6 g/dL (ref 3.5–5.0)
Alkaline Phosphatase: 76 U/L (ref 38–126)
Anion gap: 9 (ref 5–15)
BUN: 38 mg/dL — ABNORMAL HIGH (ref 8–23)
CO2: 22 mmol/L (ref 22–32)
Calcium: 9.8 mg/dL (ref 8.9–10.3)
Chloride: 112 mmol/L — ABNORMAL HIGH (ref 98–111)
Creatinine, Ser: 1.68 mg/dL — ABNORMAL HIGH (ref 0.44–1.00)
GFR, Estimated: 34 mL/min — ABNORMAL LOW (ref >60)
Glucose, Bld: 92 mg/dL (ref 70–99)
Potassium: 4.5 mmol/L (ref 3.5–5.1)
Sodium: 143 mmol/L (ref 135–145)
Total Bilirubin: 0.8 mg/dL (ref 0.3–1.2)
Total Protein: 6 g/dL — ABNORMAL LOW (ref 6.5–8.1)

## 2020-10-16 LAB — MAGNESIUM: Magnesium: 2.3 mg/dL (ref 1.7–2.4)

## 2020-10-16 MED ORDER — FLUDROCORTISONE ACETATE 0.1 MG PO TABS
0.2000 mg | ORAL_TABLET | Freq: Two times a day (BID) | ORAL | Status: DC
  Administered 2020-10-16 – 2020-10-21 (×11): 0.2 mg via ORAL
  Filled 2020-10-16 (×13): qty 2

## 2020-10-16 MED ORDER — SODIUM CHLORIDE 0.9 % IV SOLN: INTRAVENOUS | Status: DC

## 2020-10-16 MED ORDER — MIDODRINE HCL 5 MG PO TABS
2.5000 mg | ORAL_TABLET | Freq: Three times a day (TID) | ORAL | Status: DC
  Administered 2020-10-16 – 2020-10-18 (×6): 2.5 mg via ORAL
  Filled 2020-10-16 (×7): qty 1

## 2020-10-16 MED ORDER — MIDODRINE HCL 5 MG PO TABS: 2.5000 mg | ORAL_TABLET | Freq: Three times a day (TID) | ORAL | Status: DC

## 2020-10-16 NOTE — Progress Notes (Signed)
PT Cancellation Note  Patient Details Name: Beth Sanchez MRN: [id number] DOB: Jun 24, 1955   Cancelled Treatment:    Reason Eval/Treat Not Completed: Medical issues which prohibited therapy  Pt's BP remains low.  Also plan for treatment today with the Taxol and monitoring per notes.  Will hold PT today and check back as schedule permits.   Syrita Dovel,KATHrine E 10/16/2020, 9:38 AM Jannette Spanner PT, DPT Acute Rehabilitation Services Pager: [phone] Office: [phone]

## 2020-10-16 NOTE — Progress Notes (Signed)
   10/16/20 2243  Assess: MEWS Score  Temp 97.7 F (36.5 C)  BP 98/71  Pulse Rate (!) 111  Resp 20  Level of Consciousness Alert  SpO2 100 %  O2 Device Room Air  Assess: MEWS Score  MEWS Temp 0  MEWS Systolic 1  MEWS Pulse 2  MEWS RR 0  MEWS LOC 0  MEWS Score 3  MEWS Score Color Yellow  Assess: if the MEWS score is Yellow or Red  Were vital signs taken at a resting state? No  Focused Assessment No change from prior assessment  Does the patient meet 2 or more of the SIRS criteria? No  Does the patient have a confirmed or suspected source of infection? Yes  Provider and Rapid Response Notified? Yes  MEWS guidelines implemented *See Row Information* Yes  Take Vital Signs  Increase Vital Sign Frequency  Yellow: Q 2hr X 2 then Q 4hr X 2, if remains yellow, continue Q 4hrs  Escalate  MEWS: Escalate Yellow: discuss with charge nurse/RN and consider discussing with provider and RRT  Notify: Charge Nurse/RN  Name of Charge Nurse/RN Notified [place], RN  Date Charge Nurse/RN Notified 10/16/20  Time Charge Nurse/RN Notified 2255  Notify: Provider  Provider Name/Title Jeannette Corpus, NP  Date Provider Notified 10/16/20  Time Provider Notified 2252  Notification Type Page  Notification Reason Other (Comment) (Yellow MEWS)  Provider response No new orders  Date of Provider Response 10/16/20  Time of Provider Response 2253  Assess: SIRS CRITERIA  SIRS Temperature  0  SIRS Pulse 1  SIRS Respirations  0  SIRS WBC 0  SIRS Score Sum  1

## 2020-10-16 NOTE — Progress Notes (Signed)
Patient's BP prior to start is 85/63, contacting physician to get clarity on BP and whether Taxol can be safely administered.

## 2020-10-16 NOTE — Progress Notes (Signed)
Blood pressure still or problem with respect to giving her chemotherapy.  I will go ahead and start her on Florinef.  Hopefully this will get her blood pressure up.  She had 4.8 L of fluid removed yesterday.  The ascites is certainly recurring fairly quickly.  I would hate to have to put in some kind of catheter.  I think were going to have to move ahead with treatment today with the Taxol.  We we will have her monitored.  I think she will do okay with the Taxol.  Hopefully the Florinef will help get her blood pressure up.  She is really not eating much.  A lot of this has to do with the fact that she has a ascites and this is pushing on her intestines.  Hopefully the fluid that was removed will help allow her intestines to expand a little bit.  I know she is trying her best.  There is no problems with pain.  She not have any bowel movements.  She is having no problems urinating.  Her labs show white cell count 5.6.  Hemoglobin 11.  Platelet count 207,000.  Her albumin is 3.6.  Her BUN is 38 creatinine 1.68.  She has had no cough.  There is no shortness of breath.  There is no bleeding.  Her blood pressure was 90/60.  Again, we really have to try to get this up.  She is on no blood pressure medication.  She seemed to think that the problem was that she was put on lisinopril 6 months ago.  I do not think this really is an issue.  She is on no blood pressure medicine nor did she come in on blood pressure medicine.  Her lungs are clear.  Cardiac exam regular rate and rhythm.  Abdomen is soft.  There is some fluid.  Bowel sounds are decreased.  There is no guarding or rebound tenderness.  Ms. Dewberry has primary peritoneal carcinoma.  Hopefully, we can give her the Taxol today.  She got carboplatinum 2 days ago.  We cannot give her Avastin because of the Port-A-Cath.  Hopefully the Florinef will help.  I do appreciate everybody's help with trying to give her treatment.  I know that this is  incredibly complicated.  I appreciate everybody's efforts in trying to get Ms. Vidana better so she can go home at some point.   Lattie Haw, MD  Proverbs 12:28

## 2020-10-16 NOTE — Progress Notes (Signed)
Dr. Marin Olp confirmed not to give chemo today due to hypotension.

## 2020-10-16 NOTE — Plan of Care (Signed)

## 2020-10-16 NOTE — Progress Notes (Signed)
PROGRESS NOTE    Beth Sanchez  [id number] DOB: 08-Nov-1955 DOA: 10/05/2020 PCP: Pcp, No    Chief Complaint  Patient presents with   Abdominal Pain    Vomiting, diarrhea    Brief Narrative:  65 year old F with PMH of HTN, IDDM-2 and obesity presenting with increasing abdominal pain, distention, lower back pain, emesis and fatigue, and found to have liver cirrhosis with ascites, AKI, hypotension, mild hyponatremia, and COVID-19 infection.  She had IR perform paracentesis with removal of 3.5 L.  Had leukocytosis to 15.2.  Started on IV ceftriaxone until SBP is excluded.  Cr 3.34.  She had no respiratory symptoms in regards to a COVID-19 infection.  GI consulted.   Patient never had colonoscopy.  No history of GI bleed.  Quit drinking about 30 years ago.  Work-up done concerning for peritoneal carcinomatosis with elevated CA125 and cytology consistent with malignant cells from paracentesis. -Oncology consulted and following. -Patient status post paracentesis x4- last one 8/25 -  initiation of chemotherapy once her BP stable     Assessment & Plan:   Principal Problem:   Ascites Active Problems:   Essential hypertension   Hyperlipidemia associated with type 2 diabetes mellitus (HCC)   Type 2 diabetes mellitus with hyperglycemia (HCC)   Cirrhosis of liver (HCC)   Cirrhosis (HCC)   Iron deficiency anemia   AKI (acute kidney injury) ([place])   Hyperkalemia   Hyponatremia   Gastritis   Primary peritoneal carcinomatosis (HCC)   Goals of care, counseling/discussion   primary peritoneal carcinoma -Patient presenting with abdominal distention, CT abdomen and pelvis as well as abdominal ultrasound consistent with large volume ascites. -Status post paracentesis x4 -Body fluid cultures with no growth to date with low probability for SBP -Leukocytosis resolved. -Cytology consistent with malignant cells. -CT chest with tiny pulmonary nodules and no significant mass or malignancy  noted.   -CA 125 elevated at 7281.   -CA 19-9 at 7, CEA at 2.2, AFP at 1.8  -Patient status post upper endoscopy which showed small hiatal hernia, reflux and gastritis.  -Due to elevated CA 125 levels, concern for malignant ascites likely from ovarian source oncology consulted. -Colonoscopy attempted however due to poor bowel prep was canceled.  -Oncology recommended transvaginal ultrasound which showed a uterine fundal mass likely a fibroid, right ovarian minimally complex cystic lesion indeterminate, lack of visualization of left ovary. -Oncology recommending initiation of chemotherapy once her BP stable  Acute kidney injury -Likely secondary to prerenal azotemia from hypotension in the setting of Zestoretic, Jardiance. -Urinalysis with trace leukocytes, nitrite negative, negative protein arguing against nephrotic syndrome. -Zestoretic on hold. -Renal function was fluctuating  -Avoid nephrotoxic agents.   -Gentle hydration PRN when PO intake down  Hypotension -In the setting of cirrhosis with ascites and dehydration from GI loss. -BP improved however soft/borderline.   -Continue to hold antihypertensive medications.  -Patient pancultured with blood cultures pending with no growth to date. -trial of midodrine   Hyperkalemia -Resolved  Hyponatremia -resolved  Non-anion gap metabolic acidosis likely from AKI -was placed on bicarb by prior MD- assess daily to see if she still needs    Incidental COVID infection -Patient noted to have tested positive on 10/05/2020. -Patient noted to be vaccinated. -Patient with oxygen sats of 97% on room air.   -No indication for treatment.   -d/c isolation precuations  Non-insulin-dependent diabetes mellitus type 2 -Noted to be on Jardiance and metformin prior to admission which are currently on hold in the setting of  AKI. -Hemoglobin A1c 5.9 (10/07/2020)  Iron deficiency anemia -Anemia panel with iron level of 19, ferritin of  511. -Hemoglobin currently at 10.1 from 11.7 from 11.9 from 11.3 from 10.9 from 9.7 from 11.6 on admission. -Patient status post EGD 10/08/2020 which showed small hiatal hernia, reflux and gastritis.   -Colonoscopy canceled due to poor bowel prep.   -Status post IV iron 10/08/2020.   -Transfusion threshold hemoglobin < 7.  -GI was following but have signed off.   Elevated D-dimer -Felt likely secondary to COVID-19 infection. -Continue DVT prophylaxis.    ?  Ileus -Patient with abdominal distention and hypoactive bowel sounds today..   -Abdominal films show improvement -Mobilize. -Patient noted to have refused NG tube early on in the hospitalization. -bowel regimen  Nutrition Status: mild malnutrition Nutrition Problem: Increased nutrient needs Etiology: acute illness, catabolic illness ([id number] infection) Signs/Symptoms: estimated needs Interventions: Boost Breeze, Ensure Surgery, MVI, Prostat    DVT prophylaxis: Heparin Code Status: Full Family Communication: called sister 8/25 Disposition:   Status is: Inpatient  Remains inpatient appropriate because:Inpatient level of care appropriate due to severity of illness  Dispo: The patient is from: Home              Anticipated d/c is to: Home              Patient currently is not medically stable to d/c.   Difficult to place patient No       Consultants:  Gastroenterology: Dr. Lyndel Safe 10/06/2020 Oncology: Dr. Marin Olp 10/09/2020  Procedures:  Renal ultrasound 10/05/2020 Right upper quadrant abdominal ultrasound 10/05/2020 Chest x-ray 10/05/2020 CT abdomen and pelvis 10/05/2020 2D echo 10/06/2020 Ultrasound-guided paracentesis -3.3 L of hazy dark amber fluid removed per IR, Darrell Allred, PA 10/07/2020 Ultrasound-guided paracentesis 10/05/2020 per Darrell Allred, PA IR with 3.5 L of hazy fluid removed. Upper endoscopy 10/08/2020 CT chest 10/07/2020 Ultrasound-guided paracentesis--5.3 L of clear yellow fluid removed 10/12/2020 per  IR, Ascencion Dike, PA Port-A-Cath placement  10/13/2020 Paracentesis 8/25     Subjective: Does not like the food here and she is going to have her sister bring her some food  Objective: Vitals:   10/15/20 2104 10/16/20 0435 10/16/20 1055 10/16/20 1251  BP: 94/64 (!) 88/55 (!) 85/63 113/84  Pulse: 94 92 67 (!) 110  Resp: '18 20 16 16  '$ Temp: 97.8 F (36.6 C) 97.6 F (36.4 C) 97.7 F (36.5 C) 97.6 F (36.4 C)  TempSrc: Oral Oral Oral Oral  SpO2: 98% 97% 94% 100%  Weight:      Height:        Intake/Output Summary (Last 24 hours) at 10/16/2020 1422 Last data filed at 10/16/2020 1100 Gross per 24 hour  Intake 159.49 ml  Output 300 ml  Net -140.51 ml   Filed Weights   10/06/20 0000 10/08/20 1351  Weight: 87.7 kg 87.7 kg    Examination:   General: Appearance:    Obese female in no acute distress   Diminished bowel sounds, NT  Lungs:     respirations unlabored  Heart:    Tachycardic.   MS:   All extremities are intact.    Neurologic:   Awake, alert, oriented x 3.       Data Reviewed: I have personally reviewed following labs and imaging studies  CBC: Recent Labs  Lab 10/10/20 0416 10/11/20 0842 10/12/20 0349 10/13/20 0715 10/14/20 0745 10/16/20 0324  WBC 9.4 8.6 7.5 6.3 7.3 5.6  NEUTROABS 8.6*  --  6.8 5.7 6.6 4.9  HGB 10.8* 11.9* 11.7* 10.1* 10.9* 11.0*  HCT 34.6* 40.0 38.4 32.2* 34.8* 35.4*  MCV 85.6 90.7 85.7 85.6 85.5 86.3  PLT 258 176 286 198 240 [id number]    Basic Metabolic Panel: Recent Labs  Lab 10/10/20 0416 10/11/20 0842 10/12/20 0349 10/13/20 0715 10/14/20 0745 10/16/20 0324  NA 137 139 137 137 140 143  K 5.0 5.0 4.9 4.7 4.3 4.5  CL 106 112* 108 108 109 112*  CO2 20* 16* 19* 17* 18* 22  GLUCOSE 89 94 92 70 103* 92  BUN 52* 39* 40* 36* 32* 38*  CREATININE 1.73* 1.23* 1.38* 1.24* 1.27* 1.68*  CALCIUM 9.5 9.5 9.7 9.8 9.4 9.8  MG 2.0 1.9 2.0  --   --  2.3  PHOS 4.0 2.9  --  3.4 2.8  --     GFR: Estimated Creatinine Clearance: 37.9 mL/min  (A) (by C-G formula based on SCr of 1.68 mg/dL (H)).  Liver Function Tests: Recent Labs  Lab 10/10/20 0416 10/11/20 0842 10/13/20 0715 10/14/20 0745 10/16/20 0324  AST  --   --   --   --  20  ALT  --   --   --   --  9  ALKPHOS  --   --   --   --  76  BILITOT  --   --   --   --  0.8  PROT  --   --   --   --  6.0*  ALBUMIN 3.2* 3.2* 3.9 3.3* 3.6    CBG: Recent Labs  Lab 10/11/20 2135 10/12/20 0742 10/12/20 0839 10/12/20 1404 10/12/20 1722  GLUCAP 93 78 88 86 81     Recent Results (from the past 240 hour(s))  Body fluid culture w Gram Stain     Status: None   Collection Time: 10/07/20 12:40 PM   Specimen: Peritoneal Washings  Result Value Ref Range Status   Specimen Description   Final    PERITONEAL Performed at [facility], [place] [address] [place], [place] [address]    Special Requests   Final    NONE Performed at [facility], [place] [address] [place], [place] [address]    Gram Stain   Final    RARE WBC PRESENT,BOTH PMN AND MONONUCLEAR NO ORGANISMS SEEN    Culture   Final    NO GROWTH Performed at [facility], [place] [address], [place], [place] [address]    Report Status 10/11/2020 FINAL  Final          Radiology Studies: US Paracentesis  Result Date: 10/15/2020 INDICATION: Peritoneal carcinomatosis EXAM: ULTRASOUND GUIDED  PARACENTESIS MEDICATIONS: Lidocaine 1% subcutaneous COMPLICATIONS: None immediate PROCEDURE: Informed written consent was obtained from the patient after a discussion of the risks, benefits and alternatives to treatment. A timeout was performed prior to the initiation of the procedure. Initial ultrasound scanning demonstrates a moderate amount of ascites within the right lower abdominal quadrant. The right lower abdomen was prepped and draped in the usual sterile fashion. 1% lidocaine was used for local anesthesia. Following this, a 6 Fr Safe-T-Centesis catheter was introduced. An ultrasound  image was saved for documentation purposes. The paracentesis was performed. The catheter was removed and a dressing was applied. The patient tolerated the procedure well without immediate post procedural complication. Patient received post-procedure intravenous albumin; see nursing notes for details. FINDINGS: A total of approximately 4.8 L of  fluid was removed. IMPRESSION: Successful ultrasound-guided paracentesis yielding 4.8  liters of peritoneal fluid. Electronically Signed   By: Lucrezia Europe M.D.   On: 10/15/2020 15:19   DG Abd Portable 1V  Result Date: 10/15/2020 CLINICAL DATA:  Abdominal pain EXAM: PORTABLE ABDOMEN - 1 VIEW COMPARISON:  Radiograph 10/12/2020 FINDINGS: Decreased distention of small bowel in the left hemiabdomen. There is no acute osseous abnormality. Mild right hip degenerative changes. IMPRESSION: Decreased distention of small bowel in the left hemiabdomen. Electronically Signed   By: Maurine Simmering M.D.   On: 10/15/2020 12:35        Scheduled Meds:  (feeding supplement) PROSource Plus  30 mL Oral BID BM   Chlorhexidine Gluconate Cloth  6 each Topical Daily   feeding supplement  1 Container Oral Q24H   feeding supplement  237 mL Oral Q24H   fludrocortisone  0.2 mg Oral BID   heparin  5,000 Units Subcutaneous Q12H   lidocaine  15 mL Oral Once   midodrine  2.5 mg Oral TID WC   multivitamin with minerals  1 tablet Oral Daily   PACLitaxel  140 mg/m2 (Treatment Plan Recorded) Intravenous Once   pantoprazole (PROTONIX) IV  40 mg Intravenous Q12H   senna-docusate  1 tablet Oral BID   Continuous Infusions:  sodium chloride        LOS: 11 days    Time spent: 35 minutes    Geradine Girt, DO [facility]   To contact the attending provider between 7A-7P or the covering provider during after hours 7P-7A, please log into the web site [email or web address] and access using universal [place] password for that web site. If you do not have the password, please call the  hospital operator.  10/16/2020, 2:22 PM

## 2020-10-16 NOTE — Care Management Important Message (Signed)
Medicare IM printed for [place] [facility] to give to the patient. 

## 2020-10-17 ENCOUNTER — Inpatient Hospital Stay (HOSPITAL_COMMUNITY): Payer: Medicare Other

## 2020-10-17 DIAGNOSIS — R180 Malignant ascites: Secondary | ICD-10-CM | POA: Diagnosis not present

## 2020-10-17 DIAGNOSIS — I10 Essential (primary) hypertension: Secondary | ICD-10-CM | POA: Diagnosis not present

## 2020-10-17 DIAGNOSIS — E875 Hyperkalemia: Secondary | ICD-10-CM | POA: Diagnosis not present

## 2020-10-17 DIAGNOSIS — K7460 Unspecified cirrhosis of liver: Secondary | ICD-10-CM | POA: Diagnosis not present

## 2020-10-17 LAB — CBC WITH DIFFERENTIAL/PLATELET
Abs Immature Granulocytes: 0.02 10*3/uL (ref 0.00–0.07)
Basophils Absolute: 0 10*3/uL (ref 0.0–0.1)
Basophils Relative: 0 %
Eosinophils Absolute: 0 10*3/uL (ref 0.0–0.5)
Eosinophils Relative: 0 %
HCT: 37.4 % (ref 36.0–46.0)
Hemoglobin: 11.5 g/dL — ABNORMAL LOW (ref 12.0–15.0)
Immature Granulocytes: 0 %
Lymphocytes Relative: 4 %
Lymphs Abs: 0.3 10*3/uL — ABNORMAL LOW (ref 0.7–4.0)
MCH: 26.7 pg (ref 26.0–34.0)
MCHC: 30.7 g/dL (ref 30.0–36.0)
MCV: 86.8 fL (ref 80.0–100.0)
Monocytes Absolute: 0.4 10*3/uL (ref 0.1–1.0)
Monocytes Relative: 7 %
Neutro Abs: 6.1 10*3/uL (ref 1.7–7.7)
Neutrophils Relative %: 89 %
Platelets: 239 10*3/uL (ref 150–400)
RBC: 4.31 MIL/uL (ref 3.87–5.11)
RDW: 17.6 % — ABNORMAL HIGH (ref 11.5–15.5)
WBC: 6.8 10*3/uL (ref 4.0–10.5)
nRBC: 0 % (ref 0.0–0.2)

## 2020-10-17 LAB — COMPREHENSIVE METABOLIC PANEL
ALT: 13 U/L (ref 0–44)
AST: 27 U/L (ref 15–41)
Albumin: 3.4 g/dL — ABNORMAL LOW (ref 3.5–5.0)
Alkaline Phosphatase: 88 U/L (ref 38–126)
Anion gap: 12 (ref 5–15)
BUN: 45 mg/dL — ABNORMAL HIGH (ref 8–23)
CO2: 19 mmol/L — ABNORMAL LOW (ref 22–32)
Calcium: 9.3 mg/dL (ref 8.9–10.3)
Chloride: 112 mmol/L — ABNORMAL HIGH (ref 98–111)
Creatinine, Ser: 1.92 mg/dL — ABNORMAL HIGH (ref 0.44–1.00)
GFR, Estimated: 29 mL/min — ABNORMAL LOW (ref >60)
Glucose, Bld: 86 mg/dL (ref 70–99)
Potassium: 4.9 mmol/L (ref 3.5–5.1)
Sodium: 143 mmol/L (ref 135–145)
Total Bilirubin: 1 mg/dL (ref 0.3–1.2)
Total Protein: 6.2 g/dL — ABNORMAL LOW (ref 6.5–8.1)

## 2020-10-17 MED ORDER — METOCLOPRAMIDE HCL 5 MG/ML IJ SOLN: 20.0000 mg | Freq: Three times a day (TID) | INTRAVENOUS | Status: DC

## 2020-10-17 MED ORDER — METOCLOPRAMIDE HCL 5 MG/ML IJ SOLN
10.0000 mg | Freq: Three times a day (TID) | INTRAMUSCULAR | Status: DC
  Administered 2020-10-17 – 2020-10-22 (×16): 10 mg via INTRAVENOUS
  Filled 2020-10-17 (×16): qty 2

## 2020-10-17 NOTE — Evaluation (Signed)
Physical Therapy Evaluation Patient Details Name: Beth Sanchez MRN: [id number] DOB: 05-29-1955 Today's Date: 10/17/2020   History of Present Illness  65 yo female admitted with ascites, pain, hypotension, peritoneal cancer. Hx of DM, obesity, cirrhosis, COVID  Clinical Impression  On eval , pt was MIn guard for mobility. She walked ~20 feet around the room. Pt presents with general weakness, decreased activity tolerance, and impaired gait and balance. BP remains low but she tolerated activity fairly well. She reports she has been ambulating to/from bathroom unassisted. Will continue to follow and progress activity as tolerated.     Follow Up Recommendations Home health PT    Equipment Recommendations  None recommended by PT    Recommendations for Other Services       Precautions / Restrictions Precautions Precautions: Fall Restrictions Weight Bearing Restrictions: No      Mobility  Bed Mobility Overal bed mobility: Modified Independent Bed Mobility: Sidelying to Sit;Sit to Sidelying   Sidelying to sit: Modified independent (Device/Increase time)     Sit to sidelying: Modified independent (Device/Increase time) General bed mobility comments: Increased time. No assistance given.    Transfers Overall transfer level: Needs assistance   Transfers: Sit to/from Stand Sit to Stand: Supervision         General transfer comment: Supv for safety.  Ambulation/Gait Ambulation/Gait assistance: Min guard Gait Distance (Feet): 20 Feet Assistive device: None Gait Pattern/deviations: Step-through pattern;Decreased stride length     General Gait Details: Unsteady at times. Gait appers guarded 2* unsteadiness. Pt denied dizziness.  Stairs            Wheelchair Mobility    Modified Rankin (Stroke Patients Only)       Balance Overall balance assessment: Needs assistance           Standing balance-Leahy Scale: Fair                                Pertinent Vitals/Pain Faces Pain Scale: No hurt    Home Living Family/patient expects to be discharged to:: Private residence Living Arrangements: Other relatives (sister) Available Help at Discharge: Family Type of Home: House       Home Layout: Able to live on main level with bedroom/bathroom;Laundry or work area in [employer]: None      Prior Function Level of Independence: Independent               Journalist, newspaper        Extremity/Trunk Assessment   Upper Extremity Assessment Upper Extremity Assessment: Overall WFL for tasks assessed    Lower Extremity Assessment Lower Extremity Assessment: Generalized weakness    Cervical / Trunk Assessment Cervical / Trunk Assessment: Normal  Communication   Communication: No difficulties  Cognition Arousal/Alertness: Awake/alert Behavior During Therapy: WFL for tasks assessed/performed Overall Cognitive Status: Within Functional Limits for tasks assessed                                        General Comments      Exercises     Assessment/Plan    PT Assessment Patient needs continued PT services  PT Problem List Decreased strength;Decreased mobility;Decreased activity tolerance;Decreased balance;Decreased knowledge of use of DME;Pain       PT Treatment Interventions DME instruction;Gait training;Therapeutic exercise;Balance training;Functional mobility training;Therapeutic activities;Patient/family education    PT Goals (  Current goals can be found in the Care Plan section)  Acute Rehab PT Goals Patient Stated Goal: to feel better PT Goal Formulation: With patient Time For Goal Achievement: 10/31/20 Potential to Achieve Goals: Good    Frequency Min 3X/week   Barriers to discharge        Co-evaluation               AM-PAC PT "6 Clicks" Mobility  Outcome Measure Help needed turning from your back to your side while in a flat bed without using bedrails?:  None Help needed moving from lying on your back to sitting on the side of a flat bed without using bedrails?: None Help needed moving to and from a bed to a chair (including a wheelchair)?: A Little Help needed standing up from a chair using your arms (e.g., wheelchair or bedside chair)?: A Little Help needed to walk in hospital room?: A Little Help needed climbing 3-5 steps with a railing? : A Little 6 Click Score: 20    End of Session   Activity Tolerance: Patient tolerated treatment well Patient left: in bed;with call bell/phone within reach   PT Visit Diagnosis: Unsteadiness on feet (R26.81);Muscle weakness (generalized) (M62.81)    Time: [id number] PT Time Calculation (min) (ACUTE ONLY): 18 min   Charges:   PT Evaluation $PT Eval Moderate Complexity: 1 Mod            Doreatha Massed, PT Acute Rehabilitation  Office: [phone] Pager: [phone]

## 2020-10-17 NOTE — Plan of Care (Signed)

## 2020-10-17 NOTE — Progress Notes (Signed)
Unfortunately, she cannot get the Taxol.  Her blood pressure just is not good enough.  We have her on Florinef to try to get her blood pressure up.  She is not able to eat.  When she tries eat, she just gets sick.  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  When I listened to her abdomen, I really cannot hear much in the way of bowel sounds.  I am sure that this cancer is laying down upon her intestines.  It probably is affecting her internal intestinal nervous system.  She got the carboplatinum last week.  Maybe, we will see some response in the next few days.  I will try her on some Reglan to see if this cannot help stimulate her intestines.  Her abdomen does not appear as distended.  She has had no bleeding.  There is been no diarrhea.  I will send off a prealbumin on her.  It will be interesting to see what that is.  Her labs show white cell count 6.8.  Hemoglobin 11.5.  Platelet count 239,000.  Her BUN is 45 creatinine 1.92.  Her albumin is 3.4.  Her glucose is 86.  Her vital signs show temperature of 98.  Pulse 105.  Blood pressure 74/51.  Oxygen saturation is 96%.  Her lungs sound clear bilaterally.  Cardiac exam regular rate and rhythm.  Abdomen is slightly distended.  Abdomen is soft,.  There is no guarding or rebound tenderness.  There is no obvious abdominal mass.  Extremity shows no clubbing, cyanosis or edema.  Neurological exam shows no focal deficits.  Beth Sanchez is in a very tough spot right now.  We just gave her the carboplatinum.  Hopefully this will help with respect to her malignancy.  If we cannot get her intestines to work, then I think we really are going to be in a bad way.  I do still think there will be any way for [place] to treat her and there will be no way for her to eat.  Doing TNA really has not been shown to improve outcomes.  Enteral feedings would be of no use because she was just throw these up.  I just want to give her a chance.   Again she got the carboplatinum 3 days ago.  Hopefully will start to see some kind of effect in the next 3 or 4 days.  I really think that next week will be critical as far as her prognosis is concerned.  I know this is incredibly complicated.  I very much appreciate the outstanding care that she is getting from the staff up on [address].  Lattie Haw, MD  1 Thessalonians 5:16-18

## 2020-10-17 NOTE — Progress Notes (Signed)
PT Cancellation Note  Patient Details Name: Beth Sanchez MRN: [id number] DOB: Jun 22, 1955   Cancelled Treatment:    Reason Eval/Treat Not Completed: Medical issues which prohibited therapy--BP remains low. Will hold PT for now and will continue to check back.    [facility]  Office: [phone] Pager: [phone]

## 2020-10-17 NOTE — Progress Notes (Signed)
[facility]  PROGRESS NOTE  Beth Sanchez [id number] DOB: [birth year] DOA: 10/05/2020 PCP: Pcp, No   Brief HPI:   65 year old female with a history of hypertension, diabetes mellitus type 2, obesity presents with increased abdominal pain, abdominal distention, low back pain, emesis and fatigue, found to have liver cirrhosis with ascites, AKI, hypotension and hyponatremia.  Also positive for SARS COVID 2 RT-PCR.  Started on IV ceftriaxone. Work-up done concerning for peritoneal carcinomatosis with elevated CA125, cytology consistent with malignant cells from paracentesis.  Oncology was consulted. Patient is s/p paracentesis x4, last one on 8/25 Plan for initiation of chemotherapy once BP stable.    Subjective   Patient seen, denies abdominal pain.   Assessment/Plan:   Peritoneal carcinomatosis -Patient presented with abdominal distention, CT abdomen/pelvis and abdominal ultrasound showed large volume ascites -S/p paracentesis x4 -Body fluid culture showed no growth -Cytology consistent with malignant cells -CT chest showed tiny pulmonary nodules no significant mass or malignancy -CA125 elevated at 7281 -CA 19-9 7, CEA 2.2, AFP 1.8 -Patient s/p upper endoscopy which showed small hiatal hernia, reflux and gastritis -Oncology consulted -Oncology recommended transvaginal ultrasound which showed uterine fundal mass likely fibroid, right ovarian minimally complex cystic lesion indeterminate, lack of visualization of left ovary -Plan for starting chemotherapy once her BP is stable -Colonoscopy attempted, due to poor bowel prep it was canceled  Acute kidney injury -Likely due to prerenal azotemia from hypotension in setting of Zestoretic, Jardiance -Urinalysis with trace leukocytes, nitrate negative, negative protein -Zestoretic on hold -Avoid nephrotoxic agents -Renal function is worsening, will consult nephrology in  a.m.  Hyperkalemia -Resolved  Hyponatremia -Resolved  Ileus -Patient developed ileus in the hospital -She refused NG tube placement early in the hospitalization -Continue bowel regimen  D-dimer elevation -Likely from COVID-19 infection -Continue DVT prophylaxis  Incidental COVID-19 infection -Positive on 10/05/2020 -O2 sats 95% on 2 L of oxygen -Isolation precautions have been discontinued  Iron-deficiency anemia -Anemia panel with iron level of 19, ferritin of 511. -Hemoglobin currently at 10.1 from 11.7 from 11.9 from 11.3 from 10.9 from 9.7 from 11.6 on admission. -Patient status post EGD 10/08/2020 which showed small hiatal hernia, reflux and gastritis.   -Colonoscopy canceled due to poor bowel prep.   -Status post IV iron 10/08/2020.   -Transfusion threshold hemoglobin < 7.  -GI was following but have signed off.   Scheduled medications:    (feeding supplement) PROSource Plus  30 mL Oral BID BM   Chlorhexidine Gluconate Cloth  6 each Topical Daily   feeding supplement  1 Container Oral Q24H   feeding supplement  237 mL Oral Q24H   fludrocortisone  0.2 mg Oral BID   heparin  5,000 Units Subcutaneous Q12H   lidocaine  15 mL Oral Once   metoCLOPramide (REGLAN) injection  10 mg Intravenous Q8H   midodrine  2.5 mg Oral TID WC   multivitamin with minerals  1 tablet Oral Daily   PACLitaxel  140 mg/m2 (Treatment Plan Recorded) Intravenous Once   pantoprazole (PROTONIX) IV  40 mg Intravenous Q12H   senna-docusate  1 tablet Oral BID         Data Reviewed:   CBG:  Recent Labs  Lab 10/11/20 2135 10/12/20 0742 10/12/20 0839 10/12/20 1404 10/12/20 1722  GLUCAP 93 78 88 86 81    SpO2: 95 % O2 Flow Rate (L/min): 2 L/min    Vitals:   10/17/20 0811 10/17/20 1153 10/17/20 1213 10/17/20 1633  BP: (!) 85/58 (!) 83/64 (!) 81/58 Marland Kitchen)  87/60  Pulse: (!) 102 (!) 117 100 99  Resp:   19   Temp:   (!) 97.5 F (36.4 C)   TempSrc:   Oral   SpO2:   95%   Weight:       Height:        No intake or output data in the 24 hours ending 10/17/20 2027  08/26 0701 - 08/27 1900 In: 0  Out: 300 [Urine:300]  Filed Weights   10/06/20 0000 10/08/20 1351  Weight: 87.7 kg 87.7 kg    CBC:  Recent Labs  Lab 10/12/20 0349 10/13/20 0715 10/14/20 0745 10/16/20 0324 10/17/20 0400  WBC 7.5 6.3 7.3 5.6 6.8  HGB 11.7* 10.1* 10.9* 11.0* 11.5*  HCT 38.4 32.2* 34.8* 35.4* 37.4  PLT 286 198 240 207 239  MCV 85.7 85.6 85.5 86.3 86.8  MCH 26.1 26.9 26.8 26.8 26.7  MCHC 30.5 31.4 31.3 31.1 30.7  RDW 16.7* 16.7* 17.1* 17.4* 17.6*  LYMPHSABS 0.3* 0.3* 0.3* 0.3* 0.3*  MONOABS 0.3 0.3 0.4 0.4 0.4  EOSABS 0.0 0.0 0.0 0.0 0.0  BASOSABS 0.0 0.0 0.0 0.0 0.0    Complete metabolic panel:  Recent Labs  Lab 10/11/20 0842 10/12/20 0349 10/13/20 0715 10/14/20 0745 10/16/20 0324 10/17/20 0400  NA 139 137 137 140 143 143  K 5.0 4.9 4.7 4.3 4.5 4.9  CL 112* 108 108 109 112* 112*  CO2 16* 19* 17* 18* 22 19*  GLUCOSE 94 92 70 103* 92 86  BUN 39* 40* 36* 32* 38* 45*  CREATININE 1.23* 1.38* 1.24* 1.27* 1.68* 1.92*  CALCIUM 9.5 9.7 9.8 9.4 9.8 9.3  AST  --   --   --   --  20 27  ALT  --   --   --   --  9 13  ALKPHOS  --   --   --   --  76 88  BILITOT  --   --   --   --  0.8 1.0  ALBUMIN 3.2*  --  3.9 3.3* 3.6 3.4*  MG 1.9 2.0  --   --  2.3  --     No results for input(s): LIPASE, AMYLASE in the last 168 hours.  No results for input(s): CRP, DDIMER, BNP, PROCALCITON, SARSCOV2NAA in the last 168 hours.  Invalid input(s): LACTICACID  ------------------------------------------------------------------------------------------------------------------ No results for input(s): CHOL, HDL, LDLCALC, TRIG, CHOLHDL, LDLDIRECT in the last 72 hours.  Lab Results  Component Value Date   HGBA1C 5.9 (H) 10/07/2020   ------------------------------------------------------------------------------------------------------------------ No results for input(s): TSH, T4TOTAL, T3FREE,  THYROIDAB in the last 72 hours.  Invalid input(s): FREET3 ------------------------------------------------------------------------------------------------------------------ No results for input(s): VITAMINB12, FOLATE, FERRITIN, TIBC, IRON, RETICCTPCT in the last 72 hours.  Coagulation profile No results for input(s): INR, PROTIME in the last 168 hours. No results for input(s): DDIMER in the last 72 hours.  Cardiac Enzymes No results for input(s): CKTOTAL, CKMB, CKMBINDEX, TROPONINI in the last 168 hours.  ------------------------------------------------------------------------------------------------------------------    Component Value Date/Time   BNP 89.7 10/07/2020 0925     Antibiotics: Anti-infectives (From admission, onward)    Start     Dose/Rate Route Frequency Ordered Stop   10/06/20 1200  cefTRIAXone (ROCEPHIN) 2 g in sodium chloride 0.9 % 100 mL IVPB  Status:  Discontinued        2 g 200 mL/hr over 30 Minutes Intravenous Every 24 hours 10/05/20 1718 10/08/20 2121   10/05/20 1145  cefTRIAXone (ROCEPHIN) 2 g in sodium  chloride 0.9 % 100 mL IVPB        2 g 200 mL/hr over 30 Minutes Intravenous  Once 10/05/20 1140 10/05/20 1224   10/05/20 1145  metroNIDAZOLE (FLAGYL) IVPB 500 mg        500 mg 100 mL/hr over 60 Minutes Intravenous  Once 10/05/20 1140 10/05/20 1339        Radiology Reports  DG Abd Portable 1V  Result Date: 10/17/2020 CLINICAL DATA:  Abdominal distension, abdominal pain. History of pancreatitis. EXAM: PORTABLE ABDOMEN - 1 VIEW COMPARISON:  Plain films of the abdomen dated 10/15/2020 an 10/09/2020. FINDINGS: No dilated large or small bowel loops are seen. No evidence of free intraperitoneal air is seen. Ill-defined airspace opacity at the LEFT lung base, likely atelectasis. Degenerative spondylosis of the slightly scoliotic thoracolumbar spine, mild to moderate in degree. No acute-appearing osseous abnormality. IMPRESSION: 1. Nonobstructive bowel gas  pattern. 2. Probable atelectasis at the LEFT lung base. Electronically Signed   By: Franki Cabot M.D.   On: 10/17/2020 10:58      DVT prophylaxis: Heparin  Code Status: Full code  Family Communication: No family at bedside   Consultants: Gastroenterology: Dr. Lyndel Safe 10/06/2020 Oncology: Dr. Marin Olp 10/09/2020    Procedures: Renal ultrasound 10/05/2020 Right upper quadrant abdominal ultrasound 10/05/2020 Chest x-ray 10/05/2020 CT abdomen and pelvis 10/05/2020 2D echo 10/06/2020 Ultrasound-guided paracentesis -3.3 L of hazy dark amber fluid removed per IR, Darrell Allred, PA 10/07/2020 Ultrasound-guided paracentesis 10/05/2020 per Darrell Allred, PA IR with 3.5 L of hazy fluid removed. Upper endoscopy 10/08/2020 CT chest 10/07/2020 Ultrasound-guided paracentesis--5.3 L of clear yellow fluid removed 10/12/2020 per IR, Ascencion Dike, PA Port-A-Cath placement  10/13/2020 Paracentesis 8/25       Objective    Physical Examination:   General-appears in no acute distress Heart-S1-S2, regular, no murmur auscultated Lungs-clear to auscultation bilaterally, no wheezing or crackles auscultated Abdomen-soft, nontender, no organomegaly, abdominal distention Extremities-no edema in the lower extremities Neuro-alert, oriented x3, no focal deficit noted  Status is: Inpatient  Dispo: The patient is from: Home              Anticipated d/c is to: Home              Anticipated d/c date is: 10/20/2020              Patient currently not stable for discharge   COVID-19 Labs  No results for input(s): DDIMER, FERRITIN, LDH, CRP in the last 72 hours.  Lab Results  Component Value Date   SARSCOV2NAA POSITIVE (A) 10/05/2020    Microbiology  No results found for this or any previous visit (from the past 240 hour(s)).       Oswald Hillock   [facility] If 7PM-7AM, please contact night-coverage at [email or web address], Office  [phone]   10/17/2020, 8:27 PM  LOS: 12 days

## 2020-10-18 DIAGNOSIS — N179 Acute kidney failure, unspecified: Secondary | ICD-10-CM | POA: Diagnosis not present

## 2020-10-18 DIAGNOSIS — K7460 Unspecified cirrhosis of liver: Secondary | ICD-10-CM | POA: Diagnosis not present

## 2020-10-18 DIAGNOSIS — R180 Malignant ascites: Secondary | ICD-10-CM | POA: Diagnosis not present

## 2020-10-18 DIAGNOSIS — I10 Essential (primary) hypertension: Secondary | ICD-10-CM | POA: Diagnosis not present

## 2020-10-18 LAB — CBC WITH DIFFERENTIAL/PLATELET
Abs Immature Granulocytes: 0.02 10*3/uL (ref 0.00–0.07)
Basophils Absolute: 0 10*3/uL (ref 0.0–0.1)
Basophils Relative: 0 %
Eosinophils Absolute: 0 10*3/uL (ref 0.0–0.5)
Eosinophils Relative: 0 %
HCT: 37.2 % (ref 36.0–46.0)
Hemoglobin: 11.3 g/dL — ABNORMAL LOW (ref 12.0–15.0)
Immature Granulocytes: 0 %
Lymphocytes Relative: 3 %
Lymphs Abs: 0.3 10*3/uL — ABNORMAL LOW (ref 0.7–4.0)
MCH: 26.5 pg (ref 26.0–34.0)
MCHC: 30.4 g/dL (ref 30.0–36.0)
MCV: 87.1 fL (ref 80.0–100.0)
Monocytes Absolute: 0.4 10*3/uL (ref 0.1–1.0)
Monocytes Relative: 5 %
Neutro Abs: 7.4 10*3/uL (ref 1.7–7.7)
Neutrophils Relative %: 92 %
Platelets: 249 10*3/uL (ref 150–400)
RBC: 4.27 MIL/uL (ref 3.87–5.11)
RDW: 17.6 % — ABNORMAL HIGH (ref 11.5–15.5)
WBC: 8.1 10*3/uL (ref 4.0–10.5)
nRBC: 0 % (ref 0.0–0.2)

## 2020-10-18 LAB — COMPREHENSIVE METABOLIC PANEL
ALT: 19 U/L (ref 0–44)
AST: 34 U/L (ref 15–41)
Albumin: 3.1 g/dL — ABNORMAL LOW (ref 3.5–5.0)
Alkaline Phosphatase: 88 U/L (ref 38–126)
Anion gap: 13 (ref 5–15)
BUN: 52 mg/dL — ABNORMAL HIGH (ref 8–23)
CO2: 18 mmol/L — ABNORMAL LOW (ref 22–32)
Calcium: 9.4 mg/dL (ref 8.9–10.3)
Chloride: 107 mmol/L (ref 98–111)
Creatinine, Ser: 2.13 mg/dL — ABNORMAL HIGH (ref 0.44–1.00)
GFR, Estimated: 25 mL/min — ABNORMAL LOW (ref >60)
Glucose, Bld: 98 mg/dL (ref 70–99)
Potassium: 5.2 mmol/L — ABNORMAL HIGH (ref 3.5–5.1)
Sodium: 138 mmol/L (ref 135–145)
Total Bilirubin: 1 mg/dL (ref 0.3–1.2)
Total Protein: 6.1 g/dL — ABNORMAL LOW (ref 6.5–8.1)

## 2020-10-18 LAB — GLUCOSE, CAPILLARY: Glucose-Capillary: 99 mg/dL (ref 70–99)

## 2020-10-18 LAB — PREALBUMIN: Prealbumin: 9.9 mg/dL — ABNORMAL LOW (ref 18–38)

## 2020-10-18 MED ORDER — MIDODRINE HCL 5 MG PO TABS: 5.0000 mg | ORAL_TABLET | Freq: Three times a day (TID) | ORAL | Status: DC

## 2020-10-18 MED ORDER — MIDODRINE HCL 5 MG PO TABS
10.0000 mg | ORAL_TABLET | Freq: Three times a day (TID) | ORAL | Status: DC
  Administered 2020-10-18 – 2020-10-21 (×10): 10 mg via ORAL
  Filled 2020-10-18 (×10): qty 2

## 2020-10-18 NOTE — Plan of Care (Signed)

## 2020-10-18 NOTE — Progress Notes (Signed)
[facility]  PROGRESS NOTE  Beth Sanchez [id number] DOB: 26-Sep-1955 DOA: 10/05/2020 PCP: Pcp, No   Brief HPI:   65 year old female with a history of hypertension, diabetes mellitus type 2, obesity presents with increased abdominal pain, abdominal distention, low back pain, emesis and fatigue, found to have liver cirrhosis with ascites, AKI, hypotension and hyponatremia.  Also positive for SARS COVID 2 RT-PCR.  Started on IV ceftriaxone. Work-up done concerning for peritoneal carcinomatosis with elevated CA125, cytology consistent with malignant cells from paracentesis.  Oncology was consulted. Patient is s/p paracentesis x4, last one on 8/25 Plan for initiation of chemotherapy once BP stable.    Subjective   Denies abdominal pain, vomiting improved with Reglan   Assessment/Plan:   Peritoneal carcinomatosis -Patient presented with abdominal distention, CT abdomen/pelvis and abdominal ultrasound showed large volume ascites -S/p paracentesis x4 -Body fluid culture showed no growth -Cytology consistent with malignant cells -CT chest showed tiny pulmonary nodules no significant mass or malignancy -CA125 elevated at 7281 -CA 19-9 7, CEA 2.2, AFP 1.8 -Patient s/p upper endoscopy which showed small hiatal hernia, reflux and gastritis -Oncology consulted -Oncology recommended transvaginal ultrasound which showed uterine fundal mass likely fibroid, right ovarian minimally complex cystic lesion indeterminate, lack of visualization of left ovary -Plan for starting Taxol chemotherapy once her BP is stable -Colonoscopy attempted, due to poor bowel prep it was canceled  Acute kidney injury/hypotension -Likely due to prerenal azotemia from hypotension in setting of Zestoretic, Jardiance -Urinalysis with trace leukocytes, nitrate negative, negative protein -Zestoretic on hold -Avoid nephrotoxic agents -Renal function is worsening, will increase midodrine to 10 mg 3 times daily -If  creatinine not improving, consider nephrology consultation  Hyperkalemia -Resolved  Hyponatremia -Resolved  Ileus -Patient developed ileus in the hospital -She refused NG tube placement early in the hospitalization -Continue bowel regimen  D-dimer elevation -Likely from COVID-19 infection -Continue DVT prophylaxis  Incidental COVID-19 infection -Positive on 10/05/2020 -O2 sats 95% on 2 L of oxygen -Isolation precautions have been discontinued  Iron-deficiency anemia -Anemia panel with iron level of 19, ferritin of 511. -Hemoglobin currently at 10.1 from 11.7 from 11.9 from 11.3 from 10.9 from 9.7 from 11.6 on admission. -Patient status post EGD 10/08/2020 which showed small hiatal hernia, reflux and gastritis.   -Colonoscopy canceled due to poor bowel prep.   -Status post IV iron 10/08/2020.   -Transfusion threshold hemoglobin < 7.  -GI was following but have signed off.   Scheduled medications:    (feeding supplement) PROSource Plus  30 mL Oral BID BM   Chlorhexidine Gluconate Cloth  6 each Topical Daily   feeding supplement  1 Container Oral Q24H   feeding supplement  237 mL Oral Q24H   fludrocortisone  0.2 mg Oral BID   heparin  5,000 Units Subcutaneous Q12H   lidocaine  15 mL Oral Once   metoCLOPramide (REGLAN) injection  10 mg Intravenous Q8H   midodrine  5 mg Oral TID WC   multivitamin with minerals  1 tablet Oral Daily   PACLitaxel  140 mg/m2 (Treatment Plan Recorded) Intravenous Once   pantoprazole (PROTONIX) IV  40 mg Intravenous Q12H   senna-docusate  1 tablet Oral BID         Data Reviewed:   CBG:  Recent Labs  Lab 10/11/20 2135 10/12/20 0742 10/12/20 0839 10/12/20 1404 10/12/20 1722  GLUCAP 93 78 88 86 81    SpO2: 98 % O2 Flow Rate (L/min): 2 L/min    Vitals:   10/17/20 2141  10/18/20 0813 10/18/20 1216 10/18/20 1656  BP: (!) 85/63 90/65 (!) 84/66 100/77  Pulse: (!) 104 99 91 (!) 104  Resp: 19     Temp: 97.6 F (36.4 C)  97.8 F  (36.6 C)   TempSrc: Oral  Axillary   SpO2: 98% 99% 98%   Weight:      Height:         Intake/Output Summary (Last 24 hours) at 10/18/2020 1755 Last data filed at 10/18/2020 0800 Gross per 24 hour  Intake 120 ml  Output 400 ml  Net -280 ml    08/26 1901 - 08/28 0700 In: 120 [P.O.:120] Out: -   Filed Weights   10/06/20 0000 10/08/20 1351  Weight: 87.7 kg 87.7 kg    CBC:  Recent Labs  Lab 10/13/20 0715 10/14/20 0745 10/16/20 0324 10/17/20 0400 10/18/20 0414  WBC 6.3 7.3 5.6 6.8 8.1  HGB 10.1* 10.9* 11.0* 11.5* 11.3*  HCT 32.2* 34.8* 35.4* 37.4 37.2  PLT 198 240 207 239 249  MCV 85.6 85.5 86.3 86.8 87.1  MCH 26.9 26.8 26.8 26.7 26.5  MCHC 31.4 31.3 31.1 30.7 30.4  RDW 16.7* 17.1* 17.4* 17.6* 17.6*  LYMPHSABS 0.3* 0.3* 0.3* 0.3* 0.3*  MONOABS 0.3 0.4 0.4 0.4 0.4  EOSABS 0.0 0.0 0.0 0.0 0.0  BASOSABS 0.0 0.0 0.0 0.0 0.0    Complete metabolic panel:  Recent Labs  Lab 10/12/20 0349 10/13/20 0715 10/14/20 0745 10/16/20 0324 10/17/20 0400 10/18/20 0414  NA 137 137 140 143 143 138  K 4.9 4.7 4.3 4.5 4.9 5.2*  CL 108 108 109 112* 112* 107  CO2 19* 17* 18* 22 19* 18*  GLUCOSE 92 70 103* 92 86 98  BUN 40* 36* 32* 38* 45* 52*  CREATININE 1.38* 1.24* 1.27* 1.68* 1.92* 2.13*  CALCIUM 9.7 9.8 9.4 9.8 9.3 9.4  AST  --   --   --  20 27 34  ALT  --   --   --  '9 13 19  '$ ALKPHOS  --   --   --  76 88 88  BILITOT  --   --   --  0.8 1.0 1.0  ALBUMIN  --  3.9 3.3* 3.6 3.4* 3.1*  MG 2.0  --   --  2.3  --   --     No results for input(s): LIPASE, AMYLASE in the last 168 hours.  No results for input(s): CRP, DDIMER, BNP, PROCALCITON, SARSCOV2NAA in the last 168 hours.  Invalid input(s): LACTICACID  ------------------------------------------------------------------------------------------------------------------ No results for input(s): CHOL, HDL, LDLCALC, TRIG, CHOLHDL, LDLDIRECT in the last 72 hours.  Lab Results  Component Value Date   HGBA1C 5.9 (H) 10/07/2020    ------------------------------------------------------------------------------------------------------------------ No results for input(s): TSH, T4TOTAL, T3FREE, THYROIDAB in the last 72 hours.  Invalid input(s): FREET3 ------------------------------------------------------------------------------------------------------------------ No results for input(s): VITAMINB12, FOLATE, FERRITIN, TIBC, IRON, RETICCTPCT in the last 72 hours.  Coagulation profile No results for input(s): INR, PROTIME in the last 168 hours. No results for input(s): DDIMER in the last 72 hours.  Cardiac Enzymes No results for input(s): CKTOTAL, CKMB, CKMBINDEX, TROPONINI in the last 168 hours.  ------------------------------------------------------------------------------------------------------------------    Component Value Date/Time   BNP 89.7 10/07/2020 0925     Antibiotics: Anti-infectives (From admission, onward)    Start     Dose/Rate Route Frequency Ordered Stop   10/06/20 1200  cefTRIAXone (ROCEPHIN) 2 g in sodium chloride 0.9 % 100 mL IVPB  Status:  Discontinued  2 g 200 mL/hr over 30 Minutes Intravenous Every 24 hours 10/05/20 1718 10/08/20 2121   10/05/20 1145  cefTRIAXone (ROCEPHIN) 2 g in sodium chloride 0.9 % 100 mL IVPB        2 g 200 mL/hr over 30 Minutes Intravenous  Once 10/05/20 1140 10/05/20 1224   10/05/20 1145  metroNIDAZOLE (FLAGYL) IVPB 500 mg        500 mg 100 mL/hr over 60 Minutes Intravenous  Once 10/05/20 1140 10/05/20 1339        Radiology Reports  DG Abd Portable 1V  Result Date: 10/17/2020 CLINICAL DATA:  Abdominal distension, abdominal pain. History of pancreatitis. EXAM: PORTABLE ABDOMEN - 1 VIEW COMPARISON:  Plain films of the abdomen dated 10/15/2020 an 10/09/2020. FINDINGS: No dilated large or small bowel loops are seen. No evidence of free intraperitoneal air is seen. Ill-defined airspace opacity at the LEFT lung base, likely atelectasis. Degenerative  spondylosis of the slightly scoliotic thoracolumbar spine, mild to moderate in degree. No acute-appearing osseous abnormality. IMPRESSION: 1. Nonobstructive bowel gas pattern. 2. Probable atelectasis at the LEFT lung base. Electronically Signed   By: Franki Cabot M.D.   On: 10/17/2020 10:58      DVT prophylaxis: Heparin  Code Status: Full code  Family Communication: No family at bedside   Consultants: Gastroenterology: Dr. Lyndel Safe 10/06/2020 Oncology: Dr. Marin Olp 10/09/2020    Procedures: Renal ultrasound 10/05/2020 Right upper quadrant abdominal ultrasound 10/05/2020 Chest x-ray 10/05/2020 CT abdomen and pelvis 10/05/2020 2D echo 10/06/2020 Ultrasound-guided paracentesis -3.3 L of hazy dark amber fluid removed per IR, Darrell Allred, PA 10/07/2020 Ultrasound-guided paracentesis 10/05/2020 per Darrell Allred, PA IR with 3.5 L of hazy fluid removed. Upper endoscopy 10/08/2020 CT chest 10/07/2020 Ultrasound-guided paracentesis--5.3 L of clear yellow fluid removed 10/12/2020 per IR, Ascencion Dike, PA Port-A-Cath placement  10/13/2020 Paracentesis 8/25       Objective    Physical Examination:  General-appears in no acute distress Heart-S1-S2, regular, no murmur auscultated Lungs-clear to auscultation bilaterally, no wheezing or crackles auscultated Abdomen-soft, nontender, no organomegaly, distended Extremities-1+ edema in the lower extremities Neuro-alert, oriented x3, no focal deficit noted  Status is: Inpatient  Dispo: The patient is from: Home              Anticipated d/c is to: Home              Anticipated d/c date is: 10/20/2020              Patient currently not stable for discharge   COVID-19 Labs  No results for input(s): DDIMER, FERRITIN, LDH, CRP in the last 72 hours.  Lab Results  Component Value Date   SARSCOV2NAA POSITIVE (A) 10/05/2020    Microbiology  No results found for this or any previous visit (from the past 240 hour(s)).       Oswald Hillock   [facility] If 7PM-7AM, please contact night-coverage at [email or web address], Office  [phone]   10/18/2020, 5:55 PM  LOS: 13 days

## 2020-10-19 ENCOUNTER — Inpatient Hospital Stay (HOSPITAL_COMMUNITY): Payer: Medicare Other

## 2020-10-19 DIAGNOSIS — R188 Other ascites: Secondary | ICD-10-CM | POA: Diagnosis not present

## 2020-10-19 DIAGNOSIS — N179 Acute kidney failure, unspecified: Secondary | ICD-10-CM | POA: Diagnosis not present

## 2020-10-19 DIAGNOSIS — R180 Malignant ascites: Secondary | ICD-10-CM | POA: Diagnosis not present

## 2020-10-19 DIAGNOSIS — K7460 Unspecified cirrhosis of liver: Secondary | ICD-10-CM | POA: Diagnosis not present

## 2020-10-19 LAB — COMPREHENSIVE METABOLIC PANEL
ALT: 22 U/L (ref 0–44)
AST: 36 U/L (ref 15–41)
Albumin: 3.1 g/dL — ABNORMAL LOW (ref 3.5–5.0)
Alkaline Phosphatase: 99 U/L (ref 38–126)
Anion gap: 13 (ref 5–15)
BUN: 62 mg/dL — ABNORMAL HIGH (ref 8–23)
CO2: 20 mmol/L — ABNORMAL LOW (ref 22–32)
Calcium: 9.4 mg/dL (ref 8.9–10.3)
Chloride: 103 mmol/L (ref 98–111)
Creatinine, Ser: 3.05 mg/dL — ABNORMAL HIGH (ref 0.44–1.00)
GFR, Estimated: 16 mL/min — ABNORMAL LOW (ref >60)
Glucose, Bld: 99 mg/dL (ref 70–99)
Potassium: 5.5 mmol/L — ABNORMAL HIGH (ref 3.5–5.1)
Sodium: 136 mmol/L (ref 135–145)
Total Bilirubin: 0.8 mg/dL (ref 0.3–1.2)
Total Protein: 6.3 g/dL — ABNORMAL LOW (ref 6.5–8.1)

## 2020-10-19 LAB — CBC WITH DIFFERENTIAL/PLATELET
Abs Immature Granulocytes: 0.03 10*3/uL (ref 0.00–0.07)
Basophils Absolute: 0 10*3/uL (ref 0.0–0.1)
Basophils Relative: 0 %
Eosinophils Absolute: 0 10*3/uL (ref 0.0–0.5)
Eosinophils Relative: 0 %
HCT: 37.4 % (ref 36.0–46.0)
Hemoglobin: 11.4 g/dL — ABNORMAL LOW (ref 12.0–15.0)
Immature Granulocytes: 0 %
Lymphocytes Relative: 3 %
Lymphs Abs: 0.2 10*3/uL — ABNORMAL LOW (ref 0.7–4.0)
MCH: 26.5 pg (ref 26.0–34.0)
MCHC: 30.5 g/dL (ref 30.0–36.0)
MCV: 87 fL (ref 80.0–100.0)
Monocytes Absolute: 0.3 10*3/uL (ref 0.1–1.0)
Monocytes Relative: 5 %
Neutro Abs: 6.1 10*3/uL (ref 1.7–7.7)
Neutrophils Relative %: 92 %
Platelets: 246 10*3/uL (ref 150–400)
RBC: 4.3 MIL/uL (ref 3.87–5.11)
RDW: 17.4 % — ABNORMAL HIGH (ref 11.5–15.5)
WBC: 6.7 10*3/uL (ref 4.0–10.5)
nRBC: 0 % (ref 0.0–0.2)

## 2020-10-19 LAB — GLUCOSE, CAPILLARY
Glucose-Capillary: 93 mg/dL (ref 70–99)
Glucose-Capillary: 98 mg/dL (ref 70–99)
Glucose-Capillary: 88 mg/dL (ref 70–99)

## 2020-10-19 MED ORDER — BISACODYL 10 MG RE SUPP: 10.0000 mg | Freq: Every day | RECTAL | Status: DC | PRN

## 2020-10-19 MED ORDER — LIDOCAINE HCL 1 % IJ SOLN
INTRAMUSCULAR | Status: AC
  Filled 2020-10-19: qty 20

## 2020-10-19 MED ORDER — ALBUMIN HUMAN 25 % IV SOLN
50.0000 g | Freq: Once | INTRAVENOUS | Status: AC
  Administered 2020-10-19: 50 g via INTRAVENOUS
  Filled 2020-10-19: qty 200

## 2020-10-19 MED ORDER — SODIUM CHLORIDE 0.9 % IV SOLN: INTRAVENOUS | Status: DC

## 2020-10-19 MED ORDER — SODIUM ZIRCONIUM CYCLOSILICATE 10 G PO PACK
10.0000 g | PACK | Freq: Every day | ORAL | Status: DC
  Administered 2020-10-19: 10 g via ORAL
  Filled 2020-10-19 (×2): qty 1

## 2020-10-19 NOTE — Progress Notes (Signed)
Patient just urinated 100 ml. Bladder scanned immediately following and showed 192 ml. Will continue to monitor.

## 2020-10-19 NOTE — Procedures (Signed)
Ultrasound-guided therapeutic paracentesis performed yielding 4 liters of straw colored fluid. No immediate complications. EBL is none.

## 2020-10-19 NOTE — Progress Notes (Signed)
Physical Therapy Treatment Patient Details Name: Beth Sanchez MRN: [id number] DOB: 10-20-1955 Today's Date: 10/19/2020    History of Present Illness 65 yo female admitted with ascites, pain, hypotension, peritoneal cancer. Hx of DM, obesity, cirrhosis, COVID    PT Comments    Patient progressing to hallway ambulation and able to demonstrate safe use of RW for the first time with cues.  She was mildly SOB and noted HR max 127 with ambulation.  Needs continued skilled PT to progress mobility, safety and endurance.  Agree with follow up HHPT at d/c.   Follow Up Recommendations  Home health PT     Equipment Recommendations  None recommended by PT    Recommendations for Other Services       Precautions / Restrictions Precautions Precautions: Fall    Mobility  Bed Mobility Overal bed mobility: Modified Independent             General bed mobility comments: Increased time. No assistance given.    Transfers Overall transfer level: Needs assistance Equipment used: None Transfers: Sit to/from Stand Sit to Stand: Supervision         General transfer comment: assist for balance/safety  Ambulation/Gait Ambulation/Gait assistance: Min guard Gait Distance (Feet): 140 Feet Assistive device: Rolling walker (2 wheeled) Gait Pattern/deviations: Step-through pattern;Decreased stride length     General Gait Details: cues for walker use and turning, with assist   Stairs             Wheelchair Mobility    Modified Rankin (Stroke Patients Only)       Balance Overall balance assessment: Needs assistance   Sitting balance-Leahy Scale: Good       Standing balance-Leahy Scale: Fair Standing balance comment: can stand without UE support                            Cognition Arousal/Alertness: Awake/alert Behavior During Therapy: WFL for tasks assessed/performed Overall Cognitive Status: Within Functional Limits for tasks assessed                                         Exercises      General Comments General comments (skin integrity, edema, etc.): HR max 127 with mild SOB      Pertinent Vitals/Pain Faces Pain Scale: No hurt    Home Living                      Prior Function            PT Goals (current goals can now be found in the care plan section) Progress towards PT goals: Progressing toward goals    Frequency    Min 3X/week      PT Plan Current plan remains appropriate    Co-evaluation              AM-PAC PT "6 Clicks" Mobility   Outcome Measure  Help needed turning from your back to your side while in a flat bed without using bedrails?: None Help needed moving from lying on your back to sitting on the side of a flat bed without using bedrails?: None Help needed moving to and from a bed to a chair (including a wheelchair)?: A Little Help needed standing up from a chair using your arms (e.g., wheelchair or bedside chair)?: A Little Help needed to  walk in hospital room?: A Little Help needed climbing 3-5 steps with a railing? : A Little 6 Click Score: 20    End of Session   Activity Tolerance: Patient tolerated treatment well Patient left: in bed;with call bell/phone within reach   PT Visit Diagnosis: Unsteadiness on feet (R26.81);Muscle weakness (generalized) (M62.81)     Time: [id number] PT Time Calculation (min) (ACUTE ONLY): 14 min  Charges:  $Gait Training: 8-22 mins                     Magda Kiel, PT [facility] [id number] Office:[phone] 10/19/2020    [name] 10/19/2020, 2:40 PM

## 2020-10-19 NOTE — Progress Notes (Signed)
Pt was instructed that she is now NPO, no drinks or food because of persistent vomiting but pt said she gets thirsty and that she will still drink if she wants to.

## 2020-10-19 NOTE — Progress Notes (Signed)
Ms. Skogstad really is about the same.  She still is not eating all that much.  Again, the real problem that we have is that her intestines just are not working.  I listened to her intestines this morning I cannot hear any bowel sounds.  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  Now, the problem is that her kidneys are starting to shut down.  Her BUN and creatinine are going up quickly.  I am not sure as to why this would be the case.  Today, her BUN 62 creatinine 3.05.  Her prealbumin was 10.  This is certainly somewhat troublesome.  I just am not sure she is going to be able to eat.  I have her on Reglan.  She says she is passing some gas.  Her sister was with [place] today.  I was able to talk to both him.  I know this is an incredibly difficult situation.  She got carboplatinum as a single agent last Wednesday.  We cannot give the Taxol because of her blood pressure.  If her kidneys are failing, I just do not see any way out of trying to help her.  I told her and her sister that this is going to be a critical week.  Either things will start to improve or she will continue to decline.  If she declines, then I think we are looking at hospice because I doubt that she would survive through September.  I know Ms. Creekmore is trying her best.  She is motivated.  She has good support from her sister.  We just are really "stuck" and that her intestines are not working.  It looks like there is probably more ascites now.  We will have to see about another paracentesis on her.  Her blood pressure is also a problem.  Is still on the low side.  We have her on Florinef to try to help get her blood pressure up.  She is not complain of any pain.  This is at least a positive for [place].  Her vital signs show temperature 97.9.  Pulse 89.  Blood pressure 90/60.  Her lungs are clear.  Cardiac exam regular rate and rhythm.  Abdomen is soft.  There is fluid.  Bowel sounds are  absent.  There is no guarding or rebound tenderness.  There is no obvious abdominal mass.  There is no palpable liver or spleen tip.  Extremity shows no clubbing, cyanosis or edema.  Again, our issue is that she does not have intestinal function.  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  Her kidneys are now beginning to slow down.  Again I am not sure as to why this is.  She probably needs to have an abdominal ultrasound to see what is going on.  She replies have more fluid taken off her abdomen.  I know everybody is trying really hard to help Ms. Yano.  She is doing all that she can do.  She has great support from her sister who is very knowledgeable and very eloquent.  I really had a good time talking with her this morning.  I do appreciate the wonderful care that all the staff on [address] are giving Ms. Nitta.  Lattie Haw,  MD  Romans 8:28

## 2020-10-19 NOTE — Progress Notes (Signed)
Patient continues to refuse the ProSource Protein liquid and is asking to please d/c.

## 2020-10-19 NOTE — Progress Notes (Signed)
PROGRESS NOTE    Beth Sanchez  [id number] DOB: 1955/07/26 DOA: 10/05/2020 PCP: Pcp, No    Chief Complaint  Patient presents with   Abdominal Pain    Vomiting, diarrhea    Brief Narrative:  65 year old F with PMH of HTN, IDDM-2 and obesity presenting with increasing abdominal pain, distention, lower back pain, emesis and fatigue, and found to have liver cirrhosis with ascites, AKI, hypotension, mild hyponatremia, and COVID-19 infection.   Found to have peritoneal carcinomatosis with elevated CA125 and cytology consistent with malignant cells from paracentesis.      Assessment & Plan:   Principal Problem:   Ascites Active Problems:   Essential hypertension   Hyperlipidemia associated with type 2 diabetes mellitus (HCC)   Type 2 diabetes mellitus with hyperglycemia (HCC)   Cirrhosis of liver (HCC)   Cirrhosis (HCC)   Iron deficiency anemia   AKI (acute kidney injury) ([place])   Hyperkalemia   Hyponatremia   Gastritis   Primary peritoneal carcinomatosis (HCC)   Goals of care, counseling/discussion   primary peritoneal carcinoma -Patient presenting with abdominal distention, CT abdomen and pelvis as well as abdominal ultrasound consistent with large volume ascites. -Status post paracentesis x4 -Body fluid cultures with no growth to date with low probability for SBP -Leukocytosis resolved. -Cytology consistent with malignant cells. -CT chest with tiny pulmonary nodules and no significant mass or malignancy noted.   -CA 125 elevated at 7281.   -CA 19-9 at 7, CEA at 2.2, AFP at 1.8  -Patient status post upper endoscopy which showed small hiatal hernia, reflux and gastritis.  -Due to elevated CA 125 levels, concern for malignant ascites likely from ovarian source oncology consulted. -Colonoscopy attempted however due to poor bowel prep was canceled.  -Oncology recommended transvaginal ultrasound which showed a uterine fundal mass likely a fibroid, right ovarian minimally  complex cystic lesion indeterminate, lack of visualization of left ovary. -Oncology recommending initiation of chemotherapy once her BP stable  Acute kidney injury -Likely secondary to prerenal azotemia from hypotension in the setting of Zestoretic, Jardiance. -Urinalysis with trace leukocytes, nitrite negative, negative protein arguing against nephrotic syndrome. -Zestoretic on hold. -Avoid nephrotoxic agents.   -Gentle hydration PRN -now? If she is developing hepatorenal syndrome-- check bladder scan, strict I/Os  Hypotension -In the setting of cirrhosis with ascites and dehydration from GI loss. -BP improved however soft/borderline.   -Continue to hold antihypertensive medications.  -Patient pancultured with blood cultures pending with no growth to date. -trial of midodrine   Hyperkalemia -Resolved  Hyponatremia -resolved  Non-anion gap metabolic acidosis likely from AKI -was placed on bicarb by prior MD- assess daily to see if she still needs    Incidental COVID infection -Patient noted to have tested positive on 10/05/2020. -Patient noted to be vaccinated. -Patient with oxygen sats of 97% on room air.   -No indication for treatment.   -d/c isolation precuations  Non-insulin-dependent diabetes mellitus type 2 -Noted to be on Jardiance and metformin prior to admission which are currently on hold in the setting of AKI. -Hemoglobin A1c 5.9 (10/07/2020)  Iron deficiency anemia -Anemia panel with iron level of 19, ferritin of 511. -Hemoglobin currently at 10.1 from 11.7 from 11.9 from 11.3 from 10.9 from 9.7 from 11.6 on admission. -Patient status post EGD 10/08/2020 which showed small hiatal hernia, reflux and gastritis.   -Colonoscopy canceled due to poor bowel prep.   -Status post IV iron 10/08/2020.   -Transfusion threshold hemoglobin < 7.  -GI was following but have  signed off.   Elevated D-dimer -Felt likely secondary to COVID-19 infection. -Continue DVT prophylaxis.     ?  Ileus -Patient with abdominal distention and hypoactive bowel sounds today..   -Abdominal films show improvement last week -Mobilize. -Patient noted to have refused NG tube early on in the hospitalization and continues to refuse -bowel regimen  Nutrition Status: mild malnutrition Nutrition Problem: Increased nutrient needs Etiology: acute illness, catabolic illness ([id number] infection) Signs/Symptoms: estimated needs Interventions: Boost Breeze, Ensure Surgery, MVI, Prostat    Poor overall prognosis - most concerning is her increasing CR.  Discussed NG Tube at length with patient but she is refusing.  Will get palliative care consult as I suspect she may continue to decline  DVT prophylaxis: Heparin Code Status: Full Disposition:   Status is: Inpatient  Remains inpatient appropriate because:Inpatient level of care appropriate due to severity of illness  Dispo: The patient is from: Home              Anticipated d/c is to: Home              Patient currently is not medically stable to d/c.   Difficult to place patient No       Consultants:  Gastroenterology: Dr. Lyndel Safe 10/06/2020 Oncology: Dr. Marin Olp 10/09/2020  Procedures:  Renal ultrasound 10/05/2020 Right upper quadrant abdominal ultrasound 10/05/2020 Chest x-ray 10/05/2020 CT abdomen and pelvis 10/05/2020 2D echo 10/06/2020 Ultrasound-guided paracentesis -3.3 L of hazy dark amber fluid removed per IR, Darrell Allred, PA 10/07/2020 Ultrasound-guided paracentesis 10/05/2020 per Darrell Allred, PA IR with 3.5 L of hazy fluid removed. Upper endoscopy 10/08/2020 CT chest 10/07/2020 Ultrasound-guided paracentesis--5.3 L of clear yellow fluid removed 10/12/2020 per IR, Ascencion Dike, PA Port-A-Cath placement  10/13/2020 Paracentesis 8/25     Subjective: Vomiting this AM  Objective: Vitals:   10/18/20 2148 10/19/20 0558 10/19/20 1139 10/19/20 1451  BP: 94/62 90/60 (!) 82/56 (!) 88/59  Pulse: (!) 107 89 (!) 102 (!)  109  Resp: '18 18 16 19  '$ Temp: 97.6 F (36.4 C) 97.9 F (36.6 C) 97.7 F (36.5 C) (!) 97.5 F (36.4 C)  TempSrc: Oral Oral  Oral  SpO2: 98% 97% 98% 98%  Weight:      Height:       No intake or output data in the 24 hours ending 10/19/20 1557  Filed Weights   10/06/20 0000 10/08/20 1351  Weight: 87.7 kg 87.7 kg    Examination:   General: Appearance:    Obese female in no acute distress     Lungs:      respirations unlabored  Heart:    Tachycardic.   MS:   All extremities are intact.    Neurologic:   Awake, alert        Data Reviewed: I have personally reviewed following labs and imaging studies  CBC: Recent Labs  Lab 10/14/20 0745 10/16/20 0324 10/17/20 0400 10/18/20 0414 10/19/20 0406  WBC 7.3 5.6 6.8 8.1 6.7  NEUTROABS 6.6 4.9 6.1 7.4 6.1  HGB 10.9* 11.0* 11.5* 11.3* 11.4*  HCT 34.8* 35.4* 37.4 37.2 37.4  MCV 85.5 86.3 86.8 87.1 87.0  PLT 240 207 239 249 0000000    Basic Metabolic Panel: Recent Labs  Lab 10/13/20 0715 10/14/20 0745 10/16/20 0324 10/17/20 0400 10/18/20 0414 10/19/20 0406  NA 137 140 143 143 138 136  K 4.7 4.3 4.5 4.9 5.2* 5.5*  CL 108 109 112* 112* 107 103  CO2 17* 18* 22 19* 18* 20*  GLUCOSE 70 103* 92 86 98 99  BUN 36* 32* 38* 45* 52* 62*  CREATININE 1.24* 1.27* 1.68* 1.92* 2.13* 3.05*  CALCIUM 9.8 9.4 9.8 9.3 9.4 9.4  MG  --   --  2.3  --   --   --   PHOS 3.4 2.8  --   --   --   --     GFR: Estimated Creatinine Clearance: 20.9 mL/min (A) (by C-G formula based on SCr of 3.05 mg/dL (H)).  Liver Function Tests: Recent Labs  Lab 10/14/20 0745 10/16/20 0324 10/17/20 0400 10/18/20 0414 10/19/20 0406  AST  --  20 27 34 36  ALT  --  '9 13 19 22  '$ ALKPHOS  --  76 88 88 99  BILITOT  --  0.8 1.0 1.0 0.8  PROT  --  6.0* 6.2* 6.1* 6.3*  ALBUMIN 3.3* 3.6 3.4* 3.1* 3.1*    CBG: Recent Labs  Lab 10/12/20 1722 10/18/20 2145 10/19/20 0830 10/19/20 1239  GLUCAP 81 99 93 98     No results found for this or any previous  visit (from the past 240 hour(s)).         Radiology Studies: No results found.      Scheduled Meds:  (feeding supplement) PROSource Plus  30 mL Oral BID BM   Chlorhexidine Gluconate Cloth  6 each Topical Daily   feeding supplement  1 Container Oral Q24H   feeding supplement  237 mL Oral Q24H   fludrocortisone  0.2 mg Oral BID   heparin  5,000 Units Subcutaneous Q12H   lidocaine  15 mL Oral Once   metoCLOPramide (REGLAN) injection  10 mg Intravenous Q8H   midodrine  10 mg Oral TID WC   multivitamin with minerals  1 tablet Oral Daily   PACLitaxel  140 mg/m2 (Treatment Plan Recorded) Intravenous Once   pantoprazole (PROTONIX) IV  40 mg Intravenous Q12H   senna-docusate  1 tablet Oral BID   sodium zirconium cyclosilicate  10 g Oral Daily   Continuous Infusions:  sodium chloride     sodium chloride     albumin human        LOS: 14 days    Time spent: 35 minutes    Geradine Girt, DO [facility]   To contact the attending provider between 7A-7P or the covering provider during after hours 7P-7A, please log into the web site [email or web address] and access using universal [place] password for that web site. If you do not have the password, please call the hospital operator.  10/19/2020, 3:57 PM

## 2020-10-20 ENCOUNTER — Inpatient Hospital Stay (HOSPITAL_COMMUNITY): Payer: Medicare Other

## 2020-10-20 DIAGNOSIS — R180 Malignant ascites: Secondary | ICD-10-CM | POA: Diagnosis not present

## 2020-10-20 LAB — CBC WITH DIFFERENTIAL/PLATELET
Abs Immature Granulocytes: 0.06 10*3/uL (ref 0.00–0.07)
Basophils Absolute: 0 10*3/uL (ref 0.0–0.1)
Basophils Relative: 0 %
Eosinophils Absolute: 0 10*3/uL (ref 0.0–0.5)
Eosinophils Relative: 0 %
HCT: 33 % — ABNORMAL LOW (ref 36.0–46.0)
Hemoglobin: 10.4 g/dL — ABNORMAL LOW (ref 12.0–15.0)
Immature Granulocytes: 1 %
Lymphocytes Relative: 3 %
Lymphs Abs: 0.2 10*3/uL — ABNORMAL LOW (ref 0.7–4.0)
MCH: 26.9 pg (ref 26.0–34.0)
MCHC: 31.5 g/dL (ref 30.0–36.0)
MCV: 85.3 fL (ref 80.0–100.0)
Monocytes Absolute: 0.2 10*3/uL (ref 0.1–1.0)
Monocytes Relative: 5 %
Neutro Abs: 4.2 10*3/uL (ref 1.7–7.7)
Neutrophils Relative %: 91 %
Platelets: 183 10*3/uL (ref 150–400)
RBC: 3.87 MIL/uL (ref 3.87–5.11)
RDW: 17.2 % — ABNORMAL HIGH (ref 11.5–15.5)
WBC: 4.6 10*3/uL (ref 4.0–10.5)
nRBC: 0 % (ref 0.0–0.2)

## 2020-10-20 LAB — COMPREHENSIVE METABOLIC PANEL
ALT: 19 U/L (ref 0–44)
AST: 30 U/L (ref 15–41)
Albumin: 3.2 g/dL — ABNORMAL LOW (ref 3.5–5.0)
Alkaline Phosphatase: 86 U/L (ref 38–126)
Anion gap: 11 (ref 5–15)
BUN: 56 mg/dL — ABNORMAL HIGH (ref 8–23)
CO2: 19 mmol/L — ABNORMAL LOW (ref 22–32)
Calcium: 9 mg/dL (ref 8.9–10.3)
Chloride: 105 mmol/L (ref 98–111)
Creatinine, Ser: 2.53 mg/dL — ABNORMAL HIGH (ref 0.44–1.00)
GFR, Estimated: 21 mL/min — ABNORMAL LOW (ref >60)
Glucose, Bld: 79 mg/dL (ref 70–99)
Potassium: 4.4 mmol/L (ref 3.5–5.1)
Sodium: 135 mmol/L (ref 135–145)
Total Bilirubin: 1 mg/dL (ref 0.3–1.2)
Total Protein: 5.7 g/dL — ABNORMAL LOW (ref 6.5–8.1)

## 2020-10-20 LAB — GLUCOSE, CAPILLARY
Glucose-Capillary: 68 mg/dL — ABNORMAL LOW (ref 70–99)
Glucose-Capillary: 69 mg/dL — ABNORMAL LOW (ref 70–99)
Glucose-Capillary: 79 mg/dL (ref 70–99)
Glucose-Capillary: 93 mg/dL (ref 70–99)

## 2020-10-20 MED ORDER — DEXTROSE-NACL 5-0.45 % IV SOLN: INTRAVENOUS | Status: DC

## 2020-10-20 NOTE — Progress Notes (Signed)
BP 70/53 map 60. Notified provider on call. Will continue to monitor.

## 2020-10-20 NOTE — Progress Notes (Signed)
Beth Sanchez had 4 L of ascites removed yesterday.  She had the ultrasound of her kidneys.  This showed maybe a little bit of left hydronephrosis.  However, I do not think this is all that significant.  There are no labs back yet this morning.  Her blood pressure still is on the low side.  She is on Florinef.  She is on midodrine.  She still is not eating all that much.  She really needs to be on full liquids in my opinion.  I realize that she may get sick with this.  However, I think that if she could have a little bit of food with taste, this will make her feel better.  She is not complaining of any pain.  Again, we really need to see what the labs look like to see what her kidney function is doing.  It has now been 6 days since she has had the carboplatinum.  I will check a CA-125 on her tomorrow.  I realize that this is can be a very difficult problem.  I think if her kidney function does not improve, then we really are not going to be able to give her any more therapy.  If we cannot get her blood pressure better, I still think that we are going to be stuck not be able to treat her.  The 1 possibility is that using Avastin, which often raises blood pressure, might be a useful intervention.  I just hate that we are "stuck" right now not really able to do anything for her.  This might just be how it is.  Sometimes, despite all of our best efforts, we just are not able to treat somebody's malignancy.  When listened to her abdomen, I may hear a couple bowel sounds but this is tenuous.  Her blood pressure this morning was 71/53.  Again, I just want to try to give her a chance.  Again, I think that we will know this week whether or not we will be able to help her or if we need to think about focusing on her comfort.  I know the staff on [address] are doing a fantastic job with her.  This is very complicated.   Lattie Haw, MD  Hebrews 12:12

## 2020-10-20 NOTE — Progress Notes (Signed)
PROGRESS NOTE    Beth Sanchez  [id number] DOB: 1955-11-17 DOA: 10/05/2020 PCP: Pcp, No    Chief Complaint  Patient presents with   Abdominal Pain    Vomiting, diarrhea    Brief Narrative:  65 year old F with PMH of HTN, IDDM-2 and obesity presenting with increasing abdominal pain, distention, lower back pain, emesis and fatigue, and found to have liver cirrhosis with ascites, AKI, hypotension, mild hyponatremia, and COVID-19 infection.   Found to have peritoneal carcinomatosis with elevated CA125 and cytology consistent with malignant cells from paracentesis.  Not improving.        Assessment & Plan:   Principal Problem:   Ascites Active Problems:   Essential hypertension   Hyperlipidemia associated with type 2 diabetes mellitus (HCC)   Type 2 diabetes mellitus with hyperglycemia (HCC)   Cirrhosis of liver (HCC)   Cirrhosis (HCC)   Iron deficiency anemia   AKI (acute kidney injury) ([place])   Hyperkalemia   Hyponatremia   Gastritis   Primary peritoneal carcinomatosis (HCC)   Goals of care, counseling/discussion   primary peritoneal carcinoma -Patient presenting with abdominal distention, CT abdomen and pelvis as well as abdominal ultrasound consistent with large volume ascites. -Status post paracentesis x4 -Body fluid cultures with no growth to date with low probability for SBP -Leukocytosis resolved. -Cytology consistent with malignant cells. -CT chest with tiny pulmonary nodules and no significant mass or malignancy noted.   -CA 125 elevated at 7281.   -CA 19-9 at 7, CEA at 2.2, AFP at 1.8  -Patient status post upper endoscopy which showed small hiatal hernia, reflux and gastritis.  -Due to elevated CA 125 levels, concern for malignant ascites likely from ovarian source oncology consulted. -Colonoscopy attempted however due to poor bowel prep was canceled.  -Oncology recommended transvaginal ultrasound which showed a uterine fundal mass likely a fibroid, right  ovarian minimally complex cystic lesion indeterminate, lack of visualization of left ovary. -Oncology recommending initiation of chemotherapy once her BP stable  Acute kidney injury -Zestoretic on hold. -Avoid nephrotoxic agents.   -Gentle hydration PRN -now? If she is developing hepatorenal syndrome-- check bladder scan, strict I/Os  Hypotension -In the setting of cirrhosis with ascites and dehydration from GI loss. -Continue to hold antihypertensive medications.  -Patient pancultured with blood cultures pending with no growth to date. -trial of midodrine /florinef  Hyperkalemia -Resolved  Hyponatremia -resolved   Incidental COVID infection -Patient noted to have tested positive on 10/05/2020. -Patient noted to be vaccinated. -Patient with oxygen sats of 97% on room air.   -No indication for treatment.   -d/c isolation precuations  Non-insulin-dependent diabetes mellitus type 2 -Noted to be on Jardiance and metformin prior to admission which are currently on hold in the setting of AKI. -Hemoglobin A1c 5.9 (10/07/2020) -now hypoglycemic  Iron deficiency anemia -Anemia panel with iron level of 19, ferritin of 511. -Hemoglobin currently at 10.1 from 11.7 from 11.9 from 11.3 from 10.9 from 9.7 from 11.6 on admission. -Patient status post EGD 10/08/2020 which showed small hiatal hernia, reflux and gastritis.   -Colonoscopy canceled due to poor bowel prep.   -Status post IV iron 10/08/2020.   -Transfusion threshold hemoglobin < 7.  -GI was following but have signed off.  Hypoglycemia -D5 IVF   Elevated D-dimer -Felt likely secondary to COVID-19 infection. -Continue DVT prophylaxis.    ?  Ileus -Mobilize. -Patient noted to have refused NG tube early on in the hospitalization and continues to refuse -bowel regimen -bowel sounds high pitched  and concern with continued vomiting  Nutrition Status: mild malnutrition Nutrition Problem: Increased nutrient needs Etiology: acute  illness, catabolic illness ([id number] infection) Signs/Symptoms: estimated needs Interventions: Boost Breeze, Ensure Surgery, MVI, Prostat    Poor overall prognosis.  Discussed NG Tube at length with patient but she is refusing.  Reached out to Dr. Marin Olp to discuss possible TPN for nutrition.  Will get palliative care consult as I suspect she may continue to decline  DVT prophylaxis: Heparin Code Status: Full Disposition:  Spoke with friend at bedside  Status is: Inpatient  Remains inpatient appropriate because:Inpatient level of care appropriate due to severity of illness  Dispo: The patient is from: Home              Anticipated d/c is to: Home              Patient currently is not medically stable to d/c.   Difficult to place patient No       Consultants:  Gastroenterology: Dr. Lyndel Safe 10/06/2020 Oncology: Dr. Marin Olp 10/09/2020 Palliative care  Procedures:  Renal ultrasound 10/05/2020 Right upper quadrant abdominal ultrasound 10/05/2020 Chest x-ray 10/05/2020 CT abdomen and pelvis 10/05/2020 2D echo 10/06/2020 Ultrasound-guided paracentesis -3.3 L of hazy dark amber fluid removed per IR, Darrell Allred, PA 10/07/2020 Ultrasound-guided paracentesis 10/05/2020 per Darrell Allred, PA IR with 3.5 L of hazy fluid removed. Upper endoscopy 10/08/2020 CT chest 10/07/2020 Ultrasound-guided paracentesis--5.3 L of clear yellow fluid removed 10/12/2020 per IR, Ascencion Dike, PA Port-A-Cath placement  10/13/2020 Paracentesis 8/25     Subjective: Continues to vomit but also insisting to eat  Objective: Vitals:   10/19/20 2106 10/20/20 0608 10/20/20 0747 10/20/20 1300  BP: 92/62 (!) 71/53 (!) 88/58 (!) 84/60  Pulse: 95 89  88  Resp: 20   14  Temp: 97.7 F (36.5 C) 97.7 F (36.5 C)  97.6 F (36.4 C)  TempSrc: Oral   Oral  SpO2: 100% 100%  98%  Weight:      Height:        Intake/Output Summary (Last 24 hours) at 10/20/2020 1332 Last data filed at 10/20/2020 1104 Gross per 24  hour  Intake 1421.28 ml  Output 500 ml  Net 921.28 ml    Filed Weights   10/06/20 0000 10/08/20 1351  Weight: 87.7 kg 87.7 kg    Examination:   General: Appearance:    Obese female who appears  uncomfortable   High pitched bowel sounds  Lungs:     respirations unlabored  Heart:    Normal heart rate.    MS:   All extremities are intact.    Neurologic:   Awake, alert, poor insight         Data Reviewed: I have personally reviewed following labs and imaging studies  CBC: Recent Labs  Lab 10/16/20 0324 10/17/20 0400 10/18/20 0414 10/19/20 0406 10/20/20 0659  WBC 5.6 6.8 8.1 6.7 4.6  NEUTROABS 4.9 6.1 7.4 6.1 4.2  HGB 11.0* 11.5* 11.3* 11.4* 10.4*  HCT 35.4* 37.4 37.2 37.4 33.0*  MCV 86.3 86.8 87.1 87.0 85.3  PLT 207 239 249 246 [id number]    Basic Metabolic Panel: Recent Labs  Lab 10/14/20 0745 10/16/20 0324 10/17/20 0400 10/18/20 0414 10/19/20 0406 10/20/20 0659  NA 140 143 143 138 136 135  K 4.3 4.5 4.9 5.2* 5.5* 4.4  CL 109 112* 112* 107 103 105  CO2 18* 22 19* 18* 20* 19*  GLUCOSE 103* 92 86 98 99 79  BUN 32* 38* 45*  52* 62* 56*  CREATININE 1.27* 1.68* 1.92* 2.13* 3.05* 2.53*  CALCIUM 9.4 9.8 9.3 9.4 9.4 9.0  MG  --  2.3  --   --   --   --   PHOS 2.8  --   --   --   --   --     GFR: Estimated Creatinine Clearance: 25.2 mL/min (A) (by C-G formula based on SCr of 2.53 mg/dL (H)).  Liver Function Tests: Recent Labs  Lab 10/16/20 0324 10/17/20 0400 10/18/20 0414 10/19/20 0406 10/20/20 0659  AST 20 27 34 36 30  ALT '9 13 19 22 19  '$ ALKPHOS 76 88 88 99 86  BILITOT 0.8 1.0 1.0 0.8 1.0  PROT 6.0* 6.2* 6.1* 6.3* 5.7*  ALBUMIN 3.6 3.4* 3.1* 3.1* 3.2*    CBG: Recent Labs  Lab 10/19/20 0830 10/19/20 1239 10/19/20 1842 10/20/20 0819 10/20/20 1134  GLUCAP 93 98 88 68* 69*     No results found for this or any previous visit (from the past 240 hour(s)).         Radiology Studies: US RENAL  Result Date: 10/19/2020 CLINICAL DATA:  Renal  failure EXAM: RENAL / URINARY TRACT ULTRASOUND COMPLETE COMPARISON:  10/05/2020 FINDINGS: Right Kidney: Renal measurements: 9.8 x 5.4 x 5.5 cm = volume: 151 mL. Echogenicity within normal limits. No mass or hydronephrosis visualized. Left Kidney: Renal measurements: 11.0 x 4.9 x 3.3 cm = volume: 150 mL. Echogenicity within normal limits. No mass. Mild left hydronephrosis. Bladder: Appears normal for degree of bladder distention. Other: Ascites in the low abdomen. IMPRESSION: 1. Mild left hydronephrosis. No calculi or other obstructing etiology identified. 2. Ascites. Electronically Signed   By: Eddie Candle M.D.   On: 10/19/2020 19:05   US Paracentesis  Result Date: 10/19/2020 INDICATION: Patient with history of peritoneal carcinomatosis with recurrent ascites. Request is for therapeutic paracentesis. EXAM: ULTRASOUND GUIDED THERAPEUTIC PARACENTESIS MEDICATIONS: LIDOCAINE 1% 10 ML COMPLICATIONS: None immediate. PROCEDURE: Informed written consent was obtained from the patient after a discussion of the risks, benefits and alternatives to treatment. A timeout was performed prior to the initiation of the procedure. Initial ultrasound scanning demonstrates a large amount of ascites within the right lower abdominal quadrant. The right lower abdomen was prepped and draped in the usual sterile fashion. 1% lidocaine was used for local anesthesia. Following this, a 19 gauge, 7-cm, Yueh catheter was introduced. An ultrasound image was saved for documentation purposes. The paracentesis was performed. The catheter was removed and a dressing was applied. The patient tolerated the procedure well without immediate post procedural complication. FINDINGS: A total of approximately 4 L of straw-colored fluid was removed. IMPRESSION: Successful ultrasound-guided therapeutic paracentesis yielding 4 liters of peritoneal fluid. Read by: Rushie Nyhan, NP Electronically Signed   By: Michaelle Birks M.D.   On: 10/19/2020 16:52         Scheduled Meds:  (feeding supplement) PROSource Plus  30 mL Oral BID BM   Chlorhexidine Gluconate Cloth  6 each Topical Daily   feeding supplement  1 Container Oral Q24H   feeding supplement  237 mL Oral Q24H   fludrocortisone  0.2 mg Oral BID   heparin  5,000 Units Subcutaneous Q12H   lidocaine  15 mL Oral Once   metoCLOPramide (REGLAN) injection  10 mg Intravenous Q8H   midodrine  10 mg Oral TID WC   multivitamin with minerals  1 tablet Oral Daily   PACLitaxel  140 mg/m2 (Treatment Plan Recorded) Intravenous Once  pantoprazole (PROTONIX) IV  40 mg Intravenous Q12H   senna-docusate  1 tablet Oral BID   sodium zirconium cyclosilicate  10 g Oral Daily   Continuous Infusions:  sodium chloride     dextrose 5 % and 0.45% NaCl 75 mL/hr at 10/20/20 1150      LOS: 15 days    Time spent: 35 minutes    Geradine Girt, DO [facility]   To contact the attending provider between 7A-7P or the covering provider during after hours 7P-7A, please log into the web site [email or web address] and access using universal [place] password for that web site. If you do not have the password, please call the hospital operator.  10/20/2020, 1:32 PM

## 2020-10-21 ENCOUNTER — Encounter: Payer: Self-pay | Admitting: *Deleted

## 2020-10-21 DIAGNOSIS — E875 Hyperkalemia: Secondary | ICD-10-CM | POA: Diagnosis not present

## 2020-10-21 DIAGNOSIS — Z515 Encounter for palliative care: Secondary | ICD-10-CM

## 2020-10-21 DIAGNOSIS — R1110 Vomiting, unspecified: Secondary | ICD-10-CM | POA: Diagnosis not present

## 2020-10-21 DIAGNOSIS — N179 Acute kidney failure, unspecified: Secondary | ICD-10-CM | POA: Diagnosis not present

## 2020-10-21 DIAGNOSIS — R180 Malignant ascites: Secondary | ICD-10-CM | POA: Diagnosis not present

## 2020-10-21 HISTORY — DX: Encounter for palliative care: Z51.5

## 2020-10-21 LAB — CBC WITH DIFFERENTIAL/PLATELET
Abs Immature Granulocytes: 0.05 10*3/uL (ref 0.00–0.07)
Basophils Absolute: 0 10*3/uL (ref 0.0–0.1)
Basophils Relative: 0 %
Eosinophils Absolute: 0 10*3/uL (ref 0.0–0.5)
Eosinophils Relative: 0 %
HCT: 34.5 % — ABNORMAL LOW (ref 36.0–46.0)
Hemoglobin: 10.9 g/dL — ABNORMAL LOW (ref 12.0–15.0)
Immature Granulocytes: 1 %
Lymphocytes Relative: 4 %
Lymphs Abs: 0.2 10*3/uL — ABNORMAL LOW (ref 0.7–4.0)
MCH: 26.9 pg (ref 26.0–34.0)
MCHC: 31.6 g/dL (ref 30.0–36.0)
MCV: 85.2 fL (ref 80.0–100.0)
Monocytes Absolute: 0.3 10*3/uL (ref 0.1–1.0)
Monocytes Relative: 6 %
Neutro Abs: 4.2 10*3/uL (ref 1.7–7.7)
Neutrophils Relative %: 89 %
Platelets: 208 10*3/uL (ref 150–400)
RBC: 4.05 MIL/uL (ref 3.87–5.11)
RDW: 17.2 % — ABNORMAL HIGH (ref 11.5–15.5)
WBC: 4.7 10*3/uL (ref 4.0–10.5)
nRBC: 0 % (ref 0.0–0.2)

## 2020-10-21 LAB — COMPREHENSIVE METABOLIC PANEL
ALT: 17 U/L (ref 0–44)
AST: 30 U/L (ref 15–41)
Albumin: 3.1 g/dL — ABNORMAL LOW (ref 3.5–5.0)
Alkaline Phosphatase: 97 U/L (ref 38–126)
Anion gap: 8 (ref 5–15)
BUN: 54 mg/dL — ABNORMAL HIGH (ref 8–23)
CO2: 21 mmol/L — ABNORMAL LOW (ref 22–32)
Calcium: 8.9 mg/dL (ref 8.9–10.3)
Chloride: 108 mmol/L (ref 98–111)
Creatinine, Ser: 2.35 mg/dL — ABNORMAL HIGH (ref 0.44–1.00)
GFR, Estimated: 22 mL/min — ABNORMAL LOW (ref >60)
Glucose, Bld: 120 mg/dL — ABNORMAL HIGH (ref 70–99)
Potassium: 4.2 mmol/L (ref 3.5–5.1)
Sodium: 137 mmol/L (ref 135–145)
Total Bilirubin: 0.8 mg/dL (ref 0.3–1.2)
Total Protein: 5.9 g/dL — ABNORMAL LOW (ref 6.5–8.1)

## 2020-10-21 LAB — GLUCOSE, CAPILLARY: Glucose-Capillary: 122 mg/dL — ABNORMAL HIGH (ref 70–99)

## 2020-10-21 MED ORDER — HYDROMORPHONE HCL 1 MG/ML IJ SOLN
0.5000 mg | Freq: Four times a day (QID) | INTRAMUSCULAR | Status: DC | PRN
  Administered 2020-10-21 – 2020-10-22 (×4): 0.5 mg via INTRAVENOUS
  Filled 2020-10-21: qty 1
  Filled 2020-10-21: qty 0.5
  Filled 2020-10-21: qty 1
  Filled 2020-10-21: qty 0.5

## 2020-10-21 MED ORDER — LACTATED RINGERS IV BOLUS: 500.0000 mL | Freq: Once | INTRAVENOUS | Status: DC

## 2020-10-21 MED ORDER — ENSURE ENLIVE PO LIQD: 237.0000 mL | Freq: Three times a day (TID) | ORAL | Status: DC

## 2020-10-21 NOTE — Progress Notes (Addendum)
Patient nauseous and vomiting and very short of breath (RR 31-41). Already given zofran and too early to give compazine. Pain also has pain 10/10 and can't give diluadid until 0000. She appears the same as she does right before she needs to have a paracentesis. Paged provider on call.    Patient has also been urinating small amounts for a couple of days now.  I bladder scanned patient and it showed 576m. Patient is very weak and states that she is too weak to get OOB to go to bathroom. Notified provider on call. New order to insert urethral catheter. Placed foley and will continue to monitor.   Also, administered scheduled IV reglan. The foley placement and IV reglan seemed to help with the nausea and SOB (RR 20 now).   At midnight, patient called out in pain in nausea. Administered PRN IV compazine and PRN diluadid.   Patient resting comfortably at this time. Will continue to monitor.

## 2020-10-21 NOTE — Progress Notes (Signed)
Received notification from [employer] that due to tissue insufficiency tmolecular studies can not be performed. Oncomap testing request cancelled.   Oncology Nurse Navigator Documentation  Oncology Nurse Navigator Flowsheets 10/21/2020  Navigator Follow Up Date: -  Navigator Follow Up Reason: -  Navigator Location [facility]  Navigator Encounter Type Molecular Studies  Treatment Phase Pre-Tx/Tx Discussion  Barriers/Navigation Needs Coordination of Care  Interventions Coordination of Care  Acuity Level 2-Minimal Needs (1-2 Barriers Identified)  Coordination of Care Other  Time Spent with Patient 15

## 2020-10-21 NOTE — Progress Notes (Signed)
PROGRESS NOTE    Beth Sanchez  [id number] DOB: September 05, 1955 DOA: 10/05/2020 PCP: Pcp, No    Brief Narrative:  65 year old F with PMH of HTN, IDDM-2 and obesity presenting with increasing abdominal pain, distention, lower back pain, emesis and fatigue, and found to have liver cirrhosis with ascites, AKI, hypotension, mild hyponatremia, and COVID-19 infection.   Found to have peritoneal carcinomatosis with elevated CA125 and cytology consistent with malignant cells from paracentesis.  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       Assessment & Plan:   Principal Problem:   Ascites Active Problems:   Essential hypertension   Hyperlipidemia associated with type 2 diabetes mellitus (HCC)   Type 2 diabetes mellitus with hyperglycemia (HCC)   Cirrhosis of liver (HCC)   Cirrhosis (HCC)   Iron deficiency anemia   AKI (acute kidney injury) ([place])   Hyperkalemia   Hyponatremia   Gastritis   Primary peritoneal carcinomatosis (HCC)   Goals of care, counseling/discussion   primary peritoneal carcinoma -Status post paracentesis x4 -Body fluid cultures with no growth to date with low probability for SBP -Cytology consistent with malignant cells. -CA 125 elevated at 7281.   -Patient status post upper endoscopy which showed small hiatal hernia, reflux and gastritis.  - elevated CA 125 levels -Oncology recommended transvaginal ultrasound which showed a uterine fundal mass likely a fibroid, right ovarian minimally complex cystic lesion indeterminate, lack of visualization of left ovary. -Oncology recommending initiation of chemotherapy and was given 1 dose  Acute kidney injury -Zestoretic on hold. -Avoid nephrotoxic agents.   -Gentle hydration since PO intake is so poor  Hypotension -In the setting of cirrhosis with ascites and dehydration from GI loss. -Continue to hold antihypertensive medications.  -Patient pancultured with blood cultures pending  with no growth to date. -trial of midodrine /florinef  Hyperkalemia -Resolved  Hyponatremia -resolved   Incidental COVID infection -Patient noted to have tested positive on 10/05/2020. -Patient noted to be vaccinated. -Patient with oxygen sats of 97% on room air.   -No indication for treatment.   -d/c isolation precuations  Non-insulin-dependent diabetes mellitus type 2 -Noted to be on Jardiance and metformin prior to admission which are currently on hold in the setting of AKI. -Hemoglobin A1c 5.9 (10/07/2020) -now hypoglycemic  Iron deficiency anemia -Anemia panel with iron level of 19, ferritin of 511. -Hemoglobin currently at 10.1 from 11.7 from 11.9 from 11.3 from 10.9 from 9.7 from 11.6 on admission. -Patient status post EGD 10/08/2020 which showed small hiatal hernia, reflux and gastritis.   -Colonoscopy canceled due to poor bowel prep.   -Status post IV iron 10/08/2020.   -Transfusion threshold hemoglobin < 7.  -GI was following but have signed off.  Hypoglycemia -D5 IVF   Elevated D-dimer -Felt likely secondary to COVID-19 infection. -Continue DVT prophylaxis.    ?  Ileus/malignant pseudoobstruction secondary to this tumor -Mobilize. -Patient noted to have refused NG tube early on in the hospitalization and continues to refuse   Nutrition Status: mild malnutrition Nutrition Problem: Increased nutrient needs Etiology: acute illness, catabolic illness ([id number] infection) Signs/Symptoms: estimated needs Interventions: Boost Breeze, Ensure Surgery, MVI, Prostat    Poor overall prognosis.   Will get palliative care consult as I suspect she may continue to decline  DVT prophylaxis: Heparin Code Status: Full Disposition:  Spoke with friend at bedside 8/30- unable to reach sister on 8/31  Status is: Inpatient  Remains inpatient appropriate because:Inpatient level of care appropriate due to severity  of illness  Dispo: The patient is from: Home               Anticipated d/c is to: Home              Patient currently is not medically stable to d/c.   Difficult to place patient No       Consultants:  Gastroenterology: Dr. Lyndel Safe 10/06/2020 Oncology: Dr. Marin Olp 10/09/2020 Palliative care  Procedures:  Renal ultrasound 10/05/2020 Right upper quadrant abdominal ultrasound 10/05/2020 Chest x-ray 10/05/2020 CT abdomen and pelvis 10/05/2020 2D echo 10/06/2020 Ultrasound-guided paracentesis -3.3 L of hazy dark amber fluid removed per IR, Darrell Allred, PA 10/07/2020 Ultrasound-guided paracentesis 10/05/2020 per Darrell Allred, PA IR with 3.5 L of hazy fluid removed. Upper endoscopy 10/08/2020 CT chest 10/07/2020 Ultrasound-guided paracentesis--5.3 L of clear yellow fluid removed 10/12/2020 per IR, Ascencion Dike, PA Port-A-Cath placement  10/13/2020 Paracentesis 8/25     Subjective: Able to keep a few sips of liquid down  Objective: Vitals:   10/20/20 1300 10/20/20 2113 10/21/20 0022 10/21/20 0542  BP: (!) 84/60 (!) 73/49 (!) 83/56 (!) 87/58  Pulse: 88 89 (!) 102 (!) 104  Resp: 14 20 (!) 25 20  Temp: 97.6 F (36.4 C) 97.9 F (36.6 C) 97.7 F (36.5 C) 98.1 F (36.7 C)  TempSrc: Oral  Oral Oral  SpO2: 98% 97% 93% 99%  Weight:      Height:        Intake/Output Summary (Last 24 hours) at 10/21/2020 1118 Last data filed at 10/21/2020 0354 Gross per 24 hour  Intake 1620.42 ml  Output 375 ml  Net 1245.42 ml    Filed Weights   10/06/20 0000 10/08/20 1351  Weight: 87.7 kg 87.7 kg    Examination:   General: Appearance:    Obese female in no acute distress, vomiting bag in bed as a "precaution"     Lungs:     respirations unlabored  Heart:    Normal heart rate.     MS:   All extremities are intact.    Neurologic:   Awake, alert, poor insight, irritable          Data Reviewed: I have personally reviewed following labs and imaging studies  CBC: Recent Labs  Lab 10/17/20 0400 10/18/20 0414 10/19/20 0406 10/20/20 0659  10/21/20 0426  WBC 6.8 8.1 6.7 4.6 4.7  NEUTROABS 6.1 7.4 6.1 4.2 4.2  HGB 11.5* 11.3* 11.4* 10.4* 10.9*  HCT 37.4 37.2 37.4 33.0* 34.5*  MCV 86.8 87.1 87.0 85.3 85.2  PLT 239 249 246 183 [id number]    Basic Metabolic Panel: Recent Labs  Lab 10/16/20 0324 10/17/20 0400 10/18/20 0414 10/19/20 0406 10/20/20 0659 10/21/20 0426  NA 143 143 138 136 135 137  K 4.5 4.9 5.2* 5.5* 4.4 4.2  CL 112* 112* 107 103 105 108  CO2 22 19* 18* 20* 19* 21*  GLUCOSE 92 86 98 99 79 120*  BUN 38* 45* 52* 62* 56* 54*  CREATININE 1.68* 1.92* 2.13* 3.05* 2.53* 2.35*  CALCIUM 9.8 9.3 9.4 9.4 9.0 8.9  MG 2.3  --   --   --   --   --     GFR: Estimated Creatinine Clearance: 27.1 mL/min (A) (by C-G formula based on SCr of 2.35 mg/dL (H)).  Liver Function Tests: Recent Labs  Lab 10/17/20 0400 10/18/20 0414 10/19/20 0406 10/20/20 0659 10/21/20 0426  AST 27 34 36 30 30  ALT '13 19 22 19 17  '$ ALKPHOS  88 88 99 86 97  BILITOT 1.0 1.0 0.8 1.0 0.8  PROT 6.2* 6.1* 6.3* 5.7* 5.9*  ALBUMIN 3.4* 3.1* 3.1* 3.2* 3.1*    CBG: Recent Labs  Lab 10/20/20 0819 10/20/20 1134 10/20/20 1647 10/20/20 2110 10/21/20 0712  GLUCAP 68* 69* 79 93 122*     No results found for this or any previous visit (from the past 240 hour(s)).         Radiology Studies: US RENAL  Result Date: 10/19/2020 CLINICAL DATA:  Renal failure EXAM: RENAL / URINARY TRACT ULTRASOUND COMPLETE COMPARISON:  10/05/2020 FINDINGS: Right Kidney: Renal measurements: 9.8 x 5.4 x 5.5 cm = volume: 151 mL. Echogenicity within normal limits. No mass or hydronephrosis visualized. Left Kidney: Renal measurements: 11.0 x 4.9 x 3.3 cm = volume: 150 mL. Echogenicity within normal limits. No mass. Mild left hydronephrosis. Bladder: Appears normal for degree of bladder distention. Other: Ascites in the low abdomen. IMPRESSION: 1. Mild left hydronephrosis. No calculi or other obstructing etiology identified. 2. Ascites. Electronically Signed   By: Eddie Candle M.D.   On: 10/19/2020 19:05   US Paracentesis  Result Date: 10/19/2020 INDICATION: Patient with history of peritoneal carcinomatosis with recurrent ascites. Request is for therapeutic paracentesis. EXAM: ULTRASOUND GUIDED THERAPEUTIC PARACENTESIS MEDICATIONS: LIDOCAINE 1% 10 ML COMPLICATIONS: None immediate. PROCEDURE: Informed written consent was obtained from the patient after a discussion of the risks, benefits and alternatives to treatment. A timeout was performed prior to the initiation of the procedure. Initial ultrasound scanning demonstrates a large amount of ascites within the right lower abdominal quadrant. The right lower abdomen was prepped and draped in the usual sterile fashion. 1% lidocaine was used for local anesthesia. Following this, a 19 gauge, 7-cm, Yueh catheter was introduced. An ultrasound image was saved for documentation purposes. The paracentesis was performed. The catheter was removed and a dressing was applied. The patient tolerated the procedure well without immediate post procedural complication. FINDINGS: A total of approximately 4 L of straw-colored fluid was removed. IMPRESSION: Successful ultrasound-guided therapeutic paracentesis yielding 4 liters of peritoneal fluid. Read by: Rushie Nyhan, NP Electronically Signed   By: Michaelle Birks M.D.   On: 10/19/2020 16:52   DG Abd Portable 1V  Result Date: 10/20/2020 CLINICAL DATA:  Follow-up ileus. EXAM: PORTABLE ABDOMEN - 1 VIEW COMPARISON:  Abdominal x-ray 10/17/2020. FINDINGS: There are no dilated large or small bowel loops identified. Residual contrast is seen within the right colon. Stomach is moderately distended. There is no free intraperitoneal air on this supine view. No suspicious calcifications are identified. No acute fractures are seen. IMPRESSION: Nonobstructive, nonspecific bowel gas pattern. Electronically Signed   By: Ronney Asters M.D.   On: 10/20/2020 15:18        Scheduled Meds:  (feeding  supplement) PROSource Plus  30 mL Oral BID BM   Chlorhexidine Gluconate Cloth  6 each Topical Daily   feeding supplement  1 Container Oral Q24H   feeding supplement  237 mL Oral Q24H   fludrocortisone  0.2 mg Oral BID   heparin  5,000 Units Subcutaneous Q12H   lidocaine  15 mL Oral Once   metoCLOPramide (REGLAN) injection  10 mg Intravenous Q8H   midodrine  10 mg Oral TID WC   multivitamin with minerals  1 tablet Oral Daily   PACLitaxel  140 mg/m2 (Treatment Plan Recorded) Intravenous Once   pantoprazole (PROTONIX) IV  40 mg Intravenous Q12H   senna-docusate  1 tablet Oral BID   Continuous Infusions:  sodium chloride     dextrose 5 % and 0.45% NaCl 75 mL/hr at 10/21/20 0354      LOS: 16 days    Time spent: 35 minutes    Geradine Girt, DO [facility]   To contact the attending provider between 7A-7P or the covering provider during after hours 7P-7A, please log into the web site [email or web address] and access using universal [place] password for that web site. If you do not have the password, please call the hospital operator.  10/21/2020, 11:18 AM

## 2020-10-21 NOTE — Progress Notes (Signed)
Chaplain engaged in an initial visit with Beth Sanchez.  Chaplain introduced herself and went over consult for Advanced Directive.  Beth Sanchez would like to go over the paperwork when her sister is present.  Chaplain told Beth Sanchez how to reach her when she is ready.  Chaplain provided support.    10/21/20 1400  Clinical Encounter Type  Visited With Patient  Visit Type Initial;Social support

## 2020-10-21 NOTE — Progress Notes (Signed)
Initial Nutrition Assessment  DOCUMENTATION CODES:  Not applicable  INTERVENTION:  Discontinue ProSource Plus, Boost Breeze, and Ensure Surgery.  Obtain updated weight.  Add Ensure po TID, each supplement provides 350 kcal and 13-20 grams of protein.   Add Magic cup TID with meals, each supplement provides 290 kcal and 9 grams of protein.  Continue MVI with minerals.  NUTRITION DIAGNOSIS:  Increased nutrient needs related to acute illness, catabolic illness ([id number] infection) as evidenced by estimated needs. - ongoing, addressed with supplements  GOAL:  Patient will meet greater than or equal to 90% of their needs - not meeting,  MONITOR:  PO intake, Supplement acceptance, Labs, Weight trends, I & O's  REASON FOR ASSESSMENT:  Malnutrition Screening Tool    ASSESSMENT:  65 year old female with medical history of HTN, type 2 DM, and obesity. She presentd to the ED due to increasing abdominal pain, distention, lower back pain, emesis, and fatigue. She was found to have liver cirrhosis with ascites, AKI, hypotension, mild hyponatremia, and COVID-19 infection. She had IR paracentesis with removal of 3.5 L on 8/15 and repeat paracentesis with removal of 3.3 L on 8/17. 8/23 - IR imaging guided port insertion 8/25 - chemo started  Pt with 1 episode of emesis daily from 8/27 to 8/30.  Per Epic, pt has barely eaten. She has abdominal distention and may soon need another paracentesis.   Pt in need of new weight. RD to order.  Discontinue clear liquid supplements. RD to add Ensure TID and Magic Cup TID.  Supplements: ProSource Plus BID, Boost Breeze daily, Ensure Surgery daily  Medications: reviewed; Reglan TID, midodrine, MVI with minerals, Protonix BID, Senokot BID, D5 with NaCl @ 75 ml/hr, Dilaudid PRN (given 3 times today), Zofran PRN (given once today), compazine PRN (given once today)  Labs: reviewed; CBG 68-122  Diet Order:   Diet Order             Diet full liquid  Room service appropriate? Yes; Fluid consistency: Thin  Diet effective now                  EDUCATION NEEDS: Not appropriate for education at this time  Skin:  Skin Assessment: Reviewed RN Assessment  Last BM:  8/21  Height:  Ht Readings from Last 1 Encounters:  10/08/20 '5\' 7"'$  (1.702 m)   Weight:  Wt Readings from Last 1 Encounters:  10/08/20 87.7 kg   Ideal Body Weight:  61.4 kg  BMI:  Body mass index is 30.28 kg/m.  Estimated Nutritional Needs:  Kcal:  2300-2500 kcal Protein:  120-135 grams Fluid:  >/= 2 L/day  Derrel Nip, RD, LDN (she/her/hers) Registered Dietitian I After-Hours/Weekend Pager # in [phone]

## 2020-10-21 NOTE — Consult Note (Signed)
Consultation Note Date: 10/21/2020   Patient Name: Beth Sanchez  DOB: [birth year]  MRN: [id number]  Age / Sex: 65 y.o., female  PCP: Pcp, No Referring Physician: Geradine Girt, DO  Reason for Consultation: Establishing goals of care  HPI/Patient Profile: 65 y.o. female  with past medical history of HTN, DMII, HLD, and obesity  admitted on 10/05/2020 with abdominal pain, distention, low back pain, emesis, and fatigue.  On admission, patient found to have liver cirrhosis with ascites, AKI, hypotension, mild hyponatermia, and COVID-19.  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 She is being followed by Dr. Martha Clan. She had 1 dose of carboplatinum without improvement. Chemotherapy is on hold given patient's hypotensive state. She is unable to tolerate PO intake without N/V. She has not had a bowel movement since 8/21. She is ambulating with assistance of rolling walker. She remains AAOx4 and a full code. Palliative Care was consulted for [place] discussion.  Clinical Assessment and Goals of Care: I have reviewed medical records including EPIC notes, labs and imaging, received report from bedside RN Gregary Signs, assessed the patient and then met with patient at bedside to discuss diagnosis prognosis, [place], EOL wishes, disposition and options.  I introduced Palliative Medicine as specialized medical care for people living with serious illness. It focuses on providing relief from the symptoms and stress of a serious illness. The goal is to improve quality of life for both the patient and the family.  We discussed a brief life review of the patient. She shared she lives with her older sister and 5 cats. She formerly worked as a Scientist, water quality and has enjoyed going to [employer] in the past. The patient reports she relies on her  sister Beth Sanchez to stay up to date on the patient's health and to make decisions. The patient repeatedly said she "will take it one day at a time".   Even though the patient is alert and oriented and has capacity to make decisions, the patient shared that she prefers that her medical updates be discussed with her sister Beth Sanchez. She reports she has been having pain and discomfort for about 6 months. She also says she is "tired of hearing about her bowels". With the patient's permission, I called and spoke with her sister Beth Sanchez.  Beth Sanchez shares that [name] has been declining for the past 6 months but refusing to seek medical treatment. [name] called EMS and had Esha brought to the hospital for this admission.   As far as functional status, according to [facility], PTA patient was ambulating and able to perform ADLs without complication.  As far as nutritional status, according to [name], patient's appetite has been decreasing over the last 6 months with a weight loss of 40-50 pounds.   [name] and I discussed the patient's current illness, which Beth Sanchez is not clear on what exactly is going on with her sister. She reports no one has called to give her an update. Beth Sanchez is frustrated that  she does not have a clear understanding of the patient's diagnosis and prognosis. She recognizes that [name] has capacity to make her own decisions, but that Beth Sanchez also has difficulty processing and interpreting what is going on with her health.  I shared that [name]'s cancer is aggressive and that we are awaiting oncology's final recommendations. I conveyed that perhaps these answers will help Beth Sanchez and Danniel guide their decisions as far as aggressive medical treatment versus a pathway of comfort, quality of life, and dignity.  Even though the patient says she signed documents, no documents are on file in [place] says she cannot find and does not recall ever seeing ACP documents, like a living will or HCPOA, for the  patient.   I attempted to elicit values and goals of care important to the patient. Beth Sanchez shares that she is unsure what [name]'s wishes are as far as code status, resuscitation, and aggressive medical interventions.  Discussed with patient and [name] the importance of continued conversation with family and the medical providers regarding overall plan of care and treatment options, ensuring decisions are within the context of the patient's values and GOCs.    Questions and concerns were addressed.   A family meeting with [name] and the patient is scheduled for tomorrow, Thursday, September 1, at 1030am.    Primary Decision Maker PATIENT  Code Status/Advance Care Planning: Full code  Prognosis:   Unable to determine  Discharge Planning: To Be Determined  Primary Diagnoses: Present on Admission:  Cirrhosis ([place])  Type 2 diabetes mellitus with hyperglycemia ([place])  Hyperlipidemia associated with type 2 diabetes mellitus ([place])  Essential hypertension  Ascites  Iron deficiency anemia  Gastritis   Physical Exam Vitals and nursing note reviewed.  Constitutional:      General: She is not in acute distress. HENT:     Head: Normocephalic.  Cardiovascular:     Rate and Rhythm: Normal rate.  Pulmonary:     Effort: Pulmonary effort is normal.  Abdominal:     Tenderness: There is generalized abdominal tenderness.  Skin:    General: Skin is warm and dry.  Neurological:     Mental Status: She is alert and oriented to person, place, and time.  Psychiatric:        Mood and Affect: Mood normal. Mood is not anxious.        Behavior: Behavior normal.    Vital Signs: BP (!) 67/42 (BP Location: Right Arm)   Pulse 75   Temp 97.6 F (36.4 C) (Axillary)   Resp 12   Ht _0  (1.702 m)   Wt 87.7 kg   SpO2 98%   BMI 30.28 kg/m  Pain Scale: 0-10 POSS *See Group Information*: S-Acceptable,Sleep, easy to arouse Pain Score: 8  SpO2: SpO2: 98 % O2 Device:SpO2: 98 % O2 Flow Rate: .O2  Flow Rate (L/min): 2 L/min  Palliative Assessment/Data: 70%     I discussed this patient's plan of care with the patient, patient's sister, bedside RN.  Thank you for this consult. Palliative medicine will continue to follow and assist holistically.   Time Total: 70 minutes Greater than 50%  of this time was spent counseling and coordinating care related to the above assessment and plan.  Signed by: Loistine Chance MD, patient was seen along with Jordan Hawks, DNP, FNP-BC Palliative Medicine    Please contact Palliative Medicine Team phone at [phone] for questions and concerns.  For individual provider: See Shea Evans

## 2020-10-21 NOTE — Progress Notes (Signed)
Physical Therapy Treatment Patient Details Name: Beth Sanchez MRN: [id number] DOB: 26-Mar-1955 Today's Date: 10/21/2020    History of Present Illness 65 yo female admitted with ascites, pain, hypotension, peritoneal cancer. Hx of DM, obesity, cirrhosis, COVID    PT Comments    Pt is slowly progressing toward acute PT goals this session with increased ambulation distance. Pt ambulated ~120f with MIN guard-supervision and use of RW, no overt LOB observed. HR MAX during ambulation 119bpm with recovery to 90bpm with seated rest following ambulation. Pt was limited with further ambulation distance this session due to decreased activity tolerance and reported  LE weakness.  Pt will benefit from continued skilled PT to increase independence and maximize safety with mobility.       Follow Up Recommendations  Home health PT     Equipment Recommendations  None recommended by PT    Recommendations for Other Services       Precautions / Restrictions Precautions Precautions: Fall Restrictions Weight Bearing Restrictions: No    Mobility  Bed Mobility Overal bed mobility: Modified Independent Bed Mobility: Sit to Sidelying         Sit to sidelying: Modified independent (Device/Increase time) General bed mobility comments: Increased time. No phsyical assist needed. Pt in bathroom with care partner present upon entry.    Transfers Overall transfer level: Needs assistance Equipment used: None Transfers: Sit to/from Stand Sit to Stand: Supervision         General transfer comment: Supervision for sit to stand from EOB without use of assistive device  Ambulation/Gait Ambulation/Gait assistance: Min guard;Supervision Gait Distance (Feet): 12 Feet Assistive device: None;Rolling walker (2 wheeled) Gait Pattern/deviations: Step-through pattern;Decreased stride length Gait velocity: decr   General Gait Details: Pt ambulated from bathroom to sitting EOB with no assistive device  and supervision. Following seated break, pt ambulated 1963fwith use of RW with MIN guard progressing to close supervision, cues provided to maintain close proximity to RW and to keep RW in contact withfloor at all times during turns. HR MAX 119bpm, recovery to 90bpm at EOS.   Stairs             Wheelchair Mobility    Modified Rankin (Stroke Patients Only)       Balance Overall balance assessment: Needs assistance   Sitting balance-Leahy Scale: Good     Standing balance support: No upper extremity supported;Bilateral upper extremity supported;During functional activity Standing balance-Leahy Scale: Fair Standing balance comment: pt able to stand and ambulate short distances without use of UE support                            Cognition Arousal/Alertness: Awake/alert Behavior During Therapy: WFL for tasks assessed/performed Overall Cognitive Status: Within Functional Limits for tasks assessed                                        Exercises      General Comments        Pertinent Vitals/Pain Pain Assessment: No/denies pain Pain Intervention(s): Monitored during session    Home Living                      Prior Function            PT Goals (current goals can now be found in the care plan section) Acute Rehab PT  Goals Patient Stated Goal: to feel better PT Goal Formulation: With patient Time For Goal Achievement: 10/31/20 Potential to Achieve Goals: Good Progress towards PT goals: Progressing toward goals    Frequency    Min 3X/week      PT Plan Current plan remains appropriate    Co-evaluation              AM-PAC PT "6 Clicks" Mobility   Outcome Measure  Help needed turning from your back to your side while in a flat bed without using bedrails?: None Help needed moving from lying on your back to sitting on the side of a flat bed without using bedrails?: None Help needed moving to and from a bed to a  chair (including a wheelchair)?: A Little Help needed standing up from a chair using your arms (e.g., wheelchair or bedside chair)?: A Little Help needed to walk in hospital room?: A Little Help needed climbing 3-5 steps with a railing? : A Little 6 Click Score: 20    End of Session Equipment Utilized During Treatment: Gait belt Activity Tolerance: Patient tolerated treatment well Patient left: in bed;with call bell/phone within reach;with bed alarm set Nurse Communication: Mobility status PT Visit Diagnosis: Unsteadiness on feet (R26.81);Muscle weakness (generalized) (M62.81)     Time: [id number] PT Time Calculation (min) (ACUTE ONLY): 16 min  Charges:  $Gait Training: 8-22 mins                     Festus Barren PT, DPT  Acute Rehabilitation Services  Office [phone]   10/21/2020, 12:30 PM

## 2020-10-21 NOTE — Progress Notes (Signed)
I think were still in the same situation as we have been for the past few days.  She still really is not able to eat.  Her abdomen is silent on examination.  She had the abdominal x-ray yesterday which did not show any obstruction.  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  It has been a week now since she had chemotherapy.  Granted, she only had the carboplatinum.  However, I would think that the carboplatinum would have done something for [place] by now.  Her blood pressure is still on the low side.  It is not as low.  Her renal function is a little better.  Her BUN is 54 creatinine 2.35.  She is urinating.  There is no bowel movement.  She is not really having any problems with pain.  There is no cough or shortness of breath.  Her white cell count is 4.7.  Hemoglobin 10.9.  Platelet count 208,000.  On her examination, her abdomen is still soft on exam.  She does have the ascites.  I am sure this is reaccumulating.  She probably will need another paracentesis soon.  I am going to send off a CA125 on her.  We will see what this shows.  I know that she is trying hard.  I just hate the fact that she is not able to really eat.  I do appreciate the wonderful care she is getting from all the staff up on [address].  Lattie Haw, MD  Romans 8:31

## 2020-10-22 ENCOUNTER — Inpatient Hospital Stay (HOSPITAL_COMMUNITY): Payer: Medicare Other

## 2020-10-22 DIAGNOSIS — N179 Acute kidney failure, unspecified: Secondary | ICD-10-CM | POA: Diagnosis not present

## 2020-10-22 DIAGNOSIS — U071 COVID-19: Secondary | ICD-10-CM | POA: Diagnosis not present

## 2020-10-22 DIAGNOSIS — R579 Shock, unspecified: Secondary | ICD-10-CM

## 2020-10-22 DIAGNOSIS — J9601 Acute respiratory failure with hypoxia: Secondary | ICD-10-CM | POA: Diagnosis not present

## 2020-10-22 DIAGNOSIS — Z515 Encounter for palliative care: Secondary | ICD-10-CM

## 2020-10-22 DIAGNOSIS — Z66 Do not resuscitate: Secondary | ICD-10-CM

## 2020-10-22 DIAGNOSIS — J189 Pneumonia, unspecified organism: Secondary | ICD-10-CM

## 2020-10-22 DIAGNOSIS — A419 Sepsis, unspecified organism: Secondary | ICD-10-CM | POA: Diagnosis not present

## 2020-10-22 DIAGNOSIS — R6521 Severe sepsis with septic shock: Secondary | ICD-10-CM

## 2020-10-22 DIAGNOSIS — C786 Secondary malignant neoplasm of retroperitoneum and peritoneum: Principal | ICD-10-CM

## 2020-10-22 DIAGNOSIS — R180 Malignant ascites: Secondary | ICD-10-CM | POA: Diagnosis not present

## 2020-10-22 HISTORY — DX: Shock, unspecified: R57.9

## 2020-10-22 HISTORY — DX: Do not resuscitate: Z66

## 2020-10-22 HISTORY — DX: Encounter for palliative care: Z51.5

## 2020-10-22 LAB — MRSA NEXT GEN BY PCR, NASAL: MRSA by PCR Next Gen: NOT DETECTED

## 2020-10-22 LAB — COMPREHENSIVE METABOLIC PANEL
ALT: 19 U/L (ref 0–44)
AST: 28 U/L (ref 15–41)
Albumin: 2.8 g/dL — ABNORMAL LOW (ref 3.5–5.0)
Alkaline Phosphatase: 105 U/L (ref 38–126)
Anion gap: 10 (ref 5–15)
BUN: 48 mg/dL — ABNORMAL HIGH (ref 8–23)
CO2: 19 mmol/L — ABNORMAL LOW (ref 22–32)
Calcium: 8.8 mg/dL — ABNORMAL LOW (ref 8.9–10.3)
Chloride: 108 mmol/L (ref 98–111)
Creatinine, Ser: 2.22 mg/dL — ABNORMAL HIGH (ref 0.44–1.00)
GFR, Estimated: 24 mL/min — ABNORMAL LOW (ref >60)
Glucose, Bld: 123 mg/dL — ABNORMAL HIGH (ref 70–99)
Potassium: 4.3 mmol/L (ref 3.5–5.1)
Sodium: 137 mmol/L (ref 135–145)
Total Bilirubin: 1 mg/dL (ref 0.3–1.2)
Total Protein: 5.5 g/dL — ABNORMAL LOW (ref 6.5–8.1)

## 2020-10-22 LAB — CA 125: Cancer Antigen (CA) 125: 6490 U/mL — ABNORMAL HIGH (ref 0.0–38.1)

## 2020-10-22 LAB — CBC WITH DIFFERENTIAL/PLATELET
Abs Immature Granulocytes: 0.02 10*3/uL (ref 0.00–0.07)
Basophils Absolute: 0 10*3/uL (ref 0.0–0.1)
Basophils Relative: 1 %
Eosinophils Absolute: 0 10*3/uL (ref 0.0–0.5)
Eosinophils Relative: 0 %
HCT: 34.9 % — ABNORMAL LOW (ref 36.0–46.0)
Hemoglobin: 11.3 g/dL — ABNORMAL LOW (ref 12.0–15.0)
Immature Granulocytes: 1 %
Lymphocytes Relative: 2 %
Lymphs Abs: 0.1 10*3/uL — ABNORMAL LOW (ref 0.7–4.0)
MCH: 27 pg (ref 26.0–34.0)
MCHC: 32.4 g/dL (ref 30.0–36.0)
MCV: 83.3 fL (ref 80.0–100.0)
Monocytes Absolute: 0.1 10*3/uL (ref 0.1–1.0)
Monocytes Relative: 2 %
Neutro Abs: 4 10*3/uL (ref 1.7–7.7)
Neutrophils Relative %: 94 %
Platelets: 200 10*3/uL (ref 150–400)
RBC: 4.19 MIL/uL (ref 3.87–5.11)
RDW: 17.2 % — ABNORMAL HIGH (ref 11.5–15.5)
WBC: 4.2 10*3/uL (ref 4.0–10.5)
nRBC: 0 % (ref 0.0–0.2)

## 2020-10-22 LAB — LACTIC ACID, PLASMA: Lactic Acid, Venous: 0.9 mmol/L (ref 0.5–1.9)

## 2020-10-22 LAB — PROCALCITONIN: Procalcitonin: 0.5 ng/mL

## 2020-10-22 MED ORDER — LACTATED RINGERS IV BOLUS
500.0000 mL | Freq: Once | INTRAVENOUS | Status: AC
  Administered 2020-10-22: 500 mL via INTRAVENOUS

## 2020-10-22 MED ORDER — MIDODRINE HCL 5 MG PO TABS
10.0000 mg | ORAL_TABLET | Freq: Three times a day (TID) | ORAL | Status: DC
  Administered 2020-10-22: 10 mg via ORAL
  Filled 2020-10-22: qty 2

## 2020-10-22 MED ORDER — THIAMINE HCL 100 MG/ML IJ SOLN: 100.0000 mg | Freq: Every day | INTRAMUSCULAR | Status: DC

## 2020-10-22 MED ORDER — ORAL CARE MOUTH RINSE
15.0000 mL | Freq: Two times a day (BID) | OROMUCOSAL | Status: DC
  Administered 2020-10-23 – 2020-11-09 (×21): 15 mL via OROMUCOSAL

## 2020-10-22 MED ORDER — SODIUM CHLORIDE 0.9 % IV SOLN
2.0000 g | INTRAVENOUS | Status: DC
  Administered 2020-10-22: 2 g via INTRAVENOUS
  Filled 2020-10-22: qty 2

## 2020-10-22 MED ORDER — METHYLPREDNISOLONE SODIUM SUCC 125 MG IJ SOLR
125.0000 mg | Freq: Two times a day (BID) | INTRAMUSCULAR | Status: DC
  Administered 2020-10-22: 125 mg via INTRAVENOUS
  Filled 2020-10-22: qty 2

## 2020-10-22 MED ORDER — SODIUM CHLORIDE 0.9% FLUSH: 10.0000 mL | INTRAVENOUS | Status: DC | PRN

## 2020-10-22 MED ORDER — CHLORHEXIDINE GLUCONATE CLOTH 2 % EX PADS: 6.0000 | MEDICATED_PAD | Freq: Every day | CUTANEOUS | Status: DC

## 2020-10-22 MED ORDER — LACTATED RINGERS IV BOLUS
1000.0000 mL | Freq: Once | INTRAVENOUS | Status: AC
  Administered 2020-10-22: 1000 mL via INTRAVENOUS

## 2020-10-22 MED ORDER — FLUDROCORTISONE ACETATE 0.1 MG PO TABS
0.2000 mg | ORAL_TABLET | Freq: Two times a day (BID) | ORAL | Status: DC
  Administered 2020-10-22 – 2020-11-01 (×9): 0.2 mg via ORAL
  Filled 2020-10-22 (×24): qty 2

## 2020-10-22 MED ORDER — SODIUM CHLORIDE 0.9% FLUSH
10.0000 mL | Freq: Two times a day (BID) | INTRAVENOUS | Status: DC
  Administered 2020-10-22 – 2020-10-26 (×10): 10 mL
  Administered 2020-10-27: 40 mL
  Administered 2020-10-27 – 2020-11-09 (×18): 10 mL

## 2020-10-22 MED ORDER — VANCOMYCIN HCL 2000 MG/400ML IV SOLN
2000.0000 mg | Freq: Once | INTRAVENOUS | Status: AC
  Administered 2020-10-22: 2000 mg via INTRAVENOUS
  Filled 2020-10-22: qty 400

## 2020-10-22 MED ORDER — NOREPINEPHRINE 4 MG/250ML-% IV SOLN
0.0000 ug/min | INTRAVENOUS | Status: DC
  Administered 2020-10-22: 2 ug/min via INTRAVENOUS
  Administered 2020-10-23: 3 ug/min via INTRAVENOUS
  Administered 2020-10-24: 5 ug/min via INTRAVENOUS
  Administered 2020-10-25: 4 ug/min via INTRAVENOUS
  Administered 2020-10-26 (×2): 2 ug/min via INTRAVENOUS
  Filled 2020-10-22 (×6): qty 250

## 2020-10-22 MED ORDER — HYDROMORPHONE HCL 1 MG/ML IJ SOLN
0.5000 mg | INTRAMUSCULAR | Status: AC | PRN
  Administered 2020-10-22 – 2020-10-23 (×6): 0.5 mg via INTRAVENOUS
  Filled 2020-10-22 (×6): qty 1

## 2020-10-22 MED ORDER — SODIUM CHLORIDE 0.9 % IV SOLN
12.5000 mg | Freq: Four times a day (QID) | INTRAVENOUS | Status: DC | PRN
  Filled 2020-10-22: qty 0.5

## 2020-10-22 MED ORDER — ALBUMIN HUMAN 25 % IV SOLN: 25.0000 g | Freq: Once | INTRAVENOUS | Status: DC

## 2020-10-22 MED ORDER — ALBUMIN HUMAN 5 % IV SOLN
25.0000 g | Freq: Once | INTRAVENOUS | Status: AC
  Administered 2020-10-22: 25 g via INTRAVENOUS
  Filled 2020-10-22: qty 500

## 2020-10-22 MED ORDER — ALBUMIN HUMAN 25 % IV SOLN
50.0000 g | Freq: Once | INTRAVENOUS | Status: DC
  Administered 2020-10-22: 50 g via INTRAVENOUS

## 2020-10-22 MED ORDER — VANCOMYCIN HCL 1500 MG/300ML IV SOLN: 1500.0000 mg | INTRAVENOUS | Status: DC

## 2020-10-22 NOTE — Progress Notes (Signed)
Palliative Care Progress Note, Assessment & Plan   Patient Name: Beth Sanchez       Date: 10/22/2020 DOB: [birth year]  Age: 65 y.o. MRN#: [id number] Attending Physician: Margaretha Seeds, MD Primary Care Physician: Pcp, No Admit Date: 10/05/2020  Reason for Consultation/Follow-up: Establishing goals of care  HPI: 65 y.o. female  with past medical history of HTN, DMII, HLD, and obesity  admitted on 10/05/2020 with abdominal pain, distention, low back pain, emesis, and fatigue.  On admission, patient found to have liver cirrhosis with ascites, AKI, hypotension, mild hyponatermia, and COVID-19.   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 She is being followed by Dr. Martha Clan. She had 1 dose of carboplatinum without improvement. Chemotherapy is on hold given patient's hypotensive state. She is unable to tolerate PO intake without N/V. She has not had a bowel movement since 8/21. She is ambulating with assistance of rolling walker. She remains AAOx4 and is now a DNR after discussions with Dr. Jarrett Ables this morning. He also suggested a percutaneous catheter to relieve ascites.  Plan of Care: I have reviewed medical records including EPIC notes, labs and imaging, received report from bedside RN Ubaldo Glassing, assessed the patient and then met with patient and patient's sister [name] to discuss diagnosis prognosis, GOC, EOL wishes, disposition and options.  I spoke with [name] yesterday and she is familiar with Palliative Medicine's role in the hospital. We discussed patient's current illness and what it means in the larger context of patient's on-going co-morbidities. The patient expressed that she knows she is dying.  She understands that he form of cancer is aggressive and that no treatment options are able to stop the natural disease progression.  I outlined the difference between an aggressive medical plan and a comfort pathway. I reviewed that the comfort plan of care focused on quality of life, not quantity, dignity, and symptom management. Natural disease trajectory and expectations at EOL were discussed. Both the patient and [name] understand that [place]'s time is short. They are in agreement to moving to a comfort path and ideally to [facility] when medically stable.   I reviewed that she cannot go to a Hospice Inpatient facility with pressors. She is receiving Levophed at the moment. We agreed to attempt weaning her from these medications over the next 24H-48H. I reviewed that if we are not able to ween her off of these medications then they will need to be stopped to allow a natural death and not prolong suffering. She was in agreement to attempt weaning for the next 24-48H and see how she progresses.  I also shared that a comfort pathway involved unrestricted visitors, halting lab draws, use of IV antibiotics, and other medical interventions that are not strictly focused on comfort.  Consideration is being given to placement of a peritoneal catheter for the patient's ascites.  She  would like it placed. Pleurex cath placement by IR scheduled for tomorrow, 9/2. The patient is already receiving Levophed for BP support.     Both the patient and her sister are fully aware of the gravity of the patient's illness. They are hopeful she can  ween off of pressors and transfer to [place]. Palliative Medicine will continue to follow this patient and evaluate for Hospice IPU options when appropriate.   Questions and concerns were addressed. The family was encouraged to call with questions or concerns.   Code Status: DNR  Prognosis:  < 2 weeks  Discharge Planning: Hospice  facility  Recommendations/Plan: Focus on comfort - no more aggressive medical interventions or escalation of care Wean Levo and oxygen - if possible Likely anticipated hospital death versus residential hospice, PMT to follow.   Care plan was discussed with CCM, bedside RN, charge RN, pharmacist  Length of Stay: 17  Physical Exam Constitutional:      General: She is not in acute distress.    Appearance: She is not ill-appearing.  HENT:     Head: Normocephalic and atraumatic.  Cardiovascular:     Rate and Rhythm: Normal rate.  Pulmonary:     Effort: Pulmonary effort is normal.  Abdominal:     General: Bowel sounds are absent.     Palpations: Abdomen is soft. There is no fluid wave.     Tenderness: There is generalized abdominal tenderness.  Skin:    General: Skin is warm and dry.  Neurological:     Mental Status: She is alert and oriented to person, place, and time.  Psychiatric:        Mood and Affect: Mood normal.        Behavior: Behavior normal.            Vital Signs: BP (!) 87/51   Pulse 80   Temp 97.6 F (36.4 C) (Oral)   Resp 13   Ht 5' 7" (1.702 m)   Wt 87.7 kg   SpO2 100%   BMI 30.28 kg/m  SpO2: SpO2: 100 % O2 Device: O2 Device: Nasal Cannula O2 Flow Rate: O2 Flow Rate (L/min): 4 L/min      Palliative Assessment/Data: 20%       Total Time 50 minutes Prolonged Time Billed  no   Greater than 50%  of this time was spent counseling and coordinating care related to the above assessment and plan.  Thank you for allowing the Palliative Medicine Team to assist in the care of this patient.  Loistine Chance MD. Patient was additionally seen alongside Curt Bears L. Ilsa Iha, FNP-BC Palliative Medicine Team Team Phone # [phone]

## 2020-10-22 NOTE — Progress Notes (Signed)
Patient reports finding lost belongings in her personal bag, this includes her credit cards and a pair of eyeglasses.   Her sister at bedside will be taking the belongings home.

## 2020-10-22 NOTE — Progress Notes (Signed)
Pharmacy Antibiotic Note  Beth Sanchez is a 65 y.o. female admitted on 10/05/2020 now with sepsis.  Pharmacy has been consulted for Cefepime & Vancomycin dosing. AKI noted.  Plan: Cefepime 2gm IV q24h Vancomycin 2gm IV x1 loading dose then '1500mg'$  IV q48h to target AUC 400-550 Check Vancomycin levels at steady state Monitor renal function and cx data   Height: '5\' 7"'$  (170.2 cm) Weight: 87.7 kg (193 lb 5.5 oz) IBW/kg (Calculated) : 61.6  Temp (24hrs), Avg:98.1 F (36.7 C), Min:97.6 F (36.4 C), Max:98.8 F (37.1 C)  Recent Labs  Lab 10/18/20 0414 10/19/20 0406 10/20/20 0659 10/21/20 0426 10/22/20 0315  WBC 8.1 6.7 4.6 4.7 4.2  CREATININE 2.13* 3.05* 2.53* 2.35* 2.22*    Estimated Creatinine Clearance: 28.7 mL/min (A) (by C-G formula based on SCr of 2.22 mg/dL (H)).    No Known Allergies  Antimicrobials this admission: 9/1 Vancomycin >>  9/1 Cefepime >>   Dose adjustments this admission:  Microbiology results: 9/1 BCx:    Thank you for allowing pharmacy to be a part of this patient's care.  Netta Cedars PharmD 10/22/2020 4:15 AM

## 2020-10-22 NOTE — Progress Notes (Signed)
    OVERNIGHT PROGRESS REPORT  Ms. Lecker is a 65 y/o woman with a history of HTN and DM who presented to the hospital on 10/05/20 with back pain, abdominal distention, abdominal pain, nausea, vomiting, and fatigue.  Progressive symptoms with ongoing deterioration for approximately the last 6 months .   She was diagnosed with COVID and found to have cirrhosis with ascites and peritoneal carcinomatosis.  She has undergone 5 paracenteses this admission.  She was hypotensive and after receiving carboplatin last week.   She was started on midodrine earlier today without improvement.  Hypotension has worsened tonight unaffected by bolus fluids    She had large amounts emesis at the beginning of the shift and limited intake.  She is now transferred to ICU.  She has been responsive throughout. Oncology is already onboard   CCM has been consulted due to pressor requirements and increasing complexity of treatment.   Gershon Cull MSNA MSN ACNPC-AG Acute Care Nurse Practitioner [place]

## 2020-10-22 NOTE — Progress Notes (Signed)
Per patient's request, called her sister, [name], to give an update on her status and location room 1239. [name] stated that she and her sister have a meeting with the palliative care team today at [place]. I shared with her that the meeting would most likely still be held.   I also left her my number in case she had any questions for me.

## 2020-10-22 NOTE — Progress Notes (Signed)
NAME:  Beth Sanchez, MRN:  [id number], DOB:  [birth year], LOS: 58 ADMISSION DATE:  10/05/2020  CONSULTATION DATE: 10/22/2020 REFERRING MD:  Eliseo Squires - [facility] CHIEF COMPLAINT:  Shock, Hypotension   History of Present Illness:  65 year old woman who presented to [facility] ED 8/15 with back pain, abdominal distention/pain, nausea/vomiting and fatigue. PMHx significant for HTN, T2DM, cirrhosis (new diagnosis this admission), peritoneal carcinomatosis and psoriasis.  Patient notes progressive worsening of symptoms and deterioration for the last 6 months prior to admission.  She was diagnosed with COVID 8/15, found to have new cirrhosis with ascites and was ultimately diagnosed with primary peritoneal carcinomatosis.  She has undergone 5 paracenteses this admission. She received one dose of carboplatin last week, but additional treatment has been limited due to hypotension/inability to tolerate chemotherapy. She has back pain of undetermined duration. Per RN, she had large volume emesis on admission, which has mostly subsided. She was started on midodrine 9/1 but ultimately developed acutely worsening hypotension requiring transfer to the ICU for pressor support.  PCCM consulted for further evaluation and management of hypotension/pressor initiation.   Pertinent Medical History:  HTN, T2DM, cirrhosis (new diagosis), peritoneal carcinomatosis, psoriasis  Significant Hospital Events: Including procedures, antibiotic start and stop dates in addition to other pertinent events   8/15 Admitted to [facility]. COVID + 9/01 PCCM consulted for hypotension requiring pressor initiation, max requirement NE 91mg; weaned down to 313m 9/1AM. Palliative Care contacted for GOWestportiscussion in the setting of carcinomatosis. Patient made decision for DNR with palliation.  Interim History / Subjective:  Feeling a little better this AM Ongoing nausea and generalized abdominal/back pain Nausea seems to be associated with pain Waiting for  sister to arrive [place]iscussion with PMT Ideally would stabilize enough [facility]  Objective:  Blood pressure (!) 87/51, pulse 80, temperature 97.6 F (36.4 C), temperature source Oral, resp. rate 13, height '5\' 7"'$  (1.702 m), weight 87.7 kg, SpO2 100 %.        Intake/Output Summary (Last 24 hours) at 10/22/2020 1308 Last data filed at 10/22/2020 1200 Gross per 24 hour  Intake 4914.78 ml  Output 900 ml  Net 4014.78 ml   Filed Weights   10/06/20 0000 10/08/20 1351  Weight: 87.7 kg 87.7 kg   Physical Examination: General: Ill-appearing middle-aged woman in NAD. Appears uncomfortable. HEENT: [place]/AT, anicteric sclera, PERRL, dry mucous membranes. Neuro: Awake, oriented x 4. Responds to verbal stimuli. Following commands consistently. Moves all 4 extremities spontaneously. CV: RRR, no m/g/r. PULM: Breathing even and unlabored on 4LNC. Lung fields CTAB anteriorly, diminished at bilateral bases. GI: Soft, mildly distended, diffuse/generalized mild TTP. Hypoactive bowel sounds. Extremities: Trace LE edema noted. Skin: Warm/dry, no rashes.  Resolved Hospital Problem List:    Assessment & Plan:  Shock, presumed septic (likely source LLL PNA) with component of chronic hypotension Admitted 8/15, COVID+ with likely LLL PNA. Chronically hypotensive with significant worsening noted 8/31PM prompting PCCM consult for ICU transfer and pressor initiation. - Continue cefepime, vancomycin - F/u finalized Cx data - Volume resuscitation as clinically appropriate - Goal MAP > 65 - Levophed titrated to goal MAP - Midodrine and Florinef discontinued 9/1AM in the context of slow transition to comfort measures; however, if patient's ultimate goal is to leave the hospital and enter hosp[facility] care [facility] would continue these adjunct medications as a means to get patient off of IV vasopressors and out of the hospital/ICU - Could consider resumption of  steroids for added stress-dose effect, if needed  Intractable nausea/vomiting, improving - Continue compazine, Zofran - Phenergan, Reglan readdition as needed - Patient has consistently refused NGT, though this would be an option if needed for relief  AKI NAGMA in the setting of AKI - Trend BMP, gas - Replete electrolytes as indicated - Monitor I&Os - Avoid nephrotoxic agents as able - Ensure adequate renal perfusion  Peritoneal carcinomatosis with malignant ascites Fluid exudative with lymphocyte predominance. - Appreciate Oncology and Pallative Care assistance with management - Unfortunately, based on Onc/PMT notes, appears patient is no longer a candidate for additional chemotherapy, as she has had poor tolerance of medication and little clinical improvement. Prognosis remains very poor. PMT spoke with patient today (9/1) and confirmed wishes for DNR with eventual transition to comfort care. At this time, if the patient's goal is to get out of the hospital and enter hospice care, would continue adjunct medications as above to allow transition out of the hospital. - Ongoing consideration for PleurX-type peritoneal catheter for palliative drainage of fluid; depends on patient's willingness to undergo procedure and ultimate goals with above prognosis  T2DM, non-insulin dependent - SSI PRN - Holding home Jardiance, metformin given poor PO intake/vomiting - Goal CBG 140-180 while in ICU - Continue dextrose-containing fluids, MV  Anemia, likely due to chronic illness - present on admission - Monitor for signs/symptoms of active bleeding - Trend H&H - Transfuse for Hgb < 7.0  Generalized back and abdominal pain - Continue Dilaudid, APAP for pain control  Best Practice: (right click and "Reselect all SmartList Selections" daily)   Diet/type: full liquids , ADAT DVT prophylaxis: prophylactic heparin  GI prophylaxis: PPI Lines: Central line - indwelling port Foley:  Yes, and it is  still needed Code Status:  DNR Last date of multidisciplinary goals of care discussion [9/1 - Palliative Care GOC meeting]  Critical care time: N/A   Rhae Lerner [facility] 10/22/20 1:08 PM  Please see [email or web address] for pager details.  From 7A-7P if no response, please call [phone] After hours, please call ELink [phone]

## 2020-10-22 NOTE — Consult Note (Signed)
NAME:  Beth Sanchez, MRN:  [id number], DOB:  January 19, 1956, LOS: 44 ADMISSION DATE:  10/05/2020, CONSULTATION DATE:  10/22/20 REFERRING MD:  Alyson Reedy, CHIEF COMPLAINT:  shock   History of Present Illness:  Beth Sanchez is a 65 y/o woman with a history of HTN and DM who rpesented to the hospital on 10/05/20 with back pain, abdominal distention, abdominal pain, nausea, vomiting, and fatigue.  She has been having progressive symptoms and deterioration for the last 6 months prior to admission.  She was diagnosed with covid, found to have new cirrhosis with ascites, and primary peritoneal carcinomatosis.  She has undergone 5 paracenteses this admission.  She received 1 dose of carboplatin last week, but additional treatment has been limited due to hypotension.  Started on midodrine today without significant improvement.  She has developed acutely worsening hypotension overnight requiring transfer to the ICU, She has back pain of undetermined duration. Per the RN, she had large volume emesis at the beginning of the shift, but has not been vomiting as much later in the shift.  Pertinent  Medical History  DM2 HTN Newly diagnosed cirrhosis this admission  Significant Hospital Events: Including procedures, antibiotic start and stop dates in addition to other pertinent events   8/15 admitted, paracentesis 3.5L 8/17 paracentesis- 3.3L 8/18 EGD, c-scope cancelled 8/22 paracentesis-5.3L 8/25 paracentesis- minimal fluid 8/29 paracentesis-4L 9/1 moved to ICU for shock  Interim History / Subjective:    Objective   Blood pressure (!) 69/34, pulse (!) 102, temperature 98.8 F (37.1 C), temperature source Axillary, resp. rate 19, height '5\' 7"'$  (1.702 m), weight 87.7 kg, SpO2 98 %.        Intake/Output Summary (Last 24 hours) at 10/22/2020 0350 Last data filed at 10/22/2020 0301 Gross per 24 hour  Intake 742.58 ml  Output 900 ml  Net -157.42 ml   Filed Weights   10/06/20 0000 10/08/20 1351  Weight: 87.7 kg  87.7 kg    Examination: General: ill appearing woman lying in bed in no acute distress.  She was examined laying on her right side due to back pain that precludes her lying flat. HENT: [place]/AT, anicteric sclera Lungs: Reduced right-sided breath sounds, better aeration on the left, CTAB.  No increased work of breathing or conversational dyspnea. Cardiovascular: S1-S2, regular rate and rhythm Abdomen: Mildly tender to palpation diffusely, distended, soft Extremities: No peripheral edema, no cyanosis Neuro: Sleepy but arousable to stimulation, normal speech, moving all extremities Derm: Warm, dry, no rashes  CXR & KUB personally reviewed> new large LLL infiltrate with silhouetting of hemidiaphragm. No free air.  Bicarb 19 BUN 48 Creatinine 2.22 WBC 4.2 H/H 11.3/34.9  Bedside US> no obvious fluid collection on left.   Resolved Hospital Problem list     Assessment & Plan:  Shock-presumed septic with chronic hypotension.  Source is left lower lobe pneumonia. - Blood cultures ordered - Start cefepime and vancomycin -Norepinephrine as required to maintain MAP greater than 65.  Can use port. -Adding albumin given history of ascites and volume loss -Will bedside ultrasound to assess for pleural effusion -Okay to continue fludrocortisone.  Solu-Medrol added for nausea and vomiting, but also cover stress dose steroids.  Intractable nausea and vomiting -Add steroids and Phenergan.  Continue Compazine, Zofran, Reglan. -May need NG tube if persistent; per primary team's notes, she has been refusing this throughout the entire admission -Match I/Os  NAGMA due to AKI; unusual to not have alkalosis due to vomiting -Monitor  AKI, concern for prolonged prerenal with  ongoing vomiting - Renally dose meds, avoid nephrotoxic meds, - Strict I's/O - Continue to monitor  Peritoneal carcinomatosis with malignant ascites. Fluid exudative with lymphocyte predominance -appreciate Oncology's  management -agree with palliative care consult to clarify GoC given her lack of improvement since admission and inability to undergo chemo  Type 2 diabetes, has not been requiring insulin.   - Holding PTA Jardiance and metformin - SSI as needed - Goal BG 100 4280 while admitted to the ICU if requiring insulin -Continue dextrose containing fluids.  Adding daily thiamine given poor p.o. intake over a prolonged period.  Anemia, present on admission.  Suspect this is more chronic due to chronic illness and duration of her symptoms - Transfuse hemoglobin less than 7 or hemodynamically significant bleeding - Continue to monitor  Sister [name] updated via phone.  I encouraged her to still participate in the goals of care meeting previously scheduled for this morning.  Best Practice (right click and "Reselect all SmartList Selections" daily)   Diet/type: NPO DVT prophylaxis: prophylactic heparin  GI prophylaxis: PPI Lines: yes and it is still needed-- port Foley:  N/A Code Status:  full code Last date of multidisciplinary goals of care discussion [meeting planned today]  Labs   CBC: Recent Labs  Lab 10/18/20 0414 10/19/20 0406 10/20/20 0659 10/21/20 0426 10/22/20 0315  WBC 8.1 6.7 4.6 4.7 4.2  NEUTROABS 7.4 6.1 4.2 4.2 4.0  HGB 11.3* 11.4* 10.4* 10.9* 11.3*  HCT 37.2 37.4 33.0* 34.5* 34.9*  MCV 87.1 87.0 85.3 85.2 83.3  PLT 249 246 183 208 [id number]    Basic Metabolic Panel: Recent Labs  Lab 10/16/20 0324 10/17/20 0400 10/18/20 0414 10/19/20 0406 10/20/20 0659 10/21/20 0426 10/22/20 0315  NA 143   < > 138 136 135 137 137  K 4.5   < > 5.2* 5.5* 4.4 4.2 4.3  CL 112*   < > 107 103 105 108 108  CO2 22   < > 18* 20* 19* 21* 19*  GLUCOSE 92   < > 98 99 79 120* 123*  BUN 38*   < > 52* 62* 56* 54* 48*  CREATININE 1.68*   < > 2.13* 3.05* 2.53* 2.35* 2.22*  CALCIUM 9.8   < > 9.4 9.4 9.0 8.9 8.8*  MG 2.3  --   --   --   --   --   --    < > = values in this interval not displayed.    GFR: Estimated Creatinine Clearance: 28.7 mL/min (A) (by C-G formula based on SCr of 2.22 mg/dL (H)). Recent Labs  Lab 10/19/20 0406 10/20/20 0659 10/21/20 0426 10/22/20 0315  WBC 6.7 4.6 4.7 4.2    Liver Function Tests: Recent Labs  Lab 10/18/20 0414 10/19/20 0406 10/20/20 0659 10/21/20 0426 10/22/20 0315  AST 34 36 '30 30 28  '$ ALT '19 22 19 17 19  '$ ALKPHOS 88 99 86 97 105  BILITOT 1.0 0.8 1.0 0.8 1.0  PROT 6.1* 6.3* 5.7* 5.9* 5.5*  ALBUMIN 3.1* 3.1* 3.2* 3.1* 2.8*   No results for input(s): LIPASE, AMYLASE in the last 168 hours. No results for input(s): AMMONIA in the last 168 hours.  ABG No results found for: PHART, PCO2ART, PO2ART, HCO3, TCO2, ACIDBASEDEF, O2SAT   Coagulation Profile: No results for input(s): INR, PROTIME in the last 168 hours.  Cardiac Enzymes: No results for input(s): CKTOTAL, CKMB, CKMBINDEX, TROPONINI in the last 168 hours.  HbA1C: Hgb A1c MFr Bld  Date/Time Value Ref Range  Status  10/07/2020 04:07 AM 5.9 (H) 4.8 - 5.6 % Final    Comment:    (NOTE) Pre diabetes:          5.7%-6.4%  Diabetes:              >6.4%  Glycemic control for   <7.0% adults with diabetes   09/10/2020 09:41 AM 6.3 4.6 - 6.5 % Final    Comment:    Glycemic Control Guidelines for People with Diabetes:Non Diabetic:  <6%Goal of Therapy: <7%Additional Action Suggested:  >8%     CBG: Recent Labs  Lab 10/20/20 0819 10/20/20 1134 10/20/20 1647 10/20/20 2110 10/21/20 0712  GLUCAP 68* 69* 79 93 122*    Review of Systems:   Review of Systems  Constitutional:  Positive for chills. Negative for fever.  HENT: Negative.    Respiratory:  Negative for cough and shortness of breath.   Cardiovascular:  Negative for chest pain and leg swelling.  Gastrointestinal:  Positive for abdominal pain, constipation, nausea and vomiting.  Musculoskeletal:  Positive for back pain.  Skin:  Negative for rash.  Neurological:  Positive for weakness. Negative for dizziness.     Past Medical History:  She,  has a past medical history of Goals of care, counseling/discussion (10/12/2020), Hypertension, Primary peritoneal carcinomatosis ([place]) (10/12/2020), and Psoriasis.   Surgical History:   Past Surgical History:  Procedure Laterality Date   BIOPSY  10/08/2020   Procedure: BIOPSY;  Surgeon: Jackquline Denmark, MD;  Location: [facility] ENDOSCOPY;  Service: Endoscopy;;   ESOPHAGOGASTRODUODENOSCOPY (EGD) WITH PROPOFOL N/A 10/08/2020   Procedure: ESOPHAGOGASTRODUODENOSCOPY (EGD) WITH PROPOFOL;  Surgeon: Jackquline Denmark, MD;  Location: [facility] ENDOSCOPY;  Service: Endoscopy;  Laterality: N/A;   IR IMAGING GUIDED PORT INSERTION  10/13/2020     Social History:   reports that she has never smoked. She has never used smokeless tobacco. She reports that she does not drink alcohol and does not use drugs.   Family History:  Her family history includes Cancer in an other family member; Colon cancer in an other family member; Diabetes in an other family member; Heart attack (age of onset: 64) in her maternal grandfather; Heart disease in her sister; Hypertension in an other family member; Lung cancer in an other family member; Stroke in her mother; Thyroid disease in an other family member.   Allergies No Known Allergies   Home Medications  Prior to Admission medications   Medication Sig Start Date End Date Taking? Authorizing Provider  acetaminophen (TYLENOL) 500 MG tablet Take 500 mg by mouth every 6 (six) hours as needed for moderate pain.   Yes [provider]  empagliflozin (JARDIANCE) 10 MG TABS tablet Take 1 tablet (10 mg total) by mouth daily. 09/15/20  Yes Roma Schanz R, DO  lisinopril-hydrochlorothiazide (ZESTORETIC) 10-12.5 MG tablet Take 1 tablet by mouth daily. 09/10/20  Yes Roma Schanz R, DO  metFORMIN (GLUCOPHAGE) 500 MG tablet Take 1 tablet (500 mg total) by mouth daily with breakfast. 09/10/20  Yes Carollee Herter, Alferd Apa, DO  omeprazole (PRILOSEC) 20 MG  capsule Take 1 capsule (20 mg total) by mouth daily. 09/10/20  Yes Roma Schanz R, DO  simvastatin (ZOCOR) 20 MG tablet Take 20 mg by mouth daily.   Yes [provider]     Critical care time: 60 min.       Julian Hy, DO 10/22/20 5:01 AM [facility]

## 2020-10-22 NOTE — Significant Event (Signed)
Rapid Response Event Note   Reason for Call :  Hypotension after severe and frequent vomiting.  Initial Focused Assessment:  Patient responsive, oriented, following commands, BP 70/40's, Tachycardic, low 90's saturations on 4 L/[place].   Interventions:  Assessment done, consulted with on call hospitalist provider, Gershon Cull, NP and obtained orders to transfer to SD urgently for severe hypotension.  Plan of Care:  Moved patient to 1239, nurse Kiristen gave bedside report to ICU/SD nurse, provider put in orders for bolus, labs, and consulting PCCM. PCCM provider en route to further assess patient.   Event Summary:   MD Notified: [place] Call Time: [place] Time: [place] End Time: [place], RN

## 2020-10-22 NOTE — TOC Progression Note (Signed)
Transition of Care [facility]) - Progression Note    Patient Details  Name: Beth Sanchez MRN: [id number] Date of Birth: 1955/12/30  Transition of [facility]) CM/SW Contact  Leeroy Cha, RN Phone Number: 10/22/2020, 8:04 AM  Clinical Narrative:    Transferred to icu [id number] am of [phone] due to worsening condition.  O2 at 4l/min, iv maxipime, iv levophed started. TOC PLAN OF CARE:  to return to home with self care, oncology consult done, pt is a DNR.    Expected Discharge Plan: Home/Self Care Barriers to Discharge: No Barriers Identified  Expected Discharge Plan and Services Expected Discharge Plan: Home/Self Care       Living arrangements for the past 2 months: Single Family Home                                       Social Determinants of Health (SDOH) Interventions    Readmission Risk Interventions No flowsheet data found.

## 2020-10-22 NOTE — Progress Notes (Signed)
Unfortunately, Beth Sanchez has declared herself.  I told she and her sister on Monday that this would be a "make or break week."  Either she was started going to improve or she would not.  She just has not gotten better.  Her intestines are still silent.  She had to go down to the ICU last night because of hypotension.  I just do not think that we are going to really make much headway with her blood pressure.  At this point, our goal clearly is comfort care.  She is not going to receive any further chemotherapy.  I doubt that she can be able to really eat much.  I did talk to her this morning at length.  She was alert.  She was coherent.  I talked to her about her wishes to be kept alive on machine.  I explained what that meant.  I told her that if she were to go on a breathing machine, she would never come off it just because she is so weak.  She DOES NOT want to be kept alive artificially.  As such, she is a DO NOT RESUSCITATE.  I did talk to her sister at length.  Her sister is going to come in and talk with palliative care this morning.  I really think that her prognosis here is can be less than 2 weeks now.  She just is not able to eat.  The pressors are sort of keeping her blood pressure up.  I am sure that they were turned off, her prognosis would clearly be incredibly limited.  I think that if she were to stabilize a little bit, that she would be a wonderful candidate for [employer].  I think that this is close to where they actually live.  It would be wonderful to try to get her there.  It would make life very easy for her sister.  I think she will need to have a peritoneal catheter put in because of the ascites.  I just hate that we have to keep doing a paracentesis on her.  I talked to her about this.  She would be in agreement to have the peritoneal catheter placed.  I think we can stop him back on a lot of her medications.  She currently is on antibiotics.  I am not sure she is going to  need antibiotics is at all that she has an infection.  I just really thought that we could help her out when I first saw her.  Typically, ovarian/peritoneal carcinoma are pretty responsive to chemotherapy.  We just were not able to give her chemotherapy because of her hypotension.  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  Her bowels are still silent.  This is always been a huge stumbling block for [place] that we have not been able to get around.  It is hard to say how long she will be in the ICU.  Again, I think that her medications can be pulled back a little bit now.  Hopefully, she can be taken off pressors.  Her sister who has a science background, has a good understanding of the problem.  She understands that Ms. Lochner's prognosis is incredibly limited.  She is not sure that Ms. Leppanen will even make it out of the hospital.  I know that Ms. Retzer has a strong faith.  She is not afraid to pass on.  She knows where she is going.  She has always had her eyes  wide open and understood what was going on and why we have not been able to improve her status.     Lattie Haw, MD  2 Timothy 4:6-8

## 2020-10-22 NOTE — Progress Notes (Signed)
Patient was unable to take any PO medications this evening d/t nausea and vomiting and shortness of breath. Will continue to monitor.

## 2020-10-22 NOTE — Progress Notes (Addendum)
   10/22/20 0240  Assess: MEWS Score  BP (!) 56/34  Pulse Rate (!) 105  Level of Consciousness Alert  SpO2 91 %  O2 Device Nasal Cannula  O2 Flow Rate (L/min) 4 L/min  Assess: MEWS Score  MEWS Temp 0  MEWS Systolic 3  MEWS Pulse 1  MEWS RR 1  MEWS LOC 0  MEWS Score 5  MEWS Score Color Red  Assess: if the MEWS score is Yellow or Red  Were vital signs taken at a resting state? Yes  Focused Assessment No change from prior assessment  Does the patient meet 2 or more of the SIRS criteria? No  Does the patient have a confirmed or suspected source of infection? No  Provider and Rapid Response Notified? Yes  MEWS guidelines implemented *See Row Information* Yes  Treat  Pain Scale 0-10  Pain Score 9  Pain Type Acute pain  Pain Location Abdomen  Pain Orientation Mid  Pain Radiating Towards back  Pain Descriptors / Indicators Aching;Sharp  Pain Frequency Constant  Pain Onset On-going  Patients Stated Pain Goal 3  Pain Intervention(s) MD notified (Comment)  Take Vital Signs  Increase Vital Sign Frequency  Red: Q 1hr X 4 then Q 4hr X 4, if remains red, continue Q 4hrs  Escalate  MEWS: Escalate Red: discuss with charge nurse/RN and provider, consider discussing with RRT  Notify: Charge Nurse/RN  Name of Charge Nurse/RN Notified [place], RN  Date Charge Nurse/RN Notified 10/22/20  Time Charge Nurse/RN Notified 0220  Notify: Provider  Provider Name/Title Olena Heckle, RN  Date Provider Notified 10/22/20  Time Provider Notified 0220  Notification Type Page  Notification Reason Change in status  Provider response At bedside  Date of Provider Response 10/22/20  Time of Provider Response 0225  Notify: Rapid Response  Name of Rapid Response RN Notified Mandy, RN  Date Rapid Response Notified 10/22/20  Time Rapid Response Notified 0220  Document  Patient Outcome Transferred/level of care increased  Assess: SIRS CRITERIA  SIRS Temperature  0  SIRS Pulse 1  SIRS Respirations  1  SIRS  WBC 0  SIRS Score Sum  2  Went to room and patient stated she needed to go to bathroom and she had a foley. Proceeded to take VSs and O2 had dropped to 84% on RA and BP had dropped to 56/34 and HR was elevated to the 100s. Notified RR RN and Provider on call. Patient transferred to Step-down ICU room 1239.

## 2020-10-22 NOTE — Progress Notes (Signed)
eLink Physician-Brief Progress Note Patient Name: Beth Sanchez DOB: 1955-06-09 MRN: [id number]   Date of Service  10/22/2020  HPI/Events of Note  Patient with nausea and vomiting in the context of Cirrhosis, marked ascites and metastatic ovarian cancer transferred to the ICU secondary to hypotension, r/o sepsis, and AKI.  eICU Interventions  New Patient Evaluation,         Frederik Pear 10/22/2020, 4:21 AM

## 2020-10-23 ENCOUNTER — Inpatient Hospital Stay (HOSPITAL_COMMUNITY): Payer: Medicare Other

## 2020-10-23 DIAGNOSIS — R109 Unspecified abdominal pain: Secondary | ICD-10-CM

## 2020-10-23 DIAGNOSIS — D649 Anemia, unspecified: Secondary | ICD-10-CM

## 2020-10-23 DIAGNOSIS — C482 Malignant neoplasm of peritoneum, unspecified: Secondary | ICD-10-CM | POA: Diagnosis not present

## 2020-10-23 DIAGNOSIS — M549 Dorsalgia, unspecified: Secondary | ICD-10-CM

## 2020-10-23 DIAGNOSIS — Z7189 Other specified counseling: Secondary | ICD-10-CM

## 2020-10-23 DIAGNOSIS — I9589 Other hypotension: Secondary | ICD-10-CM

## 2020-10-23 DIAGNOSIS — Z515 Encounter for palliative care: Secondary | ICD-10-CM

## 2020-10-23 DIAGNOSIS — R180 Malignant ascites: Secondary | ICD-10-CM | POA: Diagnosis not present

## 2020-10-23 HISTORY — PX: IR IMAGE GUIDED DRAINAGE PERCUT CATH  PERITONEAL RETROPERIT: IMG5467

## 2020-10-23 MED ORDER — HYDROMORPHONE 1 MG/ML IV SOLN
INTRAVENOUS | Status: DC
  Administered 2020-10-23: 30 mg via INTRAVENOUS
  Administered 2020-10-29: 0.935 mg via INTRAVENOUS
  Administered 2020-10-29: 1.2 mg via INTRAVENOUS
  Administered 2020-10-29: 1.23 mg via INTRAVENOUS
  Administered 2020-10-29: 2.24 mg via INTRAVENOUS
  Administered 2020-10-30: 1.16 mg via INTRAVENOUS
  Administered 2020-10-30: 1.45 mg via INTRAVENOUS
  Administered 2020-10-30: 1.35 mg via INTRAVENOUS
  Filled 2020-10-23 (×2): qty 30

## 2020-10-23 MED ORDER — LIDOCAINE HCL (PF) 1 % IJ SOLN
INTRAMUSCULAR | Status: AC | PRN
  Administered 2020-10-23: 10 mL via INTRADERMAL

## 2020-10-23 MED ORDER — SODIUM CHLORIDE 0.9 % IV SOLN: 12.5000 mg | Freq: Four times a day (QID) | INTRAVENOUS | Status: DC | PRN

## 2020-10-23 MED ORDER — FENTANYL CITRATE (PF) 100 MCG/2ML IJ SOLN
INTRAMUSCULAR | Status: AC | PRN
  Administered 2020-10-23 (×2): 25 ug via INTRAVENOUS

## 2020-10-23 MED ORDER — LIDOCAINE HCL (PF) 1 % IJ SOLN
INTRAMUSCULAR | Status: AC
  Filled 2020-10-23: qty 30

## 2020-10-23 MED ORDER — MIDAZOLAM HCL 2 MG/2ML IJ SOLN
INTRAMUSCULAR | Status: AC
  Filled 2020-10-23: qty 2

## 2020-10-23 MED ORDER — MIDAZOLAM HCL 2 MG/2ML IJ SOLN
INTRAMUSCULAR | Status: AC | PRN
  Administered 2020-10-23: 0.5 mg via INTRAVENOUS

## 2020-10-23 MED ORDER — MIDODRINE HCL 5 MG PO TABS
20.0000 mg | ORAL_TABLET | Freq: Three times a day (TID) | ORAL | Status: DC
  Administered 2020-10-24 – 2020-11-07 (×39): 20 mg via ORAL
  Filled 2020-10-23 (×50): qty 4

## 2020-10-23 MED ORDER — SODIUM CHLORIDE 0.9 % IV SOLN
INTRAVENOUS | Status: DC | PRN
  Administered 2020-10-23 – 2020-10-26 (×3): 500 mL via INTRAVENOUS

## 2020-10-23 MED ORDER — SODIUM CHLORIDE 0.9 % IV SOLN
12.5000 mg | Freq: Four times a day (QID) | INTRAVENOUS | Status: DC
  Administered 2020-10-23 – 2020-11-09 (×68): 12.5 mg via INTRAVENOUS
  Filled 2020-10-23: qty 0.5
  Filled 2020-10-23 (×7): qty 12.5
  Filled 2020-10-23: qty 0.5
  Filled 2020-10-23: qty 12.5
  Filled 2020-10-23: qty 0.5
  Filled 2020-10-23 (×2): qty 12.5
  Filled 2020-10-23: qty 0.5
  Filled 2020-10-23 (×15): qty 12.5
  Filled 2020-10-23 (×2): qty 0.5
  Filled 2020-10-23 (×3): qty 12.5
  Filled 2020-10-23: qty 0.5
  Filled 2020-10-23 (×6): qty 12.5
  Filled 2020-10-23 (×3): qty 0.5
  Filled 2020-10-23 (×29): qty 12.5

## 2020-10-23 MED ORDER — FENTANYL CITRATE (PF) 100 MCG/2ML IJ SOLN
INTRAMUSCULAR | Status: AC
  Filled 2020-10-23: qty 2

## 2020-10-23 MED ORDER — DIPHENHYDRAMINE HCL 12.5 MG/5ML PO ELIX: 12.5000 mg | ORAL_SOLUTION | Freq: Four times a day (QID) | ORAL | Status: DC | PRN

## 2020-10-23 MED ORDER — DIPHENHYDRAMINE HCL 50 MG/ML IJ SOLN
12.5000 mg | Freq: Four times a day (QID) | INTRAMUSCULAR | Status: DC | PRN
  Administered 2020-10-25: 12.5 mg via INTRAVENOUS
  Filled 2020-10-23: qty 1

## 2020-10-23 MED ORDER — CEFAZOLIN SODIUM-DEXTROSE 2-4 GM/100ML-% IV SOLN
INTRAVENOUS | Status: AC
  Administered 2020-10-23: 2000 mg via INTRAVENOUS
  Filled 2020-10-23: qty 100

## 2020-10-23 NOTE — Progress Notes (Deleted)
NAME:  Beth Sanchez, MRN:  [id number], DOB:  1955/12/25, LOS: 58 ADMISSION DATE:  10/05/2020  CONSULTATION DATE: 10/22/2020 REFERRING MD:  Eliseo Squires - [facility] CHIEF COMPLAINT:  Shock, Hypotension   History of Present Illness:  65 year old woman who presented to [facility] ED 8/15 with back pain, abdominal distention/pain, nausea/vomiting and fatigue. PMHx significant for HTN, T2DM, cirrhosis (new diagnosis this admission), peritoneal carcinomatosis and psoriasis.  Patient notes progressive worsening of symptoms and deterioration for the last 6 months prior to admission.  She was diagnosed with COVID 8/15, found to have new cirrhosis with ascites and was ultimately diagnosed with primary peritoneal carcinomatosis.  She has undergone 5 paracenteses this admission. She received one dose of carboplatin last week, but additional treatment has been limited due to hypotension/inability to tolerate chemotherapy. She has back pain of undetermined duration. Per RN, she had large volume emesis on admission, which has mostly subsided. She was started on midodrine 9/1 but ultimately developed acutely worsening hypotension requiring transfer to the ICU for pressor support.  PCCM consulted for further evaluation and management of hypotension/pressor initiation.   Pertinent Medical History:  HTN, T2DM, cirrhosis (new diagosis), peritoneal carcinomatosis, psoriasis  Significant Hospital Events: Including procedures, antibiotic start and stop dates in addition to other pertinent events   8/15 Admitted to [facility]. COVID + 9/01 PCCM consulted for hypotension requiring pressor initiation, max requirement NE 6mg; weaned down to 364m 9/1AM. Palliative Care [place] in the setting of carcinomatosis. Patient made decision for DNR with palliation.  Interim History / Subjective:  Feeling a little better this AM Ongoing nausea and generalized abdominal/back pain Nausea seems to be associated with pain Waiting for  sister to arrive for [place] with PMT [facility]  Objective:  Blood pressure (!) 86/54, pulse 76, temperature 97.7 F (36.5 C), temperature source Oral, resp. rate 16, height '5\' 7"'$  (1.702 m), weight 87.7 kg, SpO2 99 %.        Intake/Output Summary (Last 24 hours) at 10/23/2020 0930 Last data filed at 10/23/2020 0856 Gross per 24 hour  Intake 700.02 ml  Output 800 ml  Net -99.98 ml    Filed Weights   10/06/20 0000 10/08/20 1351  Weight: 87.7 kg 87.7 kg   Physical Examination: General: Ill-appearing middle-aged woman in NAD. Appears uncomfortable. HEENT: [place]/AT, anicteric sclera, PERRL, dry mucous membranes. Neuro: Awake, oriented x 4. Responds to verbal stimuli. Following commands consistently. Moves all 4 extremities spontaneously. CV: RRR, no m/g/r. PULM: Breathing even and unlabored on 4LNC. Lung fields CTAB anteriorly, diminished at bilateral bases. GI: Soft, mildly distended, diffuse/generalized mild TTP. Hypoactive bowel sounds. Extremities: Trace LE edema noted. Skin: Warm/dry, no rashes.  Resolved Hospital Problem List:    Assessment & Plan:  Shock, presumed septic (likely source LLL PNA) with component of chronic hypotension Admitted 8/15, COVID+ with likely LLL PNA. Chronically hypotensive with significant worsening noted 8/31PM prompting PCCM consult for ICU transfer and pressor initiation. Finished course abx for Pna. - Goal SBP 80 - Levophed titrated to goal MAP - Resume midodrine and florinef, not receiving due to nausea  Intractable nausea/vomiting: Due to carcinomatosis, severe constipation. - Phenergan scheduled (zofran, compazine ineffective), consider PRN benzo - Patient has consistently refused NGT and bowel regimen, though this would be an option if needed for relief  AKI NAGMA in the setting of AKI - Replete electrolytes as indicated - Monitor I&Os - Avoid nephrotoxic agents as  able -  Ensure adequate renal perfusion  Peritoneal carcinomatosis with malignant ascites Fluid exudative with lymphocyte predominance. - Appreciate Oncology and Pallative Care assistance with management - Unfortunately, based on Onc/PMT notes, appears patient is no longer a candidate for additional chemotherapy, as she has had poor tolerance of medication and little clinical improvement. Prognosis remains very poor. PMT spoke with patient today (9/1) and confirmed wishes for DNR with eventual transition to comfort care. At this time, if the patient's goal is to get out of the hospital and enter hospice care, would continue adjunct medications as above to allow transition out of the hospital. - peritoneal drain placement scheduled 9/2 AM  T2DM, non-insulin dependent - SSI PRN - Holding home Jardiance, metformin given poor PO intake/vomiting - Goal CBG 140-180 while in ICU - Continue dextrose-containing fluids, MV  Anemia, likely due to chronic illness - present on admission - Monitor for signs/symptoms of active bleeding - Trend H&H - Transfuse for Hgb < 7.0  Generalized back and abdominal pain - Continue Dilaudid, APAP for pain control  Best Practice: (right click and "Reselect all SmartList Selections" daily)   Diet/type: full liquids , ADAT DVT prophylaxis: prophylactic heparin  GI prophylaxis: PPI Lines: Central line - indwelling port Foley:  Yes, and it is still needed Code Status:  DNR Last date of multidisciplinary goals of care discussion [9/1 - Palliative Care [place] meeting]  Critical care time: N/A   Lanier Clam, MD [facility] 10/23/20 9:30 AM  Please see [email or web address] for contact details.  From 7A-7P if no response, please call [phone] After hours, please call ELink [phone]

## 2020-10-23 NOTE — Progress Notes (Signed)
Chief Complaint: Patient was seen in consultation today for peritoneal pleurx  Referring Physician(s): Ennever,Peter R  Supervising Physician: Aletta Edouard  Patient Status: [facility] - In-pt  History of Present Illness: Beth Sanchez is a 65 y.o. female with peritoneal carcinomatosis with recurrent malignant ascites. She is currently in ICU on pressors for hypotension but they are trying to wean her off so that she can be discharged to [facility] place with Hospice. Discussed with RN and PCCM MD, who feel pt stable for IR to proceed. Will try to minimize sedation. PMHx, meds, labs, imaging, allergies reviewed. Has been NPO today   Past Medical History:  Diagnosis Date   Goals of care, counseling/discussion 10/12/2020   Hypertension    Primary peritoneal carcinomatosis ([place]) 10/12/2020   Psoriasis     Past Surgical History:  Procedure Laterality Date   BIOPSY  10/08/2020   Procedure: BIOPSY;  Surgeon: Jackquline Denmark, MD;  Location: [facility] ENDOSCOPY;  Service: Endoscopy;;   ESOPHAGOGASTRODUODENOSCOPY (EGD) WITH PROPOFOL N/A 10/08/2020   Procedure: ESOPHAGOGASTRODUODENOSCOPY (EGD) WITH PROPOFOL;  Surgeon: Jackquline Denmark, MD;  Location: [facility] ENDOSCOPY;  Service: Endoscopy;  Laterality: N/A;   IR IMAGING GUIDED PORT INSERTION  10/13/2020    Allergies: Patient has no known allergies.  Medications: Prior to Admission medications   Medication Sig Start Date End Date Taking? Authorizing Provider  acetaminophen (TYLENOL) 500 MG tablet Take 500 mg by mouth every 6 (six) hours as needed for moderate pain.   Yes [provider]  empagliflozin (JARDIANCE) 10 MG TABS tablet Take 1 tablet (10 mg total) by mouth daily. 09/15/20  Yes Roma Schanz R, DO  lisinopril-hydrochlorothiazide (ZESTORETIC) 10-12.5 MG tablet Take 1 tablet by mouth daily. 09/10/20  Yes Roma Schanz R, DO  metFORMIN (GLUCOPHAGE) 500 MG tablet Take 1 tablet (500 mg total) by mouth daily with breakfast. 09/10/20   Yes Carollee Herter, Alferd Apa, DO  omeprazole (PRILOSEC) 20 MG capsule Take 1 capsule (20 mg total) by mouth daily. 09/10/20  Yes Roma Schanz R, DO  simvastatin (ZOCOR) 20 MG tablet Take 20 mg by mouth daily.   Yes [provider]     Family History  Problem Relation Age of Onset   Stroke Mother    Heart disease Sister    Heart attack Maternal Grandfather 71   Diabetes Other    Colon cancer Other    Lung cancer Other    Cancer Other        Bladder cancer   Hypertension Other    Thyroid disease Other        Hypothyroid    Social History   Socioeconomic History   Marital status: Widowed    Spouse name: Not on file   Number of children: Not on file   Years of education: Not on file   Highest education level: Not on file  Occupational History   Not on file  Tobacco Use   Smoking status: Never   Smokeless tobacco: Never  Substance and Sexual Activity   Alcohol use: No   Drug use: No   Sexual activity: Not on file  Other Topics Concern   Not on file  Social History Narrative   Not on file   Social Determinants of Health   Financial Resource Strain: Not on file  Food Insecurity: Not on file  Transportation Needs: Not on file  Physical Activity: Not on file  Stress: Not on file  Social Connections: Not on file    Review of  Systems: A 12 point ROS discussed and pertinent positives are indicated in the HPI above.  All other systems are negative.  Review of Systems  Vital Signs: BP (!) 86/54   Pulse 76   Temp 97.7 F (36.5 C) (Oral)   Resp 16   Ht '5\' 7"'$  (1.702 m)   Wt 87.7 kg   SpO2 99%   BMI 30.28 kg/m   Physical Exam Constitutional:      Appearance: She is well-developed. She is ill-appearing.  Cardiovascular:     Rate and Rhythm: Normal rate and regular rhythm.     Heart sounds: Normal heart sounds.  Pulmonary:     Effort: Pulmonary effort is normal. No respiratory distress.     Breath sounds: Normal breath sounds.  Abdominal:      General: There is distension.     Palpations: Abdomen is soft.     Tenderness: There is no abdominal tenderness.  Skin:    General: Skin is warm and dry.  Neurological:     General: No focal deficit present.     Mental Status: She is alert and oriented to person, place, and time.  Psychiatric:        Mood and Affect: Mood normal.        Thought Content: Thought content normal.        Judgment: Judgment normal.    Imaging: CT ABDOMEN PELVIS WO CONTRAST  Result Date: 10/05/2020 CLINICAL DATA:  Abdominal distension and pain. Vomiting and diarrhea. Sepsis. EXAM: CT ABDOMEN AND PELVIS WITHOUT CONTRAST TECHNIQUE: Multidetector CT imaging of the abdomen and pelvis was performed following the standard protocol without IV contrast. COMPARISON:  None. FINDINGS: Lower chest: No acute findings. Hepatobiliary: No mass visualized on this unenhanced exam. 2 cm gallstone is noted, however there are no signs of acute cholecystitis or biliary ductal dilatation. Pancreas: No mass or inflammatory process visualized on this unenhanced exam. Spleen:  Within normal limits in size. Adrenals/Urinary tract: No evidence of urolithiasis or hydronephrosis. Unremarkable unopacified urinary bladder. Stomach/Bowel: No evidence of obstruction, inflammatory process, or abnormal fluid collections. Diverticulosis is seen mainly involving the sigmoid colon, however there is no evidence of diverticulitis. Vascular/Lymphatic: No pathologically enlarged lymph nodes identified. No evidence of abdominal aortic aneurysm. Aortic atherosclerotic calcification noted. Reproductive:  No mass or other significant abnormality. Other: Large amount of ascites is seen throughout the abdomen and pelvis. Musculoskeletal:  No suspicious bone lesions identified. IMPRESSION: Large amount of ascites. Cholelithiasis. No radiographic evidence of acute cholecystitis. Mild sigmoid diverticulosis, without radiographic evidence of diverticulitis. Aortic  Atherosclerosis (ICD10-I70.0). Electronically Signed   By: Marlaine Hind M.D.   On: 10/05/2020 13:29   DG Abd 1 View  Result Date: 10/22/2020 CLINICAL DATA:  [place].  Abdominal pain EXAM: ABDOMEN - 1 VIEW COMPARISON:  Two days ago FINDINGS: Unchanged enteric contrast in the ascending more than descending colon. Mild gaseous distention of the stomach. No concerning mass effect or calcification. IMPRESSION: Nonobstructive bowel gas pattern that is unchanged from 2 days ago. No progression of enteric contrast. Electronically Signed   By: Monte Fantasia M.D.   On: 10/22/2020 04:24   CT CHEST WO CONTRAST  Result Date: 10/07/2020 CLINICAL DATA:  Unintentional weight loss. Clinical concern for malignancy. EXAM: CT CHEST WITHOUT CONTRAST TECHNIQUE: Multidetector CT imaging of the chest was performed following the standard protocol without IV contrast. COMPARISON:  Radiograph 10/05/2020 FINDINGS: Cardiovascular: Aortic atherosclerosis. Heart is normal in size. There are coronary artery calcifications. No pericardial effusion. Mediastinum/Nodes: No  enlarged mediastinal lymph nodes. Limited assessment for hilar adenopathy in this unenhanced exam. Slightly patulous esophagus. No esophageal wall thickening. No thyroid nodule. There is no axillary adenopathy. Lungs/Pleura: Chest bilateral pleural effusions. Elevation of left hemidiaphragm with adjacent compressive atelectasis at the left lung base. No confluent airspace disease. There is no dominant pulmonary mass. There are tiny bilateral pulmonary nodules within both lungs, the majority of which are subpleural or perifissural. Most of these are in the right lung, and majority are millimetric, with largest in the right upper lobe measuring 3 mm series 7, image 38, and 3 mm perifissural left upper lobe series 7, image 43. Additional tiny nodules are seen in the right lung on series 7, images 18, 28, 83 and 95. The trachea and central bronchi are patent. Upper Abdomen:  Assessed on recent abdominopelvic CT. Moderate to large volume ascites again seen in the upper abdomen. Musculoskeletal: There are no acute or suspicious osseous abnormalities. No evidence of focal bone lesion or bone destruction. Multilevel degenerative change in the spine. Minimal chest wall soft tissue edema. No dominant breast mass or soft tissue abnormality. IMPRESSION: 1. Tiny bilateral pulmonary nodules, the majority of which are subpleural or perifissural. Largest nodule measures 3 mm. Mild nonspecific, none of these nodules are specifically suspicious for malignancy. No follow-up needed if patient is low-risk (and has no known or suspected primary neoplasm). Non-contrast chest CT can be considered in 12 months if patient is high-risk. This recommendation follows the consensus statement: Guidelines for Management of Incidental Pulmonary Nodules Detected on CT Images: From the Fleischner Society 2017; Radiology 2017; 284:228-243. 2. No explanation for weight loss. No findings suspicious for intrathoracic malignancy. 3. Small bilateral pleural effusions. Elevation of left hemidiaphragm with adjacent compressive atelectasis at the left lung base. 4. Aortic atherosclerosis.  Coronary artery calcifications. Aortic Atherosclerosis (ICD10-I70.0). Electronically Signed   By: Keith Rake M.D.   On: 10/07/2020 19:55   US PELVIS TRANSVAGINAL NON-OB (TV ONLY)  Result Date: 10/10/2020 CLINICAL DATA:  History of primary peritoneal carcinoma. EXAM: ULTRASOUND PELVIS TRANSVAGINAL TECHNIQUE: Transvaginal ultrasound examination of the pelvis was performed including evaluation of the uterus, ovaries, adnexal regions, and pelvic cul-de-sac. COMPARISON:  10/05/2020 abdominopelvic CT. FINDINGS: Uterus Measurements: 8.6 x 4.6 x 5.1 cm = volume: 105 mL. Uterine fundal heterogeneous hypoechoic mass is favored to represent a fibroid. 4.1 x 3.9 x 3.8 cm Endometrium Not confidently visualized secondary to the presumed fibroid.  Right ovary Measurements: 5.0 x 4.5 x 3.1 cm = volume: 36 mL. Minimally septated cystic lesion within measures 4.6 x 2.9 x 2.7 cm. No internal vascularity. Left ovary Not visualized Other findings:  Moderate volume fluid, as on CT. IMPRESSION: 1. Uterine fundal mass, likely a fibroid. 2. Right ovarian minimally complex cystic lesion is indeterminate. Given the appearance of the abdomen and pelvis on CT, consider nonemergent (optimally outpatient) pre and post-contrast pelvic MRI. 3. Lack of visualization of the left ovary. Electronically Signed   By: Abigail Miyamoto M.D.   On: 10/10/2020 07:25   US RENAL  Result Date: 10/19/2020 CLINICAL DATA:  Renal failure EXAM: RENAL / URINARY TRACT ULTRASOUND COMPLETE COMPARISON:  10/05/2020 FINDINGS: Right Kidney: Renal measurements: 9.8 x 5.4 x 5.5 cm = volume: 151 mL. Echogenicity within normal limits. No mass or hydronephrosis visualized. Left Kidney: Renal measurements: 11.0 x 4.9 x 3.3 cm = volume: 150 mL. Echogenicity within normal limits. No mass. Mild left hydronephrosis. Bladder: Appears normal for degree of bladder distention. Other: Ascites in the low  abdomen. IMPRESSION: 1. Mild left hydronephrosis. No calculi or other obstructing etiology identified. 2. Ascites. Electronically Signed   By: Eddie Candle M.D.   On: 10/19/2020 19:05   US Paracentesis  Result Date: 10/19/2020 INDICATION: Patient with history of peritoneal carcinomatosis with recurrent ascites. Request is for therapeutic paracentesis. EXAM: ULTRASOUND GUIDED THERAPEUTIC PARACENTESIS MEDICATIONS: LIDOCAINE 1% 10 ML COMPLICATIONS: None immediate. PROCEDURE: Informed written consent was obtained from the patient after a discussion of the risks, benefits and alternatives to treatment. A timeout was performed prior to the initiation of the procedure. Initial ultrasound scanning demonstrates a large amount of ascites within the right lower abdominal quadrant. The right lower abdomen was prepped and  draped in the usual sterile fashion. 1% lidocaine was used for local anesthesia. Following this, a 19 gauge, 7-cm, Yueh catheter was introduced. An ultrasound image was saved for documentation purposes. The paracentesis was performed. The catheter was removed and a dressing was applied. The patient tolerated the procedure well without immediate post procedural complication. FINDINGS: A total of approximately 4 L of straw-colored fluid was removed. IMPRESSION: Successful ultrasound-guided therapeutic paracentesis yielding 4 liters of peritoneal fluid. Read by: Rushie Nyhan, NP Electronically Signed   By: Michaelle Birks M.D.   On: 10/19/2020 16:52   US Paracentesis  Result Date: 10/15/2020 INDICATION: Peritoneal carcinomatosis EXAM: ULTRASOUND GUIDED  PARACENTESIS MEDICATIONS: Lidocaine 1% subcutaneous COMPLICATIONS: None immediate PROCEDURE: Informed written consent was obtained from the patient after a discussion of the risks, benefits and alternatives to treatment. A timeout was performed prior to the initiation of the procedure. Initial ultrasound scanning demonstrates a moderate amount of ascites within the right lower abdominal quadrant. The right lower abdomen was prepped and draped in the usual sterile fashion. 1% lidocaine was used for local anesthesia. Following this, a 6 Fr Safe-T-Centesis catheter was introduced. An ultrasound image was saved for documentation purposes. The paracentesis was performed. The catheter was removed and a dressing was applied. The patient tolerated the procedure well without immediate post procedural complication. Patient received post-procedure intravenous albumin; see nursing notes for details. FINDINGS: A total of approximately 4.8 L of  fluid was removed. IMPRESSION: Successful ultrasound-guided paracentesis yielding 4.8 liters of peritoneal fluid. Electronically Signed   By: Lucrezia Europe M.D.   On: 10/15/2020 15:19   US Paracentesis  Result Date:  10/12/2020 INDICATION: Abdominal distention secondary to recurrent malignant ascites. Request for diagnostic and therapeutic paracentesis. EXAM: ULTRASOUND GUIDED RIGHT LOWER QUADRANT PARACENTESIS MEDICATIONS: 1% plain lidocaine, 5 mL COMPLICATIONS: None immediate. PROCEDURE: Informed written consent was obtained from the patient after a discussion of the risks, benefits and alternatives to treatment. A timeout was performed prior to the initiation of the procedure. Initial ultrasound scanning demonstrates a large amount of ascites within the right lower abdominal quadrant. The right lower abdomen was prepped and draped in the usual sterile fashion. 1% lidocaine was used for local anesthesia. Following this, a 19 gauge, 7-cm, Yueh catheter was introduced. An ultrasound image was saved for documentation purposes. The paracentesis was performed. The catheter was removed and a dressing was applied. The patient tolerated the procedure well without immediate post procedural complication. FINDINGS: A total of approximately 5.3 L of clear yellow fluid was removed. Samples were sent to the laboratory as requested by the clinical team. IMPRESSION: Successful ultrasound-guided paracentesis yielding 5.3 liters of peritoneal fluid. Read by: Ascencion Dike PA-C Electronically Signed   By: Corrie Mckusick D.O.   On: 10/12/2020 13:42   US Paracentesis  Result Date:  10/07/2020 INDICATION: Patient with history of acute kidney injury, COVID-19, anemia, abdominal distension/recurrent ascites; request received for diagnostic and therapeutic paracentesis up to 6 liters. EXAM: ULTRASOUND GUIDED DIAGNOSTIC AND THERAPEUTIC  PARACENTESIS MEDICATIONS: 1% lidocaine to skin/SQ tissue COMPLICATIONS: None immediate. PROCEDURE: Informed written consent was obtained from the patient after a discussion of the risks, benefits and alternatives to treatment. A timeout was performed prior to the initiation of the procedure. Initial ultrasound scanning  demonstrates a moderate amount of ascites within the right lower abdominal quadrant. The right lower abdomen was prepped and draped in the usual sterile fashion. 1% lidocaine was used for local anesthesia. Following this, a 19 gauge, 10-cm, Yueh catheter was introduced. An ultrasound image was saved for documentation purposes. The paracentesis was performed. The catheter was removed and a dressing was applied. The patient tolerated the procedure well without immediate post procedural complication. Patient currently receiving IV albumin. FINDINGS: A total of approximately 3.3 liters of hazy,dark amber fluid was removed. Samples were sent to the laboratory as requested by the clinical team. IMPRESSION: Successful ultrasound-guided diagnostic and therapeutic paracentesis yielding 3.3 liters of peritoneal fluid. Read by: Rowe Robert, PA-C Electronically Signed   By: Miachel Roux M.D.   On: 10/07/2020 14:18   US Paracentesis  Result Date: 10/06/2020 INDICATION: Patient with history of abdominal distension/pain, hypotension, renal insufficiency, COVID-19, ascites; request received for diagnostic and therapeutic paracentesis EXAM: ULTRASOUND GUIDED DIAGNOSTIC AND THERAPEUTIC PARACENTESIS MEDICATIONS: 1% lidocaine to skin and subcutaneous tissue COMPLICATIONS: None immediate. PROCEDURE: Informed written consent was obtained from the patient after a discussion of the risks, benefits and alternatives to treatment. A timeout was performed prior to the initiation of the procedure. Initial ultrasound scanning demonstrates a large amount of ascites within the right lower abdominal quadrant. The right lower abdomen was prepped and draped in the usual sterile fashion. 1% lidocaine was used for local anesthesia. Following this, a 19 gauge, 10-cm, Yueh catheter was introduced. An ultrasound image was saved for documentation purposes. The paracentesis was performed. The catheter was removed and a dressing was applied. The patient  tolerated the procedure well without immediate post procedural complication. Patient received post-procedure intravenous albumin; see nursing notes for details. FINDINGS: A total of approximately 3.5 liters of hazy, amber/blood-tinged fluid was removed. Samples were sent to the laboratory as requested by the clinical team. IMPRESSION: Successful ultrasound-guided diagnostic and therapeutic paracentesis yielding 3.5 liters of peritoneal fluid. Read by: Rowe Robert, PA-C Electronically Signed   By: Sandi Mariscal M.D.   On: 10/05/2020 17:12   DG CHEST PORT 1 VIEW  Result Date: 10/22/2020 CLINICAL DATA:  [place] EXAM: PORTABLE CHEST 1 VIEW COMPARISON:  10/05/2020 FINDINGS: Extensive airspace disease in the left lower lung with some volume loss. Stable heart size and mediastinal contours. Interval porta catheter with tip at the SVC. IMPRESSION: Pneumonia the left lung base. Electronically Signed   By: Monte Fantasia M.D.   On: 10/22/2020 04:20   DG Chest Port 1 View  Result Date: 10/05/2020 CLINICAL DATA:  Questionable sepsis EXAM: PORTABLE CHEST 1 VIEW COMPARISON:  None. FINDINGS: The patient is rotated to the right. The cardiomediastinal silhouette is within normal limits, allowing for low lung volumes and AP technique. Lung volumes are low. There is no focal consolidation or pulmonary edema. There is no pleural effusion or pneumothorax. There is no acute osseous abnormality. IMPRESSION: Low lung volumes. Otherwise, no radiographic evidence of acute cardiopulmonary process. Electronically Signed   By: Valetta Mole M.D.   On:  10/05/2020 12:26   DG Abd 2 Views  Result Date: 10/12/2020 CLINICAL DATA:  Ileus.  Abdominal pain.  Distension. EXAM: ABDOMEN - 2 VIEW COMPARISON:  Two-view abdomen 10/10/2020 FINDINGS: Slightly dilated small bowel again noted in the left abdomen without significant change. Gas is present in the colon. No definite free air present on supine images. Asymmetric left basilar airspace disease  is present. IMPRESSION: 1. Stable ileus pattern. 2. Asymmetric left basilar airspace disease. Pneumonia is not excluded. Electronically Signed   By: San Morelle M.D.   On: 10/12/2020 11:30   DG Abd 2 Views  Result Date: 10/10/2020 CLINICAL DATA:  Vomiting and generalized abdominal pain. EXAM: ABDOMEN - 2 VIEW COMPARISON:  10/09/2020 FINDINGS: There is a mildly prominent loop of small bowel in the LEFT mid abdomen, similar in appearance to prior study. Findings are consistent with focal ileus or early obstruction. Gas and stool are seen throughout the colon. Degenerative changes are noted in the spine. IMPRESSION: Persistent mild dilatation of small bowel loop in the LEFT mid abdomen. Electronically Signed   By: Nolon Nations M.D.   On: 10/10/2020 12:10   DG Abd Portable 1V  Result Date: 10/20/2020 CLINICAL DATA:  Follow-up ileus. EXAM: PORTABLE ABDOMEN - 1 VIEW COMPARISON:  Abdominal x-ray 10/17/2020. FINDINGS: There are no dilated large or small bowel loops identified. Residual contrast is seen within the right colon. Stomach is moderately distended. There is no free intraperitoneal air on this supine view. No suspicious calcifications are identified. No acute fractures are seen. IMPRESSION: Nonobstructive, nonspecific bowel gas pattern. Electronically Signed   By: Ronney Asters M.D.   On: 10/20/2020 15:18   DG Abd Portable 1V  Result Date: 10/17/2020 CLINICAL DATA:  Abdominal distension, abdominal pain. History of pancreatitis. EXAM: PORTABLE ABDOMEN - 1 VIEW COMPARISON:  Plain films of the abdomen dated 10/15/2020 an 10/09/2020. FINDINGS: No dilated large or small bowel loops are seen. No evidence of free intraperitoneal air is seen. Ill-defined airspace opacity at the LEFT lung base, likely atelectasis. Degenerative spondylosis of the slightly scoliotic thoracolumbar spine, mild to moderate in degree. No acute-appearing osseous abnormality. IMPRESSION: 1. Nonobstructive bowel gas pattern.  2. Probable atelectasis at the LEFT lung base. Electronically Signed   By: Franki Cabot M.D.   On: 10/17/2020 10:58   DG Abd Portable 1V  Result Date: 10/15/2020 CLINICAL DATA:  Abdominal pain EXAM: PORTABLE ABDOMEN - 1 VIEW COMPARISON:  Radiograph 10/12/2020 FINDINGS: Decreased distention of small bowel in the left hemiabdomen. There is no acute osseous abnormality. Mild right hip degenerative changes. IMPRESSION: Decreased distention of small bowel in the left hemiabdomen. Electronically Signed   By: Maurine Simmering M.D.   On: 10/15/2020 12:35   DG Abd Portable 1V  Result Date: 10/09/2020 CLINICAL DATA:  Intermittent abdominal pain with distention. EXAM: PORTABLE ABDOMEN - 1 VIEW COMPARISON:  None. FINDINGS: Single mildly dilated small bowel loop in the left abdomen. Air and stool in the rectum. No radio-opaque calculi or other significant radiographic abnormality are seen. No acute osseous abnormality. IMPRESSION: 1. Single mildly dilated small bowel loop in the left abdomen, favor focal ileus. Electronically Signed   By: Titus Dubin M.D.   On: 10/09/2020 11:30   ECHOCARDIOGRAM COMPLETE  Result Date: 10/06/2020    ECHOCARDIOGRAM REPORT   Patient Name:   GLENDELL GREB Date of Exam: 10/06/2020 Medical Rec #:  [id number]      Height:       67.0 in Accession #:  [id number]     Weight:       193.3 lb Date of Birth:  Jul 03, 1955      BSA:          1.993 m Patient Age:    75 years       BP:           94/71 mmHg Patient Gender: F              HR:           100 bpm. Exam Location:  Inpatient Procedure: 2D Echo, Cardiac Doppler and Color Doppler                       STAT ECHO Reported to: Dr Jenkins Rouge on 10/06/2020 1:26:00 PM. Indications:    Ascites [id number]  History:        Patient has no prior history of Echocardiogram examinations.                 Risk Factors:Hypertension. COVID-19 Positive.  Sonographer:    Tiffany Dance RVT Referring Phys: [id number] [place]  1. Ascites noted  on sub costal images Would not appear to be cardiac related.  2. Left ventricular ejection fraction, by estimation, is 60 to 65%. The left ventricle has normal function. The left ventricle has no regional wall motion abnormalities. Left ventricular diastolic parameters are consistent with Grade I diastolic dysfunction (impaired relaxation).  3. Right ventricular systolic function is normal. The right ventricular size is normal. There is normal pulmonary artery systolic pressure.  4. Left atrial size was mildly dilated.  5. The mitral valve is abnormal. No evidence of mitral valve regurgitation. No evidence of mitral stenosis.  6. The aortic valve is tricuspid. There is mild calcification of the aortic valve. Aortic valve regurgitation is not visualized. Mild aortic valve sclerosis is present, with no evidence of aortic valve stenosis.  7. The inferior vena cava is normal in size with greater than 50% respiratory variability, suggesting right atrial pressure of 3 mmHg. FINDINGS  Left Ventricle: Left ventricular ejection fraction, by estimation, is 60 to 65%. The left ventricle has normal function. The left ventricle has no regional wall motion abnormalities. The left ventricular internal cavity size was normal in size. There is  no left ventricular hypertrophy. Left ventricular diastolic parameters are consistent with Grade I diastolic dysfunction (impaired relaxation). Right Ventricle: The right ventricular size is normal. No increase in right ventricular wall thickness. Right ventricular systolic function is normal. There is normal pulmonary artery systolic pressure. The tricuspid regurgitant velocity is 2.02 m/s, and  with an assumed right atrial pressure of 3 mmHg, the estimated right ventricular systolic pressure is 123456 mmHg. Left Atrium: Left atrial size was mildly dilated. Right Atrium: Right atrial size was normal in size. Pericardium: There is no evidence of pericardial effusion. Mitral Valve: The mitral  valve is abnormal. There is mild thickening of the mitral valve leaflet(s). There is mild calcification of the mitral valve leaflet(s). Mild mitral annular calcification. No evidence of mitral valve regurgitation. No evidence of mitral valve stenosis. Tricuspid Valve: The tricuspid valve is normal in structure. Tricuspid valve regurgitation is mild . No evidence of tricuspid stenosis. Aortic Valve: The aortic valve is tricuspid. There is mild calcification of the aortic valve. Aortic valve regurgitation is not visualized. Mild aortic valve sclerosis is present, with no evidence of aortic valve stenosis. Pulmonic Valve: The pulmonic valve was normal in structure. Pulmonic valve  regurgitation is not visualized. No evidence of pulmonic stenosis. Aorta: The aortic root is normal in size and structure. Venous: The inferior vena cava is normal in size with greater than 50% respiratory variability, suggesting right atrial pressure of 3 mmHg. IAS/Shunts: No atrial level shunt detected by color flow Doppler. Additional Comments: Ascites noted on sub costal images Would not appear to be cardiac related.  LEFT VENTRICLE PLAX 2D LVIDd:         3.70 cm  Diastology LVIDs:         2.30 cm  LV e' medial:    7.40 cm/s LV PW:         1.10 cm  LV E/e' medial:  9.8 LV IVS:        1.00 cm  LV e' lateral:   10.40 cm/s LVOT diam:     2.10 cm  LV E/e' lateral: 6.9 LV SV:         63 LV SV Index:   31 LVOT Area:     3.46 cm  RIGHT VENTRICLE             IVC RV Basal diam:  2.30 cm     IVC diam: 1.80 cm RV S prime:     12.10 cm/s TAPSE (M-mode): 2.0 cm LEFT ATRIUM             Index       RIGHT ATRIUM          Index LA diam:        3.80 cm 1.91 cm/m  RA Area:     8.79 cm LA Vol (A2C):   27.3 ml 13.70 ml/m RA Volume:   15.70 ml 7.88 ml/m LA Vol (A4C):   39.1 ml 19.62 ml/m LA Biplane Vol: 33.2 ml 16.66 ml/m  AORTIC VALVE LVOT Vmax:   130.00 cm/s LVOT Vmean:  81.300 cm/s LVOT VTI:    0.180 m  AORTA Ao Root diam: 3.20 cm Ao Asc diam:  3.30  cm MITRAL VALVE                TRICUSPID VALVE MV Area (PHT): 4.31 cm     TR Peak grad:   16.3 mmHg MV Decel Time: 176 msec     TR Vmax:        202.00 cm/s MV E velocity: 72.20 cm/s MV A velocity: 122.00 cm/s  SHUNTS MV E/A ratio:  0.59         Systemic VTI:  0.18 m                             Systemic Diam: 2.10 cm Jenkins Rouge MD Electronically signed by Jenkins Rouge MD Signature Date/Time: 10/06/2020/1:35:19 PM    Final    IR IMAGING GUIDED PORT INSERTION  Result Date: 10/14/2020 CLINICAL DATA:  Primary peritoneal carcinomatosis EXAM: TUNNELED PORT CATHETER PLACEMENT WITH ULTRASOUND AND FLUOROSCOPIC GUIDANCE FLUOROSCOPY TIME:  84 seconds; 38 mGy ANESTHESIA/SEDATION: Intravenous Fentanyl 79mg and Versed 1.'5mg'$  were administered as conscious sedation during continuous monitoring of the patient's level of consciousness and physiological / cardiorespiratory status by the radiology RN, with a total moderate sedation time of 14 minutes. TECHNIQUE: The procedure, risks, benefits, and alternatives were explained to the patient. Questions regarding the procedure were encouraged and answered. The patient understands and consents to the procedure. Patency of the right IJ vein was confirmed with ultrasound with image documentation. An appropriate skin site was determined.  Skin site was marked. Region was prepped using maximum barrier technique including cap and mask, sterile gown, sterile gloves, large sterile sheet, and Chlorhexidine as cutaneous antisepsis. The region was infiltrated locally with 1% lidocaine. Under real-time ultrasound guidance, the right IJ vein was accessed with a 21 gauge micropuncture needle; the needle tip within the vein was confirmed with ultrasound image documentation. Needle was exchanged over a 018 guidewire for transitional dilator, and vascular measurement was performed. A small incision was made on the right anterior chest wall and a subcutaneous pocket fashioned. The power-injectable  port was positioned and its catheter tunneled to the right IJ dermatotomy site. The transitional dilator was exchanged over an Amplatz wire for a peel-away sheath, through which the port catheter, which had been trimmed to the appropriate length, was advanced and positioned under fluoroscopy with its tip at the cavoatrial junction. Spot chest radiograph confirms good catheter position and no pneumothorax. The port was flushed per protocol. The pocket was closed with deep interrupted and subcuticular continuous 3-0 Monocryl sutures. The incisions were covered with Dermabond then covered with a sterile dressing. The patient tolerated the procedure well. COMPLICATIONS: COMPLICATIONS None immediate IMPRESSION: Technically successful right IJ power-injectable port catheter placement. Ready for routine use. Electronically Signed   By: Lucrezia Europe M.D.   On: 10/14/2020 07:59   US Abdomen Limited RUQ (LIVER/GB)  Result Date: 10/05/2020 CLINICAL DATA:  Right upper quadrant abdominal pain EXAM: ULTRASOUND ABDOMEN LIMITED RIGHT UPPER QUADRANT COMPARISON:  10/05/2020 CT abdomen/pelvis FINDINGS: Gallbladder: Nondistended gallbladder is completely filled with a densely shadowing 3.6 cm gallstone. No gallbladder wall thickening. No sonographic Murphy's sign. Common bile duct: Diameter: 6 mm on limited views, top-normal Liver: Coarsened liver parenchyma with questionable liver surface irregularity, which may indicate cirrhosis. No liver mass demonstrated on these limited views. Portal vein is patent on color Doppler imaging with normal direction of blood flow towards the liver. Other: Large volume ascites in the upper abdomen. IMPRESSION: 1. Cholelithiasis.  No evidence of acute cholecystitis. 2. Top normal caliber common bile duct, 6 mm diameter. 3. Coarsened liver parenchyma with questionable liver surface irregularity, which may indicate cirrhosis. No liver mass detected on these limited views. Suggest correlation with liver  function tests. 4. Large volume upper abdominal ascites. Electronically Signed   By: Ilona Sorrel M.D.   On: 10/05/2020 14:37    Labs:  CBC: Recent Labs    10/19/20 0406 10/20/20 0659 10/21/20 0426 10/22/20 0315  WBC 6.7 4.6 4.7 4.2  HGB 11.4* 10.4* 10.9* 11.3*  HCT 37.4 33.0* 34.5* 34.9*  PLT 246 183 208 200    COAGS: Recent Labs    10/05/20 1136  INR 1.1  APTT 30    BMP: Recent Labs    10/19/20 0406 10/20/20 0659 10/21/20 0426 10/22/20 0315  NA 136 135 137 137  K 5.5* 4.4 4.2 4.3  CL 103 105 108 108  CO2 20* 19* 21* 19*  GLUCOSE 99 79 120* 123*  BUN 62* 56* 54* 48*  CALCIUM 9.4 9.0 8.9 8.8*  CREATININE 3.05* 2.53* 2.35* 2.22*  GFRNONAA 16* 21* 22* 24*    LIVER FUNCTION TESTS: Recent Labs    10/19/20 0406 10/20/20 0659 10/21/20 0426 10/22/20 0315  BILITOT 0.8 1.0 0.8 1.0  AST 36 '30 30 28  '$ ALT '22 19 17 19  '$ ALKPHOS 99 86 97 105  PROT 6.3* 5.7* 5.9* 5.5*  ALBUMIN 3.1* 3.2* 3.1* 2.8*    TUMOR MARKERS: No results for input(s): AFPTM, CEA,  CA199, CHROMGRNA in the last 8760 hours.  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 Pt going to Hospice Will try to get peritoneal pleurx done this am. Obviously BP is tenuous on pressor support, will have to be cautious with sedation. Explained procedure and risks to pt. Risks and benefits discussed with the patient including bleeding, infection, damage to adjacent structures, and sepsis.  All of the patient's questions were answered, patient is agreeable to proceed. Consent signed and in chart.   Thank you for this interesting consult.  I greatly enjoyed meeting Lerin Schurz and look forward to participating in their care.  A copy of this report was sent to the requesting provider on this date.  Electronically Signed: Ascencion Dike, PA-C 10/23/2020, 8:53 AM   I spent a total of 20 minutes in face to face in clinical consultation, greater than 50% of which was  counseling/coordinating care for peritoneal pleurx

## 2020-10-23 NOTE — Significant Event (Signed)
Met with patient at bedside.  Other family and friends there but at time of evaluation sister occupied with fixing issue with phone.  Pain has been poorly controlled throughout stay.  Especially about the day.  Ongoing nausea and vomiting.  Likely structural due to carcinomatosis but as well as significant constipation.  She desires her pain to be controlled even in the setting of increased sedation.  We will start low-dose Dilaudid PCA basal 0.25 with 0.25 mg every 30 minutes, maximum dose 0.75 mg in 1 hour.  Discussed turning pressors off as we have been unsuccessful in getting pressure improved large because cannot take oral midodrine and Florinef.  Discussed I worry that the pressors were keeping things going in exposing her to more pain unnecessarily.  She expressed understanding.  We discussed we can continue this discussion tomorrow morning when sister is more available.

## 2020-10-23 NOTE — Progress Notes (Signed)
Ms. Raia is still in the ICU.  She is still on pressors.  Hopefully, she can come off these.  She still does not have the peritoneal drainage catheter in.  Hopefully this will be done today.  She still is throwing up.  She is still not able to eat.  Her abdomen is still silent.  Our goal is to get her to [facility].  I am not sure if we will be able to do this.  She may end up just being in the hospital and passing on while in the hospital.  She is having more problems with pain.  I think a pain patch would not be a bad idea.  I realize this may drop her blood pressure but I really do not think that is an issue given the status of her situation.  She has had no bleeding.  There is a little bit of a cough.  Her pressure is actually doing better today.  It is 111/75.  Again, it would be nice to get her off the pressors.  I think her blood pressure drops too low, this is really not good to be an issue from my point of view says she is a DO NOT RESUSCITATE and if she passes on because of this, and in a lot of respects, this would be a blessing since she would not suffer.  Again, our goal is to try to get her to [facility].  I know she is getting outstanding care from all staff in the ICU.  I appreciate all of their hard work.  Lattie Haw, MD  2 Timothy 4:16-18

## 2020-10-23 NOTE — Progress Notes (Signed)
Palliative Care Progress Note, Assessment & Plan   Patient Name: Beth Sanchez       Date: 10/23/2020 DOB: 04/12/1955  Age: 65 y.o. MRN#: [id number] Attending Physician: Lanier Clam, MD Primary Care Physician: Pcp, No Admit Date: 10/05/2020  Reason for Consultation/Follow-up: Establishing goals of care  Subjective: Patient is sleep on her side. No grimacing, wincing, or signs of pain or discomfort noted.   HPI: 65 y.o. female  with past medical history of HTN, DMII, HLD, and obesity  admitted on 10/05/2020 with abdominal pain, distention, low back pain, emesis, and fatigue.  On admission, patient found to have liver cirrhosis with ascites, AKI, hypotension, mild hyponatermia, and COVID-19.   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 She is being followed by Dr. Martha Clan. She had 1 dose of carboplatinum without improvement. Chemotherapy is on hold given patient's hypotensive state. She is unable to tolerate PO intake without N/V. She has not had a bowel movement since 8/21. She had a PleurX catheter placed this AM in IR with 4.5L output.  Plan of Care: I have reviewed medical records including EPIC notes, labs and imaging, and assessed the patient. I did not awaken her peaceful sleep.   I attempted to speak via telephone with her sister [name] but she did not answer. I left a voicemail.  The patient continues to need BP support and is currently receiving Levo at 4 mcg/hr with her BP remaining low - 94/53. The patient's goal and current plan remains to attempt weaning off of BP support in preparation for placement at [facility].    Palliative Medicine will continue to follow this patient  for symptom management and supportive measures in alignment with patient's goals and wishes.  Code Status: DNR  Prognosis:  Unable to determine  Discharge Planning: Hospice facility  Recommendations/Plan: Wean BP support Transfer to [place] when stable Continue current comfort measures Monitor patient for ongoing symptom management and provide emotional support for patient and family   Length of Stay: 92  Physical Exam Vitals and nursing note reviewed.  Constitutional:      General: She is not in acute distress. HENT:     Head: Normocephalic and atraumatic.  Cardiovascular:     Rate and Rhythm: Normal rate.  Pulmonary:     Effort: Pulmonary effort is normal.  Skin:    General: Skin is warm and dry.            Vital Signs: BP (!) 76/49   Pulse 81   Temp 97.6 F (36.4 C) (Oral)   Resp 15   Ht '5\' 7"'$  (1.702 m)   Wt 87.7 kg   SpO2 100%   BMI 30.28 kg/m  SpO2: SpO2: 100 % O2 Device: O2 Device: Nasal Cannula O2 Flow Rate: O2 Flow Rate (L/min): 4 L/min      Palliative Assessment/Data: 20%       Total Time 25 minutes Prolonged Time Billed  no   Greater than 50%  of this time was spent counseling and coordinating care related to the above assessment and plan.  Thank you for allowing the Palliative Medicine  Team to assist in the care of this patient.  Loistine Chance MD. Patient was seen along with PMT colleague Curt Bears L. Ilsa Iha, FNP-BC Palliative Medicine Team Team Phone # [phone]

## 2020-10-23 NOTE — Procedures (Signed)
Interventional Radiology Procedure Note  Procedure: Tunneled peritoneal PleurX catheter placement  Complications: None  Estimated Blood Loss: <10 mL  Findings: Right sided tunneled PleurX catheter placed in peritoneal cavity. 4.5 L ascites removed after placement.  Venetia Night. Kathlene Cote, M.D Pager:  [phone]

## 2020-10-23 NOTE — Progress Notes (Signed)
NAME:  Beth Sanchez, MRN:  [id number], DOB:  04/18/1955, LOS: 69 ADMISSION DATE:  10/05/2020  CONSULTATION DATE: 10/22/2020 REFERRING MD:  Eliseo Squires - [facility] CHIEF COMPLAINT:  Shock, Hypotension   History of Present Illness:  65 year old woman who presented to [facility] ED 8/15 with back pain, abdominal distention/pain, nausea/vomiting and fatigue. PMHx significant for HTN, T2DM, cirrhosis (new diagnosis this admission), peritoneal carcinomatosis and psoriasis.  Patient notes progressive worsening of symptoms and deterioration for the last 6 months prior to admission.  She was diagnosed with COVID 8/15, found to have new cirrhosis with ascites and was ultimately diagnosed with primary peritoneal carcinomatosis.  She has undergone 5 paracenteses this admission. She received one dose of carboplatin last week, but additional treatment has been limited due to hypotension/inability to tolerate chemotherapy. She has back pain of undetermined duration. Per RN, she had large volume emesis on admission, which has mostly subsided. She was started on midodrine 9/1 but ultimately developed acutely worsening hypotension requiring transfer to the ICU for pressor support.  PCCM consulted for further evaluation and management of hypotension/pressor initiation.   Pertinent Medical History:  HTN, T2DM, cirrhosis (new diagosis), peritoneal carcinomatosis, psoriasis  Significant Hospital Events: Including procedures, antibiotic start and stop dates in addition to other pertinent events   8/15 Admitted to [facility]. COVID + 9/01 PCCM consulted for hypotension requiring pressor initiation, max requirement NE 53mg; weaned down to 352m 9/1AM. Palliative Care [place] the setting of carcinomatosis. Patient made decision for DNR with palliation.  Interim History / Subjective:  Complains of nausea and pain.  Objective:  Blood pressure (!) 86/54, pulse 76, temperature 97.7 F (36.5 C), temperature source Oral,  resp. rate 16, height '5\' 7"'$  (1.702 m), weight 87.7 kg, SpO2 99 %.        Intake/Output Summary (Last 24 hours) at 10/23/2020 0937 Last data filed at 10/23/2020 0856 Gross per 24 hour  Intake 700.02 ml  Output 800 ml  Net -99.98 ml    Filed Weights   10/06/20 0000 10/08/20 1351  Weight: 87.7 kg 87.7 kg   Physical Examination: General: Ill-appearing middle-aged woman in NAD. Appears uncomfortable. HEENT: [place]/AT, anicteric sclera, PERRL, dry mucous membranes. Neuro: Awake, oriented x 4. Responds to verbal stimuli. Following commands consistently. Moves all 4 extremities spontaneously. CV: RRR, no m/g/r. PULM: Breathing even and unlabored on 4LNC. Lung fields CTAB anteriorly, diminished at bilateral bases. GI: Soft, mildly distended, diffuse/generalized mild TTP. Hypoactive bowel sounds. Extremities: Trace LE edema noted. Skin: Warm/dry, no rashes.  Resolved Hospital Problem List:    Assessment & Plan:  Shock, presumed septic (likely source LLL PNA) with component of chronic hypotension Admitted 8/15, COVID+ with likely LLL PNA. Chronically hypotensive with significant worsening noted 8/31PM prompting PCCM consult for ICU transfer and pressor initiation. Finished course abx for Pna. - Goal SBP 80 - Levophed titrated to goal MAP - Resume midodrine and florinef, not receiving due to nausea  Intractable nausea/vomiting: Due to carcinomatosis, severe constipation. - Phenergan scheduled (zofran, compazine ineffective), consider PRN benzo - Patient has consistently refused NGT and bowel regimen, though this would be an option if needed for relief  AKI NAGMA in the setting of AKI - Replete electrolytes as indicated - Monitor I&Os - Avoid nephrotoxic agents as able - Ensure adequate renal perfusion  Peritoneal carcinomatosis with malignant ascites Fluid exudative with lymphocyte predominance. - Appreciate Oncology and Pallative Care assistance with management - Unfortunately, based  on Onc/PMT notes, appears patient is no longer  a candidate for additional chemotherapy, as she has had poor tolerance of medication and little clinical improvement. Prognosis remains very poor. PMT spoke with patient today (9/1) and confirmed wishes for DNR with eventual transition to comfort care. At this time, if the patient's goal is to get out of the hospital and enter hospice care, would continue adjunct medications as above to allow transition out of the hospital. - peritoneal drain placement scheduled 9/2 AM  T2DM, non-insulin dependent - SSI PRN - Holding home Jardiance, metformin given poor PO intake/vomiting - Goal CBG 140-180 while in ICU - Continue dextrose-containing fluids, MV  Anemia, likely due to chronic illness - present on admission - Monitor for signs/symptoms of active bleeding - Trend H&H - Transfuse for Hgb < 7.0  Generalized back and abdominal pain - Continue Dilaudid, APAP for pain control  Best Practice: (right click and "Reselect all SmartList Selections" daily)   Diet/type: full liquids , ADAT DVT prophylaxis: prophylactic heparin  GI prophylaxis: PPI Lines: Central line - indwelling port Foley:  Yes, and it is still needed Code Status:  DNR Last date of multidisciplinary goals of care discussion [9/1 - Palliative Care [place] meeting]  Critical care time: N/A   Lanier Clam, MD [facility] 10/23/20 9:37 AM  Please see [email or web address] for contact details.  From 7A-7P if no response, please call [phone] After hours, please call ELink [phone]

## 2020-10-24 DIAGNOSIS — C482 Malignant neoplasm of peritoneum, unspecified: Secondary | ICD-10-CM | POA: Diagnosis not present

## 2020-10-24 DIAGNOSIS — R180 Malignant ascites: Secondary | ICD-10-CM | POA: Diagnosis not present

## 2020-10-24 DIAGNOSIS — Z515 Encounter for palliative care: Secondary | ICD-10-CM | POA: Diagnosis not present

## 2020-10-24 DIAGNOSIS — Z7189 Other specified counseling: Secondary | ICD-10-CM | POA: Diagnosis not present

## 2020-10-24 MED ORDER — PROCHLORPERAZINE EDISYLATE 10 MG/2ML IJ SOLN
10.0000 mg | Freq: Four times a day (QID) | INTRAMUSCULAR | Status: DC | PRN
  Administered 2020-10-25 – 2020-11-08 (×15): 10 mg via INTRAVENOUS
  Filled 2020-10-24 (×15): qty 2

## 2020-10-24 MED ORDER — PROCHLORPERAZINE EDISYLATE 10 MG/2ML IJ SOLN: 10.0000 mg | INTRAMUSCULAR | Status: DC | PRN

## 2020-10-24 NOTE — Progress Notes (Signed)
[facility] Progress Note Patient Name: Beth Sanchez DOB: 12-01-1955 MRN: [id number]   Date of Service  10/24/2020  HPI/Events of Note  Nausea and vomiting   eICU Interventions  PRN compazine      Intervention Category Intermediate Interventions: Other:  Margaretmary Lombard 10/24/2020, 9:05 PM

## 2020-10-24 NOTE — Progress Notes (Signed)
Ms. Hunke had the paracentesis and peritoneal catheter placed.  She had 4.5 L of fluid removed.  I am sure that the peritoneal catheter will make a huge difference now.  We can just open the catheter up and drain fluid out.  Her abdomen definitely is not as distended..  She is on a PCA pump now.  Her blood pressure is quite low.  She is a little bit lethargic but is very oriented.  She is not hurting.  I do not think she is really eating now.  I am not surprised by this.  She is having no problems with cough or shortness of breath.  Again, she seems quite comfortable.  She is still on the Levophed.  Hopefully, this will be able to be titrated off.  I am just grateful for the wonderful care that she is getting in the ICU.  Her vital signs are temperature of 97.5.  Pulse 102.  Blood pressure 90/70.  Her lungs sound clear.  She has good air movement bilaterally.  Cardiac exam is tachycardic and regular.  Abdomen is not as distended.  There are no bowel sounds.  The peritoneal catheter is in place.  Extremity shows some symmetric muscle atrophy in upper and lower extremities.  Neurological exam is nonfocal.  At this point, everything is being focused on end-of-life care.  She is comfortable.  Again it would be nice to try to get her to [facility].  I am unsure if she will be able to make it there.  I do appreciate the outstanding care and all of the hardware at that she is getting from the staff in the ICU.    Lattie Haw, MD  Colossians 3:23

## 2020-10-24 NOTE — Progress Notes (Signed)
Palliative Care Progress Note, Assessment & Plan   Patient Name: Beth Sanchez       Date: 10/24/2020 DOB: 1955-06-18  Age: 65 y.o. MRN#: [id number] Attending Physician: Lanier Clam, MD Primary Care Physician: Pcp, No Admit Date: 10/05/2020  Reason for Consultation/Follow-up: Establishing goals of care  Subjective: Patient is awake this morning, resting on her side.  She is now on low-dose PCA for pain, remains on pressors.  At times she does have some pain/discomfort denies any acute pain.  Patient states that she is spiritually at peace and is aware of her markedly limited prognosis.  She is afraid that her sister is having a hard time with her ongoing decline and is worried about how her sister will do after her death.  We spent some time discussing about patient's disease trajectory thus far and scope of care that is being provided here in the hospital.  Offered active listening and supportive presence.  Discussed with PCCM colleague.   HPI: 65 y.o. female  with past medical history of HTN, DMII, HLD, and obesity  admitted on 10/05/2020 with abdominal pain, distention, low back pain, emesis, and fatigue.  On admission, patient found to have liver cirrhosis with ascites, AKI, hypotension, mild hyponatermia, and COVID-19.   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 She is being followed by Dr. Martha Clan. She had 1 dose of carboplatinum without improvement. Chemotherapy is on hold given patient's hypotensive state. She is unable to tolerate PO intake without N/V. She has not had a bowel movement since 8/21. She had a PleurX catheter placed in IR with 4.5L output.  Plan of Care: I have reviewed  medical records including EPIC notes, labs and imaging, and assessed the patient.  Continue current scope of comfort measures. Palliative Medicine will continue to follow this patient for symptom management and supportive measures in alignment with patient's goals and wishes.  Code Status: DNR  Prognosis:  Unable to determine  Discharge Planning: Hospice facility Versus anticipated hospital death if pressors are not able to be weaned off. Recommendations/Plan: Wean BP support Transfer to [place] when stable Continue current comfort measures Monitor patient for ongoing symptom management and provide emotional support for patient and family   Length of Stay: 24  Physical Exam Vitals and nursing note reviewed.  Constitutional:      General: She is not in acute distress. HENT:     Head: Normocephalic and atraumatic.  Cardiovascular:     Rate and Rhythm: Normal rate.  Pulmonary:     Effort: Pulmonary effort is normal.  Skin:    General: Skin is warm and dry.            Vital Signs: BP 94/73   Pulse 98   Temp 97.8 F (36.6 C) (Axillary)   Resp 17   Ht '5\' 7"'$  (1.702 m)   Wt 87.7 kg   SpO2 98%   BMI 30.28 kg/m  SpO2: SpO2: 98 % O2 Device: O2 Device: Nasal Cannula O2 Flow Rate: O2 Flow Rate (L/min): 4 L/min      Palliative Assessment/Data: 20%  Total Time 25 minutes Prolonged Time Billed  no   Greater than 50%  of this time was spent counseling and coordinating care related to the above assessment and plan.  Thank you for allowing the Palliative Medicine Team to assist in the care of this patient.  Loistine Chance MD.  Palliative Medicine Team Team Phone # [phone]

## 2020-10-24 NOTE — Progress Notes (Signed)
NAME:  Beth Sanchez, MRN:  [id number], DOB:  08/16/1955, LOS: 32 ADMISSION DATE:  10/05/2020  CONSULTATION DATE: 10/22/2020 REFERRING MD:  Eliseo Squires - TRH CHIEF COMPLAINT:  Shock, Hypotension   History of Present Illness:  65 year old woman who presented to [facility] ED 8/15 with back pain, abdominal distention/pain, nausea/vomiting and fatigue. PMHx significant for HTN, T2DM, cirrhosis (new diagnosis this admission), peritoneal carcinomatosis and psoriasis.  Patient notes progressive worsening of symptoms and deterioration for the last 6 months prior to admission.  She was diagnosed with COVID 8/15, found to have new cirrhosis with ascites and was ultimately diagnosed with primary peritoneal carcinomatosis.  She has undergone 5 paracenteses this admission. She received one dose of carboplatin last week, but additional treatment has been limited due to hypotension/inability to tolerate chemotherapy. She has back pain of undetermined duration. Per RN, she had large volume emesis on admission, which has mostly subsided. She was started on midodrine 9/1 but ultimately developed acutely worsening hypotension requiring transfer to the ICU for pressor support.  PCCM consulted for further evaluation and management of hypotension/pressor initiation.   Pertinent Medical History:  HTN, T2DM, cirrhosis (new diagosis), peritoneal carcinomatosis, psoriasis  Significant Hospital Events: Including procedures, antibiotic start and stop dates in addition to other pertinent events   8/15 Admitted to [facility]. COVID + 9/01 PCCM consulted for hypotension requiring pressor initiation, max requirement NE 30mg; weaned down to 315m 9/1AM. Palliative Care contacted for GO[place]iscussion in the setting of carcinomatosis. Patient made decision for DNR with palliation. 9/2 unable to wean pressors, not taking p.o. very much, added scheduled Phenergan with some improvement, abdominal drain placed, low-dose hydromorphone continuous with PCA  added with improved symptoms  Interim History / Subjective:  Pain better, not using as needed dose on PCA  Objective:  Blood pressure 94/73, pulse 98, temperature (!) 97.4 F (36.3 C), temperature source Axillary, resp. rate 17, height '5\' 7"'  (1.702 m), weight 87.7 kg, SpO2 98 %.    FiO2 (%):  [80 %-99 %] 80 %   Intake/Output Summary (Last 24 hours) at 10/24/2020 1402 Last data filed at 10/24/2020 1103 Gross per 24 hour  Intake 1229.98 ml  Output 1175 ml  Net 54.98 ml    Filed Weights   10/06/20 0000 10/08/20 1351  Weight: 87.7 kg 87.7 kg   Physical Examination: General: Ill-appearing middle-aged woman in NAD. Appears uncomfortable. HEENT: [place]/AT, anicteric sclera, PERRL, dry mucous membranes. Neuro: Awake, oriented x 4. Responds to verbal stimuli. Following commands consistently. Moves all 4 extremities spontaneously. CV: RRR, no m/g/r. PULM: Breathing even and unlabored on 4LNC. Lung fields CTAB anteriorly, diminished at bilateral bases. GI: Soft, mildly distended, diffuse/generalized mild TTP. Hypoactive bowel sounds. Extremities: Trace LE edema noted. Skin: Warm/dry, no rashes.  Resolved Hospital Problem List:    Assessment & Plan:  Shock, presumed septic (likely source LLL PNA) with component of chronic hypotension Admitted 8/15, COVID+ with likely LLL PNA. Chronically hypotensive with significant worsening noted 8/31PM prompting PCCM consult for ICU transfer and pressor initiation. Finished course abx for Pna. - Goal SBP 80 - Levophed titrated to goal MAP - Resume midodrine and florinef, able to get some doses in today  Intractable nausea/vomiting: Due to carcinomatosis, severe constipation. - Phenergan scheduled (zofran, compazine ineffective), consider PRN benzo - Patient has consistently refused NGT and bowel regimen, though this would be an option if needed for relief  AKI NAGMA in the setting of AKI - Replete electrolytes as indicated - Monitor I&Os - Avoid  nephrotoxic agents as able - Ensure adequate renal perfusion  Peritoneal carcinomatosis with malignant ascites Fluid exudative with lymphocyte predominance. - Appreciate Oncology and Pallative Care assistance with management - Unfortunately, based on Onc/PMT notes, appears patient is no longer a candidate for additional chemotherapy, as she has had poor tolerance of medication and little clinical improvement. Prognosis remains very poor. PMT spoke with patient today (9/1) and confirmed wishes for DNR with eventual transition to comfort care. At this time, if the patient's goal is to get out of the hospital and enter hospice care, would continue adjunct medications as above to allow transition out of the hospital. - peritoneal drain placed 9/2   T2DM, non-insulin dependent - SSI PRN - Holding home Jardiance, metformin given poor PO intake/vomiting  Anemia, likely due to chronic illness - present on admission - Monitor for signs/symptoms of active bleeding - Trend H&H - Transfuse for Hgb < 7.0  Generalized back and abdominal pain -Dilaudid PCA 0.25 mg continuous, 0.25 mg every 30 minutes as needed, not using as needed dose, continue for now  Best Practice: (right click and "Reselect all SmartList Selections" daily)   Diet/type: full liquids , ADAT DVT prophylaxis: prophylactic heparin  GI prophylaxis: PPI Lines: Central line - indwelling port Foley:  Yes, and it is still needed Code Status:  DNR Last date of multidisciplinary goals of care discussion [9/3 met with patient and sister at bedside, discussed difficulty weaning off pressors, suggested that is unlikely she will would leave hospital and likely would die in the hospital.  Not sure if [facility] is realistic.  Just now getting oral doses of midodrine.  Will reevaluate 24 hours utility of pressors versus discontinuing and allowing a natural death as to not prolong pain which is ultimately her goal.]  Critical care time: N/A    Lanier Clam, MD [facility] 10/24/20 2:02 PM  Please see [email or web address] for contact details.  From 7A-7P if no response, please call [phone] After hours, please call ELink [phone]

## 2020-10-25 DIAGNOSIS — R180 Malignant ascites: Secondary | ICD-10-CM | POA: Diagnosis not present

## 2020-10-25 DIAGNOSIS — Z7189 Other specified counseling: Secondary | ICD-10-CM | POA: Diagnosis not present

## 2020-10-25 DIAGNOSIS — Z515 Encounter for palliative care: Secondary | ICD-10-CM | POA: Diagnosis not present

## 2020-10-25 DIAGNOSIS — R112 Nausea with vomiting, unspecified: Secondary | ICD-10-CM | POA: Diagnosis not present

## 2020-10-25 DIAGNOSIS — N179 Acute kidney failure, unspecified: Secondary | ICD-10-CM | POA: Diagnosis not present

## 2020-10-25 NOTE — Progress Notes (Signed)
Palliative Care Progress Note, Assessment & Plan   Patient Name: Beth Sanchez       Date: 10/25/2020 DOB: Oct 28, 1955  Age: 65 y.o. MRN#: [id number] Attending Physician: Lanier Clam, MD Primary Care Physician: Pcp, No Admit Date: 10/05/2020  Reason for Consultation/Follow-up: Establishing goals of care  Subjective: Patient is awake alert no distress, sister patty is at bedside, she brought in soup for the patient. Patient with nausea off/on. Denies pain, remains on low dose PCA and pressors.      HPI: 65 y.o. female  with past medical history of HTN, DMII, HLD, and obesity  admitted on 10/05/2020 with abdominal pain, distention, low back pain, emesis, and fatigue.  On admission, patient found to have liver cirrhosis with ascites, AKI, hypotension, mild hyponatermia, and COVID-19.   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 She is being followed by Dr. Martha Clan. She had 1 dose of carboplatinum without improvement. Chemotherapy is on hold given patient's hypotensive state. She is unable to tolerate PO intake without N/V. She has not had a bowel movement since 8/21. She had a PleurX catheter placed in IR with 4.5L output.  Plan of Care: I have reviewed medical records including EPIC notes, labs and imaging, and assessed the patient.  Continue current scope of comfort measures. Palliative Medicine will continue to follow this patient for symptom management and supportive measures in alignment with patient's goals and wishes. Discussed with sister outside the room. She requests for ongoing current medical care. Patient to facetime and visit with as many of her relatives as possible.  Continue to  wean pressors if possible, sister not in favor of d/c them at present.  Monitor hospital course and overall trajectory of illness so as to determine if transfer to [facility] is appropriate or not.   Code Status: DNR  Prognosis:  Unable to determine  Discharge Planning: Hospice facility Versus anticipated hospital death if pressors are not able to be weaned off. Recommendations/Plan: Wean BP support if able.  Transfer to [place] if able. Continue current comfort measures Monitor patient for ongoing symptom management and provide emotional support for patient and family   Length of Stay: 20  Physical Exam Vitals and nursing note reviewed.  Constitutional:      General: She is not in acute distress. HENT:     Head: Normocephalic and atraumatic.  Cardiovascular:     Rate and Rhythm: Normal rate.  Pulmonary:     Effort: Pulmonary effort is normal.  Skin:    General: Skin is warm and dry.            Vital Signs: BP (!) 85/58   Pulse 99   Temp (!) 96.6 F (35.9 C) (Oral)   Resp 11   Ht '5\' 7"'$  (1.702 m)   Wt 87.7 kg   SpO2 100%   BMI 30.28 kg/m  SpO2: SpO2: 100 % O2 Device: O2 Device: Nasal Cannula O2 Flow Rate: O2 Flow Rate (L/min): 4 L/min      Palliative Assessment/Data: 20%       Total Time 35 minutes Prolonged Time Billed  no  Greater than 50%  of this time was spent counseling and coordinating care related to the above assessment and plan.  Thank you for allowing the Palliative Medicine Team to assist in the care of this patient.  Loistine Chance MD.  Palliative Medicine Team Team Phone # [phone]  PMT is available at [phone] from 7AM to 7 PM. After 7 PM, please page the primary service.

## 2020-10-25 NOTE — Progress Notes (Signed)
NAME:  Beth Sanchez, MRN:  [id number], DOB:  [birth year], LOS: 11 ADMISSION DATE:  10/05/2020  CONSULTATION DATE: 10/22/2020 REFERRING MD:  Eliseo Squires - TRH CHIEF COMPLAINT:  Shock, Hypotension   History of Present Illness:  65 year old woman who presented to [facility] ED 8/15 with back pain, abdominal distention/pain, nausea/vomiting and fatigue. PMHx significant for HTN, T2DM, cirrhosis (new diagnosis this admission), peritoneal carcinomatosis and psoriasis.  Patient notes progressive worsening of symptoms and deterioration for the last 6 months prior to admission.  She was diagnosed with COVID 8/15, found to have new cirrhosis with ascites and was ultimately diagnosed with primary peritoneal carcinomatosis.  She has undergone 5 paracenteses this admission. She received one dose of carboplatin last week, but additional treatment has been limited due to hypotension/inability to tolerate chemotherapy. She has back pain of undetermined duration. Per RN, she had large volume emesis on admission, which has mostly subsided. She was started on midodrine 9/1 but ultimately developed acutely worsening hypotension requiring transfer to the ICU for pressor support.  PCCM consulted for further evaluation and management of hypotension/pressor initiation.   Pertinent Medical History:  HTN, T2DM, cirrhosis (new diagosis), peritoneal carcinomatosis, psoriasis  Significant Hospital Events: Including procedures, antibiotic start and stop dates in addition to other pertinent events   8/15 Admitted to [facility]. COVID + 9/01 PCCM consulted for hypotension requiring pressor initiation, max requirement NE 79mg; weaned down to 337m 9/1AM. Palliative Care contacted for [place] in the setting of carcinomatosis. Patient made decision for DNR with palliation. 9/2 unable to wean pressors, not taking p.o. very much, added scheduled Phenergan with some improvement, abdominal drain placed, low-dose hydromorphone continuous with PCA  added with improved symptoms 9/3 pain better  Interim History / Subjective:  Pain better, not using as needed dose on PCA. Taking some oral meds  Objective:  Blood pressure 97/69, pulse 99, temperature (!) 96.6 F (35.9 C), temperature source Oral, resp. rate (!) 21, height '5\' 7"'  (1.702 m), weight 87.7 kg, SpO2 97 %.    FiO2 (%):  [0 %-80 %] 0 %   Intake/Output Summary (Last 24 hours) at 10/25/2020 0949 Last data filed at 10/25/2020 0600 Gross per 24 hour  Intake 1331.18 ml  Output 750 ml  Net 581.18 ml    Filed Weights   10/06/20 0000 10/08/20 1351  Weight: 87.7 kg 87.7 kg   Physical Examination: General: Ill-appearing middle-aged woman in NAD. Appears uncomfortable. HEENT: [place]/AT, anicteric sclera, PERRL, dry mucous membranes. Neuro: Awake, oriented x 4. Responds to verbal stimuli. Following commands consistently. Moves all 4 extremities spontaneously. CV: RRR, no m/g/r. PULM: Breathing even and unlabored on 4LNC. Lung fields CTAB anteriorly, diminished at bilateral bases. GI: Soft, mildly distended, diffuse/generalized mild TTP. Hypoactive bowel sounds. Extremities: Trace LE edema noted. Skin: Warm/dry, no rashes.  Resolved Hospital Problem List:    Assessment & Plan:  Shock, presumed septic (likely source LLL PNA) with component of chronic hypotension Admitted 8/15, COVID+ with likely LLL PNA. Chronically hypotensive with significant worsening noted 8/31PM prompting PCCM consult for ICU transfer and pressor initiation. Finished course abx for Pna. - Goal SBP 80 - Levophed titrated to goal MAP - Resume midodrine and florinef  Intractable nausea/vomiting: Due to carcinomatosis, severe constipation. - Phenergan scheduled (zofran, compazine ineffective), PRN compazine - Patient has consistently refused NGT and bowel regimen, though this would be an option if needed for relief  AKI NAGMA in the setting of AKI - Replete electrolytes as indicated - Monitor I&Os - Avoid  nephrotoxic agents as able - Ensure adequate renal perfusion  Peritoneal carcinomatosis with malignant ascites Fluid exudative with lymphocyte predominance. - Appreciate Oncology and Pallative Care assistance with management - Unfortunately, based on Onc/PMT notes, appears patient is no longer a candidate for additional chemotherapy, as she has had poor tolerance of medication and little clinical improvement. Prognosis remains very poor. PMT spoke with patient today (9/1) and confirmed wishes for DNR with eventual transition to comfort care. At this time, if the patient's goal is to get out of the hospital and enter hospice care, would continue adjunct medications as above to allow transition out of the hospital. - peritoneal drain placed 9/2   T2DM, non-insulin dependent - SSI PRN - Holding home Jardiance, metformin given poor PO intake/vomiting  Anemia, likely due to chronic illness - present on admission - Monitor for signs/symptoms of active bleeding - Trend H&H - Transfuse for Hgb < 7.0  Generalized back and abdominal pain -Dilaudid PCA 0.25 mg continuous, 0.25 mg every 30 minutes as needed, not using as needed dose, continue for now  Best Practice: (right click and "Reselect all SmartList Selections" daily)   Diet/type: full liquids , ADAT DVT prophylaxis: prophylactic heparin  GI prophylaxis: PPI Lines: Central line - indwelling port Foley:  Yes, and it is still needed Code Status:  DNR Last date of multidisciplinary goals of care discussion [9/3 met with patient and sister at bedside, discussed difficulty weaning off pressors, suggested that is unlikely she will would leave hospital and likely would die in the hospital.  Not sure if [facility] is realistic.  Just now getting oral doses of midodrine.  Will reevaluate 24 hours utility of pressors versus discontinuing and allowing a natural death as to not prolong pain which is ultimately her goal.]  Critical care time: N/A    Lanier Clam, MD [facility] 10/25/20 9:49 AM  Please see [email or web address] for contact details.  From 7A-7P if no response, please call [phone] After hours, please call ELink [phone]

## 2020-10-26 DIAGNOSIS — C482 Malignant neoplasm of peritoneum, unspecified: Secondary | ICD-10-CM | POA: Diagnosis not present

## 2020-10-26 DIAGNOSIS — R180 Malignant ascites: Secondary | ICD-10-CM | POA: Diagnosis not present

## 2020-10-26 DIAGNOSIS — Z7189 Other specified counseling: Secondary | ICD-10-CM | POA: Diagnosis not present

## 2020-10-26 DIAGNOSIS — Z515 Encounter for palliative care: Secondary | ICD-10-CM | POA: Diagnosis not present

## 2020-10-26 NOTE — Progress Notes (Signed)
Palliative Care Progress Note, Assessment & Plan   Patient Name: Beth Sanchez       Date: 10/26/2020 DOB: [birth year]  Age: 65 y.o. MRN#: [id number] Attending Physician: Lanier Clam, MD Primary Care Physician: Pcp, No Admit Date: 10/05/2020  Reason for Consultation/Follow-up: Establishing goals of care  Subjective: Patient is awake alert no distress   Patient with nausea pain off/on.   Oral intake is minimal  remains on low dose PCA and pressors.   Patient connected via Face Time with several family members yesterday, she states, "in the end,its all going to be ok in the end. I am taking it one day at a time." Offered active listening and compassionate presence and support as patient continues to decline from her serious illness and has an escalating symptom burden.      HPI: 65 y.o. female  with past medical history of HTN, DMII, HLD, and obesity  admitted on 10/05/2020 with abdominal pain, distention, low back pain, emesis, and fatigue.  On admission, patient found to have liver cirrhosis with ascites, AKI, hypotension, mild hyponatermia, and COVID-19.   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 She is being followed by Dr. Martha Clan. She had 1 dose of carboplatinum without improvement. Chemotherapy is on hold given patient's hypotensive state. She is unable to tolerate PO intake without N/V.  She had a PleurX catheter placed in IR with 4.5L output.  Plan of Care: I have reviewed medical records including EPIC notes, labs and imaging, and assessed the patient.  Continue current scope of comfort measures. Palliative Medicine will continue to follow this patient for symptom management and  supportive measures in alignment with patient's goals and wishes.   Continue to wean pressors if possible, sister not in favor of d/c them at present.  Monitor hospital course and overall trajectory of illness so as to determine if transfer to [facility] appropriate or not in the upcoming few days.   Code Status: DNR  Prognosis:  Guarded, could be 2 weeks or less.    Discharge Planning: Hospice facility Versus anticipated hospital death if pressors are not able to be weaned off. Recommendations/Plan: Wean BP support if able.  Transfer to [place] if able. Continue current comfort measures Monitor patient for ongoing symptom management and provide emotional support for patient and family   Length of Stay: 89  Physical Exam Vitals and nursing note reviewed.  Constitutional:      General: She is not in acute distress. HENT:     Head: Normocephalic and atraumatic.  Cardiovascular:     Rate and Rhythm: Normal rate.  Pulmonary:     Effort: Pulmonary effort is normal.  Skin:    General: Skin is warm and dry.            Vital Signs: BP 94/78   Pulse (!) 103   Temp (!) 96.2 F (35.7 C) (Axillary)   Resp (!) 24   Ht '5\' 7"'$  (1.702 m)   Wt 87.7 kg   SpO2 97%   BMI 30.28 kg/m  SpO2: SpO2: 97 % O2 Device: O2 Device: Nasal Cannula O2  Flow Rate: O2 Flow Rate (L/min): 4 L/min      Palliative Assessment/Data: 20%       Total Time 15 minutes Prolonged Time Billed  no   Greater than 50%  of this time was spent counseling and coordinating care related to the above assessment and plan.  Thank you for allowing the Palliative Medicine Team to assist in the care of this patient.  Loistine Chance MD.  Palliative Medicine Team Team Phone # [phone]  PMT is available at [phone] from 7AM to 7 PM. After 7 PM, please page the primary service.

## 2020-10-26 NOTE — Progress Notes (Signed)
Nutrition Brief Note  Chart reviewed. Patient assessed by a RD on 8/31. Patient now transitioning to comfort care with PPS 20%. Plan is for d/c to [facility], if feasible although notes state possible hospital death. Patient is NPO.   No further nutrition interventions planned at this time. Please re-consult as needed.      Jarome Matin, MS, RD, LDN, CNSC Inpatient Clinical Dietitian RD pager # available in [facility]  After hours/weekend pager # available in [facility]

## 2020-10-26 NOTE — Progress Notes (Signed)
NAME:  Beth Sanchez, MRN:  [id number], DOB:  Aug 27, 1955, LOS: 21 ADMISSION DATE:  10/05/2020  CONSULTATION DATE: 10/22/2020 REFERRING MD:  Eliseo Squires - TRH CHIEF COMPLAINT:  Shock, Hypotension   History of Present Illness:  65 year old woman who presented to [facility] ED 8/15 with back pain, abdominal distention/pain, nausea/vomiting and fatigue. PMHx significant for HTN, T2DM, cirrhosis (new diagnosis this admission), peritoneal carcinomatosis and psoriasis.  Patient notes progressive worsening of symptoms and deterioration for the last 6 months prior to admission.  She was diagnosed with COVID 8/15, found to have new cirrhosis with ascites and was ultimately diagnosed with primary peritoneal carcinomatosis.  She has undergone 5 paracenteses this admission. She received one dose of carboplatin last week, but additional treatment has been limited due to hypotension/inability to tolerate chemotherapy. She has back pain of undetermined duration. Per RN, she had large volume emesis on admission, which has mostly subsided. She was started on midodrine 9/1 but ultimately developed acutely worsening hypotension requiring transfer to the ICU for pressor support.  PCCM consulted for further evaluation and management of hypotension/pressor initiation.   Pertinent Medical History:  HTN, T2DM, cirrhosis (new diagosis), peritoneal carcinomatosis, psoriasis  Significant Hospital Events: Including procedures, antibiotic start and stop dates in addition to other pertinent events   8/15 Admitted to [facility] COVID + 9/01 PCCM consulted for hypotension requiring pressor initiation, max requirement NE 42mg; weaned down to 350m 9/1AM. Palliative Care contacted [place] in the setting of carcinomatosis. Patient made decision for DNR with palliation. 9/2 unable to wean pressors, not taking p.o. very much, added scheduled Phenergan with some improvement, abdominal drain placed, low-dose hydromorphone continuous with PCA  added with improved symptoms 9/3 pain better 9/4 weaned pressors  Interim History / Subjective:  Off pressors, consider beacon place tomorrow if able  Objective:  Blood pressure 94/78, pulse (!) 103, temperature (!) 96.2 F (35.7 C), temperature source Axillary, resp. rate (!) 24, height '5\' 7"'  (1.702 m), weight 87.7 kg, SpO2 97 %.    FiO2 (%):  [0 %] 0 %   Intake/Output Summary (Last 24 hours) at 10/26/2020 1319 Last data filed at 10/26/2020 0915 Gross per 24 hour  Intake 1322.61 ml  Output 150 ml  Net 1172.61 ml    Filed Weights   10/06/20 0000 10/08/20 1351  Weight: 87.7 kg 87.7 kg   Physical Examination: General: Ill-appearing middle-aged woman in NAD.. Marland KitchenEENT: [employer]/AT, anicteric sclera, PERRL, dry mucous membranes. Neuro: Awake, oriented x 4. Responds to verbal stimuli. Following commands consistently. Moves all 4 extremities spontaneously. CV: RRR, no m/g/r. PULM: Breathing even and unlabored on 4LNC. Lung fields CTAB anteriorly, diminished at bilateral bases. GI: Soft, mildly distended, diffuse/generalized mild TTP. Hypoactive bowel sounds. Extremities: Trace LE edema noted. Skin: Warm/dry, no rashes.  Resolved Hospital Problem List:    Assessment & Plan:  Shock, presumed septic (likely source LLL PNA) with component of chronic hypotension Admitted 8/15, COVID+ with likely LLL PNA. Chronically hypotensive with significant worsening noted 8/31PM prompting PCCM consult for ICU transfer and pressor initiation. Finished course abx for Pna. - Goal SBP 80 - Levophed titrated off - Continue midodrine and florinef  Intractable nausea/vomiting: Due to carcinomatosis, severe constipation. - Phenergan scheduled (zofran, compazine ineffective), PRN compazine - Patient has consistently refused NGT and bowel regimen, though this would be an option if needed for relief  AKI NAGMA in the setting of AKI - Replete electrolytes as indicated - Monitor I&Os - Avoid nephrotoxic agents  as able - Ensure  adequate renal perfusion  Peritoneal carcinomatosis with malignant ascites Fluid exudative with lymphocyte predominance. - Appreciate Oncology and Pallative Care assistance with management - Unfortunately, based on Onc/PMT notes, appears patient is no longer a candidate for additional chemotherapy, as she has had poor tolerance of medication and little clinical improvement. Prognosis remains very poor. PMT spoke with patient today (9/1) and confirmed wishes for DNR with eventual transition to comfort care. At this time, if the patient's goal is to get out of the hospital and enter hospice care, would continue adjunct medications as above to allow transition out of the hospital. - peritoneal drain placed 9/2   T2DM, non-insulin dependent - SSI PRN - Holding home Jardiance, metformin given poor PO intake/vomiting  Anemia, likely due to chronic illness - present on admission - Monitor for signs/symptoms of active bleeding - Trend H&H - Transfuse for Hgb < 7.0  Generalized back and abdominal pain -Dilaudid PCA 0.25 mg continuous, 0.25 mg every 30 minutes as needed, not using as needed dose, continue for now  Best Practice: (right click and "Reselect all SmartList Selections" daily)   Diet/type: full liquids , ADAT DVT prophylaxis: prophylactic heparin  GI prophylaxis: PPI Lines: Central line - indwelling port Foley:  Yes, and it is still needed Code Status:  DNR Last date of multidisciplinary goals of care discussion [9/4 met with patient and sister at bedside, discussed difficulty weaning off pressors, suggested that is unlikely she will would leave hospital and likely would die in the hospital.  Not sure if beacon place is realistic.  They want time for people to visit.Marland Kitchen]  Critical care time: N/A   Lanier Clam, MD [facility] 10/26/20 1:19 PM  Please see [email or web address] for contact details.  From 7A-7P if no response, please call  [phone] After hours, please call ELink [phone]

## 2020-10-27 ENCOUNTER — Encounter (HOSPITAL_COMMUNITY): Payer: Self-pay | Admitting: Internal Medicine

## 2020-10-27 DIAGNOSIS — G893 Neoplasm related pain (acute) (chronic): Secondary | ICD-10-CM

## 2020-10-27 DIAGNOSIS — Z7189 Other specified counseling: Secondary | ICD-10-CM | POA: Diagnosis not present

## 2020-10-27 DIAGNOSIS — R180 Malignant ascites: Secondary | ICD-10-CM | POA: Diagnosis not present

## 2020-10-27 DIAGNOSIS — C482 Malignant neoplasm of peritoneum, unspecified: Secondary | ICD-10-CM | POA: Diagnosis not present

## 2020-10-27 DIAGNOSIS — K567 Ileus, unspecified: Secondary | ICD-10-CM | POA: Diagnosis not present

## 2020-10-27 DIAGNOSIS — R579 Shock, unspecified: Secondary | ICD-10-CM | POA: Diagnosis not present

## 2020-10-27 LAB — CULTURE, BLOOD (ROUTINE X 2)
Culture: NO GROWTH
Culture: NO GROWTH
Special Requests: ADEQUATE

## 2020-10-27 LAB — CORTISOL: Cortisol, Plasma: 44.8 ug/dL

## 2020-10-27 MED ORDER — HYDROCORTISONE NA SUCCINATE PF 100 MG IJ SOLR
100.0000 mg | Freq: Two times a day (BID) | INTRAMUSCULAR | Status: DC
  Administered 2020-10-27 – 2020-10-29 (×4): 100 mg via INTRAVENOUS
  Filled 2020-10-27 (×3): qty 2

## 2020-10-27 NOTE — Progress Notes (Signed)
Actually, Ms. Elmer looks and sounds pretty good today.  She is still on the Levophed.  Looks like this is very low-dose.  Hopefully, this will be stopped today.  I am not sure if and when the abdominal fluid has been taken out of the peritoneal catheter.  Her abdomen does not look all that distended.  She has some discomfort.  I think she is on the PCA morphine.  She is still not eating.  She is having some the drink.  Again this is not all that much.  I was apprised as how good she looks.  Again she is quite alert.  She seems oriented.  When I was with her this morning, her blood pressure was 94/62.  She has had a little bit of a cough.  There is no bleeding.  She has had no bowel movement.  She has had no urinary difficulties.  I am unsure exactly what the goal is with respect to the Levophed infusion.  I would like to believe that this can be stopped.  If her blood pressure drops, this, from my perspective, is not that big of a deal.  It is still be nice to try to get her to [facility].  I am not sure if this will happen.  At least, she appears to be comfortable.  I know that the staff in the ICU are doing a fantastic job with her.  Lattie Haw, MD  [place] 11:28

## 2020-10-27 NOTE — Progress Notes (Signed)
NAME:  Beth Sanchez, MRN:  [id number], DOB:  [birth year], LOS: 34 ADMISSION DATE:  10/05/2020  CONSULTATION DATE: 10/22/2020 REFERRING MD:  Eliseo Squires - TRH CHIEF COMPLAINT:  Shock, Hypotension   BRIEF  65 year old woman who presented to [facility] ED 8/15 with back pain, abdominal distention/pain, nausea/vomiting and fatigue. PMHx significant for HTN, T2DM, cirrhosis (new diagnosis this admission), peritoneal carcinomatosis and psoriasis.  Patient notes progressive worsening of symptoms and deterioration for the last 6 months prior to admission.  She was diagnosed with COVID 8/15, found to have new cirrhosis with ascites and was ultimately diagnosed with primary peritoneal carcinomatosis.  She has undergone 5 paracenteses this admission. She received one dose of carboplatin last week, but additional treatment has been limited due to hypotension/inability to tolerate chemotherapy. She has back pain of undetermined duration. Per RN, she had large volume emesis on admission, which has mostly subsided. She was started on midodrine 9/1 but ultimately developed acutely worsening hypotension requiring transfer to the ICU for pressor support.  PCCM consulted for further evaluation and management of hypotension/pressor initiation.   Pertinent Medical History:  HTN, T2DM, cirrhosis (new diagosis), peritoneal carcinomatosis, psoriasis   has a past medical history of AKI (acute kidney injury) ([place]) (10/05/2020), Cancer associated pain (10/05/2020), Cirrhosis of liver ([place]) (10/05/2020), Comfort measures only status (10/22/2020), COVID-19 virus infection (10/05/2020), DNR (do not resuscitate) (10/22/2020), Failure to thrive in adult (10/05/2020), Goals of care, counseling/discussion (10/12/2020), Hyperlipidemia associated with type 2 diabetes mellitus ([place]) (10/23/2016), Hypertension, Iron deficiency anemia (10/05/2020), Malignant ascites (10/05/2020), N&V (nausea and vomiting) (10/05/2020), Ovarian cancer on right ([place]) (10/05/2020),  Palliative care by specialist (10/21/2020), Primary peritoneal carcinomatosis ([place]) (10/12/2020), Primary peritoneal carcinomatosis ([place]) (10/12/2020), Protein-calorie malnutrition, moderate ([place]) (10/05/2020), Psoriasis, PSORIASIS (03/28/2007), Shock circulatory ([place]) (10/22/2020), and Type 2 diabetes mellitus with hyperglycemia ([place]) (09/10/2020).   has a past surgical history that includes Esophagogastroduodenoscopy (egd) with propofol (N/A, 10/08/2020); biopsy (10/08/2020); IR IMAGING GUIDED PORT INSERTION (10/13/2020); and IR IMAGE GUIDED DRAINAGE PERCUT CATH  PERITONEAL RETROPERIT (10/23/2020).   Significant Hospital Events: Including procedures, antibiotic start and stop dates in addition to other pertinent events   8/15 Admitted to [facility] COVID +.  CT abdomen lung image -normal lung fields 10/07/2020: CA125 extremely high at 7200.  Malignant ascites.  CT chest with bilateral effusions small 10/10/2020: Cystic right-sided ovarian mass 5 cm 9/01 PCCM consulted for hypotension requiring pressor initiation, max requirement NE 38mg; weaned down to 341m 9/1AM. Palliative Care contacted for [place] in the setting of carcinomatosis. Patient made decision for DNR with palliation. 9/2 unable to wean pressors, not taking p.o. very much, added scheduled Phenergan with some improvement, abdominal drain placed, low-dose hydromorphone continuous with PCA added with improved symptoms s/p indwelling peirtoneal cath 9/3 pain better 9/4 weaned pressors 9/5 - Off pressors, consider beacon place tomorrow if able  Interim History / Subjective:    9/6 -currently cancer pain well controlled with opioid PCA.  She has indwelling peritoneal catheter.  She is refusing a lot of the oral tablets because of nausea.  She is able to take midodrine and in the liquid form.  She is alert and oriented.  She is still on low-dose Levophed.  Her goal is comfort with DNR.  According to the bedside nursing she is amenable to go to the floor  for hospice care if we can place not available.  Of note her presenting problem is abdominal pain which is cancer pain.  She also had admission problems of iron deficiency anemia  and acute renal failure and asymptomatic COVID.  Per GI note she also had weight loss and malnutrition at admission-consistent with failure to thrive and protein calorie malnutrition.  Admission albumin was 3  Objective:  Blood pressure 97/65, pulse (!) 104, temperature (!) 97 F (36.1 C), temperature source Axillary, resp. rate 16, height '5\' 7"'  (1.702 m), weight 87.7 kg, SpO2 97 %.    FiO2 (%):  [0 %] 0 %   Intake/Output Summary (Last 24 hours) at 10/27/2020 1022 Last data filed at 10/27/2020 1015 Gross per 24 hour  Intake 993.4 ml  Output 375 ml  Net 618.4 ml   Filed Weights   10/06/20 0000 10/08/20 1351  Weight: 87.7 kg 87.7 kg   Physical Examination: Pleasant female.  Oriented x3.  Reading a book.  Has PCA.  Edentulous.  Clear to auscultation.  Normal heart sounds.  Abdomen soft but slightly distended.  Some bruising present.  Has drainage.  No sinus no clubbing no edema.  Looks deconditioned.  ICD 1-o prob list  Present on Admission Present on Admission:  Cirrhosis of liver ([place])  (Resolved) Cirrhosis ([place])  Type 2 diabetes mellitus with hyperglycemia ([place])  Hyperlipidemia associated with type 2 diabetes mellitus ([place])  Essential hypertension  Malignant ascites  Iron deficiency anemia  AKI (acute kidney injury) ([place])  Gastritis  Primary peritoneal carcinomatosis (HCC)  N&V (nausea and vomiting)  COVID-19 virus infection  Failure to thrive in adult  Protein-calorie malnutrition, moderate (HCC)  Ovarian cancer on right ([place])  Cancer associated pain   Active Problems Principal Problem:   Primary peritoneal carcinomatosis ([place]) Active Problems:   Cirrhosis of liver (HCC)   Malignant ascites   AKI (acute kidney injury) ([place])   DNR (do not resuscitate)   Comfort measures only status   Shock  circulatory ([place])   COVID-19 virus infection   Ovarian cancer on right ([place])   Cancer associated pain   Iron deficiency anemia   Goals of care, counseling/discussion   Palliative care by specialist   N&V (nausea and vomiting)   Acute hypoxemic respiratory failure due to COVID-19 ([place])   Failure to thrive in adult   Protein-calorie malnutrition, moderate ([place])   Essential hypertension   Hyperlipidemia associated with type 2 diabetes mellitus ([place])   Type 2 diabetes mellitus with hyperglycemia (HCC)   Hyperkalemia   Hyponatremia   Gastritis   Comprehensive Problem List Patient Active Problem List   Diagnosis Date Noted   DNR (do not resuscitate) 10/22/2020    Priority: High    Class: Diagnosis of   Comfort measures only status 10/22/2020    Priority: High    Class: End Stage   Shock circulatory ([place]) 10/22/2020    Priority: High    Class: Acute   Cirrhosis of liver ([place]) 10/05/2020    Priority: High    Class: Diagnosis of   Malignant ascites 10/05/2020    Priority: High    Class: Diagnosis of   AKI (acute kidney injury) ([place]) 10/05/2020    Priority: High    Class: Acute   Primary peritoneal carcinomatosis ([place]) 10/05/2020    Priority: High    Class: Diagnosis of   COVID-19 virus infection 10/05/2020    Priority: High    Class: Acute   Ovarian cancer on right ([place]) 10/05/2020    Priority: High    Class: Diagnosis of   Cancer associated pain 10/05/2020    Priority: High    Class: Diagnosis of   Acute hypoxemic respiratory  failure due to COVID-19 [facility]) 10/22/2020    Priority: Medium    Class: Acute   Palliative care by specialist 10/21/2020    Priority: Medium   Goals of care, counseling/discussion 10/12/2020    Priority: Medium   Iron deficiency anemia 10/05/2020    Priority: Medium    Class: Present on Admission   N&V (nausea and vomiting) 10/05/2020    Priority: Medium    Class: Diagnosis of   Failure to thrive in adult 10/05/2020    Priority: Medium     Class: Present on Admission   Protein-calorie malnutrition, moderate ([place]) 10/05/2020    Priority: Medium    Class: Present on Admission   Type 2 diabetes mellitus with hyperglycemia ([place]) 09/10/2020    Priority: Low    Class: Chronic   Hyperlipidemia associated with type 2 diabetes mellitus ([place]) 10/23/2016    Priority: Low    Class: Chronic   Essential hypertension 03/28/2007    Priority: Low    Class: Diagnosis of   PSORIASIS 03/28/2007    Priority: Low    Class: Chronic   Gastritis 10/08/2020   Hyperkalemia    Hyponatremia    Dyspepsia 09/10/2020   SOB (shortness of breath) 09/10/2020   Osteoarthritis of both knees 07/08/2014   URTICARIA 01/09/2009   GASTROENTERITIS 11/03/2008     Assessment & Plan:  Acute COVID-19 admitted 10/05/20   = Did not need oxygen still 10/22/2020 Plan  - Monitor off isolation  Acute hypoxemic respiratory failure onset 10/22/2020  10/27/2020: Requiring 4 L nasal cannula.  Likely due to COVID and also PCA  Plan -monitor for pulse ox goal greater than 88% -Comfort oriented oxygen as well.   Shock, presumed septic (likely source LLL PNA) with component of chronic hypotension Admitted 8/15, COVID+ with likely LLL PNA. Chronically hypotensive with significant worsening noted 8/31PM prompting PCCM consult for ICU transfer and pressor initiation. Finished course abx for Pna.  10/27/2020-still needing low-dose pressors.  Refusing fludrocortisone because of nausea  Plan - Systolic blood pressure goal greater than 90 and MAP goal greater than 85 - Start IV hydrocortisone -Continue midodrine  Intractable nausea/vomiting: Due to carcinomatosis, severe constipation.  - Patient has consistently refused NGT and bowel regimen, though this would be an option if needed for relief  Plan  - - Phenergan scheduled (zofran, compazine ineffective), PRN compazine -Hopefully the peritoneal catheter placed will help -Symptom directed palliative comfort care  oriented management  AKI NAGMA in the setting of AKI [present on admission]  10/27/2020: Slowly improving  Plan - Replete electrolytes as indicated - Monitor I&Os - Avoid nephrotoxic agents as able - Ensure adequate renal perfusion  Peritoneal carcinomatosis with malignant ascites comfort care DNR [present on admission] = Fluid exudative with lymphocyte predominance.  Most likely right-sided ovarian cancer - Appreciate Oncology and Pallative Care assistance with management - Unfortunately, based on Onc/PMT notes, appears patient is no longer a candidate for additional chemotherapy, as she has had poor tolerance of medication and little clinical improvement. Prognosis remains very poor. PMT spoke with patient today (9/1) and confirmed wishes for DNR with eventual transition to comfort care. At this time, if the patient's goal is to get out of the hospital and enter hospice care, would continue adjunct medications as above to allow transition out of the hospital. - peritoneal drain placed 9/2   Plan  - Aim to get off pressors and transferred to the floor while waiting for beacon place  Anemia, likely due to chronic illness -  present on admission - Monitor for signs/symptoms of active bleeding - Trend H&H - Transfuse for Hgb < 7.0  Generalized back and abdominal pain due to cancer = present on admission  10/29/2020: Cancer-related pain.  Much better controlled with PCA  Plan -Dilaudid PCA 0.25 mg continuous, 0.25 mg every 30 minutes as needed, not using as needed dose, continue for now   T2DM, non-insulin dependent - SSI PRN - Holding home Jardiance, metformin given poor PO intake/vomiting  Failure to thrive -present on admission Protein calorie malnutrition -moderate present on admission  Plan  -Comfort foods and diet as tolerated.  Best Practice: (right click and "Reselect all SmartList Selections" daily)   Diet/type: full liquids , ADAT DVT prophylaxis: prophylactic heparin   GI prophylaxis: PPI Lines: Central line - indwelling port Foley:  Yes, and it is still needed Code Status:  DNR + comfort Last date of multidisciplinary goals of care discussion [9/4 met with patient and sister at bedside, discussed difficulty weaning off pressors, suggested that is unlikely she will would leave hospital and likely would die in the hospital.  Not sure if beacon place is realistic.  They want time for people to visit.Marland Kitchen]  10/27/2020 call Sister Lonia Blood [phone] Marl[employer] is the only DPOA and there are no kids] = kept ringing.  Did not leave voicemail to call back.     ATTESTATION & SIGNATURE   The patient Beth Sanchez is critically ill with multiple organ systems failure and requires high complexity decision making for assessment and support, frequent evaluation and titration of therapies, application of advanced monitoring technologies and extensive interpretation of multiple databases.   Critical Care Time devoted to patient care services described in this note is  60  Minutes. This time reflects time of care of this signee Dr Brand Males. This critical care time does not reflect procedure time, or teaching time or supervisory time of PA/NP/Med student/Med Resident etc but could involve care discussion time     Dr. Brand Males, M.D., [facility].C.P Pulmonary and Critical Care Medicine Staff Physician [facility] Pager: [phone], If no answer or between  15:00h - 7:00h: call [phone]  10/27/2020 10:22 AM     LABS    PULMONARY No results for input(s): PHART, PCO2ART, PO2ART, HCO3, TCO2, O2SAT in the last 168 hours.  Invalid input(s): PCO2, PO2  CBC Recent Labs  Lab 10/21/20 0426 10/22/20 0315  HGB 10.9* 11.3*  HCT 34.5* 34.9*  WBC 4.7 4.2  PLT 208 200    COAGULATION No results for input(s): INR in the last 168 hours.  CARDIAC  No results for input(s): TROPONINI in the last 168 hours. No  results for input(s): PROBNP in the last 168 hours.   CHEMISTRY Recent Labs  Lab 10/21/20 0426 10/22/20 0315  NA 137 137  K 4.2 4.3  CL 108 108  CO2 21* 19*  GLUCOSE 120* 123*  BUN 54* 48*  CREATININE 2.35* 2.22*  CALCIUM 8.9 8.8*   Estimated Creatinine Clearance: 28.7 mL/min (A) (by C-G formula based on SCr of 2.22 mg/dL (H)).   LIVER Recent Labs  Lab 10/21/20 0426 10/22/20 0315  AST 30 28  ALT 17 19  ALKPHOS 97 105  BILITOT 0.8 1.0  PROT 5.9* 5.5*  ALBUMIN 3.1* 2.8*     INFECTIOUS Recent Labs  Lab 10/22/20 0317 10/22/20 0726  LATICACIDVEN  --  0.9  PROCALCITON 0.50  --  ENDOCRINE CBG (last 3)  No results for input(s): GLUCAP in the last 72 hours.       IMAGING x48h  - image(s) personally visualized  -   highlighted in bold No results found.

## 2020-10-28 DIAGNOSIS — G893 Neoplasm related pain (acute) (chronic): Secondary | ICD-10-CM | POA: Diagnosis not present

## 2020-10-28 DIAGNOSIS — C482 Malignant neoplasm of peritoneum, unspecified: Secondary | ICD-10-CM | POA: Diagnosis not present

## 2020-10-28 DIAGNOSIS — Z7189 Other specified counseling: Secondary | ICD-10-CM | POA: Diagnosis not present

## 2020-10-28 DIAGNOSIS — R579 Shock, unspecified: Secondary | ICD-10-CM | POA: Diagnosis not present

## 2020-10-28 DIAGNOSIS — K567 Ileus, unspecified: Secondary | ICD-10-CM | POA: Diagnosis not present

## 2020-10-28 LAB — GLUCOSE, CAPILLARY
Glucose-Capillary: 102 mg/dL — ABNORMAL HIGH (ref 70–99)
Glucose-Capillary: 91 mg/dL (ref 70–99)

## 2020-10-28 MED ORDER — LACTULOSE 10 GM/15ML PO SOLN
10.0000 g | Freq: Every day | ORAL | Status: DC
  Filled 2020-10-28: qty 15

## 2020-10-28 NOTE — TOC Progression Note (Signed)
Transition of Care [facility]) - Progression Note   Patient Details  Name: Beth Sanchez MRN: [id number] Date of Birth: [birth year]  Transition of Care [facility]) CM/SW [place], LCSW Phone Number: 10/28/2020, 11:49 AM  Clinical Narrative: Referral to [facility] is on hold as patient's sister has expressed concerns about visitation and taking the patient off pressors. TOC to follow and will make referral to [facility] when notified to do so.  Expected Discharge Plan: [place] Barriers to Discharge: Continued Medical Work up  Expected Discharge Plan and Services Expected Discharge Plan: [place] arrangements for the past 2 months: Single Family Home  Readmission Risk Interventions No flowsheet data found.

## 2020-10-28 NOTE — Progress Notes (Signed)
Daily Progress Note   Patient Name: Beth Sanchez       Date: 10/28/2020 DOB: May 02, 1955  Age: 65 y.o. MRN#: [id number] Attending Physician: Brand Males, MD Primary Care Physician: Pcp, No Admit Date: 10/05/2020  Reason for Consultation/Follow-up: Establishing goals of care  Subjective: I saw and examined Beth Sanchez today.  I also spoke ran into and spoke with her sister, Beth Sanchez, in the hall.  Beth Sanchez reports that she does not have any specific complaints today.  Pain is controlled and she reports that she vomited once but nausea has been "OK overall."    Her sister tells me that they are "taking things day by day and we are not going to rush anything."    Length of Stay: 23  Current Medications: Scheduled Meds:   fludrocortisone  0.2 mg Oral BID   hydrocortisone sod succinate (SOLU-CORTEF) inj  100 mg Intravenous Q12H   HYDROmorphone   Intravenous Q4H   lactulose  10 g Oral Daily   lidocaine  15 mL Oral Once   mouth rinse  15 mL Mouth Rinse BID   midodrine  20 mg Oral TID WC   pantoprazole (PROTONIX) IV  40 mg Intravenous Q12H   sodium chloride flush  10-40 mL Intracatheter Q12H    Continuous Infusions:  sodium chloride     sodium chloride 10 mL/hr at 10/28/20 0534   dextrose 5 % and 0.45% NaCl 25 mL/hr at 10/28/20 0935   promethazine (PHENERGAN) injection (IM or IVPB) Stopped (10/28/20 0953)    PRN Meds: sodium chloride, acetaminophen **OR** acetaminophen, diphenhydrAMINE **OR** diphenhydrAMINE, prochlorperazine, sodium chloride flush  Physical Exam         Constitutional:      General: She is not in acute distress. HENT:     Head: Normocephalic and atraumatic.  Cardiovascular:     Rate and Rhythm: Normal rate.  Pulmonary:     Effort: Pulmonary effort is normal.   Skin:    General: Skin is warm and dry.   Vital Signs: BP 96/74 (BP Location: Left Arm)   Pulse 91   Temp 97.7 F (36.5 C) (Oral)   Resp (!) 25   Ht '5\' 7"'$  (1.702 m)   Wt 87.7 kg   SpO2 98%   BMI 30.28 kg/m  SpO2: SpO2: 98 % O2 Device: O2  Device: Room Air O2 Flow Rate: O2 Flow Rate (L/min): 2 L/min  Intake/output summary:  Intake/Output Summary (Last 24 hours) at 10/28/2020 1657 Last data filed at 10/28/2020 0534 Gross per 24 hour  Intake 755.97 ml  Output 350 ml  Net 405.97 ml    LBM: Last BM Date: 10/11/20 Baseline Weight: Weight: 87.7 kg Most recent weight: Weight: 87.7 kg       Palliative Assessment/Data:      Patient Active Problem List   Diagnosis Date Noted   DNR (do not resuscitate) 10/22/2020   Comfort measures only status 10/22/2020   Shock circulatory ([place]) 10/22/2020   Acute hypoxemic respiratory failure due to COVID-19 ([place]) 10/22/2020   Palliative care by specialist 10/21/2020   Goals of care, counseling/discussion 10/12/2020   Gastritis 10/08/2020   Hyperkalemia    Hyponatremia    Cirrhosis of liver ([place]) 10/05/2020   Malignant ascites 10/05/2020   Iron deficiency anemia 10/05/2020   AKI (acute kidney injury) ([place]) 10/05/2020   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   N&V (nausea and vomiting) 10/05/2020   COVID-19 virus infection 10/05/2020   Failure to thrive in adult 10/05/2020   Protein-calorie malnutrition, moderate ([place]) 10/05/2020   Ovarian cancer on right ([place]) 10/05/2020   Cancer associated pain 10/05/2020   Dyspepsia 09/10/2020   Type 2 diabetes mellitus with hyperglycemia ([place]) 09/10/2020   SOB (shortness of breath) 09/10/2020   Hyperlipidemia associated with type 2 diabetes mellitus ([place]) 10/23/2016   Osteoarthritis of both knees 07/08/2014   URTICARIA 01/09/2009   GASTROENTERITIS 11/03/2008   Essential hypertension 03/28/2007   PSORIASIS 03/28/2007    Palliative Care Assessment & Plan   Patient Profile: 65  y.o. female  with past medical history of HTN, DMII, HLD, and obesity  admitted on 10/05/2020 with abdominal pain, distention, low back pain, emesis, and fatigue.  On admission, patient found to have liver cirrhosis with ascites, AKI, hypotension, mild hyponatermia, and COVID-19.   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 She is being followed by Beth Sanchez. She had 1 dose of carboplatinum without improvement. Chemotherapy is on hold given patient's hypotensive state. She is unable to tolerate PO intake without N/V.  She had a PleurX catheter placed in IR with 4.5L output.  Recommendations/Plan: She is not a candidate for further chemotherapy and recommendation per oncology is consideration for [facility].  Discussed with Beth Sanchez and will hold off today on having Beth Sanchez reach out to family as her sister has expressed concerns about "rushing" things.  She had a long discussion with Beth Sanchez this AM about concerns that he outlined in his note as well.   Overall goal of comfort moving forward, but patient and her sister still not in a place to agree with plan to forgo further pressor support as she is awake and alert and not uncomfortable at this point.  Sister has requested to be updated at time of decompensation to make decision about restarting pressors.  Code Status:    Code Status Orders  (From admission, onward)           Start     Ordered   10/22/20 0718  Do not attempt resuscitation (DNR)  Continuous       Question Answer Comment  In the event of cardiac or respiratory ARREST Do not call a "code blue"   In the event of cardiac or respiratory ARREST Do not perform  Intubation, CPR, defibrillation or ACLS   In the event of cardiac or respiratory ARREST Use medication by any route, position, wound care, and other measures to relive pain and suffering. May use  oxygen, suction and manual treatment of airway obstruction as needed for comfort.      10/22/20 0717           Code Status History     Date Active Date Inactive Code Status Order ID Comments User Context   10/05/2020 1703 10/22/2020 0716 Full Code [id number]  Lequita Halt, MD ED       Prognosis: Guarded  Discharge Planning: To Be Determined- ?  [facility] plan was discussed with patient, sister, bedside care team  Thank you for allowing the Palliative Medicine Team to assist in the care of this patient.   Time In: 1500 Time Out: 1525 Total Time 25 Prolonged Time Billed No   Greater than 50%  of this time was spent counseling and coordinating care related to the above assessment and plan.   Micheline Rough, MD  Please contact Palliative Medicine Team phone at [phone] for questions and concerns.

## 2020-10-28 NOTE — Progress Notes (Signed)
[facility] Liaison note.  Contacted by Dessa Phi, RN for patient request for information on [facility] [place] hosp[facility]. Prior to making the visit, received notification from Dr. Domingo Cocking to hold off consult visit for now.  Please call [place] liaison for further assistance if family and patient request hospice services.  Thank you, Farrel Gordon, RN, [facility] Liaison [phone]

## 2020-10-28 NOTE — TOC Progression Note (Signed)
Transition of Care [facility]) - Progression Note    Patient Details  Name: Beth Sanchez MRN: [id number] Date of Birth: May 06, 1955  [facility] of [facility]) CM/SW Contact  Corianne Buccellato, Juliann Pulse, RN Phone Number: 10/28/2020, 9:09 AM  Clinical Narrative:   Per [facility] liason Latanya Presser she will stop by around 11a ish to discuss services & answer questions about [facility] hosp[facility].    Expected Discharge Plan: [place] Barriers to Discharge: Continued Medical Work up  Expected Discharge Plan and Services Expected Discharge Plan: [place] arrangements for the past 2 months: Single Family Home                                       Social Determinants of Health (SDOH) Interventions    Readmission Risk Interventions No flowsheet data found.

## 2020-10-28 NOTE — Progress Notes (Signed)
Daily Progress Note   Patient Name: Beth Sanchez       Date: 10/28/2020 DOB: 08/27/1955  Age: 65 y.o. MRN#: [id number] Attending Physician: Brand Males, MD Primary Care Physician: Pcp, No Admit Date: 10/05/2020  Reason for Consultation/Follow-up: Establishing goals of care  Subjective: I saw and examined Beth Sanchez today.  Her sister, Beth Sanchez, was at the bedside as well.  Beth Sanchez reports she is feeling better today.  She denies uncontrolled pain and feels like she is less nauseous and may even have a little bit of an appetite today.  While she and her sister both report understanding this is not likely to get better, her sister also reports they "still have hope" things will improve for her.  She is currently off pressor support and I discussed placing a limit of no more pressors moving forward.  Beth Sanchez stated that they she felt that they should be restarted if necessary as Beth Sanchez was not uncomfortable and they "still have things to do."  Length of Stay: 23  Current Medications: Scheduled Meds:   fludrocortisone  0.2 mg Oral BID   hydrocortisone sod succinate (SOLU-CORTEF) inj  100 mg Intravenous Q12H   HYDROmorphone   Intravenous Q4H   lidocaine  15 mL Oral Once   mouth rinse  15 mL Mouth Rinse BID   midodrine  20 mg Oral TID WC   pantoprazole (PROTONIX) IV  40 mg Intravenous Q12H   sodium chloride flush  10-40 mL Intracatheter Q12H    Continuous Infusions:  sodium chloride     sodium chloride 10 mL/hr at 10/28/20 0534   dextrose 5 % and 0.45% NaCl 25 mL/hr at 10/28/20 0534   norepinephrine (LEVOPHED) Adult infusion Stopped (10/27/20 1015)   promethazine (PHENERGAN) injection (IM or IVPB) Stopped (10/28/20 0516)    PRN Meds: sodium chloride, acetaminophen **OR**  acetaminophen, diphenhydrAMINE **OR** diphenhydrAMINE, prochlorperazine, sodium chloride flush  Physical Exam         Constitutional:      General: She is not in acute distress. HENT:     Head: Normocephalic and atraumatic.  Cardiovascular:     Rate and Rhythm: Normal rate.  Pulmonary:     Effort: Pulmonary effort is normal.  Skin:    General: Skin is warm and dry.   Vital Signs: BP 110/79 (BP  Location: Left Arm)   Pulse 88   Temp (!) 97.5 F (36.4 C) (Oral)   Resp (!) 21   Ht '5\' 7"'$  (1.702 m)   Wt 87.7 kg   SpO2 97%   BMI 30.28 kg/m  SpO2: SpO2: 97 % O2 Device: O2 Device: Nasal Cannula O2 Flow Rate: O2 Flow Rate (L/min): 4 L/min  Intake/output summary:  Intake/Output Summary (Last 24 hours) at 10/28/2020 [id number] Last data filed at 10/28/2020 0534 Gross per 24 hour  Intake 971.76 ml  Output 400 ml  Net 571.76 ml   LBM: Last BM Date: 10/11/20 Baseline Weight: Weight: 87.7 kg Most recent weight: Weight: 87.7 kg       Palliative Assessment/Data:      Patient Active Problem List   Diagnosis Date Noted   DNR (do not resuscitate) 10/22/2020   Comfort measures only status 10/22/2020   Shock circulatory ([place]) 10/22/2020   Acute hypoxemic respiratory failure due to COVID-19 ([place]) 10/22/2020   Palliative care by specialist 10/21/2020   Goals of care, counseling/discussion 10/12/2020   Gastritis 10/08/2020   Hyperkalemia    Hyponatremia    Cirrhosis of liver ([place]) 10/05/2020   Malignant ascites 10/05/2020   Iron deficiency anemia 10/05/2020   AKI (acute kidney injury) ([place]) 10/05/2020   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   N&V (nausea and vomiting) 10/05/2020   COVID-19 virus infection 10/05/2020   Failure to thrive in adult 10/05/2020   Protein-calorie malnutrition, moderate ([place]) 10/05/2020   Ovarian cancer on right ([place]) 10/05/2020   Cancer associated pain 10/05/2020   Dyspepsia 09/10/2020   Type 2 diabetes mellitus with hyperglycemia ([place])  09/10/2020   SOB (shortness of breath) 09/10/2020   Hyperlipidemia associated with type 2 diabetes mellitus ([place]) 10/23/2016   Osteoarthritis of both knees 07/08/2014   URTICARIA 01/09/2009   GASTROENTERITIS 11/03/2008   Essential hypertension 03/28/2007   PSORIASIS 03/28/2007    Palliative Care Assessment & Plan   Patient Profile: 65 y.o. female  with past medical history of HTN, DMII, HLD, and obesity  admitted on 10/05/2020 with abdominal pain, distention, low back pain, emesis, and fatigue.  On admission, patient found to have liver cirrhosis with ascites, AKI, hypotension, mild hyponatermia, and COVID-19.   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 She is being followed by Dr. Martha Clan. She had 1 dose of carboplatinum without improvement. Chemotherapy is on hold given patient's hypotensive state. She is unable to tolerate PO intake without N/V.  She had a PleurX catheter placed in IR with 4.5L output.  Recommendations/Plan: She is not a candidate for further chemotherapy and recommendation per oncology is consideration for [facility].   Overall goal of comfort moving forward, but patient and her sister still not in a place to discontinue pressor support if needed as she is awake and alert and not uncomfortable at this point. Will continue to follow and continue conversation based upon her clinical course.  Likely goal will be inpatient hospice, but she and sister report having "some things she needs to take care of first."  Code Status:    Code Status Orders  (From admission, onward)           Start     Ordered   10/22/20 0718  Do not attempt resuscitation (DNR)  Continuous       Question Answer Comment  In the event of cardiac or respiratory ARREST Do not call a "  code blue"   In the event of cardiac or respiratory ARREST Do not perform Intubation, CPR,  defibrillation or ACLS   In the event of cardiac or respiratory ARREST Use medication by any route, position, wound care, and other measures to relive pain and suffering. May use oxygen, suction and manual treatment of airway obstruction as needed for comfort.      10/22/20 0717           Code Status History     Date Active Date Inactive Code Status Order ID Comments User Context   10/05/2020 1703 10/22/2020 0716 Full Code [id number]  Lequita Halt, MD ED       Prognosis: Guarded  Discharge Planning: To Be Determined- ?  [facility] plan was discussed with patient, sister, bedside care team  Thank you for allowing the Palliative Medicine Team to assist in the care of this patient.   Time In: 1815 Time Out: 1845 Total Time 30 Prolonged Time Billed No      Greater than 50%  of this time was spent counseling and coordinating care related to the above assessment and plan.  Micheline Rough, MD  Please contact Palliative Medicine Team phone at [phone] for questions and concerns.

## 2020-10-28 NOTE — Progress Notes (Addendum)
NAME:  Beth Sanchez, MRN:  [id number], DOB:  [birth year], LOS: 68 ADMISSION DATE:  10/05/2020  CONSULTATION DATE: 10/22/2020 REFERRING MD:  Eliseo Squires - TRH CHIEF COMPLAINT:  Shock, Hypotension   BRIEF  65 year old woman who presented to [facility] ED 8/15 with back pain, abdominal distention/pain, nausea/vomiting and fatigue. PMHx significant for HTN, T2DM, cirrhosis (new diagnosis this admission), peritoneal carcinomatosis and psoriasis.  Patient notes progressive worsening of symptoms and deterioration for the last 6 months prior to admission.  She was diagnosed with COVID 8/15, found to have new cirrhosis with ascites and was ultimately diagnosed with primary peritoneal carcinomatosis.  She has undergone 5 paracenteses this admission. She received one dose of carboplatin last week, but additional treatment has been limited due to hypotension/inability to tolerate chemotherapy. She has back pain of undetermined duration. Per RN, she had large volume emesis on admission, which has mostly subsided. She was started on midodrine 9/1 but ultimately developed acutely worsening hypotension requiring transfer to the ICU for pressor support.  PCCM consulted for further evaluation and management of hypotension/pressor initiation.   Pertinent Medical History:  HTN, T2DM, cirrhosis (new diagosis), peritoneal carcinomatosis, psoriasis   has a past medical history of AKI (acute kidney injury) ([place]) (10/05/2020), Cancer associated pain (10/05/2020), Cirrhosis of liver ([place]) (10/05/2020), Comfort measures only status (10/22/2020), COVID-19 virus infection (10/05/2020), DNR (do not resuscitate) (10/22/2020), Failure to thrive in adult (10/05/2020), Goals of care, counseling/discussion (10/12/2020), Hyperlipidemia associated with type 2 diabetes mellitus ([place]) (10/23/2016), Hypertension, Iron deficiency anemia (10/05/2020), Malignant ascites (10/05/2020), N&V (nausea and vomiting) (10/05/2020), Ovarian cancer on right ([place]) (10/05/2020),  Palliative care by specialist (10/21/2020), Primary peritoneal carcinomatosis ([place]) (10/12/2020), Primary peritoneal carcinomatosis ([place]) (10/12/2020), Protein-calorie malnutrition, moderate ([place]) (10/05/2020), Psoriasis, PSORIASIS (03/28/2007), Shock circulatory ([place]) (10/22/2020), and Type 2 diabetes mellitus with hyperglycemia ([place]) (09/10/2020).   has a past surgical history that includes Esophagogastroduodenoscopy (egd) with propofol (N/A, 10/08/2020); biopsy (10/08/2020); IR IMAGING GUIDED PORT INSERTION (10/13/2020); and IR IMAGE GUIDED DRAINAGE PERCUT CATH  PERITONEAL RETROPERIT (10/23/2020).   Significant Hospital Events: Including procedures, antibiotic start and stop dates in addition to other pertinent events   8/15 Admitted to [facility]. COVID +.  CT abdomen lung image -normal lung fields 10/07/2020: CA125 extremely high at 7200.  Malignant ascites.  CT chest with bilateral effusions small 10/10/2020: Cystic right-sided ovarian mass 5 cm 9/01 PCCM consulted for hypotension requiring pressor initiation, max requirement NE 17mg; weaned down to 363m 9/1AM. Palliative Care contacted for [place] in the setting of carcinomatosis. Patient made decision for DNR with palliation. 9/2 unable to wean pressors, not taking p.o. very much, added scheduled Phenergan with some improvement, abdominal drain placed, low-dose hydromorphone continuous with PCA added with improved symptoms s/p indwelling peirtoneal cath 9/3 pain better 9/4 weaned pressors 9/5 - Off pressors, consider beacon place tomorrow if able 9/6 -currently cancer pain well controlled with opioid PCA.  She has indwelling peritoneal catheter.  She is refusing a lot of the oral tablets because of nausea.  She is able to take midodrine and in the liquid form.  She is alert and oriented.  She is still on low-dose Levophed.  Her goal is comfort with DNR.  According to the bedside nursing she is amenable to go to the floor for hospice care if we can place not  available. Of note her presenting problem is abdominal pain which is cancer pain.  She also had admission problems of iron deficiency anemia and acute renal failure and asymptomatic COVID.  Per  GI note she also had weight loss and malnutrition at admission-consistent with failure to thrive and protein calorie malnutrition.  Admission albumin was 3  Interim History / Subjective:   9/7 -currently on room air.  Has Dilaudid PCA.  Is off pressors.  Is on maintenance fluids.  Is on IV hydrocortisone since yesterday [was not taking fludrocortisone because of nausea].  Prior to that has been on midodrine for a few days.  Currently systolic blood pressure is 80 with a MAP of 66.  She is reading book.  She denies any complaints.  She uses Dilaudid PCA for pain.  See discussion yesterrday with sister below    Objective:  Blood pressure 110/79, pulse 88, temperature (!) 97.5 F (36.4 C), temperature source Oral, resp. rate (!) 21, height _0  (1.702 m), weight 87.7 kg, SpO2 97 %.    FiO2 (%):  [0 %] 0 %   Intake/Output Summary (Last 24 hours) at 10/28/2020 0853 Last data filed at 10/28/2020 0534 Gross per 24 hour  Intake 971.76 ml  Output 400 ml  Net 571.76 ml   Filed Weights   10/06/20 0000 10/08/20 1351  Weight: 87.7 kg 87.7 kg   Physical Examination: No change in exam since yesterday.  She is alert and conversant.  Oriented x3.  She has PCA.  She is edentulous.  Clear to auscultation bilaterally normal heart sounds abdomen soft.  No clubbing no cyanosis.  She looks physically somewhat deconditioned but stable since yesterday.  She has slightly distended abdomen with some baseline discoloration purpuric/ecchymosis.  She has peritoneal drain.  ICD 1-o prob list  Present on Admission Present on Admission:  Cirrhosis of liver ([place])  (Resolved) Cirrhosis ([place])  Type 2 diabetes mellitus with hyperglycemia ([place])  Hyperlipidemia associated with type 2 diabetes mellitus ([place])  Essential  hypertension  Malignant ascites  Iron deficiency anemia  AKI (acute kidney injury) ([place])  Gastritis  Primary peritoneal carcinomatosis (HCC)  N&V (nausea and vomiting)  COVID-19 virus infection  Failure to thrive in adult  Protein-calorie malnutrition, moderate (HCC)  Ovarian cancer on right ([place])  Cancer associated pain   Active Problems Principal Problem:   Primary peritoneal carcinomatosis ([place]) Active Problems:   Cirrhosis of liver (HCC)   Malignant ascites   AKI (acute kidney injury) ([place])   DNR (do not resuscitate)   Comfort measures only status   Shock circulatory ([place])   COVID-19 virus infection   Ovarian cancer on right ([place])   Cancer associated pain   Iron deficiency anemia   Goals of care, counseling/discussion   Palliative care by specialist   N&V (nausea and vomiting)   Acute hypoxemic respiratory failure due to COVID-19 ([place])   Failure to thrive in adult   Protein-calorie malnutrition, moderate ([place])   Essential hypertension   Hyperlipidemia associated with type 2 diabetes mellitus ([place])   Type 2 diabetes mellitus with hyperglycemia (HCC)   Hyperkalemia   Hyponatremia   Gastritis   Comprehensive Problem List Patient Active Problem List   Diagnosis Date Noted   DNR (do not resuscitate) 10/22/2020    Priority: High    Class: Diagnosis of   Comfort measures only status 10/22/2020    Priority: High    Class: End Stage   Shock circulatory ([place]) 10/22/2020    Priority: High    Class: Acute   Cirrhosis of liver ([place]) 10/05/2020    Priority: High    Class: Diagnosis of   Malignant ascites 10/05/2020    Priority: High  Class: Diagnosis of   AKI (acute kidney injury) ([place]) 10/05/2020    Priority: High    Class: Acute   Primary peritoneal carcinomatosis ([place]) 10/05/2020    Priority: High    Class: Diagnosis of   COVID-19 virus infection 10/05/2020    Priority: High    Class: Acute   Ovarian cancer on right ([place]) 10/05/2020    Priority: High     Class: Diagnosis of   Cancer associated pain 10/05/2020    Priority: High    Class: Diagnosis of   Acute hypoxemic respiratory failure due to COVID-19 ([place]) 10/22/2020    Priority: Medium    Class: Acute   Palliative care by specialist 10/21/2020    Priority: Medium   Goals of care, counseling/discussion 10/12/2020    Priority: Medium   Iron deficiency anemia 10/05/2020    Priority: Medium    Class: Present on Admission   N&V (nausea and vomiting) 10/05/2020    Priority: Medium    Class: Diagnosis of   Failure to thrive in adult 10/05/2020    Priority: Medium    Class: Present on Admission   Protein-calorie malnutrition, moderate ([place]) 10/05/2020    Priority: Medium    Class: Present on Admission   Type 2 diabetes mellitus with hyperglycemia ([place]) 09/10/2020    Priority: Low    Class: Chronic   Hyperlipidemia associated with type 2 diabetes mellitus ([place]) 10/23/2016    Priority: Low    Class: Chronic   Essential hypertension 03/28/2007    Priority: Low    Class: Diagnosis of   PSORIASIS 03/28/2007    Priority: Low    Class: Chronic   Gastritis 10/08/2020   Hyperkalemia    Hyponatremia    Dyspepsia 09/10/2020   SOB (shortness of breath) 09/10/2020   Osteoarthritis of both knees 07/08/2014   URTICARIA 01/09/2009   GASTROENTERITIS 11/03/2008     Assessment & Plan:  Peritoneal carcinomatosis with malignant ascites (CA 7200 8/17, comfort care DNR [present on admission] . Primary problem this admit= Fluid exudative with lymphocyte predominance.  Most likely right-sided ovarian cancer - Appreciate Oncology and Pallative Care assistance with management - Unfortunately, based on Onc/PMT notes, appears patient is no longer a candidate for additional chemotherapy, as she has had poor tolerance of medication and little clinical improvement. Prognosis remains very poor.  - peritoneal drain placed 9/2   Plan  -No chemo inidated - symptom based care -   Cirrhosis of Liver  - new dx 10/05/20 at admit. ETiology not known but  HepPanel . HIV 8/15 neg, CEA 2.2 and CA19-9 - 7 and normal  Plan  - symptom based Rx - will add lactulose 10/28/20   Acute COVID-19 admitted 10/05/20 . Incidental-  Did not need oxygen still 10/22/2020   Plan  - Monitor off isolation (> 3 weeks as of 10/27/20)  Acute hypoxemic respiratory failure onset 10/22/2020  10/28/2020:  now down to [facility]   from 4L [place] yesterday. Likely due to COVID and also PCA  Plan -monitor for pulse ox goal greater than 88% -Comfort oriented oxygen as well.   Shock, presumed septic (likely source LLL PNA) with component of chronic hypotension Admitted 8/15, COVID+ with likely LLL PNA. Chronically hypotensive with significant worsening noted 8/31PM prompting PCCM consult for ICU transfer and pressor initiation. Finished course abx for Pna.  10/28/2020- off pressors after replacing fludrocortisone (nausea) with IV hydorocrt  Plan - Systolic blood pressure goal  > 80 with alertness or MAP > 60  with alertness - Continue IV hydrocortisone -Continue midodrine - Sister DPOA - still needing pressors if patient decompensates  Intractable nausea/vomiting: Due to carcinomatosis, severe constipation. - - Patient has consistently refused NGT and bowel regimen, though this would be an option if needed for relief  9/7 - controlled with palliative drugs and s/p peritoneal cath  Plan  - - Phenergan scheduled (zofran, compazine ineffective), PRN compazine -cotninue peritoneal catheter placed  -Symptom directed palliative comfort care oriented management  AKI NAGMA in the setting of AKI [present on admission] - has foley  10/28/2020: Making urine. Foley left in place for palliative reasons. Last labs 10/22/20  Plan - monitor cliinically  - labs as directed and per goals of care with palliative team    Anemia, likely due to chronic illness - present on admission  10/28/2020 - last lab 9/1/222. No signs/symptoms of active  bleeding  Plan  - symptom based Rx and PRBC if hgb < 7 if c/w goals of are   Generalized back and abdominal pain due to cancer = present on admission  10/28/2020: Cancer-related pain.  Much better controlled with PCA  Plan -Dilaudid PCA 0.25 mg continuous, 0.25 mg every 30 minutes as needed, not using as needed dose, continue for now   T2DM, non-insulin dependent - SSI PRN - Holding home Jardiance, metformin given poor PO intake/vomiting  Failure to thrive -present on admission Protein calorie malnutrition -moderate present on admission  Plan  -Comfort foods and diet as tolerated.  Best Practice: (right click and "Reselect all SmartList Selections" daily)   Diet/type: full liquids , ADAT - D5half normal at [facility] DVT prophylaxis: prophylactic heparin  GI prophylaxis: PPI Lines: Central line - indwelling port Foley:  Yes, and it is still needed - comfort Code Status:  DNR + comfort Last date of multidisciplinary goals of care discussion [9/4 met with patient and sister at bedside, discussed difficulty weaning off pressors, suggested that is unlikely she will would leave hospital and likely would die in the hospital.  Not sure if beacon place is realistic.  They want time for people to visit.Marland Kitchen]  10/22/20: PMT spoke with patient today (9/1) and confirmed wishes for DNR with eventual transition to comfort care. At this time, if the patient's goal is to get out of the hospital and enter hospice care, would continue adjunct medications as above to allow transition out of the hospital.  10/27/2020  Conversation yesterday with the sister the DPOA [name]: [name] was aware that the prognosis is terminal and is of the understanding that patient could pass away anytime in few days to few weeks.  However she is relieved that the patient is conversant, reading a book and wishes to see some distant family family through [place] and is enjoying this quality of life.  She believes the pressors keeping the  blood pressure up was the reason this was possible.  She is also of the understanding that if the pressors are discontinued patient will die in short order in the matter of minutes or hours.  She does not want this.  Discussed in detail with nursing and interdisciplinary fashion: Explained again that the prognosis is terminal.  Explained that course of action was to consider IV hydrocortisone [patient was not taking fludrocortisone] and medically and naturally improve the blood pressure to the point that patient is able to come off pressors.  Explained that the rationale to come off pressors was not iatrogenic to hasten death but more as a natural means to  allow her to come off pressors so she can be transferred to an inpatient floor or out of hospital unit so she can enjoy the remainder of her natural life in a comfortable fashion.  Also explained that typical mode of death is cardiac arrest and prior to that hypotension is very likely to precede.  [name] understood this but was also saying that patient would need to be started on pressors if she was on the inpatient floor and she got hypotensive.  Yet she also understood that patient would become hypotensive before her death.  She was relieved that the goal was natural resolution of the blood pressure in the ICU to help facilitate transfer to go to another unit.  She is accepting of this.  She also understood that the prognosis was terminal.  10/28/20: spoke to [employer] [phone] over phone. Sister is very concerned that visitors are being restricted. She feels visitors will be helpful and is helpful for patient. She feels [place] is preventing visitation including ICU. She is very upset about this. She want total 5 visitors per day and sometimes all 5 at the same time. She is wondering why patient needs to be moved out. Explained patient is not on vent and not on pressors    - > She is upset that few days ago ccm team wanted to remove  pressors and was suggesting she die peacefully She said that was not acceptable. She says she is surprised that patient is now living past her life expectancy that she was told ("2 weeks") . She saisd she was also told that when patient iwas in ICU sh would died "quickly" "4 days". And this has not happened because she felt pressors helped. So she is very upset about this  -> discussed again if pressors are indicated if BP drops again -> I explained to her that if BP drops again it would be a sign that she is in terminal decline and is in process of active dying and recommendation would be not to restart pressors again. She reflected about this. She was emotional. She said: if the BP drops and is taking turn for worse (esp if mental status also declines) -> she would allow patient to pass naturally without pressors and allow natural death and ideally be able to say physically goodbye. But if she feels patient is having a quality of life then she would want pressors. She is upset that this younger patient to her might pass away sooner than her. She is not wanting ccm team or MD team to make arbitrary decisions. She really wants to be immediately updated if patient takes turn for worse so she can potentially say good bye and take decision. She is sure she will not want to prolong the inevitable death.   Resolved that ok to go to floor Liberalize visitation Any decomp sister to be updated  immediately  - sister will make on spot decision about pressor restart or not Wants Dr Marin Olp to update on prognosis Hold off beacon place assessment through 10/29/20   [place]   The patient Madeleine Fenn is critically ill with multiple organ systems failure and requires high complexity decision making for assessment and support, frequent evaluation and titration of therapies, application of advanced monitoring technologies and extensive interpretation of multiple databases.    Critical Care Time devoted to patient care services described in this note is  45  Minutes. This  time reflects time of care of this signee Dr Brand Males. This critical care time does not reflect procedure time, or teaching time or supervisory time of PA/NP/Med student/Med Resident etc but could involve care discussion time     Dr. Brand Males, M.D., [facility].C.P Pulmonary and Critical Care Medicine Staff Physician [facility] Pager: [phone], If no answer or between  15:00h - 7:00h: call [phone]  10/28/2020 10:09 AM    LABS    PULMONARY No results for input(s): PHART, PCO2ART, PO2ART, HCO3, TCO2, O2SAT in the last 168 hours.  Invalid input(s): PCO2, PO2  CBC Recent Labs  Lab 10/22/20 0315  HGB 11.3*  HCT 34.9*  WBC 4.2  PLT 200    COAGULATION No results for input(s): INR in the last 168 hours.  CARDIAC  No results for input(s): TROPONINI in the last 168 hours. No results for input(s): PROBNP in the last 168 hours.   CHEMISTRY Recent Labs  Lab 10/22/20 0315  NA 137  K 4.3  CL 108  CO2 19*  GLUCOSE 123*  BUN 48*  CREATININE 2.22*  CALCIUM 8.8*   Estimated Creatinine Clearance: 28.7 mL/min (A) (by C-G formula based on SCr of 2.22 mg/dL (H)).   LIVER Recent Labs  Lab 10/22/20 0315  AST 28  ALT 19  ALKPHOS 105  BILITOT 1.0  PROT 5.5*  ALBUMIN 2.8*     INFECTIOUS Recent Labs  Lab 10/22/20 0317 10/22/20 0726  LATICACIDVEN  --  0.9  PROCALCITON 0.50  --      ENDOCRINE CBG (last 3)  No results for input(s): GLUCAP in the last 72 hours.       IMAGING x48h  - image(s) personally visualized  -   highlighted in bold No results found.

## 2020-10-29 DIAGNOSIS — N179 Acute kidney failure, unspecified: Secondary | ICD-10-CM | POA: Diagnosis not present

## 2020-10-29 DIAGNOSIS — G893 Neoplasm related pain (acute) (chronic): Secondary | ICD-10-CM | POA: Diagnosis not present

## 2020-10-29 DIAGNOSIS — Z7189 Other specified counseling: Secondary | ICD-10-CM | POA: Diagnosis not present

## 2020-10-29 DIAGNOSIS — C482 Malignant neoplasm of peritoneum, unspecified: Secondary | ICD-10-CM | POA: Diagnosis not present

## 2020-10-29 LAB — GLUCOSE, CAPILLARY
Glucose-Capillary: 86 mg/dL (ref 70–99)
Glucose-Capillary: 81 mg/dL (ref 70–99)
Glucose-Capillary: 75 mg/dL (ref 70–99)
Glucose-Capillary: 71 mg/dL (ref 70–99)

## 2020-10-29 MED ORDER — FENTANYL 12 MCG/HR TD PT72
1.0000 | MEDICATED_PATCH | TRANSDERMAL | Status: DC
  Administered 2020-10-29 – 2020-11-07 (×4): 1 via TRANSDERMAL
  Filled 2020-10-29 (×5): qty 1

## 2020-10-29 NOTE — Progress Notes (Signed)
Surprisingly, Beth Sanchez is stabilizing.  She is now off Levophed.  Her blood pressure is still on the low side.  When I was in with her this morning, she vomited quite a bit.  It certainly was fetid in nature.  She says she ate a little bit yesterday.  I suspect this probably is why she did have vomiting.  She does have the peritoneal catheter in place.  Her abdomen does not appear to be all that swollen.  She is not hurting.  I think she is still on the PCA.  I do not think it would be a bad idea to put her on a fentanyl patch.  Maybe we can then get her off the PCA.  There is no bleeding.  She keeps asked me about any further chemotherapy.  I talked her about this.  She just is not a candidate for any chemotherapy.  Again she not able to eat.  Her abdomen is silent.  Hopefully, she will be able to get out of the ICU today and be moved upstairs.  I am just happy that she appears to be somewhat comfortable.  Her vital signs show temperature 97.6.  Pulse 92.  Blood pressure 113/75.  Her lungs sound pretty clear.  She has good air movement bilaterally.  Cardiac exam regular rate and rhythm.  Abdomen is soft.  There is no as not as much distention.  The catheter is in the right side of the abdomen.  I do not hear any bowel sounds.  Overall, I must admit that I am surprised that Beth Sanchez is still with this.  We still are focused on her comfort.  Again, hopefully she will be moved upstairs.  I did leave a long message for her sister yesterday.  I really do not think anything changes with regard to what we have to with her.  Lattie Haw, MD  Romans 8:31

## 2020-10-29 NOTE — Progress Notes (Signed)
PROGRESS NOTE    Beth Sanchez  [id number] DOB: February 05, 1956 DOA: 10/05/2020 PCP: Pcp, No    Chief Complaint  Patient presents with   Abdominal Pain    Vomiting, diarrhea    Brief Narrative:   65 year old woman with prior history of hypertension, type 2 diabetes, cirrhosis with peritoneal carcinomatosis presents to ED with back pain and abdominal distention associated with nausea and vomiting. She was diagnosed with COVID 40 in August at which time she was found to have new cirrhosis, ascites and ultimately diagnosed with, peritoneal carcinomatosis.  She underwent multiple paracentesis, and 1 dose of chemotherapy.  Unfortunately patient could not tolerate further chemotherapy and was admitted for the above, developed hypotension requiring transfer to ICU for pressor support.  After further discussions with PCCM and oncology, patient and her sister transition to DNR and comfort measures at this time. TOC on board and is trying to transfer [facility] to [facility], if the family is agreeable. Assessment & Plan:   Principal Problem:   Primary peritoneal carcinomatosis ([place]) Active Problems:   Essential hypertension   Hyperlipidemia associated with type 2 diabetes mellitus ([place])   Type 2 diabetes mellitus with hyperglycemia (HCC)   Cirrhosis of liver (HCC)   Malignant ascites   Iron deficiency anemia   AKI (acute kidney injury) ([place])   Hyperkalemia   Hyponatremia   Gastritis   Goals of care, counseling/discussion   Palliative care by specialist   N&V (nausea and vomiting)   DNR (do not resuscitate)   Comfort measures only status   Shock circulatory ([place])   COVID-19 virus infection   Acute hypoxemic respiratory failure due to COVID-19 ([place])   Failure to thrive in adult   Protein-calorie malnutrition, moderate (HCC)   Ovarian cancer on right ([place])   Cancer associated pain   Hypovolemic shock presumed to be septic likely source from left lower lobe pneumonia with a component of  chronic hypotension Required pressors briefly in ICU, currently off pressors with borderline blood pressures. Completed the course of antibiotics for the pneumonia   Intractable nausea and vomiting probably secondary to carcinomatosis As needed's Phenergan  AKI in the setting of hypovolemic shock Foley catheter left in place for palliative reasons. Would not recommend further blood work at this time as it would not change the plan/treatment   Acute hypoxic respiratory failure on 10/22/2020 Probably secondary to combination of COVID and left lower lobe pneumonia Nasal cannula oxygen to keep sats greater than 88% Completed a course of antibiotics    Liver cirrhosis diagnosed last month Symptom management    Peritoneal carcinomatosis with malignant ascites ?  Right-sided ovarian cancer Oncology and palliative on board Not a candidate for any kind of chemotherapy at this time Prognosis remains very poor She has a peritoneal drain placed on 10/23/2020 Plan for comfort at this time TOC on board for [facility] She was started on fentanyl patch for pain, plan to wean her off the Dilaudid PCA and increase the fentanyl patch dose as required.    Anemia of chronic illness No signs of active bleeding at this time.   Type 2 diabetes Non-insulin-dependent holding all oral medications due to poor intake   Failure to thrive present on admission probably secondary to metastatic cancer.   Moderate protein calorie malnutrition present on admission    DVT prophylaxis: Comfort care  code Status: (DNR Family Communication: Discussed with the sister over the phone Disposition:   Status is: Inpatient  Remains inpatient appropriate because:Unsafe d/c plan  Dispo: The patient is from: Home              Anticipated d/c is to:  Residential hospice versus home hospice              Patient currently is medically stable to d/c.   Difficult to place patient No       Consultants:   Oncology PCCM  Procedures: (None)  Antimicrobials: none   Subjective:   Objective: Vitals:   10/29/20 0739 10/29/20 0830 10/29/20 0900 10/29/20 1000  BP:  (!) '88/61 98/73 94/65 '$  Pulse:  73 92 89  Resp: '18 12 20 15  '$ Temp: 97.8 F (36.6 C)     TempSrc: Oral     SpO2: 94% 96% 93% 97%  Weight:      Height:        Intake/Output Summary (Last 24 hours) at 10/29/2020 1113 Last data filed at 10/29/2020 1000 Gross per 24 hour  Intake 1482.31 ml  Output 850 ml  Net 632.31 ml   Filed Weights   10/06/20 0000 10/08/20 1351 10/29/20 0500  Weight: 87.7 kg 87.7 kg 81.2 kg    Examination:  General exam: Elderly woman, ill-appearing, appears to be in mild distress from abdominal pain Respiratory system: Diminished air entry at bases on 2 L of nasal cannula oxygen Cardiovascular system: S1 & S2 heard, RRR. No JVD, No pedal edema. Gastrointestinal system: Abdomen is soft, distended and tender, bowel sounds are minimal Central nervous system: Alert and able to answer simple questions and following commands Extremities: Edema present Skin: No r rashes seen Psychiatry: Anxious, appears deconditioned    Data Reviewed: I have personally reviewed following labs and imaging studies  CBC: No results for input(s): WBC, NEUTROABS, HGB, HCT, MCV, PLT in the last 168 hours.  Basic Metabolic Panel: No results for input(s): NA, K, CL, CO2, GLUCOSE, BUN, CREATININE, CALCIUM, MG, PHOS in the last 168 hours.  GFR: Estimated Creatinine Clearance: 27.7 mL/min (A) (by C-G formula based on SCr of 2.22 mg/dL (H)).  Liver Function Tests: No results for input(s): AST, ALT, ALKPHOS, BILITOT, PROT, ALBUMIN in the last 168 hours.  CBG: Recent Labs  Lab 10/28/20 2029 10/28/20 2350 10/29/20 0508 10/29/20 0834  GLUCAP 102* 91 86 81     Recent Results (from the past 240 hour(s))  Culture, blood (routine x 2)     Status: None   Collection Time: 10/22/20  4:34 AM   Specimen: BLOOD  Result  Value Ref Range Status   Specimen Description   Final    BLOOD BLOOD LEFT HAND Performed at [facility] [address] [place], [place] [address]    Special Requests   Final    BOTTLES DRAWN AEROBIC AND ANAEROBIC Blood Culture adequate volume Performed at [facility] [address], [place], [place] [address]    Culture   Final    NO GROWTH 5 DAYS Performed at [facility], [place] [address] [place], [place] [address]    Report Status 10/27/2020 FINAL  Final  Culture, blood (routine x 2)     Status: None   Collection Time: 10/22/20  4:42 AM   Specimen: Right Antecubital; Blood  Result Value Ref Range Status   Specimen Description   Final    RIGHT ANTECUBITAL Performed at [facility] [address], [place],  [address]    Special Requests   Final    BOTTLES DRAWN AEROBIC ONLY Blood Culture  results may not be optimal due to an inadequate volume of blood received in culture bottles Performed at [facility], [place] [address] [place], [place] [address]    Culture   Final    NO GROWTH 5 DAYS Performed at [facility], [place] [address] [place], [place] [address]    Report Status 10/27/2020 FINAL  Final  MRSA Next Gen by PCR, Nasal     Status: None   Collection Time: 10/22/20 11:33 AM   Specimen: Nasal Mucosa; Nasal Swab  Result Value Ref Range Status   MRSA by PCR Next Gen NOT DETECTED NOT DETECTED Final    Comment: (NOTE) The GeneXpert MRSA Assay (FDA approved for NASAL specimens only), is one component of a comprehensive MRSA colonization surveillance program. It is not intended to diagnose MRSA infection nor to guide or monitor treatment for MRSA infections. Test performance is not FDA approved in patients less than 80 years old. Performed at [facility], [place] [address] [place], [place] [address]          Radiology Studies: No results  found.      Scheduled Meds:  fentaNYL  1 patch Transdermal Q72H   fludrocortisone  0.2 mg Oral BID   HYDROmorphone   Intravenous Q4H   lidocaine  15 mL Oral Once   mouth rinse  15 mL Mouth Rinse BID   midodrine  20 mg Oral TID WC   pantoprazole (PROTONIX) IV  40 mg Intravenous Q12H   sodium chloride flush  10-40 mL Intracatheter Q12H   Continuous Infusions:  sodium chloride     sodium chloride Stopped (10/29/20 0950)   promethazine (PHENERGAN) injection (IM or IVPB) 200 mL/hr at 10/29/20 1000     LOS: 24 days        Hosie Poisson, MD [facility]   To contact the attending provider between 7A-7P or the covering provider during after hours 7P-7A, please log into the web site [email or web address] and access using universal [place] password for that web site. If you do not have the password, please call the hospital operator.  10/29/2020, 11:13 AM

## 2020-10-29 NOTE — Progress Notes (Signed)
Report given to RN 3e04. Patient with no complaints at the current time. Will transfer via bed.

## 2020-10-30 ENCOUNTER — Inpatient Hospital Stay (HOSPITAL_COMMUNITY): Payer: Medicare Other

## 2020-10-30 DIAGNOSIS — G893 Neoplasm related pain (acute) (chronic): Secondary | ICD-10-CM | POA: Diagnosis not present

## 2020-10-30 DIAGNOSIS — Z7189 Other specified counseling: Secondary | ICD-10-CM | POA: Diagnosis not present

## 2020-10-30 DIAGNOSIS — N179 Acute kidney failure, unspecified: Secondary | ICD-10-CM | POA: Diagnosis not present

## 2020-10-30 DIAGNOSIS — C482 Malignant neoplasm of peritoneum, unspecified: Secondary | ICD-10-CM | POA: Diagnosis not present

## 2020-10-30 LAB — CBC WITH DIFFERENTIAL/PLATELET
Band Neutrophils: 1 %
Basophils Relative: 0 %
Blasts: NONE SEEN %
Eosinophils Relative: 0 %
HCT: 33.3 % — ABNORMAL LOW (ref 36.0–46.0)
Hemoglobin: 10.6 g/dL — ABNORMAL LOW (ref 12.0–15.0)
Lymphocytes Relative: 8 %
MCH: 26.6 pg (ref 26.0–34.0)
MCHC: 31.8 g/dL (ref 30.0–36.0)
MCV: 83.5 fL (ref 80.0–100.0)
Metamyelocytes Relative: NONE SEEN %
Monocytes Relative: 4 %
Myelocytes: NONE SEEN %
Neutrophils Relative %: 86 %
Platelets: 108 10*3/uL — ABNORMAL LOW (ref 150–400)
Promyelocytes Relative: NONE SEEN %
RBC Morphology: NORMAL
RBC: 3.99 MIL/uL (ref 3.87–5.11)
RDW: 17.3 % — ABNORMAL HIGH (ref 11.5–15.5)
WBC Morphology: NORMAL
WBC: 1.3 10*3/uL — CL (ref 4.0–10.5)
nRBC: 0 % (ref 0.0–0.2)
nRBC: NONE SEEN /100 WBC

## 2020-10-30 LAB — COMPREHENSIVE METABOLIC PANEL
ALT: 12 U/L (ref 0–44)
AST: 17 U/L (ref 15–41)
Albumin: 2.3 g/dL — ABNORMAL LOW (ref 3.5–5.0)
Alkaline Phosphatase: 140 U/L — ABNORMAL HIGH (ref 38–126)
Anion gap: 9 (ref 5–15)
BUN: 33 mg/dL — ABNORMAL HIGH (ref 8–23)
CO2: 17 mmol/L — ABNORMAL LOW (ref 22–32)
Calcium: 8.3 mg/dL — ABNORMAL LOW (ref 8.9–10.3)
Chloride: 109 mmol/L (ref 98–111)
Creatinine, Ser: 1.1 mg/dL — ABNORMAL HIGH (ref 0.44–1.00)
GFR, Estimated: 56 mL/min — ABNORMAL LOW (ref >60)
Glucose, Bld: 65 mg/dL — ABNORMAL LOW (ref 70–99)
Potassium: 4.5 mmol/L (ref 3.5–5.1)
Sodium: 135 mmol/L (ref 135–145)
Total Bilirubin: 1 mg/dL (ref 0.3–1.2)
Total Protein: 5.6 g/dL — ABNORMAL LOW (ref 6.5–8.1)

## 2020-10-30 LAB — PREALBUMIN: Prealbumin: 7.5 mg/dL — ABNORMAL LOW (ref 18–38)

## 2020-10-30 MED ORDER — HYDROMORPHONE HCL 1 MG/ML IJ SOLN
1.0000 mg | INTRAMUSCULAR | Status: DC | PRN
  Administered 2020-10-30 – 2020-11-09 (×33): 1 mg via INTRAVENOUS
  Filled 2020-10-30 (×34): qty 1

## 2020-10-30 MED ORDER — PANTOPRAZOLE SODIUM 40 MG PO TBEC
40.0000 mg | DELAYED_RELEASE_TABLET | Freq: Every day | ORAL | Status: DC
  Administered 2020-10-30 – 2020-10-31 (×2): 40 mg via ORAL
  Filled 2020-10-30 (×3): qty 1

## 2020-10-30 NOTE — Progress Notes (Signed)
Notified Dr. Karleen Hampshire via web links page of critical value of WBC 1.3.

## 2020-10-30 NOTE — Progress Notes (Signed)
Daily Progress Note   Patient Name: Beth Sanchez       Date: 10/30/2020 DOB: 1955-09-06  Age: 65 y.o. MRN#: [id number] Attending Physician: Hosie Poisson, MD Primary Care Physician: Pcp, No Admit Date: 10/05/2020  Reason for Consultation/Follow-up: Establishing goals of care  Subjective: I saw and examined Beth Sanchez today.  She denies specific complaints today.  We talked about the fear that she has about how her sister is going to do when she dies.  She has been "sharing meals" with her sister to try to encourage her sister to take care of herself.  Beth Sanchez reports that she has had 2 fries and a bite of soup today.  She expressed understanding that she continues to grow weaker and she is going to decompensate at some point.  Length of Stay: 25  Current Medications: Scheduled Meds:   fentaNYL  1 patch Transdermal Q72H   fludrocortisone  0.2 mg Oral BID   HYDROmorphone   Intravenous Q4H   lidocaine  15 mL Oral Once   mouth rinse  15 mL Mouth Rinse BID   midodrine  20 mg Oral TID WC   pantoprazole (PROTONIX) IV  40 mg Intravenous Q12H   sodium chloride flush  10-40 mL Intracatheter Q12H    Continuous Infusions:  sodium chloride     sodium chloride Stopped (10/29/20 0950)   promethazine (PHENERGAN) injection (IM or IVPB) 12.5 mg (10/30/20 0410)    PRN Meds: sodium chloride, acetaminophen **OR** acetaminophen, diphenhydrAMINE **OR** diphenhydrAMINE, prochlorperazine, sodium chloride flush  Physical Exam         Constitutional:      General: She is not in acute distress. HENT:     Head: Normocephalic and atraumatic.  Cardiovascular:     Rate and Rhythm: Normal rate.  Pulmonary:     Effort: Pulmonary effort is normal.  Skin:    General: Skin is warm and dry.   Vital  Signs: BP (!) 87/62 (BP Location: Left Arm)   Pulse 85   Temp 98 F (36.7 C) (Oral)   Resp 16   Ht '5\' 7"'$  (1.702 m)   Wt 81.2 kg   SpO2 100%   BMI 28.04 kg/m  SpO2: SpO2: 100 % O2 Device: O2 Device: Room Air O2 Flow Rate: O2 Flow Rate (L/min): 0 L/min  Intake/output  summary:  Intake/Output Summary (Last 24 hours) at 10/30/2020 0821 Last data filed at 10/30/2020 0600 Gross per 24 hour  Intake 1694.43 ml  Output 2350 ml  Net -655.57 ml    LBM: Last BM Date: 10/11/20 Baseline Weight: Weight: 87.7 kg Most recent weight: Weight: 81.2 kg       Palliative Assessment/Data:      Patient Active Problem List   Diagnosis Date Noted   DNR (do not resuscitate) 10/22/2020   Comfort measures only status 10/22/2020   Shock circulatory ([place]) 10/22/2020   Acute hypoxemic respiratory failure due to COVID-19 ([place]) 10/22/2020   Palliative care by specialist 10/21/2020   Goals of care, counseling/discussion 10/12/2020   Gastritis 10/08/2020   Hyperkalemia    Hyponatremia    Cirrhosis of liver ([place]) 10/05/2020   Malignant ascites 10/05/2020   Iron deficiency anemia 10/05/2020   AKI (acute kidney injury) ([place]) 10/05/2020   Primary peritoneal carcinomatosis ([place]) 10/05/2020   N&V (nausea and vomiting) 10/05/2020   COVID-19 virus infection 10/05/2020   Failure to thrive in adult 10/05/2020   Protein-calorie malnutrition, moderate ([place]) 10/05/2020   Ovarian cancer on right ([place]) 10/05/2020   Cancer associated pain 10/05/2020   Dyspepsia 09/10/2020   Type 2 diabetes mellitus with hyperglycemia ([place]) 09/10/2020   SOB (shortness of breath) 09/10/2020   Hyperlipidemia associated with type 2 diabetes mellitus ([place]) 10/23/2016   Osteoarthritis of both knees 07/08/2014   URTICARIA 01/09/2009   GASTROENTERITIS 11/03/2008   Essential hypertension 03/28/2007   PSORIASIS 03/28/2007    Palliative Care Assessment & Plan   Patient Profile: 65 y.o. female  with past medical history of HTN,  DMII, HLD, and obesity  admitted on 10/05/2020 with abdominal pain, distention, low back pain, emesis, and fatigue.  On admission, patient found to have liver cirrhosis with ascites, AKI, hypotension, mild hyponatermia, and COVID-19.   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 She is being followed by Dr. Martha Clan. She had 1 dose of carboplatinum without improvement. Chemotherapy is on hold given patient's hypotensive state. She is unable to tolerate PO intake without N/V.  She had a PleurX catheter placed in IR with 4.5L output.  Recommendations/Plan: She is not a candidate for further chemotherapy and recommendation per oncology is consideration for [facility].  Family concerned that this is "rushing things."  As she is awake, alert, and not in pain, sister is clear that she wants Beth Sanchez to continue current care at this point. Overall goal of comfort moving forward, but patient and her sister still not in a place to agree with plan to forgo further pressor support as she is awake and alert and not uncomfortable at this point.  Sister has requested to be updated at time of decompensation to make decision about restarting pressors.  Code Status:    Code Status Orders  (From admission, onward)           Start     Ordered   10/22/20 0718  Do not attempt resuscitation (DNR)  Continuous       Question Answer Comment  In the event of cardiac or respiratory ARREST Do not call a "code blue"   In the event of cardiac or respiratory ARREST Do not perform Intubation, CPR, defibrillation or ACLS   In the event of cardiac or respiratory ARREST Use medication by any route, position, wound care, and other measures to relive pain and suffering.  May use oxygen, suction and manual treatment of airway obstruction as needed for comfort.      10/22/20 0717           Code Status History      Date Active Date Inactive Code Status Order ID Comments User Context   10/05/2020 1703 10/22/2020 0716 Full Code [id number]  Lequita Halt, MD ED       Prognosis: Guarded  Discharge Planning: To Be Determined- ?  [facility] plan was discussed with patient, sister, bedside care team  Thank you for allowing the Palliative Medicine Team to assist in the care of this patient.       Total Time 20 Prolonged Time Billed No  Greater than 50%  of this time was spent counseling and coordinating care related to the above assessment and plan.    Micheline Rough, MD  Please contact Palliative Medicine Team phone at [phone] for questions and concerns.

## 2020-10-30 NOTE — Progress Notes (Signed)
Surprisingly enough, Beth Sanchez absolutely is hanging in there.  What I am amazed by is the fact that I can now hear some bowel sounds.  I have never heard these before.  I realize this is not a lot bowel sounds but again I have never heard of these before.  Her abdomen does not look as distended.  She said that she is eating a little bit.  It is conceivable that the chemotherapy that we did give her is working a little bit.  It may have taken a little bit longer than typical but again we might be seeing a response.  I know this is quite early to know what might be going on.  However, I think it would be worthwhile to check some labs on her.  I think it might be worthwhile to get a abdominal x-ray to see how this looks.  She just seems to be holding her own.  She is quite alert this morning.  She says she is not having much pain.  There is no bleeding.  She says she is passing gas.  On her exam,, her blood pressure is 96/68.  I know this is still on the lower side.  Her pulse is 93.  Oxygen saturation is 99%.  Her lungs sound clear bilaterally.  She has good breath sounds bilaterally.  Her cardiac exam is regular rate and rhythm.  Abdomen is soft.  She does have some bowel sounds.  There is not much fluid wave.  There is no guarding or rebound tenderness.  Extremity shows no clubbing, cyanosis or edema.  Maybe I am being overly optimistic.  However, as not like Ms. Crump is all that old.  She has been in very good health otherwise.  I just want to make sure that we are not "premature" in thinking that her cancer is not responsive to treatment.  We will have to see what her labs show.  I want to see what an x-ray shows.  Again, may be I am being overly optimistic with Ms. Epping's situation.  We will certainly follow her closely as always.  I do appreciate the outstanding care as she will get from all the staff up on 3 E.  Lattie Haw, MD  [place] 6:8

## 2020-10-30 NOTE — Progress Notes (Signed)
PROGRESS NOTE    Beth Sanchez  [id number] DOB: [birth year] DOA: 10/05/2020 PCP: Pcp, No    Chief Complaint  Patient presents with   Abdominal Pain    Vomiting, diarrhea    Brief Narrative:   65 year old woman with prior history of hypertension, type 2 diabetes, cirrhosis with peritoneal carcinomatosis presents to ED with back pain and abdominal distention associated with nausea and vomiting. She was diagnosed with COVID 46 in August at which time she was found to have new cirrhosis, ascites and ultimately diagnosed with, peritoneal carcinomatosis.  She underwent multiple paracentesis, and 1 dose of chemotherapy.  Unfortunately patient could not tolerate further chemotherapy and was admitted for the above, developed hypotension requiring transfer to ICU for pressor support.  After further discussions with PCCM and oncology, patient and her sister transition to DNR. Weaned her off dilaudid pCA.   Assessment & Plan:   Principal Problem:   Primary peritoneal carcinomatosis ([place]) Active Problems:   Essential hypertension   Hyperlipidemia associated with type 2 diabetes mellitus ([place])   Type 2 diabetes mellitus with hyperglycemia (HCC)   Cirrhosis of liver (HCC)   Malignant ascites   Iron deficiency anemia   AKI (acute kidney injury) ([place])   Hyperkalemia   Hyponatremia   Gastritis   Goals of care, counseling/discussion   Palliative care by specialist   N&V (nausea and vomiting)   DNR (do not resuscitate)   Comfort measures only status   Shock circulatory ([place])   COVID-19 virus infection   Acute hypoxemic respiratory failure due to COVID-19 ([place])   Failure to thrive in adult   Protein-calorie malnutrition, moderate (HCC)   Ovarian cancer on right ([place])   Cancer associated pain   Hypovolemic shock presumed to be septic likely source from left lower lobe pneumonia with a component of chronic hypotension Required pressors briefly in ICU, currently off pressors with  borderline blood pressures. Completed the course of antibiotics for the pneumonia.    Intractable nausea and vomiting probably secondary to carcinomatosis As needed's Phenergan. PT DENIES any nausea or vomiting and abdominal pain has improved.  Patient reports passing gas and has taken some food by mouth.  AKI in the setting of hypovolemic shock Foley catheter left in place for palliative reasons. Would not recommend further blood work at this time as it would not change the plan/treatment   Acute hypoxic respiratory failure on 10/22/2020 Probably secondary to combination of COVID and left lower lobe pneumonia Nasal cannula oxygen to keep sats greater than 88% Completed a course of antibiotics Patient is currently on room air with good oxygen sats.    Liver cirrhosis diagnosed last month Symptom management    Peritoneal carcinomatosis with malignant ascites ?  Right-sided ovarian cancer Oncology and palliative on board Not a candidate for any kind of chemotherapy at this time Prognosis remains very poor, though she remains stable this morning.  She has a peritoneal drain placed on 10/23/2020 Focus on comfort She was started on fentanyl patch for pain, wean her off the PCA pump,  increase the fentanyl patch dose as required.    Anemia of chronic illness No signs of active bleeding at this time.   Type 2 diabetes Non-insulin-dependent holding all oral medications due to poor intake. CBG (last 3)  Recent Labs    10/29/20 0834 10/29/20 1220 10/29/20 1656  GLUCAP 81 75 71      Failure to thrive present on admission probably secondary to metastatic cancer.   Moderate protein calorie  malnutrition present on admission    DVT prophylaxis: Comfort care  code Status: (DNR Family Communication: Discussed with the sister over the phone on 10/29/20. Disposition:   Status is: Inpatient  Remains inpatient appropriate because:Unsafe d/c plan  Dispo: The patient is from:  Home              Anticipated d/c is to:  pending              Patient currently is medically stable to d/c.   Difficult to place patient No       Consultants:  Oncology PCCM  Procedures: (None)  Antimicrobials: none   Subjective: No chest pain or sob, no nausea,   Objective: Vitals:   10/30/20 0410 10/30/20 0549 10/30/20 0900 10/30/20 1333  BP:  (!) 87/62  96/68  Pulse:  85  93  Resp: '16 16 13 18  '$ Temp:  98 F (36.7 C)  97.7 F (36.5 C)  TempSrc:  Oral  Oral  SpO2: 93% 100% 96% 99%  Weight:      Height:        Intake/Output Summary (Last 24 hours) at 10/30/2020 1456 Last data filed at 10/30/2020 1400 Gross per 24 hour  Intake 935.4 ml  Output 2775 ml  Net -1839.6 ml    Filed Weights   10/06/20 0000 10/08/20 1351 10/29/20 0500  Weight: 87.7 kg 87.7 kg 81.2 kg    Examination:  General exam: Elderly woman, ill-appearing, not appear to be in any kind of distress Respiratory system: Air entry fair bilateral no wheezing or rhonchi on room air Cardiovascular system: S1-S2 heard regular rate rhythm, no JVD Gastrointestinal system: Abdomen is soft, mildly distended bowel sounds are minimal  Central nervous system: alert and able to Answer questions Extremities: No pedal edema Skin: No rashes seen Psychiatry: Anxious    Data Reviewed: I have personally reviewed following labs and imaging studies  CBC: No results for input(s): WBC, NEUTROABS, HGB, HCT, MCV, PLT in the last 168 hours.  Basic Metabolic Panel: No results for input(s): NA, K, CL, CO2, GLUCOSE, BUN, CREATININE, CALCIUM, MG, PHOS in the last 168 hours.  GFR: Estimated Creatinine Clearance: 27.7 mL/min (A) (by C-G formula based on SCr of 2.22 mg/dL (H)).  Liver Function Tests: No results for input(s): AST, ALT, ALKPHOS, BILITOT, PROT, ALBUMIN in the last 168 hours.  CBG: Recent Labs  Lab 10/28/20 2350 10/29/20 0508 10/29/20 0834 10/29/20 1220 10/29/20 1656  GLUCAP 91 86 81 75 71       Recent Results (from the past 240 hour(s))  Culture, blood (routine x 2)     Status: None   Collection Time: 10/22/20  4:34 AM   Specimen: BLOOD  Result Value Ref Range Status   Specimen Description   Final    BLOOD BLOOD LEFT HAND Performed at [facility] [address] [place], [place] [address]    Special Requests   Final    BOTTLES DRAWN AEROBIC AND ANAEROBIC Blood Culture adequate volume Performed at [facility] [address] [place], [place] [address]    Culture   Final    NO GROWTH 5 DAYS Performed at [facility], [place] [address], [place],  [address]    Report Status 10/27/2020 FINAL  Final  Culture, blood (routine x 2)     Status: None   Collection Time: 10/22/20  4:42 AM   Specimen: Right Antecubital; Blood  Result Value Ref Range Status  Specimen Description   Final    RIGHT ANTECUBITAL Performed at [facility] [address] [place], [place] [address]    Special Requests   Final    BOTTLES DRAWN AEROBIC ONLY Blood Culture results may not be optimal due to an inadequate volume of blood received in culture bottles Performed at [facility] [address] [place], [place] [address]    Culture   Final    NO GROWTH 5 DAYS Performed at [facility], [place] [address], [place], [place] [address]    Report Status 10/27/2020 FINAL  Final  MRSA Next Gen by PCR, Nasal     Status: None   Collection Time: 10/22/20 11:33 AM   Specimen: Nasal Mucosa; Nasal Swab  Result Value Ref Range Status   MRSA by PCR Next Gen NOT DETECTED NOT DETECTED Final    Comment: (NOTE) The GeneXpert MRSA Assay (FDA approved for NASAL specimens only), is one component of a comprehensive MRSA colonization surveillance program. It is not intended to diagnose MRSA infection nor to guide or monitor treatment for MRSA infections. Test performance is not FDA approved in patients less than 30  years old. Performed at [facility], [place] [address], [place], [place] [address]           Radiology Studies: No results found.      Scheduled Meds:  fentaNYL  1 patch Transdermal Q72H   fludrocortisone  0.2 mg Oral BID   lidocaine  15 mL Oral Once   mouth rinse  15 mL Mouth Rinse BID   midodrine  20 mg Oral TID WC   pantoprazole  40 mg Oral Q0600   sodium chloride flush  10-40 mL Intracatheter Q12H   Continuous Infusions:  sodium chloride     sodium chloride Stopped (10/29/20 0950)   promethazine (PHENERGAN) injection (IM or IVPB) 12.5 mg (10/30/20 1113)     LOS: 25 days        Hosie Poisson, MD [facility]   To contact the attending provider between 7A-7P or the covering provider during after hours 7P-7A, please log into the web site [email or web address] and access using universal [place] password for that web site. If you do not have the password, please call the hospital operator.  10/30/2020, 2:56 PM

## 2020-10-31 DIAGNOSIS — G893 Neoplasm related pain (acute) (chronic): Secondary | ICD-10-CM | POA: Diagnosis not present

## 2020-10-31 DIAGNOSIS — C482 Malignant neoplasm of peritoneum, unspecified: Secondary | ICD-10-CM | POA: Diagnosis not present

## 2020-10-31 DIAGNOSIS — K567 Ileus, unspecified: Secondary | ICD-10-CM | POA: Diagnosis not present

## 2020-10-31 DIAGNOSIS — J9601 Acute respiratory failure with hypoxia: Secondary | ICD-10-CM

## 2020-10-31 DIAGNOSIS — N179 Acute kidney failure, unspecified: Secondary | ICD-10-CM | POA: Diagnosis not present

## 2020-10-31 DIAGNOSIS — U071 COVID-19: Secondary | ICD-10-CM | POA: Diagnosis not present

## 2020-10-31 DIAGNOSIS — R188 Other ascites: Secondary | ICD-10-CM | POA: Diagnosis not present

## 2020-10-31 DIAGNOSIS — Z7189 Other specified counseling: Secondary | ICD-10-CM | POA: Diagnosis not present

## 2020-10-31 MED ORDER — TBO-FILGRASTIM 480 MCG/0.8ML ~~LOC~~ SOSY
480.0000 ug | PREFILLED_SYRINGE | Freq: Once | SUBCUTANEOUS | Status: AC
  Administered 2020-10-31: 480 ug via SUBCUTANEOUS
  Filled 2020-10-31: qty 0.8

## 2020-10-31 NOTE — Plan of Care (Signed)
  Problem: Education: Goal: Knowledge of General Education information will improve Description: Including pain rating scale, medication(s)/side effects and non-pharmacologic comfort measures Outcome: Progressing   Problem: Coping: Goal: Level of anxiety will decrease Outcome: Progressing   Problem: Pain Managment: Goal: General experience of comfort will improve Outcome: Progressing   

## 2020-10-31 NOTE — Progress Notes (Signed)
PROGRESS NOTE    Beth Sanchez  [id number] DOB: 27-Apr-1955 DOA: 10/05/2020 PCP: Pcp, No    Chief Complaint  Patient presents with   Abdominal Pain    Vomiting, diarrhea    Brief Narrative:   65 year old woman with prior history of hypertension, type 2 diabetes, cirrhosis with peritoneal carcinomatosis presents to ED with back pain and abdominal distention associated with nausea and vomiting. She was diagnosed with COVID 52 in August at which time she was found to have new cirrhosis, ascites and ultimately diagnosed with, peritoneal carcinomatosis.  She underwent multiple paracentesis, and 1 dose of chemotherapy.  Unfortunately patient could not tolerate further chemotherapy and was admitted for the above, developed hypotension requiring transfer to ICU for pressor support.  After further discussions with PCCM and oncology, patient and her sister transition to DNR. Weaned her off dilaudid pCA.  PT seen and examined. She appears comfortable. No BM yet.   Assessment & Plan:   Principal Problem:   Primary peritoneal carcinomatosis ([place]) Active Problems:   Essential hypertension   Hyperlipidemia associated with type 2 diabetes mellitus ([place])   Type 2 diabetes mellitus with hyperglycemia (HCC)   Cirrhosis of liver (HCC)   Malignant ascites   Iron deficiency anemia   AKI (acute kidney injury) ([place])   Hyperkalemia   Hyponatremia   Gastritis   Goals of care, counseling/discussion   Palliative care by specialist   N&V (nausea and vomiting)   DNR (do not resuscitate)   Comfort measures only status   Shock circulatory ([place])   COVID-19 virus infection   Acute hypoxemic respiratory failure due to COVID-19 ([place])   Failure to thrive in adult   Protein-calorie malnutrition, moderate (HCC)   Ovarian cancer on right ([place])   Cancer associated pain   Hypovolemic shock presumed to be septic likely source from left lower lobe pneumonia with a component of chronic hypotension Required  pressors briefly in ICU, currently off pressors . BP parameters have been optimal.  Completed the course of antibiotics for the pneumonia.    Intractable nausea and vomiting probably secondary to carcinomatosis One episode of vomiting yesterday. Slightly nauseated. Abd pain has improved. Some abdominal distention present.  Plan for peritoneal drainage today to allow for comfort.   AKI in the setting of hypovolemic shock Labs revealed improvement in the creatinine.  Continue to follow labs as per oncology.    Acute hypoxic respiratory failure on 10/22/2020 Probably secondary to combination of COVID and left lower lobe pneumonia Nasal cannula oxygen to keep sats greater than 88% Completed a course of antibiotics Patient is currently on room air with good oxygen sats.   Liver cirrhosis diagnosed last month with malignant ascites.  Symptom management with peritoneal drainage.    Pancytopenia probably secondary to chemotherapy    Peritoneal carcinomatosis with malignant ascites ?  Right-sided ovarian cancer Oncology and palliative on board Not a candidate for any kind of chemotherapy at this time Prognosis remains very poor, though she remains stable this morning.  She has a peritoneal drain placed on 10/23/2020.  Focus on comfort She was started on fentanyl patch for pain, wean her off the PCA pump,  increase the fentanyl patch dose as required.    Anemia of chronic illness No signs of active bleeding at this time.   Type 2 diabetes Non-insulin-dependent holding all oral medications due to poor intake. CBG (last 3)  Recent Labs    10/29/20 0834 10/29/20 1220 10/29/20 1656  GLUCAP 81 75 71  No changes in meds.     Failure to thrive present on admission probably secondary to metastatic cancer.   Moderate protein calorie malnutrition present on admission    DVT prophylaxis: scd's code Status: (DNR) Family Communication: Discussed with the sister over the phone  on 10/29/20. Disposition:   Status is: Inpatient  Remains inpatient appropriate because:Unsafe d/c plan  Dispo: The patient is from: Home              Anticipated d/c is to:  pending  family to decide .               Patient currently is medically stable to d/c.   Difficult to place patient No       Consultants:  Oncology PCCM  Procedures: (None)  Antimicrobials: none   Subjective: One episode of vomiting, slightly nauseated this am.   Objective: Vitals:   10/30/20 0900 10/30/20 1333 10/30/20 2058 10/31/20 0519  BP:  96/68 96/69 114/86  Pulse:  93 98 82  Resp: '13 18 18 18  '$ Temp:  97.7 F (36.5 C) 97.6 F (36.4 C) 98.9 F (37.2 C)  TempSrc:  Oral Oral Oral  SpO2: 96% 99% 99% 100%  Weight:      Height:        Intake/Output Summary (Last 24 hours) at 10/31/2020 1203 Last data filed at 10/31/2020 1000 Gross per 24 hour  Intake 271.78 ml  Output 3625 ml  Net -3353.22 ml    Filed Weights   10/06/20 0000 10/08/20 1351 10/29/20 0500  Weight: 87.7 kg 87.7 kg 81.2 kg    Examination:  General exam: Elderly woman, ill-appearing, no distress noted Respiratory system: Air entry fair bilateral no wheezing heard on room air Cardiovascular system: S1-S2 heard, regular rate rhythm no JVD Gastrointestinal system: Abdomen is soft, mildly distended, peritoneal catheter in place bowel sounds minimal Central nervous system: Alert and able to answer all questions appropriately Extremities: No cyanosis Skin: No rashes seen Psychiatry: Mood is appropriate    Data Reviewed: I have personally reviewed following labs and imaging studies  CBC: Recent Labs  Lab 10/30/20 1816  WBC 1.3*  HGB 10.6*  HCT 33.3*  MCV 83.5  PLT 108*    Basic Metabolic Panel: Recent Labs  Lab 10/30/20 1816  NA 135  K 4.5  CL 109  CO2 17*  GLUCOSE 65*  BUN 33*  CREATININE 1.10*  CALCIUM 8.3*    GFR: Estimated Creatinine Clearance: 55.9 mL/min (A) (by C-G formula based on SCr of  1.1 mg/dL (H)).  Liver Function Tests: Recent Labs  Lab 10/30/20 1816  AST 17  ALT 12  ALKPHOS 140*  BILITOT 1.0  PROT 5.6*  ALBUMIN 2.3*    CBG: Recent Labs  Lab 10/28/20 2350 10/29/20 0508 10/29/20 0834 10/29/20 1220 10/29/20 1656  GLUCAP 91 86 81 75 71      Recent Results (from the past 240 hour(s))  Culture, blood (routine x 2)     Status: None   Collection Time: 10/22/20  4:34 AM   Specimen: BLOOD  Result Value Ref Range Status   Specimen Description   Final    BLOOD BLOOD LEFT HAND Performed at [facility] [address], [place], [place] [address]    Special Requests   Final    BOTTLES DRAWN AEROBIC AND ANAEROBIC Blood Culture adequate volume Performed at [facility] [address], [place], [place] [address]    Culture   Final  NO GROWTH 5 DAYS Performed at [facility], [place] [address], [place], [place] [address]    Report Status 10/27/2020 FINAL  Final  Culture, blood (routine x 2)     Status: None   Collection Time: 10/22/20  4:42 AM   Specimen: Right Antecubital; Blood  Result Value Ref Range Status   Specimen Description   Final    RIGHT ANTECUBITAL Performed at [facility] [address], [place], [place] [address]    Special Requests   Final    BOTTLES DRAWN AEROBIC ONLY Blood Culture results may not be optimal due to an inadequate volume of blood received in culture bottles Performed at [facility] [address] [place], [place] [address]    Culture   Final    NO GROWTH 5 DAYS Performed at [facility], [place] [address], [place], [place] [address]    Report Status 10/27/2020 FINAL  Final  MRSA Next Gen by PCR, Nasal     Status: None   Collection Time: 10/22/20 11:33 AM   Specimen: Nasal Mucosa; Nasal Swab  Result Value Ref Range Status   MRSA by PCR Next Gen NOT DETECTED NOT DETECTED Final    Comment: (NOTE) The GeneXpert MRSA Assay (FDA  approved for NASAL specimens only), is one component of a comprehensive MRSA colonization surveillance program. It is not intended to diagnose MRSA infection nor to guide or monitor treatment for MRSA infections. Test performance is not FDA approved in patients less than 83 years old. Performed at [facility], [place] [address] [place], [place] [address]           Radiology Studies: DG Abd Portable 1V  Result Date: 10/30/2020 CLINICAL DATA:  Nausea, constipation, indwelling peritoneal drain EXAM: PORTABLE ABDOMEN - 1 VIEW COMPARISON:  10/22/2020 FINDINGS: Supine frontal views of the abdomen and pelvis are obtained. Peritoneal catheter is seen within the right mid abdomen. Distended gas-filled loops of small bowel are seen within the left mid abdomen measuring up to 4 cm in diameter. Gas and stool are seen throughout the colon to the level of the rectum. No significant fecal retention. No abdominal masses or abnormal calcifications. IMPRESSION: 1. Gaseous distention of the small bowel, without evidence of high-grade obstruction. This could reflect ileus. 2. No significant fecal retention. 3. Indwelling peritoneal drainage catheter as above. Electronically Signed   By: Randa Ngo M.D.   On: 10/30/2020 19:33        Scheduled Meds:  fentaNYL  1 patch Transdermal Q72H   fludrocortisone  0.2 mg Oral BID   lidocaine  15 mL Oral Once   mouth rinse  15 mL Mouth Rinse BID   midodrine  20 mg Oral TID WC   pantoprazole  40 mg Oral Q0600   sodium chloride flush  10-40 mL Intracatheter Q12H   Tbo-Filgrastim  480 mcg Subcutaneous Once   Continuous Infusions:  sodium chloride     sodium chloride Stopped (10/29/20 0950)   promethazine (PHENERGAN) injection (IM or IVPB) 12.5 mg (10/31/20 0923)     LOS: 26 days        Hosie Poisson, MD [facility]   To contact the attending provider between 7A-7P or the covering provider during after hours 7P-7A, please log  into the web site [email or web address] and access using universal [place] password for that web site. If you do not have the password, please call the hospital operator.  10/31/2020, 12:03 PM

## 2020-10-31 NOTE — Progress Notes (Signed)
Surprisingly, Beth Sanchez is still doing okay.  We did check some lab work on her yesterday.  Her white cell count was quite low at 1.3.  I had believe this is from the chemotherapy that she had.  I am going to give her 1 dose of Neupogen.  She is eating a little bit more.  Her blood pressure is better.  Her blood pressure is 114/86.  There is maybe a little bit more abdominal distention.  A lot of this is fluid.  I would like to think this by would be fluid since she has had ascites in the past.  We will see about opening up the peritoneal catheter and removing the ascites.  She had an abdominal x-ray yesterday.  This did show some bowel distention.  There was no obstruction.  Her electrolytes showed a BUN of 33 creatinine 1.1.  Calcium 8.3.  Her albumin was 2.3.  Again, it seems like she might be eating a little bit more.  She says she had 1 episode of emesis.  She is passing gas.  It will be interesting to see what her CA-125 is.  Again, I want to make sure that we are not premature in saying that she is not responding to treatment.  Again  it looks like the malignancy is just inner belly.  Again I do believe that this is a peritoneal carcinoma.  Typically, chemotherapy works well for this.  I suppose that we just may be seen a delayed response given the fact that she has only had partial treatment.  Her blood pressure is better.  This is encouraging.  I think the most encouraging aspect is the fact that she has bowel sounds.  She did not have bowel sounds all last week.  Hopefully, we will see that her CA-125 is improved.  I am very cautiously optimistic.   Lattie Haw, MD  Colossians 3:23

## 2020-11-01 DIAGNOSIS — C482 Malignant neoplasm of peritoneum, unspecified: Secondary | ICD-10-CM | POA: Diagnosis not present

## 2020-11-01 DIAGNOSIS — R188 Other ascites: Secondary | ICD-10-CM | POA: Diagnosis not present

## 2020-11-01 DIAGNOSIS — U071 COVID-19: Secondary | ICD-10-CM | POA: Diagnosis not present

## 2020-11-01 DIAGNOSIS — N179 Acute kidney failure, unspecified: Secondary | ICD-10-CM | POA: Diagnosis not present

## 2020-11-01 LAB — CBC WITH DIFFERENTIAL/PLATELET
Band Neutrophils: 6 %
Basophils Relative: 0 %
Blasts: NONE SEEN %
Eosinophils Relative: 0 %
HCT: 30.8 % — ABNORMAL LOW (ref 36.0–46.0)
Hemoglobin: 9.9 g/dL — ABNORMAL LOW (ref 12.0–15.0)
Lymphocytes Relative: 3 %
MCH: 26.8 pg (ref 26.0–34.0)
MCHC: 32.1 g/dL (ref 30.0–36.0)
MCV: 83.5 fL (ref 80.0–100.0)
Metamyelocytes Relative: NONE SEEN %
Monocytes Relative: 10 %
Myelocytes: NONE SEEN %
Neutrophils Relative %: 81 %
Platelets: 97 10*3/uL — ABNORMAL LOW (ref 150–400)
Promyelocytes Relative: NONE SEEN %
RBC Morphology: NORMAL
RBC: 3.69 MIL/uL — ABNORMAL LOW (ref 3.87–5.11)
RDW: 17.5 % — ABNORMAL HIGH (ref 11.5–15.5)
WBC Morphology: NORMAL
WBC: 3 10*3/uL — ABNORMAL LOW (ref 4.0–10.5)
nRBC: 0 % (ref 0.0–0.2)
nRBC: NONE SEEN /100 WBC

## 2020-11-01 LAB — HEMOGLOBIN AND HEMATOCRIT, BLOOD
HCT: 32.6 % — ABNORMAL LOW (ref 36.0–46.0)
Hemoglobin: 10.4 g/dL — ABNORMAL LOW (ref 12.0–15.0)

## 2020-11-01 MED ORDER — PANTOPRAZOLE SODIUM 40 MG PO TBEC
40.0000 mg | DELAYED_RELEASE_TABLET | Freq: Two times a day (BID) | ORAL | Status: DC
  Administered 2020-11-01 – 2020-11-02 (×2): 40 mg via ORAL
  Filled 2020-11-01 (×2): qty 1

## 2020-11-01 MED ORDER — ENSURE MAX PROTEIN PO LIQD
11.0000 [oz_av] | Freq: Two times a day (BID) | ORAL | Status: DC
  Filled 2020-11-01 (×6): qty 330

## 2020-11-01 MED ORDER — SUCRALFATE 1 GM/10ML PO SUSP
1.0000 g | Freq: Four times a day (QID) | ORAL | Status: DC
  Administered 2020-11-01 – 2020-11-02 (×3): 1 g via ORAL
  Filled 2020-11-01 (×3): qty 10

## 2020-11-01 MED ORDER — GUAIFENESIN-DM 100-10 MG/5ML PO SYRP
5.0000 mL | ORAL_SOLUTION | ORAL | Status: DC | PRN
  Administered 2020-11-01 (×2): 5 mL via ORAL
  Filled 2020-11-01 (×2): qty 10

## 2020-11-01 NOTE — Progress Notes (Signed)
PROGRESS NOTE    Beth Sanchez  [id number] DOB: 12-18-1955 DOA: 10/05/2020 PCP: Pcp, No    Chief Complaint  Patient presents with   Abdominal Pain    Vomiting, diarrhea    Brief Narrative:   65 year old woman with prior history of hypertension, type 2 diabetes, cirrhosis with peritoneal carcinomatosis presents to ED with back pain and abdominal distention associated with nausea and vomiting. She was diagnosed with COVID 2 in August at which time she was found to have new cirrhosis, ascites and ultimately diagnosed with, peritoneal carcinomatosis.  She underwent multiple paracentesis, and 1 dose of chemotherapy.  Unfortunately patient could not tolerate further chemotherapy and was admitted for the above, developed hypotension requiring transfer to ICU for pressor support.  After further discussions with PCCM and oncology, patient and her sister transition to DNR. Weaned her off dilaudid pCA.  PT seen and examined.  She appears uncomfortable, reported 2 bowel movements in the last 24 hours.  No chest pain but reports cough and heart burn.   Assessment & Plan:   Principal Problem:   Primary peritoneal carcinomatosis ([place]) Active Problems:   Essential hypertension   Hyperlipidemia associated with type 2 diabetes mellitus ([place])   Type 2 diabetes mellitus with hyperglycemia (HCC)   Cirrhosis of liver (HCC)   Malignant ascites   Iron deficiency anemia   AKI (acute kidney injury) ([place])   Hyperkalemia   Hyponatremia   Gastritis   Goals of care, counseling/discussion   Palliative care by specialist   N&V (nausea and vomiting)   DNR (do not resuscitate)   Comfort measures only status   Shock circulatory ([place])   COVID-19 virus infection   Acute hypoxemic respiratory failure due to COVID-19 ([place])   Failure to thrive in adult   Protein-calorie malnutrition, moderate (HCC)   Ovarian cancer on right ([place])   Cancer associated pain   Hypovolemic shock presumed to be septic  likely source from left lower lobe pneumonia with a component of chronic hypotension Required pressors briefly in ICU, currently off pressors . BP parameters have been optimal.  Completed the course of antibiotics for the pneumonia.    Intractable nausea and vomiting probably secondary to carcinomatosis No vomiting, nauseated, pt reports heart burn.  Added carafate to protonix.   AKI in the setting of hypovolemic shock Labs revealed improvement in the creatinine.  Continue to follow labs as per oncology.    Acute hypoxic respiratory failure on 10/22/2020 Probably secondary to combination of COVID and left lower lobe pneumonia Nasal cannula oxygen to keep sats greater than 88% Completed a course of antibiotics Patient is currently on room air with good oxygen sats.   Liver cirrhosis diagnosed last month with malignant ascites.  Symptom management with peritoneal drainage as needed.    Pancytopenia probably secondary to chemotherapy Hemoglobin stable around 10.    Peritoneal carcinomatosis with malignant ascites ?  Right-sided ovarian cancer Oncology and palliative on board Not a candidate for any kind of chemotherapy at this time Prognosis remains very poor, though she remains stable this morning.  She has a peritoneal drain placed on 10/23/2020.  Focus on comfort She was started on fentanyl patch for pain, weaned her off the PCA pump,  increase the fentanyl patch dose as required.   Anemia of chronic illness No signs of active bleeding at this time.   Type 2 diabetes Non-insulin-dependent holding all oral medications due to poor intake. CBG (last 3)  Recent Labs    10/29/20 1656  GLUCAP 71   No changes in meds.     Failure to thrive present on admission probably secondary to metastatic cancer.   Moderate protein calorie malnutrition present on admission    DVT prophylaxis: scd's code Status: (DNR) Family Communication: Discussed with the sister over the  phone on 10/29/20. Disposition:   Status is: Inpatient  Remains inpatient appropriate because:Unsafe d/c plan  Dispo: The patient is from: Home              Anticipated d/c is to:  pending  family to decide .               Patient currently is medically stable to d/c.   Difficult to place patient No       Consultants:  Oncology PCCM  Procedures: (None)  Antimicrobials: none   Subjective: No vomiting today. 2 BM today.   Objective: Vitals:   10/31/20 2135 11/01/20 0506 11/01/20 0604 11/01/20 1340  BP: 105/72 96/67  90/69  Pulse: 93 72  95  Resp: '16 16  18  '$ Temp: 98.4 F (36.9 C) (!) 97.3 F (36.3 C) 97.6 F (36.4 C) 98.4 F (36.9 C)  TempSrc: Oral Oral Oral   SpO2: 98% 98%  100%  Weight:      Height:        Intake/Output Summary (Last 24 hours) at 11/01/2020 1615 Last data filed at 11/01/2020 1500 Gross per 24 hour  Intake 800 ml  Output 700 ml  Net 100 ml    Filed Weights   10/06/20 0000 10/08/20 1351 10/29/20 0500  Weight: 87.7 kg 87.7 kg 81.2 kg    Examination: General exam: Appears calm and comfortable  Respiratory system: Clear to auscultation. Respiratory effort normal. Cardiovascular system: S1 & S2 heard, RRR. No JVD, murmurs, No pedal edema. Gastrointestinal system: Abdomen is  soft, Non tender, Non distended, Bowel sounds wnl Central nervous system: Alert and oriented. No focal neurological deficits. Extremities: Symmetric 5 x 5 power. Skin: No rashes, lesions or ulcers Psychiatry: Mood & affect appropriate.      Data Reviewed: I have personally reviewed following labs and imaging studies  CBC: Recent Labs  Lab 10/30/20 1816 11/01/20 0321 11/01/20 1346  WBC 1.3* 3.0*  --   HGB 10.6* 9.9* 10.4*  HCT 33.3* 30.8* 32.6*  MCV 83.5 83.5  --   PLT 108* 97*  --      Basic Metabolic Panel: Recent Labs  Lab 10/30/20 1816  NA 135  K 4.5  CL 109  CO2 17*  GLUCOSE 65*  BUN 33*  CREATININE 1.10*  CALCIUM 8.3*      GFR: Estimated Creatinine Clearance: 55.9 mL/min (A) (by C-G formula based on SCr of 1.1 mg/dL (H)).  Liver Function Tests: Recent Labs  Lab 10/30/20 1816  AST 17  ALT 12  ALKPHOS 140*  BILITOT 1.0  PROT 5.6*  ALBUMIN 2.3*     CBG: Recent Labs  Lab 10/28/20 2350 10/29/20 0508 10/29/20 0834 10/29/20 1220 10/29/20 1656  GLUCAP 91 86 81 75 71      No results found for this or any previous visit (from the past 240 hour(s)).         Radiology Studies: No results found.      Scheduled Meds:  fentaNYL  1 patch Transdermal Q72H   fludrocortisone  0.2 mg Oral BID   lidocaine  15 mL Oral Once   mouth rinse  15 mL Mouth Rinse BID   midodrine  20  mg Oral TID WC   pantoprazole  40 mg Oral BID   Ensure Max Protein  11 oz Oral BID   sodium chloride flush  10-40 mL Intracatheter Q12H   sucralfate  1 g Oral Q6H   Continuous Infusions:  sodium chloride     sodium chloride Stopped (10/29/20 0950)   promethazine (PHENERGAN) injection (IM or IVPB) Stopped (11/01/20 1123)     LOS: 27 days        Hosie Poisson, MD [facility]   To contact the attending provider between 7A-7P or the covering provider during after hours 7P-7A, please log into the web site [email or web address] and access using universal [place] password for that web site. If you do not have the password, please call the hospital operator.  11/01/2020, 4:15 PM

## 2020-11-01 NOTE — Progress Notes (Signed)
It seems as if Beth Sanchez is improving.  I am not sure that any fluid was taken off her abdomen yesterday.  There is no note about this.  She says she ate yesterday.  She says she did not get sick.  She said that she had diarrhea.  I did give her 1 dose of Neupogen.  Her white cell count has come up to 3.  Hemoglobin is 9.9.  Platelet count 97,000.  I am still awaiting the CA-125.  She has not complained of any pain.  She is having no bleeding.  There is quite a bit of bruising.  She has had no fever.  There is no cough or shortness of breath.  Again, I have to believe that she is finally responding.  She has bowel sounds now.  It sounds as if her intestines are starting to work again.  She is having diarrhea.  Her vital signs show temperature 97.6.  Pulse 72.  Blood pressure 96/67.  The blood pressure still on the lower side.  Hopefully, this will improve with nutritional intake.  May we should try her on some Ensure.  Again, I am cautiously optimistic.  I think the CA-125 will help [place] out.  It would be nice to maybe to see if she can get out of bed and sit in a chair see how she feels.  I do appreciate the outstanding care she is getting from the wonderful staff on [address].  I know that you guys are very hard-working and incredibly compassionate.  Lattie Haw, MD  Psalm 147:3

## 2020-11-01 NOTE — Progress Notes (Signed)
Daily Progress Note   Patient Name: Beth Sanchez       Date: 11/01/2020 DOB: November 02, 1955  Age: 65 y.o. MRN#: [id number] Attending Physician: Hosie Poisson, MD Primary Care Physician: Pcp, No Admit Date: 10/05/2020  Reason for Consultation/Follow-up: Establishing goals of care  Subjective: I met today with Beth Sanchez and her sister, Beth Sanchez.  At time of my encounter, they were eating dinner.  Beth Sanchez had had a couple bites of hamburger and she reports she is full and worried she may bring it back up later.  We discussed current care plan continue current therapies.  Reviewed lab values with her sister per her request and discussed that she had Granix this morning per Dr. Antonieta Pert recommendation.  Beth Sanchez reports her main concern at this time is that she had trouble getting into see Beth Sanchez today as she had had other visitors previously.  She reports her pain has been fairly well controlled overall.  She is now on a fentanyl patch and receiving intermittent doses of Dilaudid.  We will continue to follow this closely.  We may need to consider uptitrating her fentanyl patch.  Length of Stay: 27  Current Medications: Scheduled Meds:   fentaNYL  1 patch Transdermal Q72H   fludrocortisone  0.2 mg Oral BID   lidocaine  15 mL Oral Once   mouth rinse  15 mL Mouth Rinse BID   midodrine  20 mg Oral TID WC   pantoprazole  40 mg Oral Q0600   Ensure Max Protein  11 oz Oral BID   sodium chloride flush  10-40 mL Intracatheter Q12H    Continuous Infusions:  sodium chloride     sodium chloride Stopped (10/29/20 0950)   promethazine (PHENERGAN) injection (IM or IVPB) 12.5 mg (11/01/20 0348)    PRN Meds: sodium chloride, acetaminophen **OR** acetaminophen, HYDROmorphone (DILAUDID) injection,  prochlorperazine, sodium chloride flush  Physical Exam         Constitutional:      General: She is not in acute distress. HENT:     Head: Normocephalic and atraumatic.  Cardiovascular:     Rate and Rhythm: Normal rate.  Pulmonary:     Effort: Pulmonary effort is normal.  Skin:    General: Skin is warm and dry.   Vital Signs: BP 96/67 (BP Location: Right Arm)  Pulse 72   Temp 97.6 F (36.4 C) (Oral)   Resp 16   Ht _0  (1.702 m)   Wt 81.2 kg   SpO2 98%   BMI 28.04 kg/m  SpO2: SpO2: 98 % O2 Device: O2 Device: Room Air O2 Flow Rate: O2 Flow Rate (L/min): 0 L/min  Intake/output summary:  Intake/Output Summary (Last 24 hours) at 11/01/2020 0817 Last data filed at 11/01/2020 0720 Gross per 24 hour  Intake 250 ml  Output 250 ml  Net 0 ml    LBM: Last BM Date: 10/31/20 Baseline Weight: Weight: 87.7 kg Most recent weight: Weight: 81.2 kg       Palliative Assessment/Data:      Patient Active Problem List   Diagnosis Date Noted   DNR (do not resuscitate) 10/22/2020   Comfort measures only status 10/22/2020   Shock circulatory ([place]) 10/22/2020   Acute hypoxemic respiratory failure due to COVID-19 ([place]) 10/22/2020   Palliative care by specialist 10/21/2020   Goals of care, counseling/discussion 10/12/2020   Gastritis 10/08/2020   Hyperkalemia    Hyponatremia    Cirrhosis of liver ([place]) 10/05/2020   Malignant ascites 10/05/2020   Iron deficiency anemia 10/05/2020   AKI (acute kidney injury) ([place]) 10/05/2020   Primary peritoneal carcinomatosis ([place]) 10/05/2020   N&V (nausea and vomiting) 10/05/2020   COVID-19 virus infection 10/05/2020   Failure to thrive in adult 10/05/2020   Protein-calorie malnutrition, moderate ([place]) 10/05/2020   Ovarian cancer on right ([place]) 10/05/2020   Cancer associated pain 10/05/2020   Dyspepsia 09/10/2020   Type 2 diabetes mellitus with hyperglycemia ([place]) 09/10/2020   SOB (shortness of breath) 09/10/2020   Hyperlipidemia  associated with type 2 diabetes mellitus ([place]) 10/23/2016   Osteoarthritis of both knees 07/08/2014   URTICARIA 01/09/2009   GASTROENTERITIS 11/03/2008   Essential hypertension 03/28/2007   PSORIASIS 03/28/2007    Palliative Care Assessment & Plan   Patient Profile: 65 y.o. female  with past medical history of HTN, DMII, HLD, and obesity  admitted on 10/05/2020 with abdominal pain, distention, low back pain, emesis, and fatigue.  On admission, patient found to have liver cirrhosis with ascites, AKI, hypotension, mild hyponatermia, and COVID-19.   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 She is being followed by Dr. Martha Clan. She had 1 dose of carboplatinum without improvement. Chemotherapy is on hold given patient's hypotensive state. She is unable to tolerate PO intake without N/V.  She had a PleurX catheter placed in IR with 4.5L output.  Recommendations/Plan: DNR/DNI Continue current care.  She is very appreciative of Dr. Antonieta Pert guidance and input. Palliative will continue to follow peripherally.  Code Status:    Code Status Orders  (From admission, onward)           Start     Ordered   10/22/20 0718  Do not attempt resuscitation (DNR)  Continuous       Question Answer Comment  In the event of cardiac or respiratory ARREST Do not call a "code blue"   In the event of cardiac or respiratory ARREST Do not perform Intubation, CPR, defibrillation or ACLS   In the event of cardiac or respiratory ARREST Use medication by any route, position, wound care, and other measures to relive pain and suffering. May use oxygen, suction and manual treatment of airway obstruction as needed for comfort.      10/22/20 8119  Code Status History     Date Active Date Inactive Code Status Order ID Comments User Context   10/05/2020 1703 10/22/2020 0716 Full Code [id number]   Lequita Halt, MD ED       Prognosis: Guarded  Discharge Planning: To Be Determined  Care plan was discussed with patient, sister, bedside care team  Thank you for allowing the Palliative Medicine Team to assist in the care of this patient.   Total Time 30 Prolonged Time Billed No   Greater than 50%  of this time was spent counseling and coordinating care related to the above assessment and plan.  Micheline Rough, MD  Please contact Palliative Medicine Team phone at [phone] for questions and concerns.

## 2020-11-02 DIAGNOSIS — N179 Acute kidney failure, unspecified: Secondary | ICD-10-CM | POA: Diagnosis not present

## 2020-11-02 DIAGNOSIS — R188 Other ascites: Secondary | ICD-10-CM | POA: Diagnosis not present

## 2020-11-02 DIAGNOSIS — U071 COVID-19: Secondary | ICD-10-CM | POA: Diagnosis not present

## 2020-11-02 DIAGNOSIS — C482 Malignant neoplasm of peritoneum, unspecified: Secondary | ICD-10-CM | POA: Diagnosis not present

## 2020-11-02 DIAGNOSIS — G893 Neoplasm related pain (acute) (chronic): Secondary | ICD-10-CM | POA: Diagnosis not present

## 2020-11-02 DIAGNOSIS — K567 Ileus, unspecified: Secondary | ICD-10-CM | POA: Diagnosis not present

## 2020-11-02 DIAGNOSIS — Z7189 Other specified counseling: Secondary | ICD-10-CM | POA: Diagnosis not present

## 2020-11-02 LAB — CBC
HCT: 32.2 % — ABNORMAL LOW (ref 36.0–46.0)
Hemoglobin: 10.3 g/dL — ABNORMAL LOW (ref 12.0–15.0)
MCH: 27 pg (ref 26.0–34.0)
MCHC: 32 g/dL (ref 30.0–36.0)
MCV: 84.3 fL (ref 80.0–100.0)
Platelets: 92 10*3/uL — ABNORMAL LOW (ref 150–400)
RBC: 3.82 MIL/uL — ABNORMAL LOW (ref 3.87–5.11)
RDW: 18.1 % — ABNORMAL HIGH (ref 11.5–15.5)
WBC: 4 10*3/uL (ref 4.0–10.5)
nRBC: 0 % (ref 0.0–0.2)

## 2020-11-02 LAB — COMPREHENSIVE METABOLIC PANEL
ALT: 12 U/L (ref 0–44)
AST: 14 U/L — ABNORMAL LOW (ref 15–41)
Albumin: 2.2 g/dL — ABNORMAL LOW (ref 3.5–5.0)
Alkaline Phosphatase: 138 U/L — ABNORMAL HIGH (ref 38–126)
Anion gap: 10 (ref 5–15)
BUN: 32 mg/dL — ABNORMAL HIGH (ref 8–23)
CO2: 19 mmol/L — ABNORMAL LOW (ref 22–32)
Calcium: 8.1 mg/dL — ABNORMAL LOW (ref 8.9–10.3)
Chloride: 107 mmol/L (ref 98–111)
Creatinine, Ser: 0.96 mg/dL (ref 0.44–1.00)
GFR, Estimated: >60 mL/min (ref >60)
Glucose, Bld: 73 mg/dL (ref 70–99)
Potassium: 3.8 mmol/L (ref 3.5–5.1)
Sodium: 136 mmol/L (ref 135–145)
Total Bilirubin: 1.1 mg/dL (ref 0.3–1.2)
Total Protein: 5.5 g/dL — ABNORMAL LOW (ref 6.5–8.1)

## 2020-11-02 LAB — CA 125: Cancer Antigen (CA) 125: 4179 U/mL — ABNORMAL HIGH (ref 0.0–38.1)

## 2020-11-02 MED ORDER — ACETAMINOPHEN 160 MG/5ML PO SOLN: 650.0000 mg | Freq: Four times a day (QID) | ORAL | Status: DC | PRN

## 2020-11-02 MED ORDER — FLUDROCORTISONE ACETATE 0.1 MG PO TABS
0.4000 mg | ORAL_TABLET | Freq: Two times a day (BID) | ORAL | Status: DC
  Administered 2020-11-02 – 2020-11-07 (×10): 0.4 mg via ORAL
  Filled 2020-11-02 (×17): qty 4

## 2020-11-02 MED ORDER — FLUDROCORTISONE ACETATE 0.1 MG PO TABS
0.4000 mg | ORAL_TABLET | Freq: Two times a day (BID) | ORAL | Status: DC
  Administered 2020-11-02: 0.4 mg via ORAL
  Filled 2020-11-02: qty 4

## 2020-11-02 MED ORDER — PANTOPRAZOLE SODIUM 40 MG IV SOLR
40.0000 mg | Freq: Every day | INTRAVENOUS | Status: DC
  Administered 2020-11-03 – 2020-11-04 (×2): 40 mg via INTRAVENOUS
  Filled 2020-11-02 (×2): qty 40

## 2020-11-02 MED ORDER — ACETAMINOPHEN 650 MG RE SUPP: 650.0000 mg | Freq: Four times a day (QID) | RECTAL | Status: DC | PRN

## 2020-11-02 NOTE — Progress Notes (Signed)
PROGRESS NOTE    Beth Sanchez  [id number] DOB: 17-Apr-1955 DOA: 10/05/2020 PCP: Pcp, No    Chief Complaint  Patient presents with   Abdominal Pain    Vomiting, diarrhea    Brief Narrative:   65 year old woman with prior history of hypertension, type 2 diabetes, cirrhosis with peritoneal carcinomatosis presents to ED with back pain and abdominal distention associated with nausea and vomiting. She was diagnosed with COVID 11 in August at which time she was found to have new cirrhosis, ascites and ultimately diagnosed with, peritoneal carcinomatosis.  She underwent multiple paracentesis, and 1 dose of chemotherapy.  Unfortunately patient could not tolerate further chemotherapy and was admitted for the above, developed hypotension requiring transfer to ICU for pressor support.  After further discussions with PCCM and oncology, patient and her sister transition to DNR. Weaned her off dilaudid pCA.  Pt seen and examined at bedside. Pt reports not feeling good.she wants all meds to be changed to IV, .   Assessment & Plan:   Principal Problem:   Primary peritoneal carcinomatosis ([place]) Active Problems:   Essential hypertension   Hyperlipidemia associated with type 2 diabetes mellitus ([place])   Type 2 diabetes mellitus with hyperglycemia (HCC)   Cirrhosis of liver (HCC)   Malignant ascites   Iron deficiency anemia   AKI (acute kidney injury) ([place])   Hyperkalemia   Hyponatremia   Gastritis   Goals of care, counseling/discussion   Palliative care by specialist   N&V (nausea and vomiting)   DNR (do not resuscitate)   Comfort measures only status   Shock circulatory ([place])   COVID-19 virus infection   Acute hypoxemic respiratory failure due to COVID-19 ([place])   Failure to thrive in adult   Protein-calorie malnutrition, moderate (HCC)   Ovarian cancer on right ([place])   Cancer associated pain   Hypovolemic shock presumed to be septic likely source from left lower lobe pneumonia with a  component of chronic hypotension Required pressors briefly in ICU, currently off pressors . BP parameters have been borderline low.  Completed the course of antibiotics for the pneumonia.    Intractable nausea and vomiting probably secondary to carcinomatosis Symptomatic management with anti emetics.  Pt requesting to change to IV or liquid form.   AKI in the setting of hypovolemic shock Labs revealed improvement in the creatinine.  Continue to follow labs as per oncology.    Acute hypoxic respiratory failure on 10/22/2020 Probably secondary to combination of COVID and left lower lobe pneumonia Nasal cannula oxygen to keep sats greater than 88% Completed a course of antibiotics Patient is currently on room air with good oxygen sats.   Liver cirrhosis diagnosed last month with malignant ascites.  Symptom management with peritoneal drainage as needed.    Pancytopenia probably secondary to chemotherapy Hemoglobin stable around 10.    Peritoneal carcinomatosis with malignant ascites ?  Right-sided ovarian cancer Oncology and palliative on board Not a candidate for any kind of chemotherapy at this time as per last week, but will defer further recommendations to Dr Marin Olp. Prognosis remains very poor,.  She has a peritoneal drain placed on 10/23/2020.  She was started on fentanyl patch for pain, weaned her off the PCA pump,  increase the fentanyl patch dose as required. She is good to be discharged to SNF if family wants to pursue aggressive management.  Will await oncology recommendations .   Anemia of chronic illness No signs of active bleeding at this time.   Type 2 diabetes  Non-insulin-dependent holding all oral medications due to poor intake.     Failure to thrive present on admission probably secondary to metastatic cancer.   Moderate protein calorie malnutrition present on admission    DVT prophylaxis: scd's code Status: (DNR) Family Communication: none at  bedside. Today.  Disposition:   Status is: Inpatient  Remains inpatient appropriate because:Unsafe d/c plan  Dispo: The patient is from: Home              Anticipated d/c is to:  pending  family to decide .               Patient currently is medically stable to d/c.   Difficult to place patient No       Consultants:  Oncology PCCM  Procedures: (None)  Antimicrobials: none   Subjective: Nauseated. No vomiting.   Objective: Vitals:   11/01/20 1340 11/02/20 0200 11/02/20 1301 11/02/20 1316  BP: 90/69 93/64  92/76  Pulse: 95 86  82  Resp: '18 16  16  '$ Temp: 98.4 F (36.9 C) 97.6 F (36.4 C)    TempSrc:  Oral    SpO2: 100% 98% 98% 96%  Weight:      Height:        Intake/Output Summary (Last 24 hours) at 11/02/2020 1731 Last data filed at 11/02/2020 1400 Gross per 24 hour  Intake 810 ml  Output 300 ml  Net 510 ml    Filed Weights   10/06/20 0000 10/08/20 1351 10/29/20 0500  Weight: 87.7 kg 87.7 kg 81.2 kg   Examination: General exam: elderly lady in mild discomfort.  Respiratory system: Clear to auscultation. Respiratory effort normal. Cardiovascular system: S1 & S2 heard, RRR. No JVD,  Gastrointestinal system: Abdomen is soft, distended, mild abd discomfort. Bowel sounds minimal Central nervous system: Alert and oriented. No focal neurological deficits. Extremities: Symmetric 5 x 5 power. Skin: No rashes, Psychiatry: flat affect.        Data Reviewed: I have personally reviewed following labs and imaging studies  CBC: Recent Labs  Lab 10/30/20 1816 11/01/20 0321 11/01/20 1346 11/02/20 0320  WBC 1.3* 3.0*  --  4.0  HGB 10.6* 9.9* 10.4* 10.3*  HCT 33.3* 30.8* 32.6* 32.2*  MCV 83.5 83.5  --  84.3  PLT 108* 97*  --  92*     Basic Metabolic Panel: Recent Labs  Lab 10/30/20 1816 11/02/20 0320  NA 135 136  K 4.5 3.8  CL 109 107  CO2 17* 19*  GLUCOSE 65* 73  BUN 33* 32*  CREATININE 1.10* 0.96  CALCIUM 8.3* 8.1*     GFR: Estimated  Creatinine Clearance: 64 mL/min (by C-G formula based on SCr of 0.96 mg/dL).  Liver Function Tests: Recent Labs  Lab 10/30/20 1816 11/02/20 0320  AST 17 14*  ALT 12 12  ALKPHOS 140* 138*  BILITOT 1.0 1.1  PROT 5.6* 5.5*  ALBUMIN 2.3* 2.2*     CBG: Recent Labs  Lab 10/28/20 2350 10/29/20 0508 10/29/20 0834 10/29/20 1220 10/29/20 1656  GLUCAP 91 86 81 75 71      No results found for this or any previous visit (from the past 240 hour(s)).         Radiology Studies: No results found.      Scheduled Meds:  fentaNYL  1 patch Transdermal Q72H   fludrocortisone  0.4 mg Oral BID   lidocaine  15 mL Oral Once   mouth rinse  15 mL Mouth Rinse BID  midodrine  20 mg Oral TID WC   [START ON 11/03/2020] pantoprazole (PROTONIX) IV  40 mg Intravenous Daily   Ensure Max Protein  11 oz Oral BID   sodium chloride flush  10-40 mL Intracatheter Q12H   Continuous Infusions:  sodium chloride     sodium chloride Stopped (10/29/20 0950)   promethazine (PHENERGAN) injection (IM or IVPB) 12.5 mg (11/02/20 0939)     LOS: 28 days        Hosie Poisson, MD [facility]   To contact the attending provider between 7A-7P or the covering provider during after hours 7P-7A, please log into the web site [email or web address] and access using universal [place] password for that web site. If you do not have the password, please call the hospital operator.  11/02/2020, 5:31 PM

## 2020-11-02 NOTE — Evaluation (Signed)
Physical Therapy Evaluation Patient Details Name: Beth Sanchez MRN: [id number] DOB: [birth year] Today's Date: 11/02/2020  History of Present Illness  65 yo female admitted with ascites, pain, hypotension, peritoneal cancer. Hx of DM, obesity, cirrhosis, COVID. Pt transfered to ICU due to Shock, presumed septic (likely source LLL PNA) with component of chronic hypotension. Pt continues to have N/V.  Clinical Impression  Pt presents with general deconditioning and decreased mobility secondary to the above diagnosis. Pt re-evaled today secondary to moving to ICU. Pt's last therapy session was on 10/21/20. Pt reports she has not been OOB since secondary to medical issues. Pt is currently deconditioned, but was able to tolerate LE bed exercises. Pt declined sitting EOB and standing. Pt reports she is agreeable to SNF placement prior to d/c home. Pt will continue to benefit from acute skilled PT to maximize mobility and Independence for d/c to next venue of care.      Recommendations for follow up therapy are one component of a multi-disciplinary discharge planning process, led by the attending physician.  Recommendations may be updated based on patient status, additional functional criteria and insurance authorization.  Follow Up Recommendations SNF    Equipment Recommendations  None recommended by PT    Recommendations for Other Services       Precautions / Restrictions Precautions Precautions: Fall Restrictions Weight Bearing Restrictions: No      Mobility  Bed Mobility   Bed Mobility: Rolling Rolling: Modified independent (Device/Increase time)         General bed mobility comments: Pt declined supine to sit secondary to fatigue from LE ther ex.    Transfers                 General transfer comment: Pt declined due to fatigue  Ambulation/Gait             General Gait Details: Pt declined due to fatigue  Stairs            Wheelchair Mobility     Modified Rankin (Stroke Patients Only)       Balance                                             Pertinent Vitals/Pain Pain Score: 6  Pain Location: back Pain Descriptors / Indicators: Discomfort Pain Intervention(s): Limited activity within patient's tolerance;Repositioned;Monitored during session    Home Living Family/patient expects to be discharged to:: Private residence Living Arrangements: Other relatives Available Help at Discharge: Family Type of Home: House       Home Layout: Able to live on main level with bedroom/bathroom Home Equipment: None      Prior Function Level of Independence: Independent               Hand Dominance        Extremity/Trunk Assessment   Upper Extremity Assessment Upper Extremity Assessment: Defer to OT evaluation    Lower Extremity Assessment Lower Extremity Assessment: Generalized weakness       Communication   Communication: No difficulties  Cognition Arousal/Alertness: Awake/alert Behavior During Therapy: WFL for tasks assessed/performed Overall Cognitive Status: Within Functional Limits for tasks assessed                                 General Comments: pt reports she lives with her  sister and 4 cats.      General Comments      Exercises General Exercises - Lower Extremity Ankle Circles/Pumps: AROM;Strengthening;Both;10 reps;Supine Quad Sets: AROM;Strengthening;Both;10 reps;Supine Short Arc Quad: AROM;Strengthening;Both;10 reps;Supine Heel Slides: AROM;Strengthening;Both;10 reps;Supine Hip ABduction/ADduction: AROM;Strengthening;Both;10 reps;Supine Straight Leg Raises: AROM;Strengthening;Both;10 reps;Supine   Assessment/Plan    PT Assessment Patient needs continued PT services  PT Problem List Decreased strength;Decreased mobility;Decreased activity tolerance;Decreased balance;Decreased knowledge of use of DME;Pain;Decreased safety awareness;Decreased range of  motion       PT Treatment Interventions DME instruction;Gait training;Therapeutic exercise;Balance training;Functional mobility training;Therapeutic activities;Patient/family education    PT Goals (Current goals can be found in the Care Plan section)  Acute Rehab PT Goals Patient Stated Goal: To get stronger PT Goal Formulation: With patient Time For Goal Achievement: 11/16/20 Potential to Achieve Goals: Good    Frequency Min 3X/week   Barriers to discharge        Co-evaluation               AM-PAC PT "6 Clicks" Mobility  Outcome Measure Help needed turning from your back to your side while in a flat bed without using bedrails?: None Help needed moving from lying on your back to sitting on the side of a flat bed without using bedrails?: A Little Help needed moving to and from a bed to a chair (including a wheelchair)?: A Little Help needed standing up from a chair using your arms (e.g., wheelchair or bedside chair)?: A Lot Help needed to walk in hospital room?: A Lot Help needed climbing 3-5 steps with a railing? : A Lot 6 Click Score: 16    End of Session   Activity Tolerance: Patient limited by fatigue Patient left: in bed;with call bell/phone within reach;with bed alarm set Nurse Communication: Mobility status PT Visit Diagnosis: Other abnormalities of gait and mobility (R26.89);Muscle weakness (generalized) (M62.81)    Time: 1010-1037 PT Time Calculation (min) (ACUTE ONLY): 27 min   Charges:   PT Evaluation $PT Re-evaluation: 1 Re-eval PT Treatments $Therapeutic Exercise: 8-22 mins         Lelon Mast 11/02/2020, 1:10 PM

## 2020-11-02 NOTE — Evaluation (Signed)
Clinical/Bedside Swallow Evaluation Patient Details  Name: Beth Sanchez MRN: [id number] Date of Birth: [birth year]  Today's Date: 11/02/2020 Time: SLP Start Time (ACUTE ONLY): [id number] SLP Stop Time (ACUTE ONLY): 1400 SLP Time Calculation (min) (ACUTE ONLY): 20 min  Past Medical History:  Past Medical History:  Diagnosis Date   AKI (acute kidney injury) ([place]) 10/05/2020   Cancer associated pain 10/05/2020   Cirrhosis of liver ([place]) 10/05/2020   Comfort measures only status 10/22/2020   COVID-19 virus infection 10/05/2020   DNR (do not resuscitate) 10/22/2020   Failure to thrive in adult 10/05/2020   Goals of care, counseling/discussion 10/12/2020   Hyperlipidemia associated with type 2 diabetes mellitus ([place]) 10/23/2016   Hypertension    Iron deficiency anemia 10/05/2020   Malignant ascites 10/05/2020   N&V (nausea and vomiting) 10/05/2020   Ovarian cancer on right ([place]) 10/05/2020   Palliative care by specialist 10/21/2020   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   Protein-calorie malnutrition, moderate ([place]) 10/05/2020   Psoriasis    PSORIASIS 03/28/2007   Qualifier: Diagnosis of  By: Jerold Coombe     Shock circulatory ([place]) 10/22/2020   Type 2 diabetes mellitus with hyperglycemia ([place]) 09/10/2020   Past Surgical History:  Past Surgical History:  Procedure Laterality Date   BIOPSY  10/08/2020   Procedure: BIOPSY;  Surgeon: Jackquline Denmark, MD;  Location: [facility];  Service: Endoscopy;;   ESOPHAGOGASTRODUODENOSCOPY (EGD) WITH PROPOFOL N/A 10/08/2020   Procedure: ESOPHAGOGASTRODUODENOSCOPY (EGD) WITH PROPOFOL;  Surgeon: Jackquline Denmark, MD;  Location: [facility];  Service: Endoscopy;  Laterality: N/A;   IR IMAGE GUIDED DRAINAGE PERCUT CATH  PERITONEAL RETROPERIT  10/23/2020   IR IMAGING GUIDED PORT INSERTION  10/13/2020   HPI:  Patient is a 65 yo female admitted with ascites, pain, hypotension, peritoneal cancer. Hx of DM, obesity,  cirrhosis, COVID. Pt transfered to ICU due to Shock, presumed septic (likely source LLL PNA) with component of chronic hypotension. Pt continues to have N/V. EGD on 10/08/20 revealed small hiatal hernia. Most recent abdominal CT showed: "gaseous distention of the small bowel, without evidence of  high-grade obstruction; this could reflect ileus."   Assessment / Plan / Recommendation Clinical Impression  Per this assessment as well as chart review, patient appears with primary esophageal dysphagia however pharyngeal dysphagia cannot be ruled out. Patient has been experiencing changing and unpleasant taste of most drinks but she knows she needs to try to get some fluids in her body. She also stated that if she isnt able to swallow pills in a timely manner, she will then become nauseated. She took a couple small sips of liquids (SLP had brought an orange flavored drink and a fruit flavored water) and exhibited delayed, dry, non productive cough. Difficult to determine if cough related to liquid PO's. Patient declined any solids. SLP plans to follow patient briefly to ensure toleration of diet and determine if any further education regarding dysphagia is needed. SLP Visit Diagnosis: Dysphagia, unspecified (R13.10)    Aspiration Risk  Mild aspiration risk    Diet Recommendation Regular;Thin liquid   Liquid Administration via: Cup;Straw Medication Administration: Whole meds with liquid Supervision: Patient able to self feed Postural Changes: Seated upright at 90 degrees;Remain upright for at least 30 minutes after po intake    Other  Recommendations Oral Care Recommendations: Oral care BID;Patient independent with oral care   Follow up Recommendations None      Frequency and Duration min 1 x/week  1 week       Prognosis Prognosis for Safe Diet Advancement: Good      Swallow Study   General Date of Onset: 11/02/20 HPI: Patient is a 65 yo female admitted with ascites, pain, hypotension,  peritoneal cancer. Hx of DM, obesity, cirrhosis, COVID. Pt transfered to ICU due to Shock, presumed septic (likely source LLL PNA) with component of chronic hypotension. Pt continues to have N/V. EGD on 10/08/20 revealed small hiatal hernia. Most recent abdominal CT showed: "gaseous distention of the small bowel, without evidence of  high-grade obstruction; this could reflect ileus." Type of Study: Bedside Swallow Evaluation Previous Swallow Assessment: None found Diet Prior to this Study: Regular;Thin liquids Temperature Spikes Noted: No Respiratory Status: Room air History of Recent Intubation: No Behavior/Cognition: Alert;Pleasant mood;Cooperative Oral Cavity Assessment: Within Functional Limits Oral Care Completed by SLP: No Oral Cavity - Dentition: Adequate natural dentition Vision: Functional for self-feeding Self-Feeding Abilities: Able to feed self Patient Positioning: Upright in bed Baseline Vocal Quality: Normal Volitional Cough: Strong Volitional Swallow: Able to elicit    Oral/Motor/Sensory Function Overall Oral Motor/Sensory Function: Within functional limits   Ice Chips     Thin Liquid Thin Liquid: Impaired Presentation: Cup;Self Fed Pharyngeal  Phase Impairments: Cough - Delayed Other Comments: A couple instances of delayed, dry, non productive cough. Difficult to determine if related to swallow.    Nectar Thick     Honey Thick     Puree Puree: Not tested   Solid     Solid: Not tested     Sonia Baller, MA, CCC-SLP Speech Therapy

## 2020-11-02 NOTE — Progress Notes (Signed)
Daily Progress Note   Patient Name: Beth Sanchez       Date: 11/02/2020 DOB: 12-10-1955  Age: 65 y.o. MRN#: [id number] Attending Physician: Hosie Poisson, MD Primary Care Physician: Pcp, No Admit Date: 10/05/2020  Reason for Consultation/Follow-up: Establishing goals of care  Subjective: I met today with Beth Sanchez.  She had two close friends at the bedside who were visiting as well.  She reports that she thinks her appetite is slowly improving.  She reports being more hopeful and states that she hopes he is able to get more chemotherapy.  Her ultimate goal would be to be well enough to go on a cruise that she and her sister had been planning.  We also discussed pain management.  She expressed being "afraid" of the medication for pain as she does not want to come addicted.  We discussed utilization of opioids in pain management of cancer pain and the goal of increasing her functional status with good pain control.  She expressed understanding gauge if more pain medication does enable her to be more active.  Length of Stay: 28  Current Medications: Scheduled Meds:   fentaNYL  1 patch Transdermal Q72H   fludrocortisone  0.4 mg Oral BID   lidocaine  15 mL Oral Once   mouth rinse  15 mL Mouth Rinse BID   midodrine  20 mg Oral TID WC   [START ON 11/03/2020] pantoprazole (PROTONIX) IV  40 mg Intravenous Daily   Ensure Max Protein  11 oz Oral BID   sodium chloride flush  10-40 mL Intracatheter Q12H    Continuous Infusions:  sodium chloride     sodium chloride Stopped (10/29/20 0950)   promethazine (PHENERGAN) injection (IM or IVPB) 12.5 mg (11/02/20 1734)    PRN Meds: sodium chloride, acetaminophen (TYLENOL) oral liquid 160 mg/5 mL **OR** acetaminophen, guaiFENesin-dextromethorphan,  HYDROmorphone (DILAUDID) injection, prochlorperazine, sodium chloride flush  Physical Exam         Constitutional:      General: She is not in acute distress. HENT:     Head: Normocephalic and atraumatic.  Cardiovascular:     Rate and Rhythm: Normal rate.  Pulmonary:     Effort: Pulmonary effort is normal.  Skin:    General: Skin is warm and dry.   Vital Signs: BP 92/68 (BP Location: Right  Arm)   Pulse 83   Temp 97.6 F (36.4 C) (Oral)   Resp 16   Ht 5' 7" (1.702 m)   Wt 81.2 kg   SpO2 97%   BMI 28.04 kg/m  SpO2: SpO2: 97 % O2 Device: O2 Device: Room Air O2 Flow Rate: O2 Flow Rate (L/min): 0 L/min  Intake/output summary:  Intake/Output Summary (Last 24 hours) at 11/02/2020 2220 Last data filed at 11/02/2020 1800 Gross per 24 hour  Intake 800 ml  Output 300 ml  Net 500 ml    LBM: Last BM Date: 11/01/20 Baseline Weight: Weight: 87.7 kg Most recent weight: Weight: 81.2 kg       Palliative Assessment/Data:      Patient Active Problem List   Diagnosis Date Noted   DNR (do not resuscitate) 10/22/2020   Comfort measures only status 10/22/2020   Shock circulatory ([place]) 10/22/2020   Acute hypoxemic respiratory failure due to COVID-19 ([place]) 10/22/2020   Palliative care by specialist 10/21/2020   Goals of care, counseling/discussion 10/12/2020   Gastritis 10/08/2020   Hyperkalemia    Hyponatremia    Cirrhosis of liver ([place]) 10/05/2020   Malignant ascites 10/05/2020   Iron deficiency anemia 10/05/2020   AKI (acute kidney injury) ([place]) 10/05/2020   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   N&V (nausea and vomiting) 10/05/2020   COVID-19 virus infection 10/05/2020   Failure to thrive in adult 10/05/2020   Protein-calorie malnutrition, moderate ([place]) 10/05/2020   Ovarian cancer on right ([place]) 10/05/2020   Cancer associated pain 10/05/2020   Dyspepsia 09/10/2020   Type 2 diabetes mellitus with hyperglycemia ([place]) 09/10/2020   SOB (shortness of breath)  09/10/2020   Hyperlipidemia associated with type 2 diabetes mellitus ([place]) 10/23/2016   Osteoarthritis of both knees 07/08/2014   URTICARIA 01/09/2009   GASTROENTERITIS 11/03/2008   Essential hypertension 03/28/2007   PSORIASIS 03/28/2007    Palliative Care Assessment & Plan   Patient Profile: 65 y.o. female  with past medical history of HTN, DMII, HLD, and obesity  admitted on 10/05/2020 with abdominal pain, distention, low back pain, emesis, and fatigue.  On admission, patient found to have liver cirrhosis with ascites, AKI, hypotension, mild hyponatermia, and COVID-19.   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 She is being followed by Dr. Martha Clan. She had 1 dose of carboplatinum without improvement. Chemotherapy is on hold given patient's hypotensive state. She is unable to tolerate PO intake without N/V.  She had a PleurX catheter placed in IR with 4.5L output.  Recommendations/Plan: DNR/DNI Continue current care.  She is very appreciative of Dr. Antonieta Pert guidance and input.  She expresses today she is hopeful to receive more chemotherapy. Pain: Cancer-related.  New current regimen.  Continue to monitor and consideration for increasing fentanyl patch with next patch change. Palliative will continue to follow peripherally.  Code Status:    Code Status Orders  (From admission, onward)           Start     Ordered   10/22/20 0718  Do not attempt resuscitation (DNR)  Continuous       Question Answer Comment  In the event of cardiac or respiratory ARREST Do not call a "code blue"   In the event of cardiac or respiratory ARREST Do not perform Intubation, CPR, defibrillation or ACLS   In the event of cardiac or respiratory ARREST Use medication by any route, position, wound care,  and other measures to relive pain and suffering. May use oxygen, suction and manual  treatment of airway obstruction as needed for comfort.      10/22/20 0717           Code Status History     Date Active Date Inactive Code Status Order ID Comments User Context   10/05/2020 1703 10/22/2020 0716 Full Code [id number]  Lequita Halt, MD ED       Prognosis: Guarded  Discharge Planning: To Be Determined  Care plan was discussed with patient, sister, bedside care team  Thank you for allowing the Palliative Medicine Team to assist in the care of this patient.   Total Time 25 Prolonged Time Billed No  Greater than 50%  of this time was spent counseling and coordinating care related to the above assessment and plan.   Micheline Rough, MD  Please contact Palliative Medicine Team phone at [phone] for questions and concerns.

## 2020-11-02 NOTE — Progress Notes (Signed)
Beth Sanchez did okay yesterday from what I can tell.  I not see any evidence that she threw up.  There is nothing in the nurses notes about her getting sick.  She seemed to eat little bit more.  She still has bowel sounds.  Her labs show white cell count of 4000.  Hemoglobin 10.3.  Platelet count 92,000.  Her BUN is 32 creatinine 0.96.  Calcium is 8.1 with an albumin of 2.2.  I am still awaiting the CA-125.  We will have to see what this level is.  I think she is going to need physical therapy.  She is quite deconditioned.  I will know she got up in a chair yesterday.  She is not having any obvious pain.  There is no bleeding.  There is no cough or shortness of breath.  She has had no fever.  Her vital signs show temperature 97.6.  Pulse 86.  Blood pressure 93/64.  I will increase the Florinef a little bit.  There really is no change in her physical exam.  Again, I think she is improving.  She is not throwing up.  She is having some diarrhea.  Her bowels are working again.  If we now just get more nutrition into her.  We actually might think about another round of treatment this week.  This time, I will add Avastin to the protocol.  I know this is cautiously optimistic.  However, I does have a sense that she has finally responded to what we gave her a couple weeks ago.  Again her bowels are working.  This I think is was critical.  Again the CA-125 is known to be very helpful.  I know that this is incredibly complicated.  I really appreciate everybody's help.  I just want to try to make life as good as possible for Beth Sanchez.  I think we can treat her malignancy, this will definitely improve her quality of life and obviously quantity of life.  Lattie Haw, MD  Proverbs 17:17

## 2020-11-03 DIAGNOSIS — C482 Malignant neoplasm of peritoneum, unspecified: Secondary | ICD-10-CM | POA: Diagnosis not present

## 2020-11-03 DIAGNOSIS — R188 Other ascites: Secondary | ICD-10-CM | POA: Diagnosis not present

## 2020-11-03 DIAGNOSIS — N179 Acute kidney failure, unspecified: Secondary | ICD-10-CM | POA: Diagnosis not present

## 2020-11-03 DIAGNOSIS — U071 COVID-19: Secondary | ICD-10-CM | POA: Diagnosis not present

## 2020-11-03 LAB — COMPREHENSIVE METABOLIC PANEL
ALT: 11 U/L (ref 0–44)
AST: 14 U/L — ABNORMAL LOW (ref 15–41)
Albumin: 2.2 g/dL — ABNORMAL LOW (ref 3.5–5.0)
Alkaline Phosphatase: 144 U/L — ABNORMAL HIGH (ref 38–126)
Anion gap: 7 (ref 5–15)
BUN: 30 mg/dL — ABNORMAL HIGH (ref 8–23)
CO2: 20 mmol/L — ABNORMAL LOW (ref 22–32)
Calcium: 7.8 mg/dL — ABNORMAL LOW (ref 8.9–10.3)
Chloride: 107 mmol/L (ref 98–111)
Creatinine, Ser: 0.88 mg/dL (ref 0.44–1.00)
GFR, Estimated: >60 mL/min (ref >60)
Glucose, Bld: 75 mg/dL (ref 70–99)
Potassium: 3.7 mmol/L (ref 3.5–5.1)
Sodium: 134 mmol/L — ABNORMAL LOW (ref 135–145)
Total Bilirubin: 0.9 mg/dL (ref 0.3–1.2)
Total Protein: 5.5 g/dL — ABNORMAL LOW (ref 6.5–8.1)

## 2020-11-03 LAB — CBC
HCT: 32 % — ABNORMAL LOW (ref 36.0–46.0)
Hemoglobin: 10.1 g/dL — ABNORMAL LOW (ref 12.0–15.0)
MCH: 26.8 pg (ref 26.0–34.0)
MCHC: 31.6 g/dL (ref 30.0–36.0)
MCV: 84.9 fL (ref 80.0–100.0)
Platelets: 94 10*3/uL — ABNORMAL LOW (ref 150–400)
RBC: 3.77 MIL/uL — ABNORMAL LOW (ref 3.87–5.11)
RDW: 18.3 % — ABNORMAL HIGH (ref 11.5–15.5)
WBC: 3.9 10*3/uL — ABNORMAL LOW (ref 4.0–10.5)
nRBC: 0 % (ref 0.0–0.2)

## 2020-11-03 MED ORDER — LIP MEDEX EX OINT
TOPICAL_OINTMENT | CUTANEOUS | Status: AC
  Administered 2020-11-03: 1
  Filled 2020-11-03: qty 7

## 2020-11-03 MED ORDER — LIP MEDEX EX OINT
TOPICAL_OINTMENT | Freq: Once | CUTANEOUS | Status: DC
Start: 2020-11-03 — End: 2020-11-09
  Filled 2020-11-03: qty 7

## 2020-11-03 NOTE — Progress Notes (Signed)
PROGRESS NOTE    Beth Sanchez  [id number] DOB: [birth year] DOA: 10/05/2020 PCP: Pcp, No    Chief Complaint  Patient presents with   Abdominal Pain    Vomiting, diarrhea    Brief Narrative:   65 year old woman with prior history of hypertension, type 2 diabetes, cirrhosis with peritoneal carcinomatosis presents to ED with back pain and abdominal distention associated with nausea and vomiting. She was diagnosed with COVID 5 in August at which time she was found to have new cirrhosis, ascites and ultimately diagnosed with, peritoneal carcinomatosis.  She underwent multiple paracentesis, and 1 dose of chemotherapy.  Unfortunately patient could not tolerate further chemotherapy and was admitted for the above, developed hypotension requiring transfer to ICU for pressor support.  After further discussions with PCCM and oncology, patient and her sister transition to DNR. Hospital course complicated by hypotension, requiring midodrine and florinef. Dr Marin Olp wants to transfer the patient to 6 E for another round of chemotherapy.   Assessment & Plan:   Principal Problem:   Primary peritoneal carcinomatosis ([place]) Active Problems:   Essential hypertension   Hyperlipidemia associated with type 2 diabetes mellitus ([place])   Type 2 diabetes mellitus with hyperglycemia (HCC)   Cirrhosis of liver (HCC)   Malignant ascites   Iron deficiency anemia   AKI (acute kidney injury) ([place])   Hyperkalemia   Hyponatremia   Gastritis   Goals of care, counseling/discussion   Palliative care by specialist   N&V (nausea and vomiting)   DNR (do not resuscitate)   Comfort measures only status   Shock circulatory ([place])   COVID-19 virus infection   Acute hypoxemic respiratory failure due to COVID-19 ([place])   Failure to thrive in adult   Protein-calorie malnutrition, moderate (HCC)   Ovarian cancer on right ([place])   Cancer associated pain   Hypovolemic shock presumed to be septic likely source from left  lower lobe pneumonia with a component of chronic hypotension Required pressors briefly in ICU, currently off pressors . BP parameters have been borderline low.  Completed the course of antibiotics for the pneumonia.    Intractable nausea and vomiting probably secondary to carcinomatosis Symptomatic management with anti emetics.  Patient slightly nauseated this morning but no vomiting. Pt requesting to change to IV or liquid form.   AKI in the setting of hypovolemic shock Labs revealed improvement in the creatinine.  Creatinine appears to be back to baseline at 0.88. Continue to follow labs as per oncology.    Acute hypoxic respiratory failure on 10/22/2020 Probably secondary to combination of COVID and left lower lobe pneumonia.  Completed the course of antibiotics Nasal cannula oxygen to keep sats greater than 88%. Patient is currently on room air with good oxygen sats.   Liver cirrhosis diagnosed last month with malignant ascites.  Symptom management with peritoneal drainage as needed.  Patient has a catheter in place   Pancytopenia probably secondary to chemotherapy Hemoglobin stable around 10.    Peritoneal carcinomatosis with malignant ascites ?  Right-sided ovarian cancer Oncology and palliative on board Not a candidate for any kind of chemotherapy at this time as per last week, Dr. Marin Olp feels she could tolerate another cycle of chemo and she is being transferred to 6 E. Prognosis remains very poor,.  She has a peritoneal drain placed on 10/23/2020.  She was started on fentanyl patch for pain, weaned her off the PCA pump,  increase the fentanyl patch dose as required. She is good to be discharged to [facility]  if family wants to pursue aggressive management.     Anemia of chronic illness No signs of active bleeding at this time. Hemoglobin stable around 10   Type 2 diabetes Non-insulin-dependent holding all oral medications due to poor intake.     Failure to thrive  present on admission probably secondary to metastatic cancer.   Moderate protein calorie malnutrition present on admission Dietary on board   Mild hyponatremia Continue to monitor    DVT prophylaxis: scd's code Status: (DNR) Family Communication: None at bedside Disposition:   Status is: Inpatient  Remains inpatient appropriate because:Unsafe d/c plan  Dispo: The patient is from: Home              Anticipated d/c is to:  pending  family to decide .               Patient currently is medically stable to d/c.   Difficult to place patient No       Consultants:  Oncology PCCM  Procedures: (None)  Antimicrobials: none   Subjective: No vomiting, slightly nauseated Objective: Vitals:   11/02/20 2200 11/03/20 0507 11/03/20 0853 11/03/20 0854  BP: 92/68 (!) 87/74 (!) 70/63 101/82  Pulse: 83 82    Resp: '16 16 18   '$ Temp: 97.6 F (36.4 C) (!) 97.5 F (36.4 C)    TempSrc: Oral Oral    SpO2: 97% 98%    Weight:      Height:        Intake/Output Summary (Last 24 hours) at 11/03/2020 0935 Last data filed at 11/03/2020 0754 Gross per 24 hour  Intake 790 ml  Output 300 ml  Net 490 ml    Filed Weights   10/06/20 0000 10/08/20 1351 10/29/20 0500  Weight: 87.7 kg 87.7 kg 81.2 kg     Examination General exam: Elderly woman, deconditioned, ill-appearing on room air and appears comfortable Respiratory system: Clear to auscultation. Respiratory effort normal. Cardiovascular system: S1 & S2 heard, RRR. No JVD, murmurs,  Gastrointestinal system: Abdomen is soft, nontender mildly distended, bowel sounds normal Central nervous system: Alert and oriented. No focal neurological deficits. Extremities: Symmetric 5 x 5 power. Skin: No rashes, lesions or ulcers Psychiatry: Mood & affect appropriate.        Data Reviewed: I have personally reviewed following labs and imaging studies  CBC: Recent Labs  Lab 10/30/20 1816 11/01/20 0321 11/01/20 1346 11/02/20 0320  11/03/20 0219  WBC 1.3* 3.0*  --  4.0 3.9*  HGB 10.6* 9.9* 10.4* 10.3* 10.1*  HCT 33.3* 30.8* 32.6* 32.2* 32.0*  MCV 83.5 83.5  --  84.3 84.9  PLT 108* 97*  --  92* 94*     Basic Metabolic Panel: Recent Labs  Lab 10/30/20 1816 11/02/20 0320 11/03/20 0219  NA 135 136 134*  K 4.5 3.8 3.7  CL 109 107 107  CO2 17* 19* 20*  GLUCOSE 65* 73 75  BUN 33* 32* 30*  CREATININE 1.10* 0.96 0.88  CALCIUM 8.3* 8.1* 7.8*     GFR: Estimated Creatinine Clearance: 69.8 mL/min (by C-G formula based on SCr of 0.88 mg/dL).  Liver Function Tests: Recent Labs  Lab 10/30/20 1816 11/02/20 0320 11/03/20 0219  AST 17 14* 14*  ALT '12 12 11  '$ ALKPHOS 140* 138* 144*  BILITOT 1.0 1.1 0.9  PROT 5.6* 5.5* 5.5*  ALBUMIN 2.3* 2.2* 2.2*     CBG: Recent Labs  Lab 10/28/20 2350 10/29/20 0508 10/29/20 0834 10/29/20 1220 10/29/20 1656  GLUCAP 91  86 81 75 71      No results found for this or any previous visit (from the past 240 hour(s)).         Radiology Studies: No results found.      Scheduled Meds:  fentaNYL  1 patch Transdermal Q72H   fludrocortisone  0.4 mg Oral BID   lidocaine  15 mL Oral Once   lip balm   Topical Once   mouth rinse  15 mL Mouth Rinse BID   midodrine  20 mg Oral TID WC   pantoprazole (PROTONIX) IV  40 mg Intravenous Daily   Ensure Max Protein  11 oz Oral BID   sodium chloride flush  10-40 mL Intracatheter Q12H   Continuous Infusions:  sodium chloride     sodium chloride Stopped (10/29/20 0950)   promethazine (PHENERGAN) injection (IM or IVPB) 12.5 mg (11/03/20 0345)     LOS: 29 days        Hosie Poisson, MD [facility]   To contact the attending provider between 7A-7P or the covering provider during after hours 7P-7A, please log into the web site [email or web address] and access using universal  password for that web site. If you do not have the password, please call the hospital operator.  11/03/2020, 9:35 AM

## 2020-11-03 NOTE — Progress Notes (Signed)
Surprisingly, she is responding to treatment.  Her CA 125 is now down to 4700.  This is after 1 treatment and she did not even receive full treatment.  I really think that we have to give her a chance and go with a second treatment.  This time, I would use carboplatinum along with Avastin.  I think she could tolerate the 2.  I am not sure if she could tolerate Taxol along with carboplatinum and Avastin.  The Avastin should help with her blood pressure.  Again she is eating.  She is not throwing up from what I can tell.  I am just amazed as to her resilience.  Her labs show white cell count 3.9.  Hemoglobin 10.1.  Platelet count 94,000.  Her BUN is 30 creatinine 0.88.  Her albumin is 2.2.  She does not complain of any pain.  She is having no bleeding.  She is having some flatus.  I think physical therapy stop by to see her.  I suspect that she is going to end up having to go to a rehab facility.  There is no fever.  She has had no cough or shortness of breath.  Her blood pressure still little bit of an issue.  Her temperature is 97.5.  Pulse 82.  Blood pressure 87/74.  I know that she is on medicine to try to get her blood pressure up.  I increase the Florinef.  Hopefully, with her increased oral intake, she will improve her blood pressure.    I will see if we can try to move her up to 6 E.  I will try treatment up there.  Again, I am amazed as to how well she is actually doing.  I am very impressed with her toughness.  It would be nice to get her blood pressure better.    Lattie Haw, MD  Romans 8:28

## 2020-11-04 ENCOUNTER — Inpatient Hospital Stay (HOSPITAL_COMMUNITY): Payer: Medicare Other

## 2020-11-04 DIAGNOSIS — M7989 Other specified soft tissue disorders: Secondary | ICD-10-CM

## 2020-11-04 DIAGNOSIS — C482 Malignant neoplasm of peritoneum, unspecified: Secondary | ICD-10-CM | POA: Diagnosis not present

## 2020-11-04 DIAGNOSIS — L8990 Pressure ulcer of unspecified site, unspecified stage: Secondary | ICD-10-CM | POA: Insufficient documentation

## 2020-11-04 LAB — CBC
HCT: 33.3 % — ABNORMAL LOW (ref 36.0–46.0)
Hemoglobin: 10.7 g/dL — ABNORMAL LOW (ref 12.0–15.0)
MCH: 27.1 pg (ref 26.0–34.0)
MCHC: 32.1 g/dL (ref 30.0–36.0)
MCV: 84.3 fL (ref 80.0–100.0)
Platelets: 111 10*3/uL — ABNORMAL LOW (ref 150–400)
RBC: 3.95 MIL/uL (ref 3.87–5.11)
RDW: 18.3 % — ABNORMAL HIGH (ref 11.5–15.5)
WBC: 3.1 10*3/uL — ABNORMAL LOW (ref 4.0–10.5)
nRBC: 0 % (ref 0.0–0.2)

## 2020-11-04 LAB — COMPREHENSIVE METABOLIC PANEL
ALT: 11 U/L (ref 0–44)
AST: 16 U/L (ref 15–41)
Albumin: 2.3 g/dL — ABNORMAL LOW (ref 3.5–5.0)
Alkaline Phosphatase: 160 U/L — ABNORMAL HIGH (ref 38–126)
Anion gap: 7 (ref 5–15)
BUN: 26 mg/dL — ABNORMAL HIGH (ref 8–23)
CO2: 20 mmol/L — ABNORMAL LOW (ref 22–32)
Calcium: 8.2 mg/dL — ABNORMAL LOW (ref 8.9–10.3)
Chloride: 113 mmol/L — ABNORMAL HIGH (ref 98–111)
Creatinine, Ser: 0.98 mg/dL (ref 0.44–1.00)
GFR, Estimated: >60 mL/min (ref >60)
Glucose, Bld: 79 mg/dL (ref 70–99)
Potassium: 3.7 mmol/L (ref 3.5–5.1)
Sodium: 140 mmol/L (ref 135–145)
Total Bilirubin: 0.8 mg/dL (ref 0.3–1.2)
Total Protein: 5.5 g/dL — ABNORMAL LOW (ref 6.5–8.1)

## 2020-11-04 LAB — HEPARIN LEVEL (UNFRACTIONATED): Heparin Unfractionated: 0.38 IU/mL (ref 0.30–0.70)

## 2020-11-04 MED ORDER — FAMOTIDINE IN NACL 20-0.9 MG/50ML-% IV SOLN
20.0000 mg | Freq: Once | INTRAVENOUS | Status: AC
  Administered 2020-11-04: 20 mg via INTRAVENOUS
  Filled 2020-11-04: qty 50

## 2020-11-04 MED ORDER — DIPHENHYDRAMINE HCL 50 MG/ML IJ SOLN
50.0000 mg | Freq: Once | INTRAMUSCULAR | Status: AC
  Administered 2020-11-04: 50 mg via INTRAVENOUS
  Filled 2020-11-04: qty 1

## 2020-11-04 MED ORDER — COLD PACK MISC ONCOLOGY: 1.0000 | Freq: Once | Status: DC | PRN

## 2020-11-04 MED ORDER — SODIUM CHLORIDE 0.9 % IV SOLN: Freq: Once | INTRAVENOUS | Status: AC

## 2020-11-04 MED ORDER — SODIUM CHLORIDE 0.9 % IV SOLN
150.0000 mg | Freq: Once | INTRAVENOUS | Status: AC
  Administered 2020-11-04: 150 mg via INTRAVENOUS
  Filled 2020-11-04: qty 5

## 2020-11-04 MED ORDER — SODIUM CHLORIDE 0.9 % IV SOLN
10.0000 mg | Freq: Once | INTRAVENOUS | Status: AC
  Administered 2020-11-04: 10 mg via INTRAVENOUS
  Filled 2020-11-04: qty 1

## 2020-11-04 MED ORDER — PALONOSETRON HCL INJECTION 0.25 MG/5ML
0.2500 mg | Freq: Once | INTRAVENOUS | Status: AC
  Administered 2020-11-04: 0.25 mg via INTRAVENOUS
  Filled 2020-11-04: qty 5

## 2020-11-04 MED ORDER — SODIUM CHLORIDE 0.9 % IV SOLN
470.0000 mg | Freq: Once | INTRAVENOUS | Status: AC
  Administered 2020-11-04: 470 mg via INTRAVENOUS
  Filled 2020-11-04: qty 47

## 2020-11-04 MED ORDER — HEPARIN (PORCINE) 25000 UT/250ML-% IV SOLN
1200.0000 [IU]/h | INTRAVENOUS | Status: DC
  Administered 2020-11-04 – 2020-11-05 (×2): 1200 [IU]/h via INTRAVENOUS
  Filled 2020-11-04 (×2): qty 250

## 2020-11-04 MED ORDER — HEPARIN BOLUS VIA INFUSION
2000.0000 [IU] | Freq: Once | INTRAVENOUS | Status: AC
  Administered 2020-11-04: 2000 [IU] via INTRAVENOUS
  Filled 2020-11-04: qty 2000

## 2020-11-04 NOTE — NC FL2 (Signed)
[place] LEVEL OF CARE SCREENING TOOL     IDENTIFICATION  Patient Name: Beth Sanchez Birthdate: 10-01-1955 Sex: female Admission Date (Current Location): 10/05/2020  [facility] and [place] Number:  Herbalist and Address:  [facility],  [place] [address] [place]      Provider Number: [id number]  Attending Physician Name and Address:  Caren Griffins, MD  Relative Name and Phone Number:       Current Level of Care: Hospital Recommended Level of Care: [place] Prior Approval Number:    Date Approved/Denied:   PASRR Number: [id number]  Discharge Plan: SNF    Current Diagnoses: Patient Active Problem List   Diagnosis Date Noted   Pressure injury of skin 11/04/2020   DNR (do not resuscitate) 10/22/2020   Comfort measures only status 10/22/2020   Shock circulatory ([place]) 10/22/2020   Acute hypoxemic respiratory failure due to COVID-19 ([place]) 10/22/2020   Palliative care by specialist 10/21/2020   Goals of care, counseling/discussion 10/12/2020   Gastritis 10/08/2020   Hyperkalemia    Hyponatremia    Cirrhosis of liver ([place]) 10/05/2020   Malignant ascites 10/05/2020   Iron deficiency anemia 10/05/2020   AKI (acute kidney injury) ([place]) 10/05/2020   Primary peritoneal carcinomatosis ([place]) 10/05/2020   N&V (nausea and vomiting) 10/05/2020   COVID-19 virus infection 10/05/2020   Failure to thrive in adult 10/05/2020   Protein-calorie malnutrition, moderate ([place]) 10/05/2020   Ovarian cancer on right ([place]) 10/05/2020   Cancer associated pain 10/05/2020   Dyspepsia 09/10/2020   Type 2 diabetes mellitus with hyperglycemia ([place]) 09/10/2020   SOB (shortness of breath) 09/10/2020   Hyperlipidemia associated with type 2 diabetes mellitus ([place]) 10/23/2016   Osteoarthritis of both knees 07/08/2014   URTICARIA 01/09/2009   GASTROENTERITIS 11/03/2008   Essential hypertension 03/28/2007   PSORIASIS 03/28/2007     Orientation RESPIRATION BLADDER Height & Weight     Self, Time, Situation, Place  Normal Continent Weight: 77.7 kg Height:  '5\' 7"'$  (170.2 cm)  BEHAVIORAL SYMPTOMS/MOOD NEUROLOGICAL BOWEL NUTRITION STATUS      Continent Diet (Regular)  AMBULATORY STATUS COMMUNICATION OF NEEDS Skin   Extensive Assist Verbally Skin abrasions (face, bilateral hands and arms.)                       Personal Care Assistance Level of Assistance  Bathing, Feeding, Dressing Bathing Assistance: Limited assistance Feeding assistance: Independent Dressing Assistance: Limited assistance     Functional Limitations Info  Sight, Hearing, Speech Sight Info: Adequate Hearing Info: Adequate Speech Info: Adequate    SPECIAL CARE FACTORS FREQUENCY  PT (By licensed PT), OT (By licensed OT)     PT Frequency: 5 x weekly OT Frequency: 5 x weekly            Contractures      Additional Factors Info  Code Status, Allergies Code Status Info: DNR Allergies Info: None known.           Current Medications (11/04/2020):  This is the current hospital active medication list Current Facility-Administered Medications  Medication Dose Route Frequency Provider Last Rate Last Admin   0.9 %  sodium chloride infusion   Intravenous Once Hosie Poisson, MD       0.9 %  sodium chloride infusion   Intravenous PRN Hosie Poisson, MD   Stopped at 10/29/20 0950   acetaminophen (TYLENOL) 160 MG/5ML solution 650 mg  650 mg Oral Q6H PRN Karleen Hampshire,  Jeoffrey Massed, MD       Or   acetaminophen (TYLENOL) suppository 650 mg  650 mg Rectal Q6H PRN Hosie Poisson, MD       fentaNYL (DURAGESIC) 12 MCG/HR 1 patch  1 patch Transdermal Q72H Hosie Poisson, MD   1 patch at 11/04/20 0745   fludrocortisone (FLORINEF) tablet 0.4 mg  0.4 mg Oral BID Hosie Poisson, MD   0.4 mg at 11/04/20 0759   guaiFENesin-dextromethorphan (ROBITUSSIN DM) 100-10 MG/5ML syrup 5 mL  5 mL Oral Q4H PRN Hosie Poisson, MD   5 mL at 11/01/20 2014   heparin ADULT infusion  100 units/mL (25000 units/240m)  1,200 Units/hr Intravenous Continuous SLenis Noon RPH 12 mL/hr at 11/04/20 1137 1,200 Units/hr at 11/04/20 1137   HYDROmorphone (DILAUDID) injection 1 mg  1 mg Intravenous Q3H PRN AHosie Poisson MD   1 mg at 11/04/20 0122   lidocaine (XYLOCAINE) 2 % viscous mouth solution 15 mL  15 mL Oral Once AHosie Poisson MD       lip balm (CARMEX) ointment   Topical Once AHosie Poisson MD       MEDLINE mouth rinse  15 mL Mouth Rinse BID AHosie Poisson MD   15 mL at 11/04/20 0800   midodrine (PROAMATINE) tablet 20 mg  20 mg Oral TID WC AHosie Poisson MD   20 mg at 11/04/20 0740   pantoprazole (PROTONIX) injection 40 mg  40 mg Intravenous Daily AHosie Poisson MD   40 mg at 11/04/20 1034   prochlorperazine (COMPAZINE) injection 10 mg  10 mg Intravenous Q6H PRN AHosie Poisson MD   10 mg at 11/02/20 0748   promethazine (PHENERGAN) 12.5 mg in sodium chloride 0.9 % 50 mL IVPB  12.5 mg Intravenous Q6H AHosie Poisson MD 200 mL/hr at 11/04/20 0808 12.5 mg at 11/04/20 [id number]  protein supplement (ENSURE MAX) liquid  11 oz Oral BID AHosie Poisson MD       sodium chloride flush (NS) 0.9 % injection 10-40 mL  10-40 mL Intracatheter Q12H AHosie Poisson MD   10 mL at 11/03/20 2134   sodium chloride flush (NS) 0.9 % injection 10-40 mL  10-40 mL Intracatheter PRN AHosie Poisson MD         Discharge Medications: Please see discharge summary for a list of discharge medications.  Relevant Imaging Results:  Relevant Lab Results:   Additional Information SS# [id number]  Covid vaccinated x 4.  Pt has Right sided PleurX catheter placed in peritoneal cavity  Lecil Tapp, NMarjie Skiff RN

## 2020-11-04 NOTE — Progress Notes (Signed)
OT Cancellation Note  Patient Details Name: Beth Sanchez MRN: [id number] DOB: [birth year]   Cancelled Treatment:    Reason Eval/Treat Not Completed: Medical issues which prohibited therapy Per pharmacy note this AM, patient has DVT in LLE with initiation of Heparin drip. Will hold today and  continue to follow.   Jackelyn Poling OTR/L, [place] [facility] Department Office# [phone] Pager# [phone] 11/04/2020, 2:30 PM

## 2020-11-04 NOTE — Progress Notes (Addendum)
Proceed with Carboplatin '470mg'$  (AUC 5) today per MD. Discontinue Avastin order from treatment. MD will monitor and evaluate the need for Granix and add orders as needed. Will continue same premedications as received with cycle 1. Carbo only today.  Raul Del Blodgett, [place], BCPS, BCOP 11/04/2020 10:51 AM

## 2020-11-04 NOTE — Progress Notes (Signed)
Bilateral lower extremity venous duplex has been completed. Preliminary results can be found in CV Proc through chart review.  Results were given to the patient's nurse, [facility].  11/04/20 8:36 AM Carlos Levering RVT

## 2020-11-04 NOTE — Progress Notes (Signed)
ANTICOAGULATION CONSULT NOTE - Initial Consult  Pharmacy Consult for heparin Indication: DVT  No Known Allergies  Patient Measurements: Height: '5\' 7"'$  (170.2 cm) Weight: 77.7 kg (171 lb 4.8 oz) IBW/kg (Calculated) : 61.6 Heparin Dosing Weight: 77 kg  Vital Signs: Temp: 97.4 F (36.3 C) (09/14 0557) Temp Source: Oral (09/14 0557) BP: 106/78 (09/14 0557) Pulse Rate: 90 (09/14 0557)  Labs: Recent Labs    11/02/20 0320 11/03/20 0219 11/04/20 0505  HGB 10.3* 10.1* 10.7*  HCT 32.2* 32.0* 33.3*  PLT 92* 94* 111*  CREATININE 0.96 0.88 0.98    Estimated Creatinine Clearance: 61.4 mL/min (by C-G formula based on SCr of 0.98 mg/dL).   Medical History: Past Medical History:  Diagnosis Date   AKI (acute kidney injury) ([place]) 10/05/2020   Cancer associated pain 10/05/2020   Cirrhosis of liver ([place]) 10/05/2020   Comfort measures only status 10/22/2020   COVID-19 virus infection 10/05/2020   DNR (do not resuscitate) 10/22/2020   Failure to thrive in adult 10/05/2020   Goals of care, counseling/discussion 10/12/2020   Hyperlipidemia associated with type 2 diabetes mellitus ([place]) 10/23/2016   Hypertension    Iron deficiency anemia 10/05/2020   Malignant ascites 10/05/2020   N&V (nausea and vomiting) 10/05/2020   Ovarian cancer on right ([place]) 10/05/2020   Palliative care by specialist 10/21/2020   Primary peritoneal carcinomatosis ([place]) 10/12/2020   Primary peritoneal carcinomatosis ([place]) 10/12/2020   Protein-calorie malnutrition, moderate ([place]) 10/05/2020   Psoriasis    PSORIASIS 03/28/2007   Qualifier: Diagnosis of  By: Jerold Coombe     Shock circulatory ([place]) 10/22/2020   Type 2 diabetes mellitus with hyperglycemia ([place]) 09/10/2020    Medications: Pt not on anticoagulants PTA.  Assessment: Pt is a 75 yoF admitted with abdominal pain. PMH significant for recently diagnosed peritoneal carcinomatosis with plans to receive chemotherapy today. Venous doppler positive for acute DVT involving the  left popliteal vein, left posterior tibial veins, and left peroneal veins. Pharmacy consulted to dose/monitor heparin drip.   Today, 11/04/20 CBC: Hgb (10.7) low but stable; Plt (111) low and trending up SCr WNL, CrCl ~60 mL/min  Goal of Therapy:  Heparin level 0.3-0.7 units/ml Monitor platelets by anticoagulation protocol: Yes   Plan:  Heparin bolus of 2000 units IV once Initiate heparin infusion at 1200 units/hr Check 6 hour HL. HL, CBC daily while on heparin drip Monitor for signs of bleeding Follow for eventual transition to PO anticoagulation  Lenis Noon, PharmD 11/04/2020,9:18 AM

## 2020-11-04 NOTE — Progress Notes (Signed)
Pharmacy: Re- heparin  Patient is a 65 y.o F with peritoneal carcinomatosis, currently on heparin drip for acute LLE DVT.  - first heparin level is therapeutic at 0.38 with drip infusing at 1200 units/hr - hgb 10.7, plts 111K  Goal of Therapy:  Heparin level 0.3-0.7 units/ml Monitor platelets by anticoagulation protocol: Yes  Plan: - continue heparin drip at 1200 units/hr - will check another heparin level at 0100 to ensure level is therapeutic before changing to daily monitoring - monitor for s/sx bleeding  Dia Sitter, PharmD, BCPS 11/04/2020 7:09 PM

## 2020-11-04 NOTE — TOC Progression Note (Signed)
Transition of Care [facility]) - Progression Note    Patient Details  Name: Beth Sanchez MRN: [id number] Date of Birth: Jul 04, 1955  [facility]) CM/SW Contact  Bridget Pierson, Marjie Skiff, RN Phone Number: 11/04/2020, 2:32 PM  Clinical Narrative:     Spoke with pt at bedside for dc planning. Pt placed sister [name] on speaker phone to be included in conversation. Short term SNF was discussed and both pt and sister agree that this would be the best dc plan for pt right now so she can get stronger. Permission given to fax out FL2 to area SNFs for bed offers. TOC will provide pt with SNF bed offers when available.  Expected Discharge Plan: [place] Barriers to Discharge: Continued Medical Work up  Expected Discharge Plan and Services Expected Discharge Plan: [place]   Discharge Planning Services: CM Consult   Living arrangements for the past 2 months: Single Family Home                     Readmission Risk Interventions Readmission Risk Prevention Plan 11/04/2020  Transportation Screening Complete  Medication Review [employer]) Complete  HRI or Home Care Consult Complete  SW Recovery Care/Counseling Consult Complete  Palliative Care Screening Complete  Skilled Nursing Facility Complete  Some recent data might be hidden

## 2020-11-04 NOTE — Progress Notes (Signed)
PROGRESS NOTE  Beth Sanchez [id number] DOB: 1955-08-27 DOA: 10/05/2020 PCP: Pcp, No   LOS: 30 days   Brief Narrative / Interim history: 65 year old with history of HTN, DM2, liver cirrhosis, peritoneal carcinomatosis comes into the ED with back pain, abdominal distention, nausea and vomiting.  She was diagnosed with COVID in August and at that time she was found to have cirrhosis, ascites ultimately diagnosed with peritoneal carcinomatosis.  She underwent multiple paracentesis in 1 cycle of chemotherapy.  She had difficulties tolerating her first cycle of chemo.  Hospital course complicated by presumed septic shock from left lower lobe pneumonia, briefly requiring pressors, on underlying chronic hypotension.  She was eventually transferred out of the ICU.  Oncology consulted currently getting a second round of chemotherapy  Subjective / 24h Interval events: She is doing well this morning, overall feeling well.  Mild abdominal pain, no nausea or vomiting.  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  Oncology and palliative care consulted.  She is to receive second round of chemotherapy today -Appreciate oncology follow-up -She has a peritoneal drain placed 9/2 -Continue pain control with fentanyl patch, Dilaudid as needed  Active Problems Septic shock from left lower lobe pneumonia, chronic hypotension-she was briefly in the ICU requiring pressors.  Currently completed antibiotic course.  She is on midodrine as well as fludrocortisone for chronic hypotension  Acute DVT-placed on heparin infusion, closely monitor for bleeding.  Transition to oral agent, will discuss with oncology  AKI in the setting of shock-creatinine improved  Acute hypoxic respiratory failure on 9/1-due to pneumonia, completed antibiotics, currently on room air  Liver cirrhosis, malignant ascites-monitor  Pancytopenia-due to  chemotherapy, continue to closely monitor  Scheduled Meds:  CARBOplatin  470 mg Intravenous Once   diphenhydrAMINE  50 mg Intravenous Once   fentaNYL  1 patch Transdermal Q72H   fludrocortisone  0.4 mg Oral BID   lidocaine  15 mL Oral Once   lip balm   Topical Once   mouth rinse  15 mL Mouth Rinse BID   midodrine  20 mg Oral TID WC   palonosetron  0.25 mg Intravenous Once   pantoprazole (PROTONIX) IV  40 mg Intravenous Daily   Ensure Max Protein  11 oz Oral BID   sodium chloride flush  10-40 mL Intracatheter Q12H   Continuous Infusions:  sodium chloride     sodium chloride Stopped (10/29/20 0950)   dexamethasone (DECADRON) IVPB ([facility]) 10 mg (11/04/20 1244)   fosaprepitant (EMEND) IV infusion 150 mg     heparin 1,200 Units/hr (11/04/20 1137)   promethazine (PHENERGAN) injection (IM or IVPB) 12.5 mg (11/04/20 0808)   PRN Meds:.sodium chloride, acetaminophen (TYLENOL) oral liquid 160 mg/5 mL **OR** acetaminophen, guaiFENesin-dextromethorphan, HYDROmorphone (DILAUDID) injection, prochlorperazine, sodium chloride flush  Diet Orders (From admission, onward)     Start     Ordered   10/28/20 1123  Diet regular Room service appropriate? Yes; Fluid consistency: Thin  Diet effective now       Question Answer Comment  Room service appropriate? Yes   Fluid consistency: Thin      10/28/20 1123            DVT prophylaxis:      Code Status: DNR  Family Communication: No family at bedside  Status is: Inpatient  Remains inpatient appropriate because:IV treatments appropriate due to intensity of illness or inability to take PO and Inpatient level of care appropriate due to severity of illness  Dispo: The patient is from: Home              Anticipated d/c is to: SNF              Patient currently is not medically stable to d/c.   Difficult to place patient No   Level of care: Med-Surg  Consultants:  Oncology, Dr. Marin Olp  Procedures:  none  Microbiology   none  Antimicrobials: none    Objective: Vitals:   11/03/20 1909 11/03/20 2226 11/04/20 0308 11/04/20 0557  BP: 132/90 106/75 97/64 106/78  Pulse: 88 95 84 90  Resp: '16 17 14 16  '$ Temp: 97.9 F (36.6 C) (!) 97.5 F (36.4 C) (!) 97.4 F (36.3 C) (!) 97.4 F (36.3 C)  TempSrc: Oral Axillary Oral Oral  SpO2: 99% 98% 100% 98%  Weight: 77.7 kg     Height:        Intake/Output Summary (Last 24 hours) at 11/04/2020 1254 Last data filed at 11/04/2020 0700 Gross per 24 hour  Intake 350.94 ml  Output 200 ml  Net 150.94 ml   Filed Weights   10/08/20 1351 10/29/20 0500 11/03/20 1909  Weight: 87.7 kg 81.2 kg 77.7 kg    Examination:  Constitutional: NAD Eyes: no scleral icterus ENMT: Mucous membranes are moist.  Neck: normal, supple Respiratory: clear to auscultation bilaterally, no wheezing, no crackles. Normal respiratory effort. No accessory muscle use.  Cardiovascular: Regular rate and rhythm, no murmurs / rubs / gallops. No LE edema.  Abdomen: non distended, no tenderness. Bowel sounds positive.  Musculoskeletal: no clubbing / cyanosis.  Skin: no rashes Neurologic: CN 2-12 grossly intact. Strength 5/5 in all 4.   Data Reviewed: I have independently reviewed following labs and imaging studies   CBC: Recent Labs  Lab 10/30/20 1816 11/01/20 0321 11/01/20 1346 11/02/20 0320 11/03/20 0219 11/04/20 0505  WBC 1.3* 3.0*  --  4.0 3.9* 3.1*  HGB 10.6* 9.9* 10.4* 10.3* 10.1* 10.7*  HCT 33.3* 30.8* 32.6* 32.2* 32.0* 33.3*  MCV 83.5 83.5  --  84.3 84.9 84.3  PLT 108* 97*  --  92* 94* [id number]*   Basic Metabolic Panel: Recent Labs  Lab 10/30/20 1816 11/02/20 0320 11/03/20 0219 11/04/20 0505  NA 135 136 134* 140  K 4.5 3.8 3.7 3.7  CL 109 107 107 113*  CO2 17* 19* 20* 20*  GLUCOSE 65* 73 75 79  BUN 33* 32* 30* 26*  CREATININE 1.10* 0.96 0.88 0.98  CALCIUM 8.3* 8.1* 7.8* 8.2*   Liver Function Tests: Recent Labs  Lab 10/30/20 1816 11/02/20 0320 11/03/20 0219  11/04/20 0505  AST 17 14* 14* 16  ALT '12 12 11 11  '$ ALKPHOS 140* 138* 144* 160*  BILITOT 1.0 1.1 0.9 0.8  PROT 5.6* 5.5* 5.5* 5.5*  ALBUMIN 2.3* 2.2* 2.2* 2.3*   Coagulation Profile: No results for input(s): INR, PROTIME in the last 168 hours. HbA1C: No results for input(s): HGBA1C in the last 72 hours. CBG: Recent Labs  Lab 10/28/20 2350 10/29/20 0508 10/29/20 0834 10/29/20 1220 10/29/20 1656  GLUCAP 91 86 81 75 71    No results found for this or any previous visit (from the past 240 hour(s)).   Radiology Studies: VAS [place] LOWER EXTREMITY VENOUS (DVT)  Result Date: 11/04/2020  Lower Venous DVT Study Patient Name:  MIRIANA TEUBER  Date of Exam:   11/04/2020 Medical Rec #: [id number]       Accession #:    [id number] Date of Birth:  Dec 08, 1955       Patient Gender: F Patient Age:   35 years Exam Location:  [facility] Procedure:      VAS [place] LOWER EXTREMITY VENOUS (DVT) Referring Phys: Hosie Poisson --------------------------------------------------------------------------------  Indications: Swelling.  Risk Factors: Cancer. Limitations: Poor ultrasound/tissue interface. Comparison Study: No prior studies. Performing Technologist: Oliver Hum RVT  Examination Guidelines: A complete evaluation includes B-mode imaging, spectral Doppler, color Doppler, and power Doppler as needed of all accessible portions of each vessel. Bilateral testing is considered an integral part of a complete examination. Limited examinations for reoccurring indications may be performed as noted. The reflux portion of the exam is performed with the patient in reverse Trendelenburg.  +---------+---------------+---------+-----------+----------+--------------+ RIGHT    CompressibilityPhasicitySpontaneityPropertiesThrombus Aging +---------+---------------+---------+-----------+----------+--------------+ CFV      Full           Yes      Yes                                  +---------+---------------+---------+-----------+----------+--------------+ SFJ      Full                                                        +---------+---------------+---------+-----------+----------+--------------+ FV Prox  Full                                                        +---------+---------------+---------+-----------+----------+--------------+ FV Mid   Full                                                        +---------+---------------+---------+-----------+----------+--------------+ FV DistalFull                                                        +---------+---------------+---------+-----------+----------+--------------+ PFV      Full                                                        +---------+---------------+---------+-----------+----------+--------------+ POP      Full           Yes      Yes                                 +---------+---------------+---------+-----------+----------+--------------+ PTV      Full                                                        +---------+---------------+---------+-----------+----------+--------------+  PERO     Full                                                        +---------+---------------+---------+-----------+----------+--------------+   +---------+---------------+---------+-----------+----------+--------------+ LEFT     CompressibilityPhasicitySpontaneityPropertiesThrombus Aging +---------+---------------+---------+-----------+----------+--------------+ CFV      Full           Yes      Yes                                 +---------+---------------+---------+-----------+----------+--------------+ SFJ      Full                                                        +---------+---------------+---------+-----------+----------+--------------+ FV Prox  Full                                                         +---------+---------------+---------+-----------+----------+--------------+ FV Mid   Full                                                        +---------+---------------+---------+-----------+----------+--------------+ FV DistalFull                                                        +---------+---------------+---------+-----------+----------+--------------+ PFV      Full                                                        +---------+---------------+---------+-----------+----------+--------------+ POP      Partial        Yes      Yes                  Acute          +---------+---------------+---------+-----------+----------+--------------+ PTV      Partial                                      Acute          +---------+---------------+---------+-----------+----------+--------------+ PERO     Partial                                      Acute          +---------+---------------+---------+-----------+----------+--------------+    Summary: RIGHT: -  There is no evidence of deep vein thrombosis in the lower extremity. However, portions of this examination were limited- see technologist comments above.  - No cystic structure found in the popliteal fossa.  LEFT: - Findings consistent with acute deep vein thrombosis involving the left popliteal vein, left posterior tibial veins, and left peroneal veins. - No cystic structure found in the popliteal fossa.  *See table(s) above for measurements and observations.    Preliminary     Marzetta Board, MD, PhD [facility]  Between 7 am - 7 pm I am available, please contact me via Amion (for emergencies) or Securechat (non urgent messages)  Between 7 pm - 7 am I am not available, please contact night coverage MD/APP via Amion

## 2020-11-04 NOTE — Progress Notes (Signed)
Beth Sanchez is now up on 6 E.  We will hopefully go ahead and do chemotherapy today.  I will still use single agent carboplatinum but will try to add Avastin.  I really believe Avastin is going to be helpful to help with respect to fluid reduction.  Her blood pressure can certainly tolerate Avastin.  Her labs show white cell count 3.1.  Hemoglobin 10.7.  Platelet count 111,000.  Her BUN is 26 creatinine 0.98.  She is still eating.  She is nibbling.  There is no vomiting.  Hopefully she will have a bowel movement today.  She really is going need a lot of physical therapy.  She definitely is deconditioned.  Hopefully, she will improve with her status.  She tells me that she and her sister want to go to [place] in October and go on a family cruise.  I will have brought with her doing this.  The real question is whether or not she is can be strong enough to go on this vacation.  She does not complain of any pain.  There is no bleeding.  Her vital signs are temperature of 97.4.  Pulse 90.  Blood pressure 106/78.  Her lungs are clear.  Cardiac exam regular rate and rhythm.  Abdomen is slightly distended.  Bowel sounds are present but decreased.  There is no guarding or rebound tenderness.  Extremity shows no clubbing, cyanosis or edema.  Neurological exam is nonfocal.  Hopefully, she will have chemotherapy today.  She had 1 cycle at work very nicely with a drop in her CA-125.  She has bowel sounds.  She is eating now and not really throwing up.  I will see if the peritoneal catheter can be opened up today and see how much fluid is drained out.  I know the staff on 6 E. we will do a fantastic job with her.  I know this is a very complicated case.  Lattie Haw, MD  Darlyn Chamber 33:6

## 2020-11-04 NOTE — Progress Notes (Signed)
Physical Therapy Treatment Patient Details Name: Beth Sanchez MRN: [id number] DOB: [birth year] Today's Date: 11/04/2020   History of Present Illness 65 yo female admitted with ascites, pain, hypotension, peritoneal cancer. Hx of DM, obesity, cirrhosis, COVID. Pt transfered to ICU due to Shock, presumed septic (likely source LLL PNA) with component of chronic hypotension. Pt continues to have N/V.    PT Comments    Pt is AxO x 3 very pleasant and motivated.  She shares that she has been getting OOB to [facility] more each day.  She wants to get better and stronger so she can go on a cruise in October with her family.  "I don't even have to get off the ship",  "I just need to get to be able to walk more"  Doppler from yesterday indicates an Acute DVT involving the left popliteal vein, left posterior tibial veins, and left peroneal veins.  Limited session  Recommendations for follow up therapy are one component of a multi-disciplinary discharge planning process, led by the attending physician.  Recommendations may be updated based on patient status, additional functional criteria and insurance authorization.  Follow Up Recommendations  SNF     Equipment Recommendations       Recommendations for Other Services       Precautions / Restrictions Precautions Precautions: Fall Restrictions Weight Bearing Restrictions: No     Mobility  Bed Mobility Overal bed mobility: Needs Assistance Bed Mobility: Supine to Sit     Supine to sit: Min guard;Min assist     General bed mobility comments: pt did well to transition self to EOB with increased time and use of rail    Transfers Overall transfer level: Needs assistance Equipment used: Rolling walker (2 wheeled) Transfers: Sit to/from Stand Sit to Stand: Supervision;Min guard         General transfer comment: pt did well ans was able to self rise from elevated bed with use of walker.  Ambulation/Gait Ambulation/Gait assistance: Min  guard;Supervision Gait Distance (Feet): 2 Feet Assistive device: Rolling walker (2 wheeled) Gait Pattern/deviations: Step-through pattern;Decreased stride length Gait velocity: decr   General Gait Details: only assisted a few steps from bed to recliner due to L LE pain (+DVT)   Stairs             Wheelchair Mobility    Modified Rankin (Stroke Patients Only)       Balance                                            Cognition Arousal/Alertness: Awake/alert Behavior During Therapy: WFL for tasks assessed/performed Overall Cognitive Status: Within Functional Limits for tasks assessed                                 General Comments: AxO x 3 very pleasant and aware of her current medical status.      Exercises      General Comments        Pertinent Vitals/Pain Pain Assessment: Faces Pain Location: back (chronic) Pain Descriptors / Indicators: Discomfort;Grimacing Pain Intervention(s): Monitored during session    Home Living                      Prior Function            PT Goals (  current goals can now be found in the care plan section) Acute Rehab PT Goals Patient Stated Goal: to go on a cruise with family in October    Frequency    Min 3X/week      PT Plan Current plan remains appropriate    Co-evaluation              AM-PAC PT "6 Clicks" Mobility   Outcome Measure  Help needed turning from your back to your side while in a flat bed without using bedrails?: A Little Help needed moving from lying on your back to sitting on the side of a flat bed without using bedrails?: A Little Help needed moving to and from a bed to a chair (including a wheelchair)?: A Little Help needed standing up from a chair using your arms (e.g., wheelchair or bedside chair)?: A Little Help needed to walk in hospital room?: A Lot Help needed climbing 3-5 steps with a railing? : A Lot 6 Click Score: 16    End of Session  Equipment Utilized During Treatment: Gait belt Activity Tolerance: No increased pain Patient left: in chair;with call bell/phone within reach;with chair alarm set   PT Visit Diagnosis: Other abnormalities of gait and mobility (R26.89);Muscle weakness (generalized) (M62.81)     Time: [id number] PT Time Calculation (min) (ACUTE ONLY): 15 min  Charges:  $Gait Training: 8-22 mins                     {Majid Mccravy  PTA Acute  Rehabilitation Services Pager      [phone] Office      [phone]

## 2020-11-05 DIAGNOSIS — C482 Malignant neoplasm of peritoneum, unspecified: Secondary | ICD-10-CM | POA: Diagnosis not present

## 2020-11-05 LAB — COMPREHENSIVE METABOLIC PANEL
ALT: 11 U/L (ref 0–44)
AST: 15 U/L (ref 15–41)
Albumin: 2.1 g/dL — ABNORMAL LOW (ref 3.5–5.0)
Alkaline Phosphatase: 148 U/L — ABNORMAL HIGH (ref 38–126)
Anion gap: 7 (ref 5–15)
BUN: 28 mg/dL — ABNORMAL HIGH (ref 8–23)
CO2: 18 mmol/L — ABNORMAL LOW (ref 22–32)
Calcium: 7.5 mg/dL — ABNORMAL LOW (ref 8.9–10.3)
Chloride: 109 mmol/L (ref 98–111)
Creatinine, Ser: 1.09 mg/dL — ABNORMAL HIGH (ref 0.44–1.00)
GFR, Estimated: 56 mL/min — ABNORMAL LOW (ref >60)
Glucose, Bld: 125 mg/dL — ABNORMAL HIGH (ref 70–99)
Potassium: 3.7 mmol/L (ref 3.5–5.1)
Sodium: 134 mmol/L — ABNORMAL LOW (ref 135–145)
Total Bilirubin: 0.7 mg/dL (ref 0.3–1.2)
Total Protein: 5.3 g/dL — ABNORMAL LOW (ref 6.5–8.1)

## 2020-11-05 LAB — CBC
HCT: 31.4 % — ABNORMAL LOW (ref 36.0–46.0)
Hemoglobin: 10 g/dL — ABNORMAL LOW (ref 12.0–15.0)
MCH: 27.2 pg (ref 26.0–34.0)
MCHC: 31.8 g/dL (ref 30.0–36.0)
MCV: 85.3 fL (ref 80.0–100.0)
Platelets: 115 10*3/uL — ABNORMAL LOW (ref 150–400)
RBC: 3.68 MIL/uL — ABNORMAL LOW (ref 3.87–5.11)
RDW: 18.3 % — ABNORMAL HIGH (ref 11.5–15.5)
WBC: 2.5 10*3/uL — ABNORMAL LOW (ref 4.0–10.5)
nRBC: 0 % (ref 0.0–0.2)

## 2020-11-05 LAB — HEPARIN LEVEL (UNFRACTIONATED): Heparin Unfractionated: 0.68 IU/mL (ref 0.30–0.70)

## 2020-11-05 MED ORDER — SODIUM CHLORIDE 0.9 % IV SOLN
Freq: Two times a day (BID) | INTRAVENOUS | Status: AC
  Administered 2020-11-05: 10 mg via INTRAVENOUS
  Administered 2020-11-05 – 2020-11-06 (×2): 8 mg via INTRAVENOUS
  Administered 2020-11-06: 10 mg via INTRAVENOUS
  Filled 2020-11-05: qty 1.4
  Filled 2020-11-05 (×3): qty 4

## 2020-11-05 MED ORDER — APIXABAN 5 MG PO TABS
10.0000 mg | ORAL_TABLET | Freq: Two times a day (BID) | ORAL | Status: DC
  Administered 2020-11-05 – 2020-11-09 (×8): 10 mg via ORAL
  Filled 2020-11-05 (×11): qty 2

## 2020-11-05 MED ORDER — APIXABAN 5 MG PO TABS: 5.0000 mg | ORAL_TABLET | Freq: Two times a day (BID) | ORAL | Status: DC

## 2020-11-05 MED ORDER — PANTOPRAZOLE SODIUM 40 MG PO TBEC
40.0000 mg | DELAYED_RELEASE_TABLET | Freq: Two times a day (BID) | ORAL | Status: DC
  Administered 2020-11-05 – 2020-11-07 (×5): 40 mg via ORAL
  Filled 2020-11-05 (×9): qty 1

## 2020-11-05 MED ORDER — SODIUM CHLORIDE 0.9 % IV SOLN: INTRAVENOUS | Status: DC | PRN

## 2020-11-05 NOTE — Discharge Instructions (Addendum)
Information on my medicine - ELIQUIS (apixaban)  Why was Eliquis prescribed for you? Eliquis was prescribed to treat blood clots that may have been found in the veins of your legs (deep vein thrombosis) or in your lungs (pulmonary embolism) and to reduce the risk of them occurring again.  What do You need to know about Eliquis ? The starting dose is 10 mg (two 5 mg tablets) taken TWICE daily for the FIRST SEVEN (7) DAYS, then on (enter date)  11/12/2020  the dose is reduced to ONE 5 mg tablet taken TWICE daily.  Eliquis may be taken with or without food.   Try to take the dose about the same time in the morning and in the evening. If you have difficulty swallowing the tablet whole please discuss with your pharmacist how to take the medication safely.  Take Eliquis exactly as prescribed and DO NOT stop taking Eliquis without talking to the doctor who prescribed the medication.  Stopping may increase your risk of developing a new blood clot.  Refill your prescription before you run out.  After discharge, you should have regular check-up appointments with your healthcare provider that is prescribing your Eliquis.    What do you do if you miss a dose? If a dose of ELIQUIS is not taken at the scheduled time, take it as soon as possible on the same day and twice-daily administration should be resumed. The dose should not be doubled to make up for a missed dose.  Important Safety Information A possible side effect of Eliquis is bleeding. You should call your healthcare provider right away if you experience any of the following: Bleeding from an injury or your nose that does not stop. Unusual colored urine (red or dark brown) or unusual colored stools (red or black). Unusual bruising for unknown reasons. A serious fall or if you hit your head (even if there is no bleeding).  Some medicines may interact with Eliquis and might increase your risk of bleeding or clotting while on Eliquis. To  help avoid this, consult your healthcare provider or pharmacist prior to using any new prescription or non-prescription medications, including herbals, vitamins, non-steroidal anti-inflammatory drugs (NSAIDs) and supplements.  This website has more information on Eliquis (apixaban): http://[email or web address].com/eliquis/home

## 2020-11-05 NOTE — Evaluation (Signed)
Occupational Therapy Evaluation Patient Details Name: Beth Sanchez MRN: [id number] DOB: 10-Mar-1955 Today's Date: 11/05/2020   History of Present Illness 65 yo female admitted with ascites, pain, hypotension, peritoneal cancer. Hx of DM, obesity, cirrhosis, COVID. Pt transfered to ICU due to Shock, presumed septic (likely source LLL PNA) with component of chronic hypotension. Pt continues to have N/V.   Clinical Impression   Patient typically independent at baseline and does not use an adaptive device. Currently patient with impairments including poor balance, standing tolerance, safety and overall strength. Patient needing min A with cues for hand placement to power up to standing, has sudden onset dizziness resulting in lateral loss of balance, letting go of walker and needing mod A from OT to sit back onto bed. CNA present for second standing attempt needing min A x2 for safety with stability and total A for perianal care in standing. Patient needing verbal cues to sequence taking few steps over to recliner with min x2 for safety. If patient can have 24/7 family support at home would recommend [facility], however if not and with near fall during OT session would recommend short term rehab at D/C.      Recommendations for follow up therapy are one component of a multi-disciplinary discharge planning process, led by the attending physician.  Recommendations may be updated based on patient status, additional functional criteria and insurance authorization.   Follow Up Recommendations  Home health OT;Supervision/Assistance - 24 hour;Other (comment) (vs rehab if patient cannot have 24/7 support at home)    Equipment Recommendations  Tub/shower seat;Other (comment) (rolling walker)       Precautions / Restrictions Precautions Precautions: Fall Precaution Comments: dizziness episode with OT and near fall 9/15 Restrictions Weight Bearing Restrictions: No      Mobility Bed Mobility Overal bed  mobility: Needs Assistance Bed Mobility: Rolling;Sidelying to Sit Rolling: Min assist Sidelying to sit: Min assist       General bed mobility comments: increased time, needs bed height elevated and heavily reliant on bed rail. min A for trunk    Transfers Overall transfer level: Needs assistance Equipment used: Rolling walker (2 wheeled) Transfers: Sit to/from [facility] Sit to Stand: Min assist;Mod assist;+2 physical assistance;+2 safety/equipment Stand pivot transfers: Min assist;+2 physical assistance;+2 safety/equipment       General transfer comment: please see toilet transfer in ADL section; had near fall with initial standing and attempt to get to chair therefore CNA present for second attempt    Balance Overall balance assessment: Needs assistance Sitting-balance support: Feet supported Sitting balance-Leahy Scale: Fair     Standing balance support: Bilateral upper extremity supported Standing balance-Leahy Scale: Poor                             ADL either performed or assessed with clinical judgement   ADL Overall ADL's : Needs assistance/impaired Eating/Feeding: Independent;Sitting   Grooming: Set up;Sitting   Upper Body Bathing: Set up;Sitting   Lower Body Bathing: Moderate assistance;Sitting/lateral leans;Sit to/from stand   Upper Body Dressing : Set up;Sitting   Lower Body Dressing: Moderate assistance;Sit to/from stand;Sitting/lateral leans   Toilet Transfer: Minimal assistance;Moderate assistance;+2 for safety/equipment;Cueing for safety;Cueing for sequencing;RW Toilet Transfer Details (indicate cue type and reason): first sit to stand note patient incontinent of small amount of stool, patient had sudden dizzy onset with lateral loss of balance letting go of walker and needing mod A to safely sit onto edge of bed. Asked  CNA to assist for second sit to stand with min A x2 and use of rolling walker to transfer to  chair. Toileting- Clothing Manipulation and Hygiene: Total assistance;Sit to/from stand Toileting - Clothing Manipulation Details (indicate cue type and reason): reliant on UE support/poor standing balance therefore total A to clean perianal area     Functional mobility during ADLs: Minimal assistance;+2 for safety/equipment;Cueing for safety;Cueing for sequencing;Rolling walker General ADL Comments: patient requiring increased assistance for self care tasks due to weakness, poor balance, standing tolerance      Pertinent Vitals/Pain Pain Assessment: Faces Faces Pain Scale: Hurts a little bit Pain Location: back (chronic) Pain Descriptors / Indicators: Discomfort;Grimacing Pain Intervention(s): Premedicated before session     Hand Dominance  (did not specify)   Extremity/Trunk Assessment Upper Extremity Assessment Upper Extremity Assessment: Generalized weakness   Lower Extremity Assessment Lower Extremity Assessment: Defer to PT evaluation       Communication Communication Communication: No difficulties   Cognition Arousal/Alertness: Awake/alert Behavior During Therapy: WFL for tasks assessed/performed Overall Cognitive Status: Within Functional Limits for tasks assessed                                                Home Living Family/patient expects to be discharged to:: Private residence Living Arrangements: Other relatives Available Help at Discharge: Family Type of Home: House       Home Layout: Able to live on main level with bedroom/bathroom               Home Equipment: None          Prior Functioning/Environment Level of Independence: Independent                 OT Problem List: Decreased strength;Decreased activity tolerance;Impaired balance (sitting and/or standing);Decreased safety awareness      OT Treatment/Interventions: Self-care/ADL training;Therapeutic activities;Patient/family education;Balance training     OT Goals(Current goals can be found in the care plan section) Acute Rehab OT Goals Patient Stated Goal: to go on a cruise with family in October OT Goal Formulation: With patient Time For Goal Achievement: 11/19/20 Potential to Achieve Goals: Good  OT Frequency: Min 2X/week    AM-PAC OT "6 Clicks" Daily Activity     Outcome Measure Help from another person eating meals?: None Help from another person taking care of personal grooming?: A Little Help from another person toileting, which includes using toliet, bedpan, or urinal?: A Lot Help from another person bathing (including washing, rinsing, drying)?: A Lot Help from another person to put on and taking off regular upper body clothing?: A Little Help from another person to put on and taking off regular lower body clothing?: A Lot 6 Click Score: 16   End of Session Equipment Utilized During Treatment: Rolling walker Nurse Communication: Mobility status  Activity Tolerance: Patient tolerated treatment well Patient left: in chair;with call bell/phone within reach;with chair alarm set  OT Visit Diagnosis: Unsteadiness on feet (R26.81);Other abnormalities of gait and mobility (R26.89);Muscle weakness (generalized) (M62.81)                Time: [id number] OT Time Calculation (min): 23 min Charges:  OT General Charges $OT Visit: 1 Visit OT Evaluation $OT Eval Low Complexity: 1 Low OT Treatments $Self Care/Home Management : 8-22 mins  Delbert Phenix OT OT pager: [place] 11/05/2020,  12:50 PM

## 2020-11-05 NOTE — Progress Notes (Signed)
ANTICOAGULATION CONSULT NOTE    Pharmacy Consult for heparin Indication: DVT  No Known Allergies  Patient Measurements: Height: '5\' 7"'$  (170.2 cm) Weight: 77.7 kg (171 lb 4.8 oz) IBW/kg (Calculated) : 61.6 Heparin Dosing Weight: 77 kg  Vital Signs: Temp: 97.4 F (36.3 C) (09/14 2112) Temp Source: Oral (09/14 2112) BP: 111/79 (09/14 2112) Pulse Rate: 80 (09/14 2112)  Labs: Recent Labs    11/02/20 0320 11/03/20 0219 11/04/20 0505 11/04/20 1835 11/05/20 0051  HGB 10.3* 10.1* 10.7*  --   --   HCT 32.2* 32.0* 33.3*  --   --   PLT 92* 94* 111*  --   --   HEPARINUNFRC  --   --   --  0.38 0.68  CREATININE 0.96 0.88 0.98  --   --      Estimated Creatinine Clearance: 61.4 mL/min (by C-G formula based on SCr of 0.98 mg/dL).   Medical History: Past Medical History:  Diagnosis Date   AKI (acute kidney injury) ([place]) 10/05/2020   Cancer associated pain 10/05/2020   Cirrhosis of liver ([place]) 10/05/2020   Comfort measures only status 10/22/2020   COVID-19 virus infection 10/05/2020   DNR (do not resuscitate) 10/22/2020   Failure to thrive in adult 10/05/2020   Goals of care, counseling/discussion 10/12/2020   Hyperlipidemia associated with type 2 diabetes mellitus ([place]) 10/23/2016   Hypertension    Iron deficiency anemia 10/05/2020   Malignant ascites 10/05/2020   N&V (nausea and vomiting) 10/05/2020   Ovarian cancer on right ([place]) 10/05/2020   Palliative care by specialist 10/21/2020   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  Assessment: Pt is a 67 yoF admitted with abdominal pain. PMH significant for recently diagnosed peritoneal  carcinomatosis with plans to receive chemotherapy today. Venous doppler positive for acute DVT involving the left popliteal vein, left posterior tibial veins, and left peroneal veins. Pharmacy consulted to dose/monitor heparin drip.   Today, 11/05/20 Heparin level = 0.68 (therapeutic) with heparin gtt @ 1200 units/hr (level therapeutic x 2 on this rate) No complications of therapy noted CBC - not drawn yet for today  Goal of Therapy:  Heparin level 0.3-0.7 units/ml Monitor platelets by anticoagulation protocol: Yes   Plan:  Continue Heparin gtt @ 1200 units/hr HL, CBC daily while on heparin drip Monitor for signs of bleeding Follow for eventual transition to PO anticoagulation  Anabeth Chilcott, Toribio Harbour, PharmD 11/05/2020,1:25 AM

## 2020-11-05 NOTE — Progress Notes (Signed)
Ms. Beth Sanchez to get her chemotherapy yesterday.  She received carboplatinum.  She did well with that.  I will make that she is on a good antiemetic regimen just to make sure that she does not get sick.  She had an 1300 cc of fluid taken off the abdomen yesterday.  I do appreciate the nurses for doing this for her.  Her abdomen definitely is not as distended.  Unfortunately, she has a thrombus in the left leg.  I hate that she developed this.  She was on prophylactic anticoagulation.  Now she is on IV heparin.  I would probably go ahead and switch her over to an oral regimen.  I think this should be fairly effective.  Apparently she did have a bowel movement yesterday.  To try to sort out where she is going to go for rehabilitation.  She definitely is to be given rehabilitation before she can go home.  Her labs show white cell count 2.5.  Hemoglobin 10 hematocrit 31.4.  Platelet count 115,000.  Her BUN is 28 creatinine 1.09.  Calcium is 7.5 with an albumin of 2.1.  Her blood sugar is 125.  We may have to increase the fluid that she is getting.  On her physical exam, her temperature is 97.4.  Pulse 82.  Blood pressure 112/83.  Her lungs are clear bilaterally.  Cardiac exam regular rate and rhythm.  Abdomen is soft.  Bowel sounds are present but decreased.  There is no guarding or rebound tenderness.  There is no obvious abdominal mass.  Extremities does show some slight swelling in the left leg.  Neurological exam is nonfocal.  Hopefully, Beth Sanchez will be able to go to rehabilitation next week.  She had her second dose of carboplatinum.  Because of the DVT, we probably will be able to give Avastin.  I will try to add the Taxol as an outpatient.  I would switch her over to Eliquis for anticoagulation.  Again, I know this is incredibly complicated.  The staff on [address] are doing a fantastic job taking care of her.  I am just incredibly impressed with their compassion and there professional  care.  Lattie Haw, MD  Oswaldo Milian 57:18

## 2020-11-05 NOTE — Progress Notes (Signed)
PROGRESS NOTE  Beth Sanchez [id number] DOB: 1955/12/19 DOA: 10/05/2020 PCP: Pcp, No   LOS: 31 days   Brief Narrative / Interim history: 65 year old with history of HTN, DM2, liver cirrhosis, peritoneal carcinomatosis comes into the ED with back pain, abdominal distention, nausea and vomiting.  She was diagnosed with COVID in August and at that time she was found to have cirrhosis, ascites ultimately diagnosed with peritoneal carcinomatosis.  She underwent multiple paracentesis in 1 cycle of chemotherapy.  She had difficulties tolerating her first cycle of chemo.  Hospital course complicated by presumed septic shock from left lower lobe pneumonia, briefly requiring pressors, on underlying chronic hypotension.  She was eventually transferred out of the ICU.  Oncology consulted currently getting a second round of chemotherapy  Subjective / 24h Interval events: Complains of feeling very weak, no nausea, no vomiting  Assessment & Plan: Principal Problem Peritoneal carcinomatosis with malignant ascites-possibly due to right-sided ovarian cancer.  Oncology and palliative care consulted.  Status post second round of chemo 9/14 -Appreciate oncology follow-up -She has a peritoneal drain placed 9/2 -Continue pain control with fentanyl patch, Dilaudid as needed  Active Problems Septic shock from left lower lobe pneumonia, chronic hypotension-she was briefly in the ICU requiring pressors.  Currently completed antibiotic course.  She is on midodrine as well as fludrocortisone for chronic hypotension  Acute DVT-p initially on heparin, now transition to Eliquis.  Discussed with Dr. Marin Olp  AKI in the setting of shock-creatinine improved  Acute hypoxic respiratory failure on 9/1-due to pneumonia, completed antibiotics, currently on room air  Liver cirrhosis, malignant ascites-monitor  Pancytopenia-due to chemotherapy, continue to closely monitor  Scheduled Meds:  apixaban  10 mg Oral BID    Followed by   Derrill Memo ON 11/12/2020] apixaban  5 mg Oral BID   fentaNYL  1 patch Transdermal Q72H   fludrocortisone  0.4 mg Oral BID   lidocaine  15 mL Oral Once   lip balm   Topical Once   mouth rinse  15 mL Mouth Rinse BID   midodrine  20 mg Oral TID WC   pantoprazole  40 mg Oral BID   Ensure Max Protein  11 oz Oral BID   sodium chloride flush  10-40 mL Intracatheter Q12H   Continuous Infusions:  sodium chloride     sodium chloride     ondansetron (ZOFRAN) with dexamethasone (DECADRON) IV 8 mg (11/05/20 0817)   promethazine (PHENERGAN) injection (IM or IVPB) 12.5 mg (11/05/20 0954)   PRN Meds:.sodium chloride, acetaminophen (TYLENOL) oral liquid 160 mg/5 mL **OR** acetaminophen, guaiFENesin-dextromethorphan, HYDROmorphone (DILAUDID) injection, prochlorperazine, sodium chloride flush  Diet Orders (From admission, onward)     Start     Ordered   10/28/20 1123  Diet regular Room service appropriate? Yes; Fluid consistency: Thin  Diet effective now       Question Answer Comment  Room service appropriate? Yes   Fluid consistency: Thin      10/28/20 1123            DVT prophylaxis:  apixaban (ELIQUIS) tablet 10 mg  apixaban (ELIQUIS) tablet 5 mg     Code Status: DNR  Family Communication: No family at bedside  Status is: Inpatient  Remains inpatient appropriate because:IV treatments appropriate due to intensity of illness or inability to take PO and Inpatient level of care appropriate due to severity of illness  Dispo: The patient is from: Home              Anticipated d/c  is to: SNF              Patient currently is not medically stable to d/c.   Difficult to place patient No   Level of care: Med-Surg  Consultants:  Oncology, Dr. Marin Olp  Procedures:  none  Microbiology  none  Antimicrobials: none    Objective: Vitals:   11/04/20 0557 11/04/20 1321 11/04/20 2112 11/05/20 0536  BP: 106/78 104/78 111/79 112/83  Pulse: 90 (!) 101 80 82  Resp: '16 16 14  14  '$ Temp: (!) 97.4 F (36.3 C) 97.6 F (36.4 C) (!) 97.4 F (36.3 C) (!) 97.4 F (36.3 C)  TempSrc: Oral Oral Oral Oral  SpO2: 98% 95% 99% 98%  Weight:      Height:        Intake/Output Summary (Last 24 hours) at 11/05/2020 1132 Last data filed at 11/05/2020 1007 Gross per 24 hour  Intake 1186.5 ml  Output 1350 ml  Net -163.5 ml    Filed Weights   10/08/20 1351 10/29/20 0500 11/03/20 1909  Weight: 87.7 kg 81.2 kg 77.7 kg    Examination:  Constitutional: No distress, in chair about to eat breakfast Eyes: Anicteric ENMT: mmm Neck: normal, supple Respiratory: Clear bilaterally, no wheezing Cardiovascular: Heart is regular, no murmurs, no peripheral edema Abdomen: Nondistended, bowel sounds positive Musculoskeletal: no clubbing / cyanosis.  Skin: No rashes Neurologic: No focal deficits  Data Reviewed: I have independently reviewed following labs and imaging studies   CBC: Recent Labs  Lab 11/01/20 0321 11/01/20 1346 11/02/20 0320 11/03/20 0219 11/04/20 0505 11/05/20 0434  WBC 3.0*  --  4.0 3.9* 3.1* 2.5*  HGB 9.9* 10.4* 10.3* 10.1* 10.7* 10.0*  HCT 30.8* 32.6* 32.2* 32.0* 33.3* 31.4*  MCV 83.5  --  84.3 84.9 84.3 85.3  PLT 97*  --  92* 94* 111* 115*    Basic Metabolic Panel: Recent Labs  Lab 10/30/20 1816 11/02/20 0320 11/03/20 0219 11/04/20 0505 11/05/20 0434  NA 135 136 134* 140 134*  K 4.5 3.8 3.7 3.7 3.7  CL 109 107 107 113* 109  CO2 17* 19* 20* 20* 18*  GLUCOSE 65* 73 75 79 125*  BUN 33* 32* 30* 26* 28*  CREATININE 1.10* 0.96 0.88 0.98 1.09*  CALCIUM 8.3* 8.1* 7.8* 8.2* 7.5*    Liver Function Tests: Recent Labs  Lab 10/30/20 1816 11/02/20 0320 11/03/20 0219 11/04/20 0505 11/05/20 0434  AST 17 14* 14* 16 15  ALT '12 12 11 11 11  '$ ALKPHOS 140* 138* 144* 160* 148*  BILITOT 1.0 1.1 0.9 0.8 0.7  PROT 5.6* 5.5* 5.5* 5.5* 5.3*  ALBUMIN 2.3* 2.2* 2.2* 2.3* 2.1*    Coagulation Profile: No results for input(s): INR, PROTIME in the last 168  hours. HbA1C: No results for input(s): HGBA1C in the last 72 hours. CBG: Recent Labs  Lab 10/29/20 1220 10/29/20 1656  GLUCAP 75 71     No results found for this or any previous visit (from the past 240 hour(s)).   Radiology Studies: No results found.  Marzetta Board, MD, PhD [facility]  Between 7 am - 7 pm I am available, please contact me via Amion (for emergencies) or Securechat (non urgent messages)  Between 7 pm - 7 am I am not available, please contact night coverage MD/APP via Amion

## 2020-11-06 DIAGNOSIS — C482 Malignant neoplasm of peritoneum, unspecified: Secondary | ICD-10-CM | POA: Diagnosis not present

## 2020-11-06 LAB — COMPREHENSIVE METABOLIC PANEL
ALT: 11 U/L (ref 0–44)
AST: 15 U/L (ref 15–41)
Albumin: 2.2 g/dL — ABNORMAL LOW (ref 3.5–5.0)
Alkaline Phosphatase: 152 U/L — ABNORMAL HIGH (ref 38–126)
Anion gap: 4 — ABNORMAL LOW (ref 5–15)
BUN: 28 mg/dL — ABNORMAL HIGH (ref 8–23)
CO2: 20 mmol/L — ABNORMAL LOW (ref 22–32)
Calcium: 7.5 mg/dL — ABNORMAL LOW (ref 8.9–10.3)
Chloride: 115 mmol/L — ABNORMAL HIGH (ref 98–111)
Creatinine, Ser: 0.97 mg/dL (ref 0.44–1.00)
GFR, Estimated: >60 mL/min (ref >60)
Glucose, Bld: 113 mg/dL — ABNORMAL HIGH (ref 70–99)
Potassium: 3.9 mmol/L (ref 3.5–5.1)
Sodium: 139 mmol/L (ref 135–145)
Total Bilirubin: 0.5 mg/dL (ref 0.3–1.2)
Total Protein: 5.1 g/dL — ABNORMAL LOW (ref 6.5–8.1)

## 2020-11-06 LAB — CBC WITH DIFFERENTIAL/PLATELET
Abs Immature Granulocytes: 0.08 10*3/uL — ABNORMAL HIGH (ref 0.00–0.07)
Basophils Absolute: 0 10*3/uL (ref 0.0–0.1)
Basophils Relative: 0 %
Eosinophils Absolute: 0 10*3/uL (ref 0.0–0.5)
Eosinophils Relative: 0 %
HCT: 31.9 % — ABNORMAL LOW (ref 36.0–46.0)
Hemoglobin: 10 g/dL — ABNORMAL LOW (ref 12.0–15.0)
Immature Granulocytes: 3 %
Lymphocytes Relative: 7 %
Lymphs Abs: 0.2 10*3/uL — ABNORMAL LOW (ref 0.7–4.0)
MCH: 26.7 pg (ref 26.0–34.0)
MCHC: 31.3 g/dL (ref 30.0–36.0)
MCV: 85.1 fL (ref 80.0–100.0)
Monocytes Absolute: 0.2 10*3/uL (ref 0.1–1.0)
Monocytes Relative: 5 %
Neutro Abs: 2.7 10*3/uL (ref 1.7–7.7)
Neutrophils Relative %: 85 %
Platelets: 141 10*3/uL — ABNORMAL LOW (ref 150–400)
RBC: 3.75 MIL/uL — ABNORMAL LOW (ref 3.87–5.11)
RDW: 18.7 % — ABNORMAL HIGH (ref 11.5–15.5)
WBC: 3.2 10*3/uL — ABNORMAL LOW (ref 4.0–10.5)
nRBC: 0 % (ref 0.0–0.2)

## 2020-11-06 MED ORDER — BOOST / RESOURCE BREEZE PO LIQD CUSTOM: 1.0000 | Freq: Three times a day (TID) | ORAL | Status: DC

## 2020-11-06 MED ORDER — SODIUM CHLORIDE 0.9 % IV SOLN: INTRAVENOUS | Status: DC | PRN

## 2020-11-06 NOTE — Plan of Care (Addendum)
65/F admitted 10/05/20 with a diagnosis of "Primary Peritoneal Carcinomatosis". Patient now has a RLQ peritoneal drain. Patient remains on continuous IVF. Discharge to SNF soon.   Edit @ 0930 hrs: Call placed to materials management to request a PleurX drainage kit pursuant to the order to drain her abdomen today; awaiting supplies.  Edit @ 0949 hrs: patient refused all of her PO meds this AM (eliquis, florinef, protonix). The reason for her fusal seems to be related to pills "getting stuck". Offered other methods to assist with med intake (crushing, swallowing with applesauce, etc) however the patient refused these as well. Attempted to provide education to the patient about the importance of taking her meds to which she responded "the only thing I have to do is pay taxes and die". Dr. Cruzita Lederer notified.  Problem: Education: Goal: Knowledge of General Education information will improve Description: Including pain rating scale, medication(s)/side effects and non-pharmacologic comfort measures Outcome: Progressing   Problem: Health Behavior/Discharge Planning: Goal: Ability to manage health-related needs will improve Outcome: Progressing   Problem: Clinical Measurements: Goal: Ability to maintain clinical measurements within normal limits will improve Outcome: Progressing Goal: Will remain free from infection Outcome: Progressing Goal: Diagnostic test results will improve Outcome: Progressing Goal: Respiratory complications will improve Outcome: Progressing Goal: Cardiovascular complication will be avoided Outcome: Progressing   Problem: Activity: Goal: Risk for activity intolerance will decrease Outcome: Progressing   Problem: Nutrition: Goal: Adequate nutrition will be maintained Outcome: Progressing   Problem: Coping: Goal: Level of anxiety will decrease Outcome: Progressing   Problem: Elimination: Goal: Will not experience complications related to bowel motility Outcome:  Progressing Goal: Will not experience complications related to urinary retention Outcome: Progressing   Problem: Pain Managment: Goal: General experience of comfort will improve Outcome: Progressing   Problem: Safety: Goal: Ability to remain free from injury will improve Outcome: Progressing   Problem: Skin Integrity: Goal: Risk for impaired skin integrity will decrease Outcome: Progressing

## 2020-11-06 NOTE — Progress Notes (Signed)
So far, Beth Sanchez is doing okay after treatment.  She had treatment on Wednesday.  She had no nausea or vomiting.  She seems to be eating okay.  She says she had a bowel movement.  She is on Eliquis now.  She has a thrombus in the left leg.  Her labs today show white cell count 3.2.  Hemoglobin 10.  Platelet count 141,000.  Her BUN is 28 creatinine 0.97.  Calcium is 7.5 with albumin of 2.2.  There is no complaints of pain.  She has had no cough or shortness of breath.  There is no bleeding.  We are trying to figure out where she can go for rehab.  Her vital signs show temperature 97.7.  Pulse 70.  Blood pressure 107/72.  Her lungs are clear.  Cardiac exam regular rate and rhythm.  Abdomen is soft.  Bowel sounds are present but decreased.  There might be a little bit of a fluid wave.  There is no guarding or rebound tenderness.  Extremities shows no obvious clubbing, cyanosis or edema.  Neurological exam is nonfocal.  Beth Sanchez got her second cycle of carboplatinum 2 days ago.  So far, she has had no problems with nausea or vomiting.  She has taken an oral nutrition.  Her albumin is still quite low.  The goal now is to see about getting her to rehab.  She needs to strengthen.  She is quite deconditioned.  We can do her next treatment as an outpatient in my office.  Thankfully, her family doctor is located in our building.  She will no how to get there.  Beth Haw, MD  Beth Sanchez 1:37

## 2020-11-06 NOTE — Progress Notes (Signed)
Physical Therapy Treatment Patient Details Name: Beth Sanchez MRN: [id number] DOB: Jun 14, 1955 Today's Date: 11/06/2020   History of Present Illness 65 yo female admitted with ascites, pain, hypotension, peritoneal cancer on 10/05/09. Pt transfered to ICU due to Shock, presumed septic (likely source LLL PNA) with component of chronic hypotension. Now on med-surg. Pt found to have L LE DVT on 11/04/20.  Pt continues to have N/V.Hx of DM, obesity, cirrhosis, COVID.    PT Comments    Pt making good progress today.  She is very motivated and was able to ambulate 12'x2.  Required cues for safety and required rest breaks.  Continue to progress as able.     Recommendations for follow up therapy are one component of a multi-disciplinary discharge planning process, led by the attending physician.  Recommendations may be updated based on patient status, additional functional criteria and insurance authorization.  Follow Up Recommendations  SNF     Equipment Recommendations  Other (comment) (rollator)    Recommendations for Other Services       Precautions / Restrictions Precautions Precautions: Fall Precaution Comments: dizziness episode with OT and near fall 9/15     Mobility  Bed Mobility Overal bed mobility: Needs Assistance Bed Mobility: Supine to Sit     Supine to sit: Min assist     General bed mobility comments: increased time with min A to lift trunk    Transfers Overall transfer level: Needs assistance Equipment used: Rolling walker (2 wheeled) Transfers: Sit to/from Stand Sit to Stand: Min guard;Min assist         General transfer comment: Min A from bed and min guard from chair; increased time to rise  Ambulation/Gait Ambulation/Gait assistance: Min guard Gait Distance (Feet): 12 Feet (12'x2) Assistive device: Rolling walker (2 wheeled)       General Gait Details: Ambulated 12'x2 with seated rest break; Pt with DOE of 3/4 but sats 99%.  Cues for  RW   Stairs             Wheelchair Mobility    Modified Rankin (Stroke Patients Only)       Balance Overall balance assessment: Needs assistance Sitting-balance support: Feet supported Sitting balance-Leahy Scale: Good     Standing balance support: Bilateral upper extremity supported Standing balance-Leahy Scale: Poor Standing balance comment: REquiring RW                            Cognition Arousal/Alertness: Awake/alert Behavior During Therapy: WFL for tasks assessed/performed Overall Cognitive Status: Within Functional Limits for tasks assessed                                 General Comments: AxO x 3 very pleasant and aware of her current medical status.      Exercises General Exercises - Lower Extremity Ankle Circles/Pumps: AROM;20 reps;Both;Seated Long Arc Quad: AROM;Both;10 reps;Seated Hip Flexion/Marching: AROM;Both;10 reps;Seated Other Exercises Other Exercises: Hip add pillow squeeze and hip abd manual resistance in seated position x 10    General Comments        Pertinent Vitals/Pain Pain Assessment: No/denies pain    Home Living                      Prior Function            PT Goals (current goals can now be found in the  care plan section) Acute Rehab PT Goals Patient Stated Goal: to go on a cruise with family in October Progress towards PT goals: Progressing toward goals    Frequency    Min 3X/week      PT Plan Current plan remains appropriate    Co-evaluation              AM-PAC PT "6 Clicks" Mobility   Outcome Measure  Help needed turning from your back to your side while in a flat bed without using bedrails?: A Little Help needed moving from lying on your back to sitting on the side of a flat bed without using bedrails?: A Little Help needed moving to and from a bed to a chair (including a wheelchair)?: A Little Help needed standing up from a chair using your arms (e.g.,  wheelchair or bedside chair)?: A Little Help needed to walk in hospital room?: A Little Help needed climbing 3-5 steps with a railing? : A Lot 6 Click Score: 17    End of Session Equipment Utilized During Treatment: Gait belt Activity Tolerance: Patient tolerated treatment well Patient left: in chair;with call bell/phone within reach;with chair alarm set Nurse Communication: Mobility status PT Visit Diagnosis: Other abnormalities of gait and mobility (R26.89);Muscle weakness (generalized) (M62.81)     Time: [id number] PT Time Calculation (min) (ACUTE ONLY): 26 min  Charges:  $Gait Training: 8-22 mins $Therapeutic Exercise: 8-22 mins                     Abran Richard, PT Acute Rehab Services Pager [phone] [facility] 11/06/2020, 11:33 AM

## 2020-11-06 NOTE — Progress Notes (Signed)
PROGRESS NOTE  Beth Sanchez [id number] DOB: December 23, 1955 DOA: 10/05/2020 PCP: Pcp, No   LOS: 32 days   Brief Narrative / Interim history: 65 year old with history of HTN, DM2, liver cirrhosis, peritoneal carcinomatosis comes into the ED with back pain, abdominal distention, nausea and vomiting.  She was diagnosed with COVID in August and at that time she was found to have cirrhosis, ascites ultimately diagnosed with peritoneal carcinomatosis.  She underwent multiple paracentesis in 1 cycle of chemotherapy.  She had difficulties tolerating her first cycle of chemo.  Hospital course complicated by presumed septic shock from left lower lobe pneumonia, briefly requiring pressors, on underlying chronic hypotension.  She was eventually transferred out of the ICU.  Oncology consulted currently getting a second round of chemotherapy  Subjective / 24h Interval events: No abdominal pain, no nausea, no vomiting.  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  Oncology and palliative care consulted.  Status post second round of chemo 9/14 -Appreciate oncology follow-up -She has a peritoneal drain placed 9/2, most recently removed 1300 cc on 9/14, will remove again today and see how often which she will need drainage -Continue pain control with fentanyl patch, Dilaudid as needed  Active Problems Septic shock from left lower lobe pneumonia, chronic hypotension-she was briefly in the ICU requiring pressors.  Currently completed antibiotic course.  She is on midodrine as well as fludrocortisone for chronic hypotension  Acute DVT-p initially on heparin, now transition to Eliquis.  Discussed with Dr. Marin Olp.  No bleeding  AKI in the setting of shock-creatinine improved  Acute hypoxic respiratory failure on 9/1-due to pneumonia, completed antibiotics, currently on room air  Liver cirrhosis, malignant  ascites-monitor  Pancytopenia-due to chemotherapy, continue to monitor  Scheduled Meds:  apixaban  10 mg Oral BID   Followed by   Derrill Memo ON 11/12/2020] apixaban  5 mg Oral BID   feeding supplement  1 Container Oral TID BM   fentaNYL  1 patch Transdermal Q72H   fludrocortisone  0.4 mg Oral BID   lidocaine  15 mL Oral Once   lip balm   Topical Once   mouth rinse  15 mL Mouth Rinse BID   midodrine  20 mg Oral TID WC   pantoprazole  40 mg Oral BID   sodium chloride flush  10-40 mL Intracatheter Q12H   Continuous Infusions:  sodium chloride     sodium chloride Stopped (11/06/20 0735)   ondansetron (ZOFRAN) with dexamethasone (DECADRON) IV 10 mg (11/06/20 0734)   promethazine (PHENERGAN) injection (IM or IVPB) 12.5 mg (11/06/20 0939)   PRN Meds:.sodium chloride, acetaminophen (TYLENOL) oral liquid 160 mg/5 mL **OR** acetaminophen, guaiFENesin-dextromethorphan, HYDROmorphone (DILAUDID) injection, prochlorperazine, sodium chloride flush  Diet Orders (From admission, onward)     Start     Ordered   10/28/20 1123  Diet regular Room service appropriate? Yes; Fluid consistency: Thin  Diet effective now       Question Answer Comment  Room service appropriate? Yes   Fluid consistency: Thin      10/28/20 1123            DVT prophylaxis:  apixaban (ELIQUIS) tablet 10 mg  apixaban (ELIQUIS) tablet 5 mg     Code Status: DNR  Family Communication: No family at bedside  Status is: Inpatient  Remains inpatient appropriate because:IV treatments appropriate due to intensity of illness or inability to take PO and Inpatient level of care appropriate due to  severity of illness  Dispo: The patient is from: Home              Anticipated d/c is to: SNF              Patient currently is not medically stable to d/c.   Difficult to place patient No   Level of care: Med-Surg  Consultants:  Oncology, Dr. Marin Olp  Procedures:  none  Microbiology  none  Antimicrobials: none     Objective: Vitals:   11/05/20 0536 11/05/20 1358 11/05/20 2030 11/06/20 0539  BP: 112/83 110/79 108/76 107/72  Pulse: 82 79 75 70  Resp: '14 16 14 14  '$ Temp: (!) 97.4 F (36.3 C) 97.6 F (36.4 C) (!) 97.5 F (36.4 C) 97.7 F (36.5 C)  TempSrc: Oral Oral Oral Oral  SpO2: 98% 100% 100% 99%  Weight:      Height:        Intake/Output Summary (Last 24 hours) at 11/06/2020 1102 Last data filed at 11/06/2020 0856 Gross per 24 hour  Intake 1544.51 ml  Output --  Net 1544.51 ml    Filed Weights   10/08/20 1351 10/29/20 0500 11/03/20 1909  Weight: 87.7 kg 81.2 kg 77.7 kg    Examination:  Constitutional: No distress, eating breakfast Eyes: No scleral icterus ENMT: Moist mucous membranes Neck: normal, supple Respiratory: Lungs are clear bilaterally, no wheezing Cardiovascular: Regular rate and rhythm, no murmurs Abdomen: Soft, NT, ND, bowel sounds positive Musculoskeletal: no clubbing / cyanosis.  Skin: No rashes seen Neurologic: Nonfocal  Data Reviewed: I have independently reviewed following labs and imaging studies   CBC: Recent Labs  Lab 11/02/20 0320 11/03/20 0219 11/04/20 0505 11/05/20 0434 11/06/20 0434  WBC 4.0 3.9* 3.1* 2.5* 3.2*  NEUTROABS  --   --   --   --  2.7  HGB 10.3* 10.1* 10.7* 10.0* 10.0*  HCT 32.2* 32.0* 33.3* 31.4* 31.9*  MCV 84.3 84.9 84.3 85.3 85.1  PLT 92* 94* 111* 115* 141*    Basic Metabolic Panel: Recent Labs  Lab 11/02/20 0320 11/03/20 0219 11/04/20 0505 11/05/20 0434 11/06/20 0434  NA 136 134* 140 134* 139  K 3.8 3.7 3.7 3.7 3.9  CL 107 107 113* 109 115*  CO2 19* 20* 20* 18* 20*  GLUCOSE 73 75 79 125* 113*  BUN 32* 30* 26* 28* 28*  CREATININE 0.96 0.88 0.98 1.09* 0.97  CALCIUM 8.1* 7.8* 8.2* 7.5* 7.5*    Liver Function Tests: Recent Labs  Lab 11/02/20 0320 11/03/20 0219 11/04/20 0505 11/05/20 0434 11/06/20 0434  AST 14* 14* '16 15 15  '$ ALT '12 11 11 11 11  '$ ALKPHOS 138* 144* 160* 148* 152*  BILITOT 1.1 0.9 0.8  0.7 0.5  PROT 5.5* 5.5* 5.5* 5.3* 5.1*  ALBUMIN 2.2* 2.2* 2.3* 2.1* 2.2*    Coagulation Profile: No results for input(s): INR, PROTIME in the last 168 hours. HbA1C: No results for input(s): HGBA1C in the last 72 hours. CBG: No results for input(s): GLUCAP in the last 168 hours.   No results found for this or any previous visit (from the past 240 hour(s)).   Radiology Studies: No results found.  Marzetta Board, MD, PhD [facility]  Between 7 am - 7 pm I am available, please contact me via Amion (for emergencies) or Securechat (non urgent messages)  Between 7 pm - 7 am I am not available, please contact night coverage MD/APP via Amion

## 2020-11-06 NOTE — TOC Progression Note (Signed)
Transition of Care [facility]) - Progression Note    Patient Details  Name: Angiela Mullen MRN: [id number] Date of Birth: [birth year]  Transition of [facility] CM/SW Contact  Teri Legacy, Marjie Skiff, RN Phone Number: 11/06/2020, 2:02 PM  Clinical Narrative:    SNF bed offers provided to pt at the bedside along with sister [name] via phone. [facility] was chosen. [facility] alerted of desire for SNF bed. Auth started with [facility] [phone]. TOC will follow along and assist with DC to [facility]. [facility] liaison requesting that Pleurx supplies go with pt to SNF.    Expected Discharge Plan: [place] Barriers to Discharge: Continued Medical Work up  Expected Discharge Plan and Services Expected Discharge Plan: [place]   Discharge Planning Services: CM Consult   Living arrangements for the past 2 months: Single Family Home                   Readmission Risk Interventions Readmission Risk Prevention Plan 11/04/2020  Transportation Screening Complete  Medication Review Press photographer) Complete  HRI or Home Care Consult Complete  SW Recovery Care/Counseling Consult Complete  Palliative Care Screening Complete  Skilled Nursing Facility Complete  Some recent data might be hidden

## 2020-11-06 NOTE — Progress Notes (Signed)
SLP Cancellation Note  Patient Details Name: Beth Sanchez MRN: [id number] DOB: 03/10/1955   Cancelled treatment:       Reason Eval/Treat Not Completed: SLP screened, no needs identified, will sign off. Checked in with pt, She reports she has been tolerating meals, still struggling with nausea and reflux. We discussed this briefly and determined no SLP f/u needed.    Cooper Moroney, Katherene Ponto 11/06/2020, 11:37 AM

## 2020-11-07 DIAGNOSIS — C482 Malignant neoplasm of peritoneum, unspecified: Secondary | ICD-10-CM | POA: Diagnosis not present

## 2020-11-07 NOTE — Progress Notes (Signed)
Looks like Beth Sanchez will be going to [facility] for rehabilitation.  She had a 1000 cc of fluid taken off the abdomen yesterday.  She feels a little bit down today.  I suspect this probably is from the chemotherapy.  She has had no vomiting.  She is not as hungry.  There is been no bleeding.  She has the blood clot in the left leg.  She is on Eliquis.  There are no labs back yet on her.  There is no cough or shortness of breath.  She has had no fever.  Her vital signs are temperature of 97.5.  Pulse 72.  Blood pressure 103/71.  Her abdomen is soft.  Bowel sounds are decreased.  There is no obvious fluid wave.  There is no guarding or rebound tenderness.  Lungs are clear.  Cardiac exam regular rate and rhythm.  Extremities shows some slight swelling in the left leg.  There is no tenderness to palpation.  Hopefully, [name] will be able to go to [facility] next week.  Hopefully they will be able to take fluid out of the catheter in the abdomen.  I have to believe that she will continue to respond to treatment.  I know we have a long way to go.  It is all about her nutrition.  Would be nice if she was able to take in a little more nutrition wise.  I know that she has had incredible care from all the staff upon [address].  I appreciate their compassion.  Lattie Haw, MD  Psalm 27:4

## 2020-11-07 NOTE — Progress Notes (Signed)
PROGRESS NOTE  Beth Sanchez [id number] DOB: 11-May-1955 DOA: 10/05/2020 PCP: Pcp, No   LOS: 33 days   Brief Narrative / Interim history: 65 year old with history of HTN, DM2, liver cirrhosis, peritoneal carcinomatosis comes into the ED with back pain, abdominal distention, nausea and vomiting.  She was diagnosed with COVID in August and at that time she was found to have cirrhosis, ascites ultimately diagnosed with peritoneal carcinomatosis.  She underwent multiple paracentesis in 1 cycle of chemotherapy.  She had difficulties tolerating her first cycle of chemo.  Hospital course complicated by presumed septic shock from left lower lobe pneumonia, briefly requiring pressors, on underlying chronic hypotension.  She was eventually transferred out of the ICU.  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  Oncology and palliative care consulted.  Status post second round of chemo 9/14 -Appreciate oncology follow-up -She has a peritoneal drain placed 9/2, most recently removed 1300 cc on 9/14, and 1000 cc on 9/16.  Drain again on Monday -Continue pain control with fentanyl patch, Dilaudid as needed  Active Problems Septic shock from left lower lobe pneumonia, chronic hypotension-she was briefly in the ICU requiring pressors.  Currently completed antibiotic course.  She is on midodrine as well as fludrocortisone for chronic hypotension.  Stable  Acute DVT-p initially on heparin, now transition to Eliquis.  Discussed with Dr. Marin Olp.  No bleeding, hemoglobin stable  AKI in the setting of shock-creatinine improved  Acute hypoxic respiratory failure on 9/1-due to pneumonia, completed antibiotics, currently on room air  Liver cirrhosis, malignant ascites-monitor  Pancytopenia-due  to chemotherapy, continue to monitor, improving  Scheduled Meds:  apixaban  10 mg Oral BID   Followed by   Derrill Memo ON 11/12/2020] apixaban  5 mg Oral BID   feeding supplement  1 Container Oral TID BM   fentaNYL  1 patch Transdermal Q72H   fludrocortisone  0.4 mg Oral BID   lidocaine  15 mL Oral Once   lip balm   Topical Once   mouth rinse  15 mL Mouth Rinse BID   midodrine  20 mg Oral TID WC   pantoprazole  40 mg Oral BID   sodium chloride flush  10-40 mL Intracatheter Q12H   Continuous Infusions:  sodium chloride     sodium chloride 50 mL/hr at 11/07/20 0303   promethazine (PHENERGAN) injection (IM or IVPB) 12.5 mg (11/07/20 0828)   PRN Meds:.sodium chloride, acetaminophen (TYLENOL) oral liquid 160 mg/5 mL **OR** acetaminophen, guaiFENesin-dextromethorphan, HYDROmorphone (DILAUDID) injection, prochlorperazine, sodium chloride flush  Diet Orders (From admission, onward)     Start     Ordered   10/28/20 1123  Diet regular Room service appropriate? Yes; Fluid consistency: Thin  Diet effective now       Question Answer Comment  Room service appropriate? Yes   Fluid consistency: Thin      10/28/20 1123            DVT prophylaxis:  apixaban (ELIQUIS) tablet 10 mg  apixaban (ELIQUIS) tablet 5 mg     Code Status: DNR  Family Communication: No family at bedside  Status is: Inpatient  Remains inpatient appropriate because:IV treatments appropriate due to intensity of illness or inability to take PO and Inpatient level of care appropriate due to severity of illness  Dispo: The patient is from: Home  Anticipated d/c is to: SNF              Patient currently is not medically stable to d/c.   Difficult to place patient No   Level of care: Med-Surg  Consultants:  Oncology, Dr. Marin Olp  Procedures:  none  Microbiology  none  Antimicrobials: none    Objective: Vitals:   11/06/20 1300 11/06/20 1512 11/06/20 2204 11/07/20 0432  BP:  103/77 112/78  103/71  Pulse:  77 64 72  Resp:  '16 17 17  '$ Temp:  (!) 97.4 F (36.3 C) (!) 97.5 F (36.4 C) (!) 97.5 F (36.4 C)  TempSrc:  Oral Oral Oral  SpO2:  98% 100% 100%  Weight: 79.2 kg     Height:        Intake/Output Summary (Last 24 hours) at 11/07/2020 1134 Last data filed at 11/07/2020 1000 Gross per 24 hour  Intake 1479.24 ml  Output --  Net 1479.24 ml    Filed Weights   10/29/20 0500 11/03/20 1909 11/06/20 1300  Weight: 81.2 kg 77.7 kg 79.2 kg    Examination:  Constitutional: NAD, in bed Eyes: Anicteric ENMT: Moist membranes Neck: normal, supple Respiratory: Clear bilaterally, no wheezing Cardiovascular: Regular rate and rhythm, no murmurs Abdomen: Soft, nontender, nondistended, positive bowel sounds Musculoskeletal: no clubbing / cyanosis.  Skin: No rashes seen Neurologic: No focal deficits  Data Reviewed: I have independently reviewed following labs and imaging studies   CBC: Recent Labs  Lab 11/02/20 0320 11/03/20 0219 11/04/20 0505 11/05/20 0434 11/06/20 0434  WBC 4.0 3.9* 3.1* 2.5* 3.2*  NEUTROABS  --   --   --   --  2.7  HGB 10.3* 10.1* 10.7* 10.0* 10.0*  HCT 32.2* 32.0* 33.3* 31.4* 31.9*  MCV 84.3 84.9 84.3 85.3 85.1  PLT 92* 94* 111* 115* 141*    Basic Metabolic Panel: Recent Labs  Lab 11/02/20 0320 11/03/20 0219 11/04/20 0505 11/05/20 0434 11/06/20 0434  NA 136 134* 140 134* 139  K 3.8 3.7 3.7 3.7 3.9  CL 107 107 113* 109 115*  CO2 19* 20* 20* 18* 20*  GLUCOSE 73 75 79 125* 113*  BUN 32* 30* 26* 28* 28*  CREATININE 0.96 0.88 0.98 1.09* 0.97  CALCIUM 8.1* 7.8* 8.2* 7.5* 7.5*    Liver Function Tests: Recent Labs  Lab 11/02/20 0320 11/03/20 0219 11/04/20 0505 11/05/20 0434 11/06/20 0434  AST 14* 14* '16 15 15  '$ ALT '12 11 11 11 11  '$ ALKPHOS 138* 144* 160* 148* 152*  BILITOT 1.1 0.9 0.8 0.7 0.5  PROT 5.5* 5.5* 5.5* 5.3* 5.1*  ALBUMIN 2.2* 2.2* 2.3* 2.1* 2.2*    Coagulation Profile: No results for input(s): INR, PROTIME in the  last 168 hours. HbA1C: No results for input(s): HGBA1C in the last 72 hours. CBG: No results for input(s): GLUCAP in the last 168 hours.   No results found for this or any previous visit (from the past 240 hour(s)).   Radiology Studies: No results found.  Marzetta Board, MD, PhD [facility]  Between 7 am - 7 pm I am available, please contact me via Amion (for emergencies) or Securechat (non urgent messages)  Between 7 pm - 7 am I am not available, please contact night coverage MD/APP via Amion

## 2020-11-07 NOTE — Plan of Care (Addendum)
Able to manage nausea and abdominal pain with prn compazine, IV dilaudid and scheduled phenergan. Pt was able to transfer to chair and have lunch brought in by sister. Skin is covered by scabs, scratches and abrasions mostly self inflicted d/t itching. Pt also refusing PO meds. Dr. Cruzita Lederer made aware. Education provided.   Problem: Education: Goal: Knowledge of General Education information will improve Description: Including pain rating scale, medication(s)/side effects and non-pharmacologic comfort measures Outcome: Progressing   Problem: Health Behavior/Discharge Planning: Goal: Ability to manage health-related needs will improve Outcome: Progressing   Problem: Clinical Measurements: Goal: Ability to maintain clinical measurements within normal limits will improve Outcome: Progressing Goal: Will remain free from infection Outcome: Progressing Goal: Diagnostic test results will improve Outcome: Progressing Goal: Respiratory complications will improve Outcome: Progressing Goal: Cardiovascular complication will be avoided Outcome: Progressing   Problem: Activity: Goal: Risk for activity intolerance will decrease Outcome: Progressing   Problem: Nutrition: Goal: Adequate nutrition will be maintained Outcome: Progressing   Problem: Coping: Goal: Level of anxiety will decrease Outcome: Progressing   Problem: Elimination: Goal: Will not experience complications related to bowel motility Outcome: Progressing Goal: Will not experience complications related to urinary retention Outcome: Progressing   Problem: Pain Managment: Goal: General experience of comfort will improve Outcome: Progressing   Problem: Safety: Goal: Ability to remain free from injury will improve Outcome: Progressing   Problem: Skin Integrity: Goal: Risk for impaired skin integrity will decrease Outcome: Progressing

## 2020-11-08 DIAGNOSIS — C482 Malignant neoplasm of peritoneum, unspecified: Secondary | ICD-10-CM | POA: Diagnosis not present

## 2020-11-08 LAB — CBC WITH DIFFERENTIAL/PLATELET
Abs Immature Granulocytes: 0.03 10*3/uL (ref 0.00–0.07)
Basophils Absolute: 0 10*3/uL (ref 0.0–0.1)
Basophils Relative: 0 %
Eosinophils Absolute: 0 10*3/uL (ref 0.0–0.5)
Eosinophils Relative: 0 %
HCT: 31.4 % — ABNORMAL LOW (ref 36.0–46.0)
Hemoglobin: 10.3 g/dL — ABNORMAL LOW (ref 12.0–15.0)
Immature Granulocytes: 1 %
Lymphocytes Relative: 7 %
Lymphs Abs: 0.2 10*3/uL — ABNORMAL LOW (ref 0.7–4.0)
MCH: 27.6 pg (ref 26.0–34.0)
MCHC: 32.8 g/dL (ref 30.0–36.0)
MCV: 84.2 fL (ref 80.0–100.0)
Monocytes Absolute: 0.2 10*3/uL (ref 0.1–1.0)
Monocytes Relative: 5 %
Neutro Abs: 2.8 10*3/uL (ref 1.7–7.7)
Neutrophils Relative %: 87 %
Platelets: 166 10*3/uL (ref 150–400)
RBC: 3.73 MIL/uL — ABNORMAL LOW (ref 3.87–5.11)
RDW: 18.7 % — ABNORMAL HIGH (ref 11.5–15.5)
WBC: 3.2 10*3/uL — ABNORMAL LOW (ref 4.0–10.5)
nRBC: 0 % (ref 0.0–0.2)

## 2020-11-08 LAB — COMPREHENSIVE METABOLIC PANEL
ALT: 13 U/L (ref 0–44)
AST: 25 U/L (ref 15–41)
Albumin: 2 g/dL — ABNORMAL LOW (ref 3.5–5.0)
Alkaline Phosphatase: 161 U/L — ABNORMAL HIGH (ref 38–126)
Anion gap: 5 (ref 5–15)
BUN: 30 mg/dL — ABNORMAL HIGH (ref 8–23)
CO2: 19 mmol/L — ABNORMAL LOW (ref 22–32)
Calcium: 7.1 mg/dL — ABNORMAL LOW (ref 8.9–10.3)
Chloride: 115 mmol/L — ABNORMAL HIGH (ref 98–111)
Creatinine, Ser: 0.69 mg/dL (ref 0.44–1.00)
GFR, Estimated: >60 mL/min (ref >60)
Glucose, Bld: 83 mg/dL (ref 70–99)
Potassium: 3.3 mmol/L — ABNORMAL LOW (ref 3.5–5.1)
Sodium: 139 mmol/L (ref 135–145)
Total Bilirubin: 0.6 mg/dL (ref 0.3–1.2)
Total Protein: 4.7 g/dL — ABNORMAL LOW (ref 6.5–8.1)

## 2020-11-08 MED ORDER — POTASSIUM CHLORIDE 10 MEQ/100ML IV SOLN
10.0000 meq | INTRAVENOUS | Status: AC
  Administered 2020-11-08 (×3): 10 meq via INTRAVENOUS
  Filled 2020-11-08: qty 100

## 2020-11-08 MED ORDER — POTASSIUM CHLORIDE CRYS ER 20 MEQ PO TBCR: 40.0000 meq | EXTENDED_RELEASE_TABLET | Freq: Once | ORAL | Status: DC

## 2020-11-08 NOTE — TOC Progression Note (Addendum)
Transition of Care [facility]) - Progression Note    Patient Details  Name: Beth Sanchez MRN: [id number] Date of Birth: 12-21-1955  Transition of Care [facility]) CM/SW Contact  Gayl Ivanoff, Juliann Pulse, RN Phone Number: 11/08/2020, 11:43 AM  Clinical Narrative: [facility] about [facility] auth:   Josem Kaufmann [id number] from 9/19(tomorrow until 9/21) f/u on plan auth ID#.MD to order covid. Noted Pleurx cannisters to be ordered if not d/c prior d/c. Patient covid+ from 8/15-[facility] rep Narda Rutherford is already aware-no need to retest. MD notified.  Expected Discharge Plan: Skilled Nursing Facility Barriers to Discharge: Continued Medical Work up  Expected Discharge Plan and Services Expected Discharge Plan: [place]   Discharge Planning Services: CM Consult   Living arrangements for the past 2 months: Single Family Home                                       Social Determinants of Health (SDOH) Interventions    Readmission Risk Interventions Readmission Risk Prevention Plan 11/04/2020  Transportation Screening Complete  Medication Review Press photographer) Complete  HRI or Home Care Consult Complete  SW Recovery Care/Counseling Consult Complete  Palliative Care Screening Complete  Skilled Nursing Facility Complete  Some recent data might be hidden

## 2020-11-08 NOTE — Progress Notes (Signed)
PROGRESS NOTE  Beth Sanchez [id number] DOB: Dec 10, 1955 DOA: 10/05/2020 PCP: Pcp, No   LOS: 34 days   Brief Narrative / Interim history: 65 year old with history of HTN, DM2, liver cirrhosis, peritoneal carcinomatosis comes into the ED with back pain, abdominal distention, nausea and vomiting.  She was diagnosed with COVID in August and at that time she was found to have cirrhosis, ascites ultimately diagnosed with peritoneal carcinomatosis.  She underwent multiple paracentesis in 1 cycle of chemotherapy.  She had difficulties tolerating her first cycle of chemo.  Hospital course complicated by presumed septic shock from left lower lobe pneumonia, briefly requiring pressors, on underlying chronic hypotension.  She was eventually transferred out of the ICU.  Oncology consulted currently getting a second round of chemotherapy  Subjective / 24h Interval events: Feeling weak, no other significant complaints.  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  Oncology and palliative care consulted.  Status post second round of chemo 9/14 -Appreciate oncology follow-up -She has a peritoneal drain placed 9/2, most recently removed 1300 cc on 9/14, and 1000 cc on 9/16.  Drain again on Monday -Continue pain control with fentanyl patch, Dilaudid as needed  Active Problems Septic shock from left lower lobe pneumonia, chronic hypotension-she was briefly in the ICU requiring pressors.  Currently completed antibiotic course.  She is on midodrine as well as fludrocortisone for chronic hypotension.  Stable  Acute DVT-p initially on heparin, now transition to Eliquis.  Discussed with Dr. Marin Olp.  No bleeding, hemoglobin stable  AKI in the setting of shock-creatinine improved  Acute hypoxic respiratory failure on 9/1-due to pneumonia,  completed antibiotics, currently on room air  Liver cirrhosis, malignant ascites-monitor  Pancytopenia-due to chemotherapy, continue to monitor, improving  Scheduled Meds:  apixaban  10 mg Oral BID   Followed by   Derrill Memo ON 11/12/2020] apixaban  5 mg Oral BID   feeding supplement  1 Container Oral TID BM   fentaNYL  1 patch Transdermal Q72H   fludrocortisone  0.4 mg Oral BID   lidocaine  15 mL Oral Once   lip balm   Topical Once   mouth rinse  15 mL Mouth Rinse BID   midodrine  20 mg Oral TID WC   pantoprazole  40 mg Oral BID   sodium chloride flush  10-40 mL Intracatheter Q12H   Continuous Infusions:  sodium chloride     sodium chloride 50 mL/hr at 11/08/20 0537   potassium chloride 10 mEq (11/08/20 1124)   promethazine (PHENERGAN) injection (IM or IVPB) 12.5 mg (11/08/20 1018)   PRN Meds:.sodium chloride, acetaminophen (TYLENOL) oral liquid 160 mg/5 mL **OR** acetaminophen, guaiFENesin-dextromethorphan, HYDROmorphone (DILAUDID) injection, prochlorperazine, sodium chloride flush  Diet Orders (From admission, onward)     Start     Ordered   10/28/20 1123  Diet regular Room service appropriate? Yes; Fluid consistency: Thin  Diet effective now       Question Answer Comment  Room service appropriate? Yes   Fluid consistency: Thin      10/28/20 1123            DVT prophylaxis:  apixaban (ELIQUIS) tablet 10 mg  apixaban (ELIQUIS) tablet 5 mg     Code Status: DNR  Family Communication: No family at bedside  Status is: Inpatient  Remains inpatient appropriate because:IV treatments appropriate due to intensity of illness  or inability to take PO and Inpatient level of care appropriate due to severity of illness  Dispo: The patient is from: Home              Anticipated d/c is to: SNF              Patient currently is not medically stable to d/c.   Difficult to place patient No   Level of care: Med-Surg  Consultants:  Oncology, Dr. Marin Olp  Procedures:   none  Microbiology  none  Antimicrobials: none    Objective: Vitals:   11/07/20 0432 11/07/20 1315 11/07/20 2037 11/08/20 0533  BP: 1'03/71 90/65 94/67 '$ 97/72  Pulse: 72 97 97 89  Resp: '17 19 19 17  '$ Temp: (!) 97.5 F (36.4 C) (!) 97.5 F (36.4 C) (!) 97.5 F (36.4 C) 97.8 F (36.6 C)  TempSrc: Oral Oral Oral Oral  SpO2: 100% 99% 96% 99%  Weight:      Height:        Intake/Output Summary (Last 24 hours) at 11/08/2020 1155 Last data filed at 11/08/2020 1000 Gross per 24 hour  Intake 1744.62 ml  Output --  Net 1744.62 ml    Filed Weights   10/29/20 0500 11/03/20 1909 11/06/20 1300  Weight: 81.2 kg 77.7 kg 79.2 kg    Examination:  Constitutional: No distress Respiratory: Clear bilaterally Cardiovascular: Regular rate and rhythm   Data Reviewed: I have independently reviewed following labs and imaging studies   CBC: Recent Labs  Lab 11/03/20 0219 11/04/20 0505 11/05/20 0434 11/06/20 0434 11/08/20 0430  WBC 3.9* 3.1* 2.5* 3.2* 3.2*  NEUTROABS  --   --   --  2.7 2.8  HGB 10.1* 10.7* 10.0* 10.0* 10.3*  HCT 32.0* 33.3* 31.4* 31.9* 31.4*  MCV 84.9 84.3 85.3 85.1 84.2  PLT 94* 111* 115* 141* [id number]    Basic Metabolic Panel: Recent Labs  Lab 11/03/20 0219 11/04/20 0505 11/05/20 0434 11/06/20 0434 11/08/20 0430  NA 134* 140 134* 139 139  K 3.7 3.7 3.7 3.9 3.3*  CL 107 113* 109 115* 115*  CO2 20* 20* 18* 20* 19*  GLUCOSE 75 79 125* 113* 83  BUN 30* 26* 28* 28* 30*  CREATININE 0.88 0.98 1.09* 0.97 0.69  CALCIUM 7.8* 8.2* 7.5* 7.5* 7.1*    Liver Function Tests: Recent Labs  Lab 11/03/20 0219 11/04/20 0505 11/05/20 0434 11/06/20 0434 11/08/20 0430  AST 14* '16 15 15 25  '$ ALT '11 11 11 11 13  '$ ALKPHOS 144* 160* 148* 152* 161*  BILITOT 0.9 0.8 0.7 0.5 0.6  PROT 5.5* 5.5* 5.3* 5.1* 4.7*  ALBUMIN 2.2* 2.3* 2.1* 2.2* 2.0*    Coagulation Profile: No results for input(s): INR, PROTIME in the last 168 hours. HbA1C: No results for input(s): HGBA1C in  the last 72 hours. CBG: No results for input(s): GLUCAP in the last 168 hours.   No results found for this or any previous visit (from the past 240 hour(s)).   Radiology Studies: No results found.  Marzetta Board, MD, PhD [facility]  Between 7 am - 7 pm I am available, please contact me via Amion (for emergencies) or Securechat (non urgent messages)  Between 7 pm - 7 am I am not available, please contact night coverage MD/APP via Amion

## 2020-11-08 NOTE — Plan of Care (Signed)
No acute changes. Hair washed. Ambulated with walker to bathroom. Pt refused all po meds except eliquis. Turns self in bed. PRN pain PRN antiemetic given. VS unremarkable. Ate breakfast and lunch with sister. Looking forward to going home.   Problem: Education: Goal: Knowledge of General Education information will improve Description: Including pain rating scale, medication(s)/side effects and non-pharmacologic comfort measures Outcome: Progressing   Problem: Health Behavior/Discharge Planning: Goal: Ability to manage health-related needs will improve Outcome: Progressing   Problem: Clinical Measurements: Goal: Ability to maintain clinical measurements within normal limits will improve Outcome: Progressing Goal: Will remain free from infection Outcome: Progressing Goal: Diagnostic test results will improve Outcome: Progressing Goal: Respiratory complications will improve Outcome: Progressing Goal: Cardiovascular complication will be avoided Outcome: Progressing   Problem: Activity: Goal: Risk for activity intolerance will decrease Outcome: Progressing   Problem: Nutrition: Goal: Adequate nutrition will be maintained Outcome: Progressing   Problem: Coping: Goal: Level of anxiety will decrease Outcome: Progressing   Problem: Elimination: Goal: Will not experience complications related to bowel motility Outcome: Progressing Goal: Will not experience complications related to urinary retention Outcome: Progressing   Problem: Pain Managment: Goal: General experience of comfort will improve Outcome: Progressing   Problem: Safety: Goal: Ability to remain free from injury will improve Outcome: Progressing   Problem: Skin Integrity: Goal: Risk for impaired skin integrity will decrease Outcome: Progressing

## 2020-11-09 DIAGNOSIS — N179 Acute kidney failure, unspecified: Secondary | ICD-10-CM | POA: Diagnosis not present

## 2020-11-09 DIAGNOSIS — G893 Neoplasm related pain (acute) (chronic): Secondary | ICD-10-CM | POA: Diagnosis not present

## 2020-11-09 DIAGNOSIS — C482 Malignant neoplasm of peritoneum, unspecified: Secondary | ICD-10-CM | POA: Diagnosis not present

## 2020-11-09 LAB — COMPREHENSIVE METABOLIC PANEL
ALT: 13 U/L (ref 0–44)
AST: 27 U/L (ref 15–41)
Albumin: 2 g/dL — ABNORMAL LOW (ref 3.5–5.0)
Alkaline Phosphatase: 149 U/L — ABNORMAL HIGH (ref 38–126)
Anion gap: 6 (ref 5–15)
BUN: 26 mg/dL — ABNORMAL HIGH (ref 8–23)
CO2: 20 mmol/L — ABNORMAL LOW (ref 22–32)
Calcium: 6.9 mg/dL — ABNORMAL LOW (ref 8.9–10.3)
Chloride: 111 mmol/L (ref 98–111)
Creatinine, Ser: 0.91 mg/dL (ref 0.44–1.00)
GFR, Estimated: >60 mL/min (ref >60)
Glucose, Bld: 79 mg/dL (ref 70–99)
Potassium: 3.2 mmol/L — ABNORMAL LOW (ref 3.5–5.1)
Sodium: 137 mmol/L (ref 135–145)
Total Bilirubin: 0.9 mg/dL (ref 0.3–1.2)
Total Protein: 4.6 g/dL — ABNORMAL LOW (ref 6.5–8.1)

## 2020-11-09 LAB — CBC WITH DIFFERENTIAL/PLATELET
Abs Immature Granulocytes: 0.03 10*3/uL (ref 0.00–0.07)
Basophils Absolute: 0 10*3/uL (ref 0.0–0.1)
Basophils Relative: 0 %
Eosinophils Absolute: 0 10*3/uL (ref 0.0–0.5)
Eosinophils Relative: 0 %
HCT: 29.2 % — ABNORMAL LOW (ref 36.0–46.0)
Hemoglobin: 9.5 g/dL — ABNORMAL LOW (ref 12.0–15.0)
Immature Granulocytes: 1 %
Lymphocytes Relative: 5 %
Lymphs Abs: 0.2 10*3/uL — ABNORMAL LOW (ref 0.7–4.0)
MCH: 27.1 pg (ref 26.0–34.0)
MCHC: 32.5 g/dL (ref 30.0–36.0)
MCV: 83.2 fL (ref 80.0–100.0)
Monocytes Absolute: 0.1 10*3/uL (ref 0.1–1.0)
Monocytes Relative: 3 %
Neutro Abs: 2.6 10*3/uL (ref 1.7–7.7)
Neutrophils Relative %: 91 %
Platelets: 171 10*3/uL (ref 150–400)
RBC: 3.51 MIL/uL — ABNORMAL LOW (ref 3.87–5.11)
RDW: 18.7 % — ABNORMAL HIGH (ref 11.5–15.5)
WBC: 2.8 10*3/uL — ABNORMAL LOW (ref 4.0–10.5)
nRBC: 0 % (ref 0.0–0.2)

## 2020-11-09 MED ORDER — HEPARIN SOD (PORK) LOCK FLUSH 10 UNIT/ML IV SOLN
10.0000 [IU] | Freq: Once | INTRAVENOUS | Status: DC
  Filled 2020-11-09: qty 1

## 2020-11-09 MED ORDER — APIXABAN 5 MG PO TABS: 5.0000 mg | ORAL_TABLET | Freq: Two times a day (BID) | ORAL | Status: AC

## 2020-11-09 MED ORDER — APIXABAN 5 MG PO TABS: 10.0000 mg | ORAL_TABLET | Freq: Two times a day (BID) | ORAL | Status: DC

## 2020-11-09 MED ORDER — FENTANYL 12 MCG/HR TD PT72: 1.0000 | MEDICATED_PATCH | TRANSDERMAL | 0 refills | Status: AC

## 2020-11-09 MED ORDER — APIXABAN 5 MG PO TABS: 10.0000 mg | ORAL_TABLET | Freq: Two times a day (BID) | ORAL | Status: AC

## 2020-11-09 MED ORDER — POTASSIUM CHLORIDE 10 MEQ/100ML IV SOLN
10.0000 meq | INTRAVENOUS | Status: AC
  Administered 2020-11-09 (×4): 10 meq via INTRAVENOUS
  Filled 2020-11-09: qty 100

## 2020-11-09 MED ORDER — OXYCODONE HCL 5 MG PO TABS: 5.0000 mg | ORAL_TABLET | ORAL | 0 refills | Status: AC | PRN

## 2020-11-09 MED ORDER — FLUDROCORTISONE ACETATE 0.1 MG PO TABS: 0.4000 mg | ORAL_TABLET | Freq: Two times a day (BID) | ORAL | Status: AC

## 2020-11-09 MED ORDER — MIDODRINE HCL 10 MG PO TABS: 10.0000 mg | ORAL_TABLET | Freq: Three times a day (TID) | ORAL | Status: AC

## 2020-11-09 MED ORDER — INFLUENZA VAC A&B SA ADJ QUAD 0.5 ML IM PRSY
0.5000 mL | PREFILLED_SYRINGE | Freq: Once | INTRAMUSCULAR | Status: AC
  Administered 2020-11-09: 0.5 mL via INTRAMUSCULAR
  Filled 2020-11-09: qty 0.5

## 2020-11-09 NOTE — Progress Notes (Signed)
Hopefully, Ms. Burdge will go to rehab today.  I do not see any reason why she could not from my point of view.  Her lab work shows a white cell count 2.8.  Hemoglobin 9.5.  Platelet count 171,000.  Her BUN is 26 creatinine 0.91.  Her albumin is still quite low at 2.0.  She did not have a bowel movement yesterday.  Is hard to really know how much she really is eating.  There is no vomiting.  There is no bleeding.  She is on Eliquis for the thrombus in her left leg.  Her vital signs show temperature of 98.  Pulse 99.  Blood pressure 106/74.  Her abdomen is soft.  Bowel sounds are decreased but present.  Lungs are clear.  Cardiac exam regular rate and rhythm.  Extremities does show some swelling in the left leg but mild.  Neurological exam is nonfocal.  Again, hopefully Ms. Morrissey will go to rehab today.  I think she is a lot of rehabilitation.  Hopefully, she will continue to strengthen.  Hopefully she will continue to eat better.  Again nutrition clearly is her problem.  We we will see how much fluid that comes off her abdomen.  It would be nice to try to remove some ascites before she goes home.  I do appreciate the great care that she is gotten from everybody on 6 E.  Lattie Haw, MD  Darlyn Chamber 29:11

## 2020-11-09 NOTE — Discharge Summary (Addendum)
Physician Discharge Summary  Beth Sanchez [id number] DOB: Oct 20, 1955 DOA: 10/05/2020  PCP: Pcp, No  Admit date: 10/05/2020 Discharge date: 11/09/2020  Admitted From: home Disposition:  SNF  Recommendations for Outpatient Follow-up:  Follow up with Dr. Marin Olp as scheduled Please obtain CBC/BMP in 3 to 4 days Drain Pleurx catheter every Monday, Wednesday and Friday. Continue Eliquis 10 mg twice daily for 2 more days, and on 9/22 change dose to 5 mg twice daily  Home Health: None Equipment/Devices: None  Discharge Condition: Stable CODE STATUS: DNR Diet recommendation: Regular  HPI: Per admitting MD, Beth Sanchez is a 65 y.o. female with medical history significant of HTN, IIDM, presented with worsening of abdominal pain and distention. Patient started with develop abdominal distention and leg swelling about 1 week ago, gradually getting worse.  Before that, about 1 month ago, patient went to see her PCP, who cut down her blood pressure medication ACE inhibitor to one half regular doses due to " kidney function elevation". Over the weekend, she also developed " soreness like abdominal pain" for her abdomen has so distended, she also feels lower back pain and fatigue.  She denied any fever chills, and yesterday she vomited 2 times of stomach content and had loose bowel movement x1.  She used to drink alcohol but quit 30 years ago.  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  Oncology and palliative care consulted.  Patient has received chemotherapy while hospitalized, her second round was on 9/14.  Dr. Marin Olp will follow patient as an outpatient to continue chemotherapy.  She has had a Pleurx drain placed for her malignant ascites which we have been draining every Monday, Wednesday and Friday.  Most recent drainage was done 9/19 with only 650 cc removed which showed improvement  compared to prior week.  Continue pain control with fentanyl patch and oxycodone.   Active Problems Septic shock from left lower lobe pneumonia, chronic hypotension-she was briefly in the ICU requiring pressors.  Currently completed antibiotic course.  She is on midodrine as well as fludrocortisone for chronic hypotension.  Stable.  Monitor blood pressure and wean off midodrine/fludrocortisone as tolerated.  She was on lisinopril/HCTZ for hypertension in the past which is now being discontinued  Acute left lower extremity DVT-she is tolerating Eliquis, continue 10 mg twice daily for 2 more days and then on 9/22 changed to 5 mg twice daily AKI in the setting of shock-creatinine improved Acute hypoxic respiratory failure on 9/1-due to pneumonia, completed antibiotics, currently on room air Liver cirrhosis, malignant ascites-monitor Pancytopenia-due to chemotherapy, continue to monitor, improving Recent Covid, mid August 2022-recovered well  Sepsis ruled out   Discharge Instructions  Discharge Instructions     TREATMENT CONDITIONS   Complete by: As directed    Patient should have CBC & CMP within 7 days prior to chemotherapy administration. NOTIFY MD IF: ANC < 1500, Hemoglobin < 8, PLT < 100,000,  Total Bili > 1.5, Creatinine > 1.5, ALT & AST > 80 or if patient has unstable vital signs: Temperature > 38.5, SBP > 180 or < 90, RR > 30 or HR > 100.   TREATMENT CONDITIONS   Complete by: As directed    Patient should have CBC & CMP within 7 days prior to chemotherapy administration. NOTIFY MD IF: ANC < 1500, Hemoglobin < 8, PLT < 100,000,  Total Bili > 1.5, Creatinine > 1.5, ALT & AST > 80 or if patient has  unstable vital signs: Temperature > 38.5, SBP > 180 or < 90, RR > 30 or HR > 100.      Allergies as of 11/09/2020   No Known Allergies      Medication List     STOP taking these medications    lisinopril-hydrochlorothiazide 10-12.5 MG tablet Commonly known as: ZESTORETIC        TAKE these medications    acetaminophen 500 MG tablet Commonly known as: TYLENOL Take 500 mg by mouth every 6 (six) hours as needed for moderate pain.   apixaban 5 MG Tabs tablet Commonly known as: ELIQUIS Take 2 tablets (10 mg total) by mouth 2 (two) times daily for 2 days.   apixaban 5 MG Tabs tablet Commonly known as: ELIQUIS Take 1 tablet (5 mg total) by mouth 2 (two) times daily. Start 9/22 Start taking on: November 12, 2020   empagliflozin 10 MG Tabs tablet Commonly known as: JARDIANCE Take 1 tablet (10 mg total) by mouth daily.   fentaNYL 12 MCG/HR Commonly known as: [place] 1 patch onto the skin every 3 (three) days. Start taking on: November 10, 2020   fludrocortisone 0.1 MG tablet Commonly known as: FLORINEF Take 4 tablets (0.4 mg total) by mouth 2 (two) times daily.   metFORMIN 500 MG tablet Commonly known as: GLUCOPHAGE Take 1 tablet (500 mg total) by mouth daily with breakfast.   midodrine 10 MG tablet Commonly known as: PROAMATINE Take 1 tablet (10 mg total) by mouth 3 (three) times daily with meals.   omeprazole 20 MG capsule Commonly known as: PRILOSEC Take 1 capsule (20 mg total) by mouth daily.   oxyCODONE 5 MG immediate release tablet Commonly known as: Roxicodone Take 1 tablet (5 mg total) by mouth every 4 (four) hours as needed for severe pain.   simvastatin 20 MG tablet Commonly known as: ZOCOR Take 20 mg by mouth daily.         Consultations: Oncology GI Palliative Critical care   Procedures/Studies: EGD 8/18  DG Abd 1 View  Result Date: 10/22/2020 CLINICAL DATA:  Shaft.  Abdominal pain EXAM: ABDOMEN - 1 VIEW COMPARISON:  Two days ago FINDINGS: Unchanged enteric contrast in the ascending more than descending colon. Mild gaseous distention of the stomach. No concerning mass effect or calcification. IMPRESSION: Nonobstructive bowel gas pattern that is unchanged from 2 days ago. No progression of enteric contrast.  Electronically Signed   By: Monte Fantasia M.D.   On: 10/22/2020 04:24   US RENAL  Result Date: 10/19/2020 CLINICAL DATA:  Renal failure EXAM: RENAL / URINARY TRACT ULTRASOUND COMPLETE COMPARISON:  10/05/2020 FINDINGS: Right Kidney: Renal measurements: 9.8 x 5.4 x 5.5 cm = volume: 151 mL. Echogenicity within normal limits. No mass or hydronephrosis visualized. Left Kidney: Renal measurements: 11.0 x 4.9 x 3.3 cm = volume: 150 mL. Echogenicity within normal limits. No mass. Mild left hydronephrosis. Bladder: Appears normal for degree of bladder distention. Other: Ascites in the low abdomen. IMPRESSION: 1. Mild left hydronephrosis. No calculi or other obstructing etiology identified. 2. Ascites. Electronically Signed   By: Eddie Candle M.D.   On: 10/19/2020 19:05   US Paracentesis  Result Date: 10/19/2020 INDICATION: Patient with history of peritoneal carcinomatosis with recurrent ascites. Request is for therapeutic paracentesis. EXAM: ULTRASOUND GUIDED THERAPEUTIC PARACENTESIS MEDICATIONS: LIDOCAINE 1% 10 ML COMPLICATIONS: None immediate. PROCEDURE: Informed written consent was obtained from the patient after a discussion of the risks, benefits and alternatives to treatment. A timeout was performed prior  to the initiation of the procedure. Initial ultrasound scanning demonstrates a large amount of ascites within the right lower abdominal quadrant. The right lower abdomen was prepped and draped in the usual sterile fashion. 1% lidocaine was used for local anesthesia. Following this, a 19 gauge, 7-cm, Yueh catheter was introduced. An ultrasound image was saved for documentation purposes. The paracentesis was performed. The catheter was removed and a dressing was applied. The patient tolerated the procedure well without immediate post procedural complication. FINDINGS: A total of approximately 4 L of straw-colored fluid was removed. IMPRESSION: Successful ultrasound-guided therapeutic paracentesis yielding 4  liters of peritoneal fluid. Read by: Rushie Nyhan, NP Electronically Signed   By: Michaelle Birks M.D.   On: 10/19/2020 16:52   US Paracentesis  Result Date: 10/15/2020 INDICATION: Peritoneal carcinomatosis EXAM: ULTRASOUND GUIDED  PARACENTESIS MEDICATIONS: Lidocaine 1% subcutaneous COMPLICATIONS: None immediate PROCEDURE: Informed written consent was obtained from the patient after a discussion of the risks, benefits and alternatives to treatment. A timeout was performed prior to the initiation of the procedure. Initial ultrasound scanning demonstrates a moderate amount of ascites within the right lower abdominal quadrant. The right lower abdomen was prepped and draped in the usual sterile fashion. 1% lidocaine was used for local anesthesia. Following this, a 6 Fr Safe-T-Centesis catheter was introduced. An ultrasound image was saved for documentation purposes. The paracentesis was performed. The catheter was removed and a dressing was applied. The patient tolerated the procedure well without immediate post procedural complication. Patient received post-procedure intravenous albumin; see nursing notes for details. FINDINGS: A total of approximately 4.8 L of  fluid was removed. IMPRESSION: Successful ultrasound-guided paracentesis yielding 4.8 liters of peritoneal fluid. Electronically Signed   By: Lucrezia Europe M.D.   On: 10/15/2020 15:19   US Paracentesis  Result Date: 10/12/2020 INDICATION: Abdominal distention secondary to recurrent malignant ascites. Request for diagnostic and therapeutic paracentesis. EXAM: ULTRASOUND GUIDED RIGHT LOWER QUADRANT PARACENTESIS MEDICATIONS: 1% plain lidocaine, 5 mL COMPLICATIONS: None immediate. PROCEDURE: Informed written consent was obtained from the patient after a discussion of the risks, benefits and alternatives to treatment. A timeout was performed prior to the initiation of the procedure. Initial ultrasound scanning demonstrates a large amount of ascites within  the right lower abdominal quadrant. The right lower abdomen was prepped and draped in the usual sterile fashion. 1% lidocaine was used for local anesthesia. Following this, a 19 gauge, 7-cm, Yueh catheter was introduced. An ultrasound image was saved for documentation purposes. The paracentesis was performed. The catheter was removed and a dressing was applied. The patient tolerated the procedure well without immediate post procedural complication. FINDINGS: A total of approximately 5.3 L of clear yellow fluid was removed. Samples were sent to the laboratory as requested by the clinical team. IMPRESSION: Successful ultrasound-guided paracentesis yielding 5.3 liters of peritoneal fluid. Read by: Ascencion Dike PA-C Electronically Signed   By: Corrie Mckusick D.O.   On: 10/12/2020 13:42   DG CHEST PORT 1 VIEW  Result Date: 10/22/2020 CLINICAL DATA:  Shock EXAM: PORTABLE CHEST 1 VIEW COMPARISON:  10/05/2020 FINDINGS: Extensive airspace disease in the left lower lung with some volume loss. Stable heart size and mediastinal contours. Interval porta catheter with tip at the SVC. IMPRESSION: Pneumonia the left lung base. Electronically Signed   By: Monte Fantasia M.D.   On: 10/22/2020 04:20   DG Abd 2 Views  Result Date: 10/12/2020 CLINICAL DATA:  Ileus.  Abdominal pain.  Distension. EXAM: ABDOMEN - 2 VIEW COMPARISON:  Two-view  abdomen 10/10/2020 FINDINGS: Slightly dilated small bowel again noted in the left abdomen without significant change. Gas is present in the colon. No definite free air present on supine images. Asymmetric left basilar airspace disease is present. IMPRESSION: 1. Stable ileus pattern. 2. Asymmetric left basilar airspace disease. Pneumonia is not excluded. Electronically Signed   By: San Morelle M.D.   On: 10/12/2020 11:30   DG Abd Portable 1V  Result Date: 10/30/2020 CLINICAL DATA:  Nausea, constipation, indwelling peritoneal drain EXAM: PORTABLE ABDOMEN - 1 VIEW COMPARISON:   10/22/2020 FINDINGS: Supine frontal views of the abdomen and pelvis are obtained. Peritoneal catheter is seen within the right mid abdomen. Distended gas-filled loops of small bowel are seen within the left mid abdomen measuring up to 4 cm in diameter. Gas and stool are seen throughout the colon to the level of the rectum. No significant fecal retention. No abdominal masses or abnormal calcifications. IMPRESSION: 1. Gaseous distention of the small bowel, without evidence of high-grade obstruction. This could reflect ileus. 2. No significant fecal retention. 3. Indwelling peritoneal drainage catheter as above. Electronically Signed   By: Randa Ngo M.D.   On: 10/30/2020 19:33   DG Abd Portable 1V  Result Date: 10/20/2020 CLINICAL DATA:  Follow-up ileus. EXAM: PORTABLE ABDOMEN - 1 VIEW COMPARISON:  Abdominal x-ray 10/17/2020. FINDINGS: There are no dilated large or small bowel loops identified. Residual contrast is seen within the right colon. Stomach is moderately distended. There is no free intraperitoneal air on this supine view. No suspicious calcifications are identified. No acute fractures are seen. IMPRESSION: Nonobstructive, nonspecific bowel gas pattern. Electronically Signed   By: Ronney Asters M.D.   On: 10/20/2020 15:18   DG Abd Portable 1V  Result Date: 10/17/2020 CLINICAL DATA:  Abdominal distension, abdominal pain. History of pancreatitis. EXAM: PORTABLE ABDOMEN - 1 VIEW COMPARISON:  Plain films of the abdomen dated 10/15/2020 an 10/09/2020. FINDINGS: No dilated large or small bowel loops are seen. No evidence of free intraperitoneal air is seen. Ill-defined airspace opacity at the LEFT lung base, likely atelectasis. Degenerative spondylosis of the slightly scoliotic thoracolumbar spine, mild to moderate in degree. No acute-appearing osseous abnormality. IMPRESSION: 1. Nonobstructive bowel gas pattern. 2. Probable atelectasis at the LEFT lung base. Electronically Signed   By: Franki Cabot  M.D.   On: 10/17/2020 10:58   DG Abd Portable 1V  Result Date: 10/15/2020 CLINICAL DATA:  Abdominal pain EXAM: PORTABLE ABDOMEN - 1 VIEW COMPARISON:  Radiograph 10/12/2020 FINDINGS: Decreased distention of small bowel in the left hemiabdomen. There is no acute osseous abnormality. Mild right hip degenerative changes. IMPRESSION: Decreased distention of small bowel in the left hemiabdomen. Electronically Signed   By: Maurine Simmering M.D.   On: 10/15/2020 12:35   IR IMAGE GUIDED DRAINAGE PERCUT CATH  PERITONEAL RETROPERIT  Result Date: 10/23/2020 CLINICAL DATA:  Peritoneal carcinomatosis, refractory malignant ascites and need for tunneled peritoneal drainage catheter. EXAM: INSERTION OF TUNNELED PERITONEAL DRAINAGE CATHETER ANESTHESIA/SEDATION: Moderate (conscious) sedation was employed during this procedure. A total of Versed 0.5 mg and Fentanyl 50 mcg was administered intravenously. Moderate Sedation Time: 21 minutes. The patient's level of consciousness and vital signs were monitored continuously by radiology nursing throughout the procedure under my direct supervision. MEDICATIONS: 2 g IV Ancef. Antibiotic was administered in an appropriate time interval for the procedure. FLUOROSCOPY TIME:  6 seconds.  1.0 mGy. PROCEDURE: The procedure, risks, benefits, and alternatives were explained to the patient. Questions regarding the procedure were encouraged and answered.  The patient understands and consents to the procedure. A time-out was performed prior to initiating the procedure. The right abdominal wall was prepped with chlorhexidine in a sterile fashion, and a sterile drape was applied covering the operative field. A sterile gown and sterile gloves were used for the procedure. Local anesthesia was provided with 1% Lidocaine. Ultrasound image documentation was performed. Fluoroscopy during the procedure and fluoroscopic spot radiograph confirms appropriate catheter position. After creating a small skin incision,  a 19 gauge needle was advanced into the peritoneal cavity under ultrasound guidance. A guide wire was then advanced under fluoroscopy into the peritoneal cavity. Peritoneal access was dilated serially and a 16-French peel-away sheath placed. A 15.5 French tunneled PleurX catheter was placed. This was tunneled from an incision 5 cm below the peritoneal access to the access site. The catheter was advanced through the peel-away sheath. The sheath was then removed. Final catheter positioning was confirmed with a fluoroscopic spot image. The peritoneal access incision was closed with subcuticular 4-0 Vicryl. Dermabond was applied to the incision. A Prolene retention suture was applied at the catheter exit site. Large volume paracentesis was performed through the new catheter utilizing drainage bottles. COMPLICATIONS: None. FINDINGS: The catheter was placed via the right abdominal wall. Catheter course is to the left side of the peritoneal cavity. Approximately 4.8 liters of ascites was able to be removed after catheter placement. IMPRESSION: Placement of tunneled peritoneal drainage catheter via right abdominal approach. 4.8 liters of ascites was removed today after catheter placement. Electronically Signed   By: Aletta Edouard M.D.   On: 10/23/2020 10:35   IR IMAGING GUIDED PORT INSERTION  Result Date: 10/14/2020 CLINICAL DATA:  Primary peritoneal carcinomatosis EXAM: TUNNELED PORT CATHETER PLACEMENT WITH ULTRASOUND AND FLUOROSCOPIC GUIDANCE FLUOROSCOPY TIME:  84 seconds; 38 mGy ANESTHESIA/SEDATION: Intravenous Fentanyl 50mg and Versed 1.'5mg'$  were administered as conscious sedation during continuous monitoring of the patient's level of consciousness and physiological / cardiorespiratory status by the radiology RN, with a total moderate sedation time of 14 minutes. TECHNIQUE: The procedure, risks, benefits, and alternatives were explained to the patient. Questions regarding the procedure were encouraged and  answered. The patient understands and consents to the procedure. Patency of the right IJ vein was confirmed with ultrasound with image documentation. An appropriate skin site was determined. Skin site was marked. Region was prepped using maximum barrier technique including cap and mask, sterile gown, sterile gloves, large sterile sheet, and Chlorhexidine as cutaneous antisepsis. The region was infiltrated locally with 1% lidocaine. Under real-time ultrasound guidance, the right IJ vein was accessed with a 21 gauge micropuncture needle; the needle tip within the vein was confirmed with ultrasound image documentation. Needle was exchanged over a 018 guidewire for transitional dilator, and vascular measurement was performed. A small incision was made on the right anterior chest wall and a subcutaneous pocket fashioned. The power-injectable port was positioned and its catheter tunneled to the right IJ dermatotomy site. The transitional dilator was exchanged over an Amplatz wire for a peel-away sheath, through which the port catheter, which had been trimmed to the appropriate length, was advanced and positioned under fluoroscopy with its tip at the cavoatrial junction. Spot chest radiograph confirms good catheter position and no pneumothorax. The port was flushed per protocol. The pocket was closed with deep interrupted and subcuticular continuous 3-0 Monocryl sutures. The incisions were covered with Dermabond then covered with a sterile dressing. The patient tolerated the procedure well. COMPLICATIONS: COMPLICATIONS None immediate IMPRESSION: Technically successful right IJ power-injectable  port catheter placement. Ready for routine use. Electronically Signed   By: Lucrezia Europe M.D.   On: 10/14/2020 07:59   VAS [place] LOWER EXTREMITY VENOUS (DVT)  Result Date: 11/04/2020  Lower Venous DVT Study Patient Name:  FARIHA IBARRA  Date of Exam:   11/04/2020 Medical Rec #: [id number]       Accession #:    [id number] Date of Birth:  1955/04/27       Patient Gender: F Patient Age:   65 years Exam Location:  [facility] Procedure:      VAS [place] LOWER EXTREMITY VENOUS (DVT) Referring Phys: Hosie Poisson --------------------------------------------------------------------------------  Indications: Swelling.  Risk Factors: Cancer. Limitations: Poor ultrasound/tissue interface. Comparison Study: No prior studies. Performing Technologist: Oliver Hum RVT  Examination Guidelines: A complete evaluation includes B-mode imaging, spectral Doppler, color Doppler, and power Doppler as needed of all accessible portions of each vessel. Bilateral testing is considered an integral part of a complete examination. Limited examinations for reoccurring indications may be performed as noted. The reflux portion of the exam is performed with the patient in reverse Trendelenburg.  +---------+---------------+---------+-----------+----------+--------------+ RIGHT    CompressibilityPhasicitySpontaneityPropertiesThrombus Aging +---------+---------------+---------+-----------+----------+--------------+ CFV      Full           Yes      Yes                                 +---------+---------------+---------+-----------+----------+--------------+ SFJ      Full                                                        +---------+---------------+---------+-----------+----------+--------------+ FV Prox  Full                                                        +---------+---------------+---------+-----------+----------+--------------+ FV Mid   Full                                                        +---------+---------------+---------+-----------+----------+--------------+ FV DistalFull                                                        +---------+---------------+---------+-----------+----------+--------------+ PFV      Full                                                         +---------+---------------+---------+-----------+----------+--------------+ POP      Full           Yes      Yes                                 +---------+---------------+---------+-----------+----------+--------------+  PTV      Full                                                        +---------+---------------+---------+-----------+----------+--------------+ PERO     Full                                                        +---------+---------------+---------+-----------+----------+--------------+   +---------+---------------+---------+-----------+----------+--------------+ LEFT     CompressibilityPhasicitySpontaneityPropertiesThrombus Aging +---------+---------------+---------+-----------+----------+--------------+ CFV      Full           Yes      Yes                                 +---------+---------------+---------+-----------+----------+--------------+ SFJ      Full                                                        +---------+---------------+---------+-----------+----------+--------------+ FV Prox  Full                                                        +---------+---------------+---------+-----------+----------+--------------+ FV Mid   Full                                                        +---------+---------------+---------+-----------+----------+--------------+ FV DistalFull                                                        +---------+---------------+---------+-----------+----------+--------------+ PFV      Full                                                        +---------+---------------+---------+-----------+----------+--------------+ POP      Partial        Yes      Yes                  Acute          +---------+---------------+---------+-----------+----------+--------------+ PTV      Partial                                      Acute           +---------+---------------+---------+-----------+----------+--------------+  PERO     Partial                                      Acute          +---------+---------------+---------+-----------+----------+--------------+     Summary: RIGHT: - There is no evidence of deep vein thrombosis in the lower extremity. However, portions of this examination were limited- see technologist comments above.  - No cystic structure found in the popliteal fossa.  LEFT: - Findings consistent with acute deep vein thrombosis involving the left popliteal vein, left posterior tibial veins, and left peroneal veins. - No cystic structure found in the popliteal fossa.  *See table(s) above for measurements and observations. Electronically signed by Monica Martinez MD on 11/04/2020 at 2:55:53 PM.    Final      Subjective: Doing well this morning, no chest pain, no shortness of breath  Discharge Exam: BP 106/74   Pulse 99   Temp 98 F (36.7 C) (Oral)   Resp 20   Ht '5\' 7"'$  (1.702 m)   Wt 79.2 kg   SpO2 96%   BMI 27.35 kg/m   General: Pt is alert, awake, not in acute distress Cardiovascular: RRR, S1/S2 +, no rubs, no gallops Respiratory: CTA bilaterally, no wheezing, no rhonchi Abdominal: Soft, NT, ND, bowel sounds + Extremities: no edema, no cyanosis    The results of significant diagnostics from this hospitalization (including imaging, microbiology, ancillary and laboratory) are listed below for reference.     Microbiology: No results found for this or any previous visit (from the past 240 hour(s)).   Labs: Basic Metabolic Panel: Recent Labs  Lab 11/04/20 0505 11/05/20 0434 11/06/20 0434 11/08/20 0430 11/09/20 0421  NA 140 134* 139 139 137  K 3.7 3.7 3.9 3.3* 3.2*  CL 113* 109 115* 115* 111  CO2 20* 18* 20* 19* 20*  GLUCOSE 79 125* 113* 83 79  BUN 26* 28* 28* 30* 26*  CREATININE 0.98 1.09* 0.97 0.69 0.91  CALCIUM 8.2* 7.5* 7.5* 7.1* 6.9*   Liver Function Tests: Recent Labs  Lab  11/04/20 0505 11/05/20 0434 11/06/20 0434 11/08/20 0430 11/09/20 0421  AST '16 15 15 25 27  '$ ALT '11 11 11 13 13  '$ ALKPHOS 160* 148* 152* 161* 149*  BILITOT 0.8 0.7 0.5 0.6 0.9  PROT 5.5* 5.3* 5.1* 4.7* 4.6*  ALBUMIN 2.3* 2.1* 2.2* 2.0* 2.0*   CBC: Recent Labs  Lab 11/04/20 0505 11/05/20 0434 11/06/20 0434 11/08/20 0430 11/09/20 0421  WBC 3.1* 2.5* 3.2* 3.2* 2.8*  NEUTROABS  --   --  2.7 2.8 2.6  HGB 10.7* 10.0* 10.0* 10.3* 9.5*  HCT 33.3* 31.4* 31.9* 31.4* 29.2*  MCV 84.3 85.3 85.1 84.2 83.2  PLT 111* 115* 141* 166 171   CBG: No results for input(s): GLUCAP in the last 168 hours. Hgb A1c No results for input(s): HGBA1C in the last 72 hours. Lipid Profile No results for input(s): CHOL, HDL, LDLCALC, TRIG, CHOLHDL, LDLDIRECT in the last 72 hours. Thyroid function studies No results for input(s): TSH, T4TOTAL, T3FREE, THYROIDAB in the last 72 hours.  Invalid input(s): FREET3 Urinalysis    Component Value Date/Time   COLORURINE YELLOW 10/05/2020 2119   APPEARANCEUR CLEAR 10/05/2020 2119   LABSPEC 1.016 10/05/2020 2119   PHURINE 5.0 10/05/2020 2119   GLUCOSEU NEGATIVE 10/05/2020 2119   HGBUR SMALL (A) 10/05/2020 2119   BILIRUBINUR NEGATIVE 10/05/2020 2119  BILIRUBINUR neg 06/22/2015 1147   KETONESUR 5 (A) 10/05/2020 2119   PROTEINUR NEGATIVE 10/05/2020 2119   UROBILINOGEN 1.0 06/22/2015 1147   NITRITE NEGATIVE 10/05/2020 2119   LEUKOCYTESUR TRACE (A) 10/05/2020 2119    FURTHER DISCHARGE INSTRUCTIONS:   Get Medicines reviewed and adjusted: Please take all your medications with you for your next visit with your Primary MD   Laboratory/radiological data: Please request your Primary MD to go over all hospital tests and procedure/radiological results at the follow up, please ask your Primary MD to get all Hospital records sent to his/her office.   In some cases, they will be blood work, cultures and biopsy results pending at the time of your discharge. Please  request that your primary care M.D. goes through all the records of your hospital data and follows up on these results.   Also Note the following: If you experience worsening of your admission symptoms, develop shortness of breath, life threatening emergency, suicidal or homicidal thoughts you must seek medical attention immediately by calling 911 or calling your MD immediately  if symptoms less severe.   You must read complete instructions/literature along with all the possible adverse reactions/side effects for all the Medicines you take and that have been prescribed to you. Take any new Medicines after you have completely understood and accpet all the possible adverse reactions/side effects.    Do not drive when taking Pain medications or sleeping medications (Benzodaizepines)   Do not take more than prescribed Pain, Sleep and Anxiety Medications. It is not advisable to combine anxiety,sleep and pain medications without talking with your primary care practitioner   Special Instructions: If you have smoked or chewed Tobacco  in the last 2 yrs please stop smoking, stop any regular Alcohol  and or any Recreational drug use.   Wear Seat belts while driving.   Please note: You were cared for by a hospitalist during your hospital stay. Once you are discharged, your primary care physician will handle any further medical issues. Please note that NO REFILLS for any discharge medications will be authorized once you are discharged, as it is imperative that you return to your primary care physician (or establish a relationship with a primary care physician if you do not have one) for your post hospital discharge needs so that they can reassess your need for medications and monitor your lab values.  Time coordinating discharge: 40 minutes  SIGNED:  Marzetta Board, MD, PhD 11/09/2020, 10:52 AM

## 2020-11-09 NOTE — TOC Transition Note (Signed)
Transition of Care [facility]) - CM/SW Discharge Note   Patient Details  Name: Beth Sanchez MRN: [id number] Date of Birth: 06/12/1955  [facility] of [facility] CM/SW Contact:  Lynnell Catalan, RN Phone Number: 11/09/2020, 12:38 PM   Clinical Narrative:    Pt to dc to [facility] today. [facility] to be contacted for transport. Pt to go to room 3234 and RN to call report to [phone]. Yellow DNR on chart for dc. Box of 10 Pleurx drainage canisters to go with pt to the SNF.   Final next level of care: Skilled Nursing Facility Barriers to Discharge: Continued Medical Work up   Patient Goals and CMS Choice Patient states their goals for this hospitalization and ongoing recovery are:: To get better and return home. CMS Medicare.gov Compare Post Acute Care list provided to:: Patient Choice offered to / list presented to : Patient  Discharge Placement              Patient chooses bed at: [facility] Patient to be transferred to facility by: [facility] Name of family member notified: [name] (sister) Patient and family notified of of transfer: 11/09/20  Discharge Plan and Services   Discharge Planning Services: CM Consult              Readmission Risk Interventions Readmission Risk Prevention Plan 11/04/2020  Transportation Screening Complete  Medication Review [employer]) Complete  HRI or Home Care Consult Complete  SW Recovery Care/Counseling Consult Complete  Palliative Care Screening Complete  Skilled Nursing Facility Complete  Some recent data might be hidden

## 2020-11-09 NOTE — Progress Notes (Signed)
Report given to [facility], [place], at [facility] at 1420.

## 2020-11-09 NOTE — Progress Notes (Signed)
Attempted call to give report but the nurse npt available. Will call back.

## 2020-11-10 ENCOUNTER — Other Ambulatory Visit: Payer: Self-pay | Admitting: Oncology

## 2020-11-10 DIAGNOSIS — C482 Malignant neoplasm of peritoneum, unspecified: Secondary | ICD-10-CM

## 2020-11-11 ENCOUNTER — Encounter: Payer: Self-pay | Admitting: *Deleted

## 2020-11-11 NOTE — Progress Notes (Signed)
Patient has been discharged from the hospital. Dr Marin Olp would like her scheduled next week. Patient scheduled for 11/17/2020.  He initiated treatment in the hospital and has had 2 cycles.   Oncology Nurse Navigator Documentation  Oncology Nurse Navigator Flowsheets 11/11/2020  Phase of Treatment Chemo  Chemotherapy Actual Start Date: 10/14/2020  Navigator Follow Up Date: 11/17/2020  Navigator Follow Up Reason: New Patient Appointment  Navigator Location [facility]  Navigator Encounter Type Appt/Treatment Plan Review  Treatment Phase Active Tx  Barriers/Navigation Needs Coordination of Care  Interventions Coordination of Care  Acuity Level 2-Minimal Needs (1-2 Barriers Identified)  Coordination of Care Appts  Time Spent with Patient 15

## 2020-11-16 ENCOUNTER — Other Ambulatory Visit: Payer: Self-pay | Admitting: Family Medicine

## 2020-11-17 ENCOUNTER — Inpatient Hospital Stay: Payer: Medicare Other

## 2020-11-17 ENCOUNTER — Inpatient Hospital Stay: Payer: Medicare Other | Admitting: Hematology & Oncology

## 2020-11-21 DEATH — deceased

## 2020-11-22 ENCOUNTER — Other Ambulatory Visit: Payer: Self-pay | Admitting: Family Medicine

## 2020-11-22 DIAGNOSIS — E1165 Type 2 diabetes mellitus with hyperglycemia: Secondary | ICD-10-CM

## 2020-11-22 DIAGNOSIS — I10 Essential (primary) hypertension: Secondary | ICD-10-CM

## 2021-03-15 ENCOUNTER — Ambulatory Visit: Payer: Medicare Other | Admitting: Internal Medicine

## 2021-11-09 IMAGING — DX DG ABDOMEN 1V
1 series · 1 of 1 positions shown · non-contrast
Comparison: Two days ago

CLINICAL DATA: [place].  Abdominal pain

EXAM:
ABDOMEN - 1 VIEW

[abdomen kub]
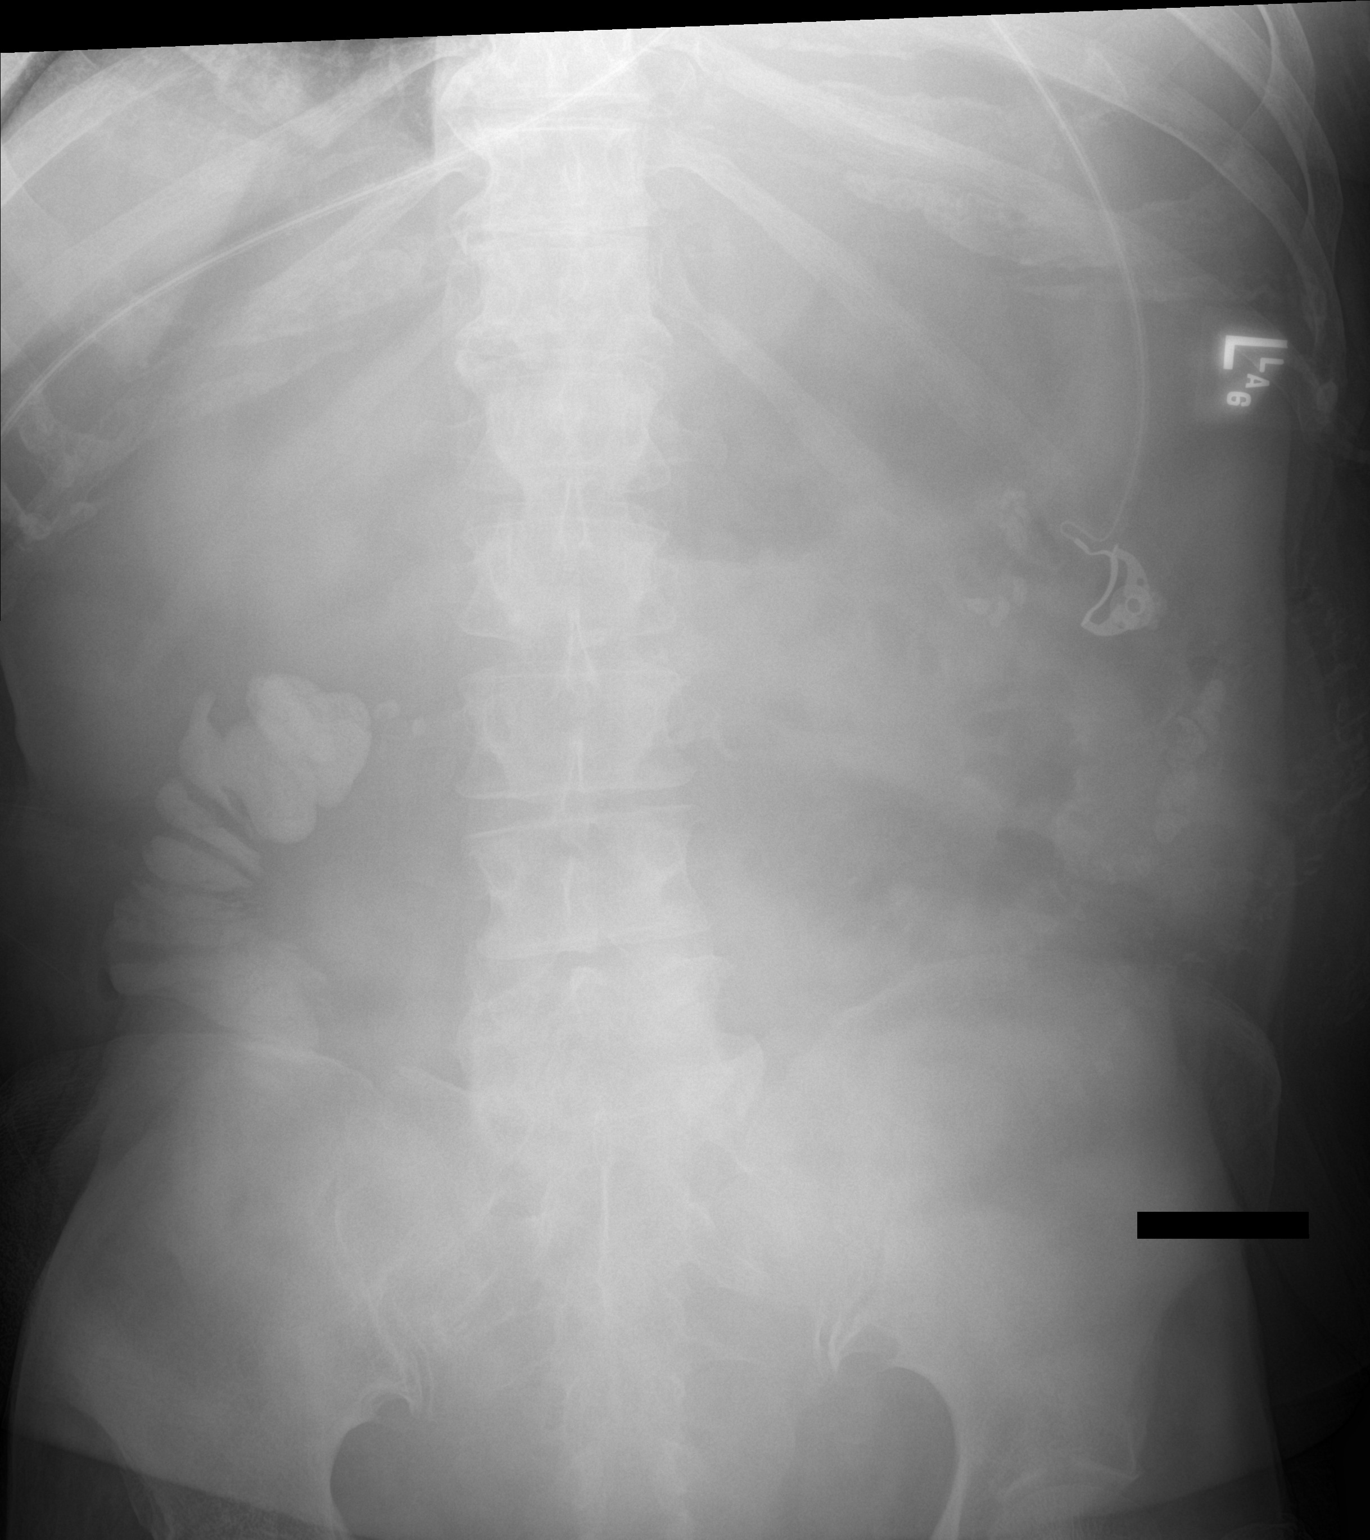

[1 of 1 positions shown; findings below may reference images not displayed]

FINDINGS: Unchanged enteric contrast in the ascending more than descending
colon. Mild gaseous distention of the stomach. No concerning mass
effect or calcification.
IMPRESSION: Nonobstructive bowel gas pattern that is unchanged from 2 days ago.
No progression of enteric contrast.

## 2021-11-09 IMAGING — DX DG CHEST 1V PORT
1 series · 1 of 1 positions shown · non-contrast
Comparison: 10/05/2020

CLINICAL DATA: Shock

EXAM:
PORTABLE CHEST 1 VIEW

[chest ap]
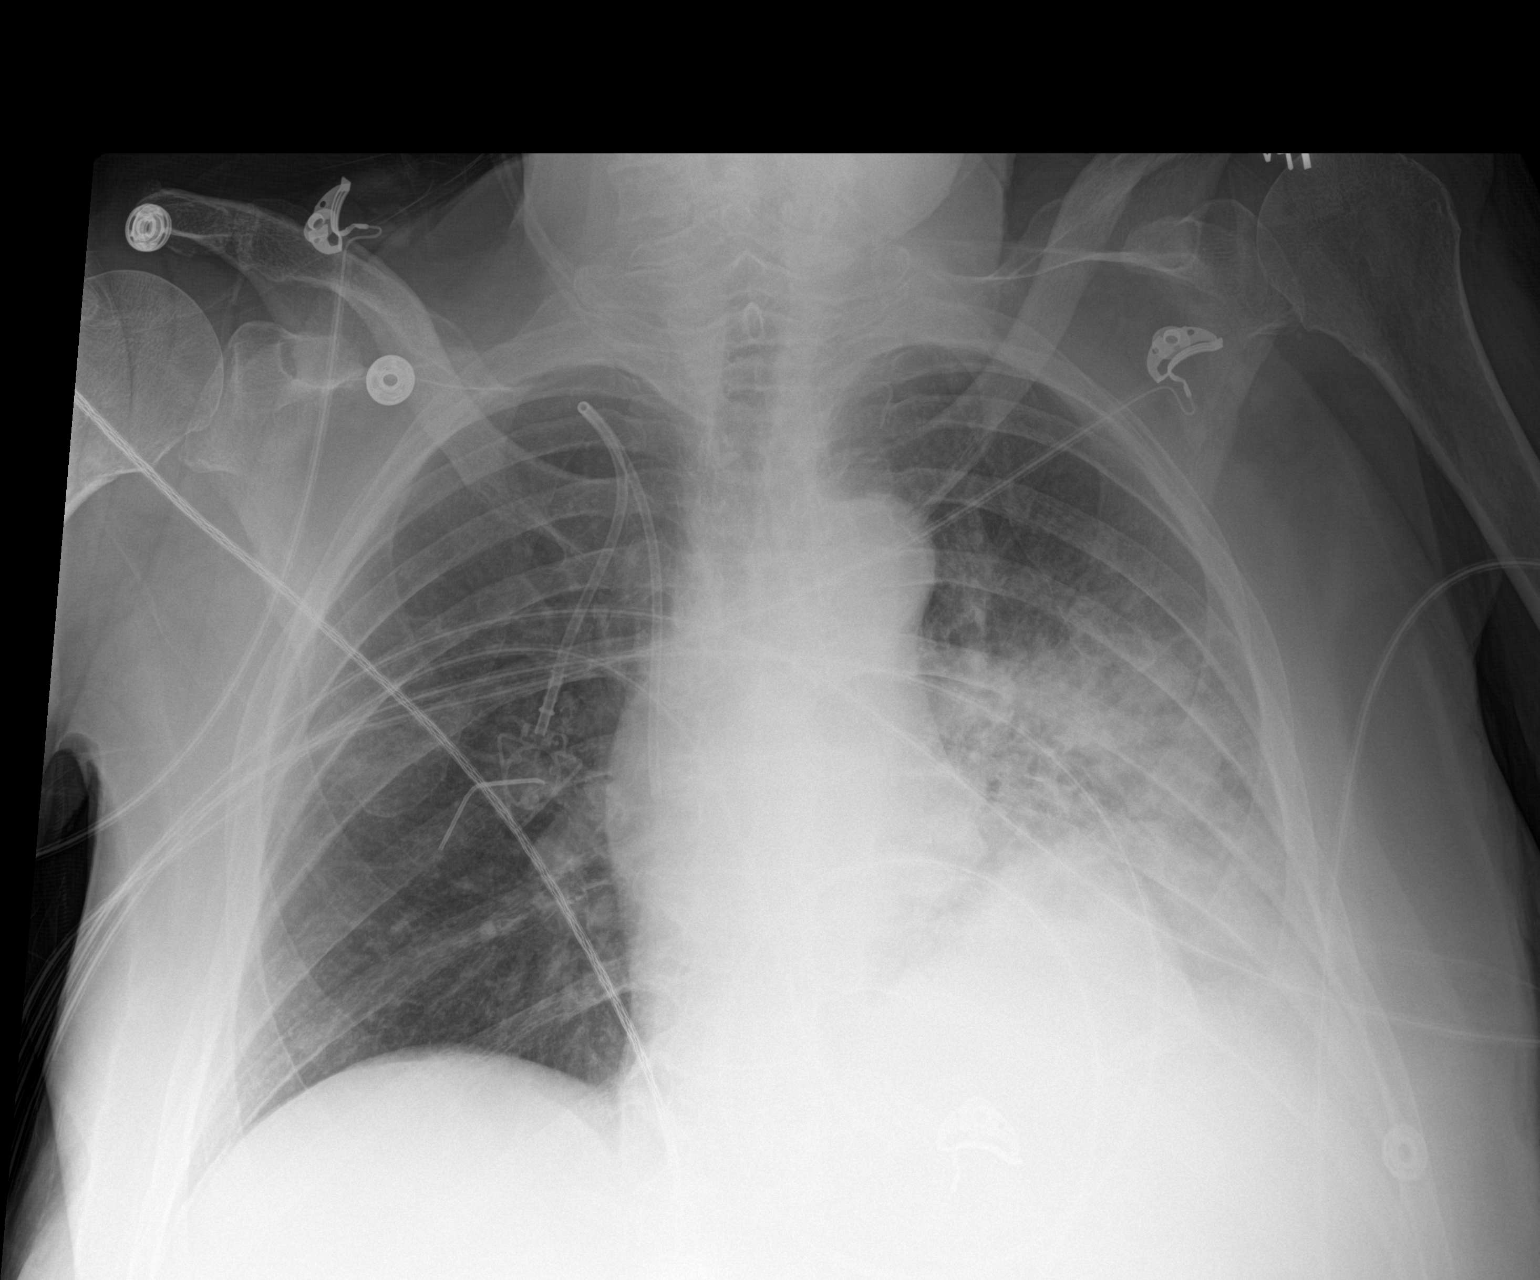

[1 of 1 positions shown; findings below may reference images not displayed]

FINDINGS: Extensive airspace disease in the left lower lung with some volume
loss. Stable heart size and mediastinal contours. Interval porta
catheter with tip at the SVC.
IMPRESSION: Pneumonia the left lung base.
